# Patient Record
Sex: Male | Born: 1935 | Race: White | Hispanic: No | Marital: Married | Smoking: Former smoker
Health system: Southern US, Community
[De-identification: ages and names within clinical notes are randomized; demographics above are authoritative.]

## PROBLEM LIST (undated history)

## (undated) DIAGNOSIS — E079 Disorder of thyroid, unspecified: Secondary | ICD-10-CM

## (undated) DIAGNOSIS — E538 Deficiency of other specified B group vitamins: Secondary | ICD-10-CM

## (undated) DIAGNOSIS — R269 Unspecified abnormalities of gait and mobility: Secondary | ICD-10-CM

## (undated) DIAGNOSIS — G20 Parkinson's disease: Secondary | ICD-10-CM

## (undated) DIAGNOSIS — M546 Pain in thoracic spine: Secondary | ICD-10-CM

## (undated) DIAGNOSIS — M810 Age-related osteoporosis without current pathological fracture: Secondary | ICD-10-CM

## (undated) DIAGNOSIS — M25519 Pain in unspecified shoulder: Secondary | ICD-10-CM

## (undated) DIAGNOSIS — K5792 Diverticulitis of intestine, part unspecified, without perforation or abscess without bleeding: Secondary | ICD-10-CM

## (undated) DIAGNOSIS — M25561 Pain in right knee: Secondary | ICD-10-CM

## (undated) DIAGNOSIS — E785 Hyperlipidemia, unspecified: Secondary | ICD-10-CM

## (undated) DIAGNOSIS — F32A Depression, unspecified: Secondary | ICD-10-CM

## (undated) DIAGNOSIS — M542 Cervicalgia: Secondary | ICD-10-CM

## (undated) DIAGNOSIS — G2581 Restless legs syndrome: Secondary | ICD-10-CM

## (undated) DIAGNOSIS — G20C Parkinsonism, unspecified: Secondary | ICD-10-CM

## (undated) DIAGNOSIS — M1990 Unspecified osteoarthritis, unspecified site: Secondary | ICD-10-CM

## (undated) DIAGNOSIS — F329 Major depressive disorder, single episode, unspecified: Secondary | ICD-10-CM

## (undated) DIAGNOSIS — E559 Vitamin D deficiency, unspecified: Secondary | ICD-10-CM

## (undated) DIAGNOSIS — R413 Other amnesia: Secondary | ICD-10-CM

## (undated) DIAGNOSIS — I452 Bifascicular block: Secondary | ICD-10-CM

## (undated) DIAGNOSIS — R296 Repeated falls: Secondary | ICD-10-CM

## (undated) DIAGNOSIS — M545 Low back pain, unspecified: Secondary | ICD-10-CM

## (undated) DIAGNOSIS — I10 Essential (primary) hypertension: Secondary | ICD-10-CM

## (undated) DIAGNOSIS — R1310 Dysphagia, unspecified: Secondary | ICD-10-CM

## (undated) DIAGNOSIS — E039 Hypothyroidism, unspecified: Secondary | ICD-10-CM

## (undated) DIAGNOSIS — E1143 Type 2 diabetes mellitus with diabetic autonomic (poly)neuropathy: Secondary | ICD-10-CM

## (undated) DIAGNOSIS — R42 Dizziness and giddiness: Secondary | ICD-10-CM

## (undated) DIAGNOSIS — H9193 Unspecified hearing loss, bilateral: Secondary | ICD-10-CM

## (undated) HISTORY — DX: Low back pain, unspecified: M54.50

## (undated) HISTORY — DX: Cervicalgia: M54.2

## (undated) HISTORY — DX: Pain in unspecified shoulder: M25.519

## (undated) HISTORY — DX: Essential (primary) hypertension: I10

## (undated) HISTORY — DX: Diverticulitis of intestine, part unspecified, without perforation or abscess without bleeding: K57.92

## (undated) HISTORY — DX: Dizziness and giddiness: R42

## (undated) HISTORY — DX: Hyperlipidemia, unspecified: E78.5

## (undated) HISTORY — DX: Unspecified osteoarthritis, unspecified site: M19.90

## (undated) HISTORY — DX: Parkinsonism, unspecified: G20.C

## (undated) HISTORY — DX: Major depressive disorder, single episode, unspecified: F32.9

## (undated) HISTORY — DX: Low back pain: M54.5

## (undated) HISTORY — DX: Hypothyroidism, unspecified: E03.9

## (undated) HISTORY — DX: Restless legs syndrome: G25.81

## (undated) HISTORY — DX: Parkinson's disease: G20

## (undated) HISTORY — DX: Unspecified abnormalities of gait and mobility: R26.9

## (undated) HISTORY — DX: Repeated falls: R29.6

## (undated) HISTORY — DX: Deficiency of other specified B group vitamins: E53.8

## (undated) HISTORY — DX: Type 2 diabetes mellitus with diabetic autonomic (poly)neuropathy: E11.43

## (undated) HISTORY — DX: Unspecified hearing loss, bilateral: H91.93

## (undated) HISTORY — DX: Dysphagia, unspecified: R13.10

## (undated) HISTORY — DX: Pain in right knee: M25.561

## (undated) HISTORY — DX: Depression, unspecified: F32.A

## (undated) HISTORY — DX: Pain in thoracic spine: M54.6

## (undated) HISTORY — DX: Other amnesia: R41.3

---

## 2004-01-10 ENCOUNTER — Ambulatory Visit: Payer: Self-pay | Admitting: Family Medicine

## 2004-09-11 ENCOUNTER — Ambulatory Visit: Payer: Self-pay | Admitting: Family Medicine

## 2004-09-25 ENCOUNTER — Ambulatory Visit: Payer: Self-pay | Admitting: Family Medicine

## 2004-11-04 ENCOUNTER — Ambulatory Visit: Payer: Self-pay | Admitting: Family Medicine

## 2004-11-25 ENCOUNTER — Ambulatory Visit: Payer: Self-pay | Admitting: Family Medicine

## 2005-03-04 ENCOUNTER — Ambulatory Visit: Payer: Self-pay | Admitting: Family Medicine

## 2005-03-11 ENCOUNTER — Ambulatory Visit: Payer: Self-pay | Admitting: Family Medicine

## 2005-06-10 ENCOUNTER — Ambulatory Visit: Payer: Self-pay | Admitting: Family Medicine

## 2005-09-30 ENCOUNTER — Ambulatory Visit: Payer: Self-pay | Admitting: Family Medicine

## 2005-11-25 ENCOUNTER — Ambulatory Visit: Payer: Self-pay | Admitting: Family Medicine

## 2005-12-17 ENCOUNTER — Ambulatory Visit: Payer: Self-pay | Admitting: Family Medicine

## 2006-12-13 ENCOUNTER — Telehealth: Payer: Self-pay | Admitting: Family Medicine

## 2006-12-16 ENCOUNTER — Ambulatory Visit: Payer: Self-pay | Admitting: Family Medicine

## 2006-12-16 DIAGNOSIS — T50995A Adverse effect of other drugs, medicaments and biological substances, initial encounter: Secondary | ICD-10-CM

## 2006-12-16 DIAGNOSIS — F528 Other sexual dysfunction not due to a substance or known physiological condition: Secondary | ICD-10-CM

## 2006-12-16 DIAGNOSIS — N390 Urinary tract infection, site not specified: Secondary | ICD-10-CM

## 2006-12-16 DIAGNOSIS — I10 Essential (primary) hypertension: Secondary | ICD-10-CM

## 2006-12-16 DIAGNOSIS — E119 Type 2 diabetes mellitus without complications: Secondary | ICD-10-CM | POA: Insufficient documentation

## 2006-12-16 DIAGNOSIS — D649 Anemia, unspecified: Secondary | ICD-10-CM

## 2006-12-16 DIAGNOSIS — M19019 Primary osteoarthritis, unspecified shoulder: Secondary | ICD-10-CM | POA: Insufficient documentation

## 2006-12-16 LAB — CONVERTED CEMR LAB
Nitrite: NEGATIVE
Specific Gravity, Urine: 1.02

## 2006-12-21 LAB — CONVERTED CEMR LAB
AST: 23 units/L (ref 0–37)
Alkaline Phosphatase: 70 units/L (ref 39–117)
Basophils Absolute: 0 10*3/uL (ref 0.0–0.1)
Basophils Relative: 0.2 % (ref 0.0–1.0)
Bilirubin, Direct: 0.2 mg/dL (ref 0.0–0.3)
CO2: 31 meq/L (ref 19–32)
Chloride: 102 meq/L (ref 96–112)
Creatinine, Ser: 0.7 mg/dL (ref 0.4–1.5)
Eosinophils Absolute: 0.2 10*3/uL (ref 0.0–0.6)
GFR calc Af Amer: 142 mL/min
HCT: 46.6 % (ref 39.0–52.0)
LDL Cholesterol: 74 mg/dL (ref 0–99)
Lymphocytes Relative: 34.5 % (ref 12.0–46.0)
MCHC: 34.9 g/dL (ref 30.0–36.0)
Monocytes Absolute: 0.9 10*3/uL — ABNORMAL HIGH (ref 0.2–0.7)
Monocytes Relative: 9.5 % (ref 3.0–11.0)
PSA: 0.4 ng/mL (ref 0.10–4.00)
Platelets: 243 10*3/uL (ref 150–400)
Potassium: 3.5 meq/L (ref 3.5–5.1)
Sodium: 142 meq/L (ref 135–145)
TSH: 1.35 microintl units/mL (ref 0.35–5.50)
Triglycerides: 157 mg/dL — ABNORMAL HIGH (ref 0–149)
VLDL: 31 mg/dL (ref 0–40)

## 2006-12-29 ENCOUNTER — Ambulatory Visit: Payer: Self-pay | Admitting: Family Medicine

## 2006-12-30 ENCOUNTER — Encounter: Payer: Self-pay | Admitting: Family Medicine

## 2007-02-22 ENCOUNTER — Telehealth: Payer: Self-pay | Admitting: Family Medicine

## 2007-02-23 ENCOUNTER — Telehealth (INDEPENDENT_AMBULATORY_CARE_PROVIDER_SITE_OTHER): Payer: Self-pay | Admitting: *Deleted

## 2007-05-18 ENCOUNTER — Ambulatory Visit: Payer: Self-pay | Admitting: Family Medicine

## 2007-05-18 DIAGNOSIS — S339XXA Sprain of unspecified parts of lumbar spine and pelvis, initial encounter: Secondary | ICD-10-CM | POA: Insufficient documentation

## 2007-05-18 DIAGNOSIS — N410 Acute prostatitis: Secondary | ICD-10-CM | POA: Insufficient documentation

## 2007-05-18 DIAGNOSIS — S335XXA Sprain of ligaments of lumbar spine, initial encounter: Secondary | ICD-10-CM

## 2007-05-18 LAB — CONVERTED CEMR LAB
Nitrite: NEGATIVE
Urobilinogen, UA: 1

## 2007-06-01 ENCOUNTER — Ambulatory Visit: Payer: Self-pay | Admitting: Family Medicine

## 2007-06-01 DIAGNOSIS — M62838 Other muscle spasm: Secondary | ICD-10-CM

## 2007-06-01 DIAGNOSIS — Z8719 Personal history of other diseases of the digestive system: Secondary | ICD-10-CM | POA: Insufficient documentation

## 2007-06-01 LAB — CONVERTED CEMR LAB
Ketones, urine, test strip: NEGATIVE
Protein, U semiquant: NEGATIVE
Urobilinogen, UA: 0.2
WBC Urine, dipstick: NEGATIVE

## 2007-12-22 ENCOUNTER — Ambulatory Visit: Payer: Self-pay | Admitting: Family Medicine

## 2007-12-22 DIAGNOSIS — E559 Vitamin D deficiency, unspecified: Secondary | ICD-10-CM | POA: Insufficient documentation

## 2008-01-04 ENCOUNTER — Ambulatory Visit: Payer: Self-pay | Admitting: Family Medicine

## 2008-01-04 LAB — CONVERTED CEMR LAB: OCCULT 2: NEGATIVE

## 2008-01-06 ENCOUNTER — Encounter: Payer: Self-pay | Admitting: Family Medicine

## 2008-01-30 ENCOUNTER — Telehealth: Payer: Self-pay | Admitting: Family Medicine

## 2008-02-27 ENCOUNTER — Ambulatory Visit: Payer: Self-pay | Admitting: Family Medicine

## 2008-02-27 LAB — CONVERTED CEMR LAB: Hgb A1c MFr Bld: 6.6 % — ABNORMAL HIGH (ref 4.6–6.0)

## 2008-03-13 ENCOUNTER — Telehealth: Payer: Self-pay | Admitting: Family Medicine

## 2008-04-24 LAB — CONVERTED CEMR LAB: Vit D, 1,25-Dihydroxy: 41 (ref 30–89)

## 2008-10-10 ENCOUNTER — Encounter: Payer: Self-pay | Admitting: Family Medicine

## 2008-12-25 ENCOUNTER — Ambulatory Visit: Payer: Self-pay | Admitting: Family Medicine

## 2008-12-25 DIAGNOSIS — N400 Enlarged prostate without lower urinary tract symptoms: Secondary | ICD-10-CM

## 2008-12-25 DIAGNOSIS — M1710 Unilateral primary osteoarthritis, unspecified knee: Secondary | ICD-10-CM

## 2008-12-25 LAB — CONVERTED CEMR LAB
Glucose, Urine, Semiquant: NEGATIVE
Ketones, urine, test strip: NEGATIVE
Nitrite: NEGATIVE
Protein, U semiquant: NEGATIVE
Urobilinogen, UA: 0.2
WBC Urine, dipstick: NEGATIVE

## 2008-12-31 LAB — CONVERTED CEMR LAB
AST: 24 units/L (ref 0–37)
Albumin: 4.6 g/dL (ref 3.5–5.2)
Alkaline Phosphatase: 61 units/L (ref 39–117)
BUN: 16 mg/dL (ref 6–23)
Calcium: 9.8 mg/dL (ref 8.4–10.5)
Creatinine, Ser: 0.9 mg/dL (ref 0.4–1.5)
HCT: 46 % (ref 39.0–52.0)
HDL: 38.4 mg/dL — ABNORMAL LOW (ref >39.00)
Hemoglobin: 16.3 g/dL (ref 13.0–17.0)
Hgb A1c MFr Bld: 6.7 % — ABNORMAL HIGH (ref 4.6–6.5)
Lymphocytes Relative: 30.7 % (ref 12.0–46.0)
MCHC: 35.3 g/dL (ref 30.0–36.0)
MCV: 96.2 fL (ref 78.0–100.0)
Monocytes Absolute: 0.6 10*3/uL (ref 0.1–1.0)
Monocytes Relative: 9 % (ref 3.0–12.0)
Neutro Abs: 3.9 10*3/uL (ref 1.4–7.7)
Neutrophils Relative %: 58.2 % (ref 43.0–77.0)
PSA: 0.75 ng/mL (ref 0.10–4.00)
Platelets: 207 10*3/uL (ref 150.0–400.0)
Potassium: 3.9 meq/L (ref 3.5–5.1)
RBC: 4.78 M/uL (ref 4.22–5.81)
Sodium: 141 meq/L (ref 135–145)
TSH: 1.26 microintl units/mL (ref 0.35–5.50)
Total CHOL/HDL Ratio: 3
Total Protein: 7.6 g/dL (ref 6.0–8.3)
Triglycerides: 148 mg/dL (ref 0.0–149.0)
VLDL: 29.6 mg/dL (ref 0.0–40.0)

## 2009-01-14 ENCOUNTER — Ambulatory Visit: Payer: Self-pay | Admitting: Family Medicine

## 2009-01-14 LAB — CONVERTED CEMR LAB
OCCULT 2: NEGATIVE
OCCULT 3: NEGATIVE

## 2009-01-30 ENCOUNTER — Encounter: Payer: Self-pay | Admitting: Family Medicine

## 2009-03-21 ENCOUNTER — Ambulatory Visit: Payer: Self-pay | Admitting: Family Medicine

## 2009-03-21 DIAGNOSIS — L259 Unspecified contact dermatitis, unspecified cause: Secondary | ICD-10-CM | POA: Insufficient documentation

## 2009-03-21 LAB — CONVERTED CEMR LAB

## 2009-03-27 ENCOUNTER — Telehealth: Payer: Self-pay | Admitting: Family Medicine

## 2009-04-11 ENCOUNTER — Ambulatory Visit: Payer: Self-pay | Admitting: Family Medicine

## 2009-04-11 DIAGNOSIS — R269 Unspecified abnormalities of gait and mobility: Secondary | ICD-10-CM

## 2009-04-11 DIAGNOSIS — R413 Other amnesia: Secondary | ICD-10-CM | POA: Insufficient documentation

## 2009-04-11 LAB — CONVERTED CEMR LAB: Hemoglobin: 18.2 g/dL

## 2009-04-14 LAB — CONVERTED CEMR LAB
Albumin: 4.1 g/dL (ref 3.5–5.2)
BUN: 20 mg/dL (ref 6–23)
CO2: 30 meq/L (ref 19–32)
Calcium: 9.8 mg/dL (ref 8.4–10.5)
Chloride: 103 meq/L (ref 96–112)
Creatinine, Ser: 0.9 mg/dL (ref 0.4–1.5)
GFR calc non Af Amer: 87.26 mL/min (ref >60)
Sodium: 140 meq/L (ref 135–145)

## 2009-04-18 ENCOUNTER — Encounter: Admission: RE | Admit: 2009-04-18 | Discharge: 2009-04-18 | Payer: Self-pay | Admitting: Family Medicine

## 2009-04-24 ENCOUNTER — Telehealth: Payer: Self-pay | Admitting: Family Medicine

## 2009-05-08 ENCOUNTER — Ambulatory Visit: Payer: Self-pay | Admitting: Family Medicine

## 2009-05-08 ENCOUNTER — Telehealth: Payer: Self-pay | Admitting: Family Medicine

## 2009-05-08 DIAGNOSIS — R351 Nocturia: Secondary | ICD-10-CM

## 2009-05-30 ENCOUNTER — Telehealth: Payer: Self-pay | Admitting: Family Medicine

## 2009-06-13 ENCOUNTER — Encounter: Payer: Self-pay | Admitting: Family Medicine

## 2009-06-25 ENCOUNTER — Encounter: Payer: Self-pay | Admitting: Family Medicine

## 2009-06-25 ENCOUNTER — Encounter: Admission: RE | Admit: 2009-06-25 | Discharge: 2009-09-05 | Payer: Self-pay | Admitting: Neurology

## 2009-06-26 ENCOUNTER — Encounter: Admission: RE | Admit: 2009-06-26 | Discharge: 2009-06-26 | Payer: Self-pay | Admitting: Neurology

## 2009-07-08 ENCOUNTER — Encounter: Payer: Self-pay | Admitting: Family Medicine

## 2009-08-01 ENCOUNTER — Encounter: Admission: RE | Admit: 2009-08-01 | Discharge: 2009-08-01 | Payer: Self-pay | Admitting: Neurology

## 2009-08-13 ENCOUNTER — Ambulatory Visit (HOSPITAL_COMMUNITY): Admission: RE | Admit: 2009-08-13 | Discharge: 2009-08-13 | Payer: Self-pay | Admitting: Neurology

## 2009-09-05 ENCOUNTER — Encounter: Payer: Self-pay | Admitting: Family Medicine

## 2010-01-20 ENCOUNTER — Ambulatory Visit: Payer: Self-pay | Admitting: Family Medicine

## 2010-01-20 LAB — CONVERTED CEMR LAB: HDL goal, serum: 40 mg/dL

## 2010-01-21 ENCOUNTER — Encounter: Payer: Self-pay | Admitting: Family Medicine

## 2010-01-21 LAB — CONVERTED CEMR LAB
ALT: 19 units/L (ref 0–53)
AST: 22 units/L (ref 0–37)
Albumin: 4.3 g/dL (ref 3.5–5.2)
Bilirubin, Direct: 0.2 mg/dL (ref 0.0–0.3)
Chloride: 103 meq/L (ref 96–112)
GFR calc non Af Amer: 116.38 mL/min (ref >60)
Glucose, Bld: 132 mg/dL — ABNORMAL HIGH (ref 70–99)
Hgb A1c MFr Bld: 6.6 % — ABNORMAL HIGH (ref 4.6–6.5)
LDL Cholesterol: 57 mg/dL (ref 0–99)
Sodium: 143 meq/L (ref 135–145)
Total CHOL/HDL Ratio: 3
Total Protein: 6.9 g/dL (ref 6.0–8.3)
VLDL: 19 mg/dL (ref 0.0–40.0)

## 2010-02-23 HISTORY — PX: CATARACT EXTRACTION: SUR2

## 2010-03-23 LAB — CONVERTED CEMR LAB
Albumin: 4.2 g/dL (ref 3.5–5.2)
Alkaline Phosphatase: 67 units/L (ref 39–117)
Basophils Absolute: 0 10*3/uL (ref 0.0–0.1)
Basophils Relative: 0 % (ref 0.0–3.0)
Bilirubin, Direct: 0.1 mg/dL (ref 0.0–0.3)
CO2: 29 meq/L (ref 19–32)
Calcium: 9.4 mg/dL (ref 8.4–10.5)
Chloride: 102 meq/L (ref 96–112)
Cholesterol: 123 mg/dL (ref 0–200)
Creatinine, Ser: 0.8 mg/dL (ref 0.4–1.5)
Eosinophils Absolute: 0.1 10*3/uL (ref 0.0–0.7)
GFR calc Af Amer: 122 mL/min
GFR calc non Af Amer: 100 mL/min
Glucose, Bld: 160 mg/dL — ABNORMAL HIGH (ref 70–99)
Glucose, Urine, Semiquant: NEGATIVE
HCT: 46.8 % (ref 39.0–52.0)
Hemoglobin: 16.3 g/dL (ref 13.0–17.0)
LDL Cholesterol: 55 mg/dL (ref 0–99)
Lymphocytes Relative: 27.5 % (ref 12.0–46.0)
MCV: 93.6 fL (ref 78.0–100.0)
Monocytes Absolute: 0.6 10*3/uL (ref 0.1–1.0)
Neutrophils Relative %: 63.2 % (ref 43.0–77.0)
Platelets: 193 10*3/uL (ref 150–400)
Protein, U semiquant: NEGATIVE
Specific Gravity, Urine: 1.02
Triglycerides: 180 mg/dL — ABNORMAL HIGH (ref 0–149)
Urobilinogen, UA: 0.2
WBC Urine, dipstick: NEGATIVE
WBC: 7.6 10*3/uL (ref 4.5–10.5)
pH: 5

## 2010-03-25 NOTE — Progress Notes (Signed)
Summary: Pt called re: lowering blood pressure med as discussed  Phone Note Call from Patient Call back at Home Phone [phone]   Caller: Patient Summary of Call: Pt just wanted to call and remind Dr. Scotty Court to lower blood pressure med, as discussed in ov on 05/08/09.    Initial call taken by: Lucy Antigua,  May 08, 2009 2:44 PM  Follow-up for Phone Call        called by dr Scotty Court.  Follow-up by: Pura Spice, RN,  May 08, 2009 4:24 PM

## 2010-03-25 NOTE — Letter (Signed)
Summary: Eye Exam/[facility] Exam/[facility]   Imported By: Maryln Gottron 07/25/2009 14:09:18  _____________________________________________________________________  External Attachment:    Type:   Image     Comment:   External Document

## 2010-03-25 NOTE — Progress Notes (Signed)
Summary: mri results   Phone Note Call from Patient   Caller: Spouse Summary of Call: wife Nicholas Barnett called and stated [place] had mri brain last week and not heard back with results. pls call  Initial call taken by: Pura Spice, RN,  April 24, 2009 2:39 PM  Follow-up for Phone Call        called pt with results

## 2010-03-25 NOTE — Assessment & Plan Note (Signed)
Summary: rash worse/dm   Vital Signs:  Patient profile:   77 year old male Weight:      208 pounds O2 Sat:      95 % Temp:     98 degrees F Pulse rate:   90 / minute BP sitting:   102 / 72  (left arm) Cuff size:   regular  Vitals Entered By: Pura Spice, RN (April 11, 2009 4:35 PM) CC: rash cin under neck top of head  no energy Is Patient Diabetic? No   History of Present Illness: This 78 year old white male was seen 125 with eczema and also nocturia and was treated for one eczema as well as give him Flomax for the nocturia. Since last visit the patient's rash improved somewhat but not completely continues under his neck and burns. Apparently the rash has not changed significantly since the last visit according to the patient. Since last visit patient has noticed he has been less energetic and has some problem at once and 3-D or just sitting. His wife has some concern about loss of memory as well as some unsteady gait diabetes under good control, CBGs 80-110 Hypertension is stable  Allergies (verified): No Known Drug Allergies  Past History:  Past Medical History: Last updated: 12/25/2008 Diabetes mellitus, type II Hyperlipidemia Hypertension Diverticulitis, hx of arthritis shoulders Arthritis right knee  Review of Systems      See HPI  The patient denies anorexia, fever, weight loss, weight gain, vision loss, decreased hearing, hoarseness, chest pain, syncope, dyspnea on exertion, peripheral edema, prolonged cough, headaches, hemoptysis, abdominal pain, melena, hematochezia, severe indigestion/heartburn, hematuria, incontinence, genital sores, muscle weakness, suspicious skin lesions, transient blindness, difficulty walking, depression, unusual weight change, abnormal bleeding, enlarged lymph nodes, angioedema, breast masses, and testicular masses.    Physical Exam  General:  Well-developed,well-nourished,in no acute distress; alert,appropriate and cooperative  throughout examination Head:  Normocephalic and atraumatic without obvious abnormalities. No apparent alopecia or balding. Eyes:  No corneal or conjunctival inflammation noted. EOMI. Perrla. Funduscopic exam benign, without hemorrhages, exudates or papilledema. Vision grossly normal. Ears:  External ear exam shows no significant lesions or deformities.  Otoscopic examination reveals clear canals, tympanic membranes are intact bilaterally without bulging, retraction, inflammation or discharge. Hearing is grossly normal bilaterally. Nose:  External nasal examination shows no deformity or inflammation. Nasal mucosa are pink and moist without lesions or exudates. Mouth:  Oral mucosa and oropharynx without lesions or exudates.  Teeth in good repair. Neck:  erythematous scaly rash on her neck Chest Wall:  No deformities, masses, tenderness or gynecomastia noted. Lungs:  Normal respiratory effort, chest expands symmetrically. Lungs are clear to auscultation, no crackles or wheezes. Heart:  Normal rate and regular rhythm. S1 and S2 normal without gallop, murmur, click, rub or other extra sounds. Msk:  No deformity or scoliosis noted of thoracic or lumbar spine.   Extremities:  No clubbing, cyanosis, edema, or deformity noted with normal full range of motion of all joints.   Neurologic:  gait appears essentially normal but some question as to his being unsteady, Romberg test negative reflexes normal and equal bilaterally Babinski sign absent Skin:  her family scale her rash on her neck Cervical Nodes:  No lymphadenopathy noted Axillary Nodes:  No palpable lymphadenopathy Psych:  Cognition and judgment appear intact. Alert and cooperative with normal attention span and concentration. No apparent delusions, illusions, hallucinations   Impression & Recommendations:  Problem # 1:  SOMNOLENCE (ICD-780.09) Assessment New  Orders: Radiology Referral (  Radiology)  Problem # 2:  MEMORY LOSS  (ICD-780.93) Assessment: New  Orders: Radiology Referral (Radiology)  Problem # 3:  GAIT IMBALANCE (ICD-781.2) Assessment: New  Orders: TLB-Renal Function Panel (80069-RENAL) Radiology Referral (Radiology)  Problem # 4:  ECZEMA (ZOX-096.9) Assessment: Unchanged  His updated medication list for this problem includes:    Prednisone 10 Mg Tabs (Prednisone) .Marland Kitchen... 1 tidpc for 3 daysthen 1 two times a day for 6 days yhen 1 qd    Hydrocortisone 2.5 % Crea (Hydrocortisone) .Marland Kitchen... Apply to rash bid  Problem # 5:  DIABETES MELLITUS, TYPE II (ICD-250.00) Assessment: Improved  His updated medication list for this problem includes:    Metformin Hcl 1000 Mg Tabs (Metformin hcl) .Marland Kitchen... 1 two times a day for diabetes    Glipizide Xl 10 Mg Tb24 (Glipizide) .Marland Kitchen... Take  2 every day    Adult Aspirin Low Strength 81 Mg Tbdp (Aspirin) ..... Once daily    Benazepril Hcl 20 Mg Tabs (Benazepril hcl) .Marland Kitchen... 1 once daily for blood pressure  Problem # 6:  HYPERTENSION (ICD-401.9) Assessment: Improved  His updated medication list for this problem includes:    Hydrochlorothiazide 50 Mg Tabs (Hydrochlorothiazide) .Marland Kitchen... 1 qam for bp and edema    Amlodipine Besylate 10 Mg Tabs (Amlodipine besylate) .Marland Kitchen... 1 once daily for blood pressure    Benazepril Hcl 20 Mg Tabs (Benazepril hcl) .Marland Kitchen... 1 once daily for blood pressure  Problem # 7:  HYPERTROPHY PROSTATE W/UR OBST & OTH LUTS (ICD-600.01) Assessment: Unchanged  Complete Medication List: 1)  Metformin Hcl 1000 Mg Tabs (Metformin hcl) .Marland Kitchen.. 1 two times a day for diabetes 2)  Glipizide Xl 10 Mg Tb24 (Glipizide) .... Take  2 every day 3)  Simvastatin 40 Mg Tabs (Simvastatin) .... Once daily  in the evening 4)  Adult Aspirin Low Strength 81 Mg Tbdp (Aspirin) .... Once daily 5)  Freestyle Test Strp (Glucose blood) .... Check once a day 6)  Freestyle Lancets Misc (Lancets) .... Test once a day 7)  Hydrochlorothiazide 50 Mg Tabs (Hydrochlorothiazide) .Marland Kitchen.. 1 qam for  bp and edema 8)  Klor-con M20 20 Meq Cr-tabs (Potassium chloride crys cr) .Marland Kitchen.. 1 by mouth once daily 9)  Flomax 0.4 Mg Caps (Tamsulosin hcl) .Marland Kitchen.. 1 each day to prevent urinary frequency and nocturia 10)  Hydroxyzine Hcl 25 Mg Tabs (Hydroxyzine hcl) .Marland Kitchen.. 1 morn, midafternoon and hs to prevent itching 11)  Amlodipine Besylate 10 Mg Tabs (Amlodipine besylate) .Marland Kitchen.. 1 once daily for blood pressure 12)  Benazepril Hcl 20 Mg Tabs (Benazepril hcl) .Marland Kitchen.. 1 once daily for blood pressure 13)  Calcium-carb 600 + D 600-125 Mg-unit Tabs (Calcium-vitamin d) 14)  Prednisone 10 Mg Tabs (Prednisone) .Marland Kitchen.. 1 tidpc for 3 daysthen 1 two times a day for 6 days yhen 1 qd 15)  Hydrocortisone 2.5 % Crea (Hydrocortisone) .... Apply to rash bid  Other Orders: Hgb (04540)  Patient Instructions: 1)  due to the new neurological changes we will plan to get a CT scan with and without contrast. 2)  Also will treat her with prednisone for the rash 3)  Will call results of labs and radiological studies were viable Prescriptions: HYDROCORTISONE 2.5 % CREA (HYDROCORTISONE) apply to rash bid  #30 gm x 1   Entered and Authorized by:   Judithann Sheen MD   Signed by:   Judithann Sheen MD on 04/11/2009   Method used:   Electronically to        [facility] [address] (retail)       [address]       [place], [place]  [address]       Ph: [phone]       Fax: [phone]   RxID:   [id number] PREDNISONE 10 MG TABS (PREDNISONE) 1 tidpc for 3 daysthen 1 two times a day for 6 days yhen 1 qd  #30 x 0   Entered and Authorized by:   Judithann Sheen MD   Signed by:   Judithann Sheen MD on 04/11/2009   Method used:   Electronically to        [facility] (retail)       [address]       [place], [place]  [address]       Ph: [phone]       Fax: [phone]   RxID:   [id number]   Laboratory Results   Blood Tests    Date/Time Recieved: April 11, 2009 5:51 PM  Date/Time Reported: April 11, 2009 5:51 PM    CBC HGB:  18.2 g/dL   (Normal Range: 27.0-35.0 in Males, 12.0-15.0 in Females) Comments: Wynona Canes, CMA  April 11, 2009 5:51 PM

## 2010-03-25 NOTE — Consult Note (Signed)
Summary: [facility]  [facility]   Imported By: Maryln Gottron 06/21/2009 15:07:38  _____________________________________________________________________  External Attachment:    Type:   Image     Comment:   External Document

## 2010-03-25 NOTE — Assessment & Plan Note (Signed)
Summary: fu on dm/njr   Vital Signs:  Patient profile:   77 year old male Weight:      209 pounds O2 Sat:      96 % Temp:     98 degrees F Pulse rate:   72 / minute BP sitting:   132 / 82  (left arm)  Vitals Entered By: Pura Spice, RN (May 08, 2009 11:00 AM) CC: ?about changing diabetic meds and c/o dizziness and nervous all time    History of Present Illness: December 77-year-old white male is in for return visit following problems of dizziness from change of position that has been chronic but no nausea. He is in to get a hemoglobin A1c to check on his diabetic control appeared the patient has been somnolent, had unsteady gait he has had no associated nausea with his dizziness. His wife and daughters were concerned about his tremor, and attention, a 20 isn't attempted due to the period When he checked his blood pressure home it is in the range of 90-95/60.  It is at this time but that he is sometimes dizzy On the last visit we did an MRI without contrast and it revealed small vessel changes but no other problems CBGs at home about 140-148 The rash he had on previous visit is gone patient complains of urinary urgency and incontinence at times as well as nocturia he had stopped the Flomax which was controlling the problem but after stopping Flomax if problems increased in severity   Allergies (verified): No Known Drug Allergies  Past History:  Past Medical History: Last updated: 12/25/2008 Diabetes mellitus, type II Hyperlipidemia Hypertension Diverticulitis, hx of arthritis shoulders Arthritis right knee  Review of Systems      See HPI General:  Complains of weakness; somnolence. Eyes:  Denies blurring, discharge, double vision, eye irritation, eye pain, halos, itching, light sensitivity, red eye, vision loss-1 eye, and vision loss-both eyes; cataract left eye. ENT:  Denies decreased hearing, difficulty swallowing, ear discharge, earache, hoarseness, nasal congestion,  nosebleeds, postnasal drainage, ringing in ears, sinus pressure, and sore throat. Resp:  Denies chest discomfort, chest pain with inspiration, cough, coughing up blood, excessive snoring, hypersomnolence, morning headaches, pleuritic, shortness of breath, sputum productive, and wheezing. GI:  Denies abdominal pain, bloody stools, change in bowel habits, constipation, dark tarry stools, diarrhea, excessive appetite, gas, hemorrhoids, indigestion, loss of appetite, nausea, vomiting, vomiting blood, and yellowish skin color. GU:  Complains of urinary frequency; urinary urgency. MS:  Complains of joint pain. Derm:  See HPI; Denies changes in color of skin, changes in nail beds, dryness, excessive perspiration, flushing, hair loss, insect bite(s), itching, lesion(s), poor wound healing, and rash. Neuro:  Complains of memory loss and poor balance.  Physical Exam  General:  appears healthy but has a flat facial expression Head:  Normocephalic and atraumatic without obvious abnormalities. No apparent alopecia or balding. Eyes:  No corneal or conjunctival inflammation noted. EOMI. Perrla. Funduscopic exam benign, without hemorrhages, exudates or papilledema. Vision grossly normal. Ears:  hearing aids bilaterally otherwise negative Nose:  External nasal examination shows no deformity or inflammation. Nasal mucosa are pink and moist without lesions or exudates. Mouth:  Oral mucosa and oropharynx without lesions or exudates.  Teeth in good repair. Neck:  No deformities, masses, or tenderness noted. Lungs:  Normal respiratory effort, chest expands symmetrically. Lungs are clear to auscultation, no crackles or wheezes. Heart:  Normal rate and regular rhythm. S1 and S2 normal without gallop, murmur, click, rub  or other extra sounds. Abdomen:  Bowel sounds positive,abdomen soft and non-tender without masses, organomegaly or hernias noted. Rectal:  No external abnormalities noted. Normal sphincter tone. No rectal  masses or tenderness. Prostate:  no nodules, no asymmetry, and 1+ enlarged.   Msk:  No deformity or scoliosis noted of thoracic or lumbar spine.   Pulses:  R and L carotid,radial,femoral,dorsalis pedis and posterior tibial pulses are full and equal bilaterally Extremities:  No clubbing, cyanosis, edema, or deformity noted with normal full range of motion of all joints.   Neurologic:  unsteady gait, intention tremor bilaterally Skin:  Intact without suspicious lesions or rashes Cervical Nodes:  No lymphadenopathy noted Axillary Nodes:  No palpable lymphadenopathy Inguinal Nodes:  No significant adenopathy Psych:  Cognition and judgment appear intact. Alert and cooperative with normal attention span and concentration. No apparent delusions, illusions, hallucinations   Impression & Recommendations:  Problem # 1:  SOMNOLENCE (ICD-780.09) Assessment Unchanged  Orders: Neurology Referral (Neuro)  Problem # 2:  MEMORY LOSS (ICD-780.93) Assessment: Unchanged  Orders: Neurology Referral (Neuro)  Problem # 3:  GAIT IMBALANCE (ICD-781.2) Assessment: Unchanged  Orders: Neurology Referral (Neuro)  Problem # 4:  HYPERTROPHY PROSTATE W/UR OBST & OTH LUTS (ICD-600.01) Assessment: Deteriorated VESIcare 5 mg q.d.  Problem # 5:  DIABETES MELLITUS, TYPE II (ICD-250.00) Assessment: Improved  His updated medication list for this problem includes:    Metformin Hcl 1000 Mg Tabs (Metformin hcl) .Marland Kitchen... 1 two times a day for diabetes    Glipizide Xl 10 Mg Tb24 (Glipizide) .Marland Kitchen... Take  2 every day    Adult Aspirin Low Strength 81 Mg Tbdp (Aspirin) ..... Once daily    Benazepril Hcl 20 Mg Tabs (Benazepril hcl) .Marland Kitchen... 1 once daily for blood pressure  Orders: Venipuncture (16109) TLB-A1C / Hgb A1C (Glycohemoglobin) (83036-A1C)  Problem # 6:  NOCTURIA (UEA-540.98) Assessment: Unchanged  Complete Medication List: 1)  Metformin Hcl 1000 Mg Tabs (Metformin hcl) .Marland Kitchen.. 1 two times a day for diabetes 2)   Glipizide Xl 10 Mg Tb24 (Glipizide) .... Take  2 every day 3)  Simvastatin 40 Mg Tabs (Simvastatin) .... Once daily  in the evening 4)  Adult Aspirin Low Strength 81 Mg Tbdp (Aspirin) .... Once daily 5)  Freestyle Test Strp (Glucose blood) .... Check once a day 6)  Freestyle Lancets Misc (Lancets) .... Test once a day 7)  Hydrochlorothiazide 50 Mg Tabs (Hydrochlorothiazide) .Marland Kitchen.. 1 qam for bp and edema 8)  Klor-con M20 20 Meq Cr-tabs (Potassium chloride crys cr) .Marland Kitchen.. 1 by mouth once daily 9)  Amlodipine Besylate 10 Mg Tabs (Amlodipine besylate) .Marland Kitchen.. 1 once daily for blood pressure 10)  Benazepril Hcl 20 Mg Tabs (Benazepril hcl) .Marland Kitchen.. 1 once daily for blood pressure 11)  Calcium-carb 600 + D 600-125 Mg-unit Tabs (Calcium-vitamin d) 12)  Prednisone 10 Mg Tabs (Prednisone) .Marland Kitchen.. 1 tidpc for 3 daysthen 1 two times a day for 6 days yhen 1 qd 13)  Hydrocortisone 2.5 % Crea (Hydrocortisone) .... Apply to rash bid 14)  Vesicare 5 Mg Tabs (Solifenacin succinate) .... One q.d. for urinary urgency  Other Orders: UA Dipstick w/Micro (automated) (81001)  Patient Instructions: 1)  tO REFER TO nEUROLOGIST , will call 2)  Take Flomax 1 each day 3)  vesicare 5mg  1 each day to help to prevent urinary urgent 4)  continue other medications 5)  decrease amlodipine to 5 mg q. day

## 2010-03-25 NOTE — Progress Notes (Signed)
Summary: Vesicare  rx   Phone Note Call from Patient   Caller: Patient Call For: Nicholas Sheen MD Summary of Call: Pt would like [facility] called to [place] [place], [place]) [phone] Initial call taken by: Lynann Beaver CMA,  May 30, 2009 1:59 PM  Follow-up for Phone Call        ok called  Follow-up by: Pura Spice, RN,  May 30, 2009 2:06 PM    Prescriptions: VESICARE 5 MG TABS (SOLIFENACIN SUCCINATE) one q.d. for urinary urgency  #30 x 00   Entered by:   Pura Spice, RN   Authorized by:   Nicholas Sheen MD   Signed by:   Pura Spice, RN on 05/30/2009   Method used:   Electronically to        [facility].* (retail)       [address]       [place], [place]  [address]       Ph: [phone]       Fax: [phone]   RxID:   [id number]

## 2010-03-25 NOTE — Letter (Signed)
Summary: [facility]B12 Injection  [facility]   Imported By: Maryln Gottron 10/22/2009 09:55:31  _____________________________________________________________________  External Attachment:    Type:   Image     Comment:   External Document

## 2010-03-25 NOTE — Assessment & Plan Note (Signed)
Summary: rash/dm   Vital Signs:  Patient profile:   77 year old male Weight:      209 pounds O2 Sat:      96 % Temp:     98 degrees F oral Resp:     16 per minute BP sitting:   132 / 80  Vitals Entered By: Lynann Beaver CMA (March 21, 2009 11:59 AM) CC: rash Is Patient Diabetic? Yes   History of Present Illness: this 77 year old white male hypertensive patient complains of a pruritic rash that has been present over the past 3-4 weeks and in fact had been present since the weather turned cold. No history of allergies Ration and erythematous and scaly aggravated by hot showers. CBG in running 90-120 Blood pressure stable Continues to have some arthritic pains but is active complaint of urinary frequency but also nocturia 3-5 times per night, VESIcare did not have any Y.  Allergies: No Known Drug Allergies  Past History:  Past Medical History: Last updated: 12/25/2008 Diabetes mellitus, type II Hyperlipidemia Hypertension Diverticulitis, hx of arthritis shoulders Arthritis right knee  Risk Factors: Smoking Status: quit (06/01/2007)  Review of Systems  The patient denies anorexia, fever, weight loss, weight gain, vision loss, decreased hearing, hoarseness, chest pain, syncope, dyspnea on exertion, peripheral edema, prolonged cough, headaches, hemoptysis, abdominal pain, melena, hematochezia, severe indigestion/heartburn, hematuria, incontinence, genital sores, muscle weakness, suspicious skin lesions, transient blindness, difficulty walking, depression, unusual weight change, abnormal bleeding, enlarged lymph nodes, angioedema, breast masses, and testicular masses.    Physical Exam  General:  Well-developed,well-nourished,in no acute distress; alert,appropriate and cooperative throughout examination Lungs:  Normal respiratory effort, chest expands symmetrically. Lungs are clear to auscultation, no crackles or wheezes. Heart:  Normal rate and regular rhythm. S1 and S2  normal without gallop, murmur, click, rub or other extra sounds. Abdomen:  Bowel sounds positive,abdomen soft and non-tender without masses, organomegaly or hernias noted. Rectal:  No external abnormalities noted. Normal sphincter tone. No rectal masses or tenderness. Prostate:  no nodules, no asymmetry, and 1+ enlarged.   Msk:  No deformity or scoliosis noted of thoracic or lumbar spine.   Pulses:  R and L carotid,radial,femoral,dorsalis pedis and posterior tibial pulses are full and equal bilaterally Extremities:  No clubbing, cyanosis, edema, or deformity noted with normal full range of motion of all joints.   Skin:  erythematous scaly rash compatible with when her eczema, his a generalized rash, non-vesicular   Complete Medication List: 1)  Lotrel 10-20 Mg Caps (Amlodipine besy-benazepril hcl) .... Once daily for blood pressure 2)  Metformin Hcl 1000 Mg Tabs (Metformin hcl) .Marland Kitchen.. 1 two times a day for diabetes 3)  Glipizide Xl 10 Mg Tb24 (Glipizide) .... Take  2 every day 4)  Simvastatin 40 Mg Tabs (Simvastatin) .... Once daily  in the evening 5)  Adult Aspirin Low Strength 81 Mg Tbdp (Aspirin) .... Once daily 6)  Freestyle Test Strp (Glucose blood) .... Check once a day 7)  Freestyle Lancets Misc (Lancets) .... Test once a day 8)  Hydrochlorothiazide 50 Mg Tabs (Hydrochlorothiazide) .Marland Kitchen.. 1 qam for bp and edema 9)  Klor-con M20 20 Meq Cr-tabs (Potassium chloride crys cr) .Marland Kitchen.. 1 by mouth once daily 10)  Flomax 0.4 Mg Caps (Tamsulosin hcl) .Marland Kitchen.. 1 each day to prevent urinary frequency and nocturia 11)  Hydroxyzine Hcl 25 Mg Tabs (Hydroxyzine hcl) .Marland Kitchen.. 1 morn, midafternoon and hs to prevent itching  Other Orders: UA Dipstick w/Micro (automated) (81001) Depo- Medrol 40mg  (J1030) Depo- Medrol  80mg  (J1040) Prescription Created Electronically [id number])  Patient Instructions: 1)  continue with her rashes when her eczema and she was found to the Depo-Medrol as well as the hydroxyzine and get  Sarna lotion and apply to relieve the itching also. Discontinue any hot baths 2)  I have prescribed Flomax to help with the nocturia 3)  urinalysis was negative for infection Prescriptions: HYDROXYZINE HCL 25 MG TABS (HYDROXYZINE HCL) 1 MORN, MIDAFTERNOON AND HS TO PREVENT ITCHING  #90 x 6   Entered and Authorized by:   Judithann Sheen MD   Signed by:   Judithann Sheen MD on 03/21/2009   Method used:   Electronically to        [facility] (retail)       [address]       [place], [place]  [address]       Ph: [phone]       Fax: [phone]   RxID:   [id number] FLOMAX 0.4 MG CAPS (TAMSULOSIN HCL) 1 EACH DAY TO PREVENT URINARY FREQUENCY AND NOCTURIA  #30 x 11   Entered and Authorized by:   Judithann Sheen MD   Signed by:   Judithann Sheen MD on 03/21/2009   Method used:   Electronically to        [facility].* (retail)       [address]       [place], [place]  [address]       Ph: [phone]       Fax: [phone]   RxID:   [id number]   Laboratory Results   Urine Tests    Routine Urinalysis   Color: yellow Appearance: Clear Glucose: negative   (Normal Range: Negative) Bilirubin: 1+   (Normal Range: Negative) Ketone: trace (5)   (Normal Range: Negative) Spec. Gravity: 1.025   (Normal Range: 1.003-1.035) Blood: 1+   (Normal Range: Negative) pH: 5.0   (Normal Range: 5.0-8.0) Protein: trace   (Normal Range: Negative) Urobilinogen: 0.2   (Normal Range: 0-1) Nitrite: negative   (Normal Range: Negative) Leukocyte Esterace: negative   (Normal Range: Negative)    Comments: Rita Ohara  March 21, 2009 1:17 PM       Medication Administration  Injection # 1:    Medication: Depo- Medrol 80mg     Diagnosis: ECZEMA (ICD-692.9)    Route: IM    Site: RUOQ gluteus    Exp Date: 12/2011    Lot #: [id number]    Mfr: Pharmacia    Patient tolerated injection without  complications    Given by: Judithann Sheen MD (March 22, 2009 5:13 PM)  Injection # 2:    Medication: Depo- Medrol 40mg     Diagnosis: ECZEMA (ICD-692.9)    Route: IM    Site: RUOQ gluteus    Exp Date: 732202    Lot #: [id number]    Mfr: Pharmacia    Patient tolerated injection without complications    Given by: Judithann Sheen MD (March 22, 2009 5:15 PM)  Orders Added: 1)  UA Dipstick w/Micro (automated) [81001] 2)  Depo- Medrol 40mg  [J1030] 3)  Depo- Medrol 80mg  [J1040] 4)  Prescription Created Electronically [G8553] 5)  Est. Patient Level IV [62831]

## 2010-03-25 NOTE — Assessment & Plan Note (Signed)
Summary: MEDICATION REVIEW (NOT A CPX) OK PER SUANDREA // RS   Vital Signs:  Patient profile:   77 year old male Weight:      205 pounds Temp:     97.9 degrees F oral BP sitting:   130 / 78  (left arm) Cuff size:   regular  Vitals Entered By: Sid Falcon LPN (January 20, 2010 8:36 AM)  History of Present Illness: Patient seen for multiple medical problem follow up. He has history of hypertension, type 2 diabetes, hyperlipidemia, and history of BPH.  Still has some occasional nocturia. No significant obstructive symptoms. Needs refills of tamsulosin. Has taken VESIcare in the past for urine frequency and that seemed to worsen his problem.  No major obstructive symptoms.  Hyperlipidemia. Takes simvastatin 40 mg as well as amlodipine but no myalgias. Has been on this regimen for several years. Compliant with therapy.  Type 2 diabetes. Blood sugars fasting usually 70 to 90s. Regular eye exams. No hypoglycemia.  Hypertension well controlled. Meds reviewed and compliant with all.  Diabetes Management History:      He has not been enrolled in the "Diabetic Education Program".  He states understanding of dietary principles and is following his diet appropriately.  No sensory loss is reported.  Self foot exams are being performed.  He is checking home blood sugars.  He says that he is not exercising regularly.        Hypoglycemic symptoms are not occurring.  No hyperglycemic symptoms are reported.        There are no symptoms to suggest diabetic complications.    Hypertension History:      He denies headache, chest pain, palpitations, dyspnea with exertion, orthopnea, PND, peripheral edema, visual symptoms, neurologic problems, syncope, and side effects from treatment.        Positive major cardiovascular risk factors include male age 64 years old or older, diabetes, hyperlipidemia, and hypertension.  Negative major cardiovascular risk factors include non-tobacco-user status.        Further  assessment for target organ damage reveals no history of ASHD, stroke/TIA, or peripheral vascular disease.    Lipid Management History:      Positive NCEP/ATP III risk factors include male age 63 years old or older, diabetes, HDL cholesterol less than 40, and hypertension.  Negative NCEP/ATP III risk factors include non-tobacco-user status, no ASHD (atherosclerotic heart disease), no prior stroke/TIA, no peripheral vascular disease, and no history of aortic aneurysm.      Preventive Screening-Counseling & Management  Caffeine-Diet-Exercise     Does Patient Exercise: no  Allergies (verified): No Known Drug Allergies  Past History:  Past Medical History: Last updated: 12/25/2008 Diabetes mellitus, type II Hyperlipidemia Hypertension Diverticulitis, hx of arthritis shoulders Arthritis right knee  Risk Factors: Exercise: no (01/20/2010)  Risk Factors: Smoking Status: quit (06/01/2007)  Social History: Does Patient Exercise:  no  Review of Systems      See HPI  Physical Exam  General:  Well-developed,well-nourished,in no acute distress; alert,appropriate and cooperative throughout examination Ears:  External ear exam shows no significant lesions or deformities.  Otoscopic examination reveals clear canals, tympanic membranes are intact bilaterally without bulging, retraction, inflammation or discharge. Hearing is grossly normal bilaterally. Mouth:  Oral mucosa and oropharynx without lesions or exudates.  Teeth in good repair. Neck:  No deformities, masses, or tenderness noted. Lungs:  Normal respiratory effort, chest expands symmetrically. Lungs are clear to auscultation, no crackles or wheezes. Heart:  normal rate and regular rhythm.  Extremities:  trace edema both legs.  Diabetes Management Exam:    Foot Exam (with socks and/or shoes not present):       Sensory-Pinprick/Light touch:          Left medial foot (L-4): diminished          Left dorsal foot (L-5):  diminished          Left lateral foot (S-1): diminished          Right medial foot (L-4): diminished          Right dorsal foot (L-5): diminished          Right lateral foot (S-1): diminished       Sensory-Monofilament:          Left foot: diminished          Right foot: diminished       Inspection:          Left foot: normal          Right foot: normal       Nails:          Left foot: normal          Right foot: normal    Eye Exam:       Eye Exam done elsewhere          Date: 03/27/2009          Results: normal          Done by: dr Hazle Quant   Impression & Recommendations:  Problem # 1:  DIABETES MELLITUS, TYPE II (ICD-250.00)  His updated medication list for this problem includes:    Metformin Hcl 1000 Mg Tabs (Metformin hcl) .Marland Kitchen... 1 two times a day for diabetes    Glipizide Xl 10 Mg Tb24 (Glipizide) .Marland Kitchen... Take  2 every day    Adult Aspirin Low Strength 81 Mg Tbdp (Aspirin) ..... Once daily    Benazepril Hcl 20 Mg Tabs (Benazepril hcl) .Marland Kitchen... 1 once daily for blood pressure  Orders: Venipuncture (40347) Specimen Handling (42595) TLB-A1C / Hgb A1C (Glycohemoglobin) (83036-A1C)  Problem # 2:  HYPERTENSION (ICD-401.9)  His updated medication list for this problem includes:    Hydrochlorothiazide 50 Mg Tabs (Hydrochlorothiazide) .Marland Kitchen... 1 qam for bp and edema    Amlodipine Besylate 10 Mg Tabs (Amlodipine besylate) .Marland Kitchen... 1 once daily for blood pressure    Benazepril Hcl 20 Mg Tabs (Benazepril hcl) .Marland Kitchen... 1 once daily for blood pressure  Orders: Venipuncture (63875) Specimen Handling (64332) TLB-BMP (Basic Metabolic Panel-BMET) (80048-METABOL)  Problem # 3:  HYPERLIPIDEMIA (ICD-272.4)  His updated medication list for this problem includes:    Simvastatin 40 Mg Tabs (Simvastatin) ..... Once daily  in the evening  Orders: Venipuncture (95188) Specimen Handling (41660) TLB-Lipid Panel (80061-LIPID) TLB-Hepatic/Liver Function Pnl (80076-HEPATIC)  Problem # 4:  HYPERTROPHY  PROSTATE W/UR OBST & OTH LUTS (ICD-600.01)  Complete Medication List: 1)  Metformin Hcl 1000 Mg Tabs (Metformin hcl) .Marland Kitchen.. 1 two times a day for diabetes 2)  Glipizide Xl 10 Mg Tb24 (Glipizide) .... Take  2 every day 3)  Simvastatin 40 Mg Tabs (Simvastatin) .... Once daily  in the evening 4)  Adult Aspirin Low Strength 81 Mg Tbdp (Aspirin) .... Once daily 5)  Freestyle Test Strp (Glucose blood) .... Check once a day 6)  Freestyle Lancets Misc (Lancets) .... Test once a day 7)  Hydrochlorothiazide 50 Mg Tabs (Hydrochlorothiazide) .Marland Kitchen.. 1 qam for bp and edema 8)  Klor-con M20 20 Meq Cr-tabs (  Potassium chloride crys cr) .Marland Kitchen.. 1 by mouth once daily 9)  Amlodipine Besylate 10 Mg Tabs (Amlodipine besylate) .Marland Kitchen.. 1 once daily for blood pressure 10)  Benazepril Hcl 20 Mg Tabs (Benazepril hcl) .Marland Kitchen.. 1 once daily for blood pressure 11)  Calcium-carb 600 + D 600-125 Mg-unit Tabs (Calcium-vitamin d) 12)  Hydrocortisone 2.5 % Crea (Hydrocortisone) .... Apply to rash two times a day as needed 13)  Tamsulosin Hcl 0.4 Mg Caps (Tamsulosin hcl) .... One tab at bedtime 14)  Cyanocobalamin 1000 Mcg/ml Soln (Cyanocobalamin) .... Once a month injection 1 ml.  Diabetes Management Assessment/Plan:      The following lipid goals have been established for the patient: Total cholesterol goal of 200; LDL cholesterol goal of 100; HDL cholesterol goal of 40; Triglyceride goal of 150.    Hypertension Assessment/Plan:      The patient's hypertensive risk group is category C: Target organ damage and/or diabetes.  His calculated 10 year risk of coronary heart disease is 22 %.  Today's blood pressure is 130/78.    Lipid Assessment/Plan:      Based on NCEP/ATP III, the patient's risk factor category is "history of diabetes".  The patient's lipid goals are as follows: Total cholesterol goal is 200; LDL cholesterol goal is 100; HDL cholesterol goal is 40; Triglyceride goal is 150.    Patient Instructions: 1)  Please schedule a  follow-up appointment in 6 months .  2)  Check your blood sugars regularly. If your readings are usually above:  or below 70 you should contact our office.  3)  It is important that your diabetic A1c level is checked every 3 months.  4)  See your eye doctor yearly to check for diabetic eye damage. 5)  Check your feet each night  for sore areas, calluses or signs of infection.  Prescriptions: TAMSULOSIN HCL 0.4 MG CAPS (TAMSULOSIN HCL) one tab at bedtime  #30 x 5   Entered and Authorized by:   Evelena Peat MD   Signed by:   Evelena Peat MD on 01/20/2010   Method used:   Electronically to        [facility].* (retail)       [address]       [place], [place]  [address]       Ph: [phone]       Fax: [phone]   RxID:   [id number]    Orders Added: 1)  Est. Patient Level IV [01779] 2)  Venipuncture [39030] 3)  Specimen Handling [99000] 4)  TLB-Lipid Panel [80061-LIPID] 5)  TLB-Hepatic/Liver Function Pnl [80076-HEPATIC] 6)  TLB-BMP (Basic Metabolic Panel-BMET) [80048-METABOL] 7)  TLB-A1C / Hgb A1C (Glycohemoglobin) [83036-A1C]

## 2010-03-25 NOTE — Letter (Signed)
Summary: [facility]  [facility]   Imported By: Maryln Gottron 09/16/2009 11:21:57  _____________________________________________________________________  External Attachment:    Type:   Image     Comment:   External Document

## 2010-03-25 NOTE — Progress Notes (Signed)
Summary: med subs to reduce costs  Phone Note Call from Patient Call back at Home Phone [phone]   Summary of Call: Have talked to pharmacist  & he can give the two meds suggested by insurance to reduce costs.  Need Rxs for amlodipine besy & benazepril HCT instead of the amlod/benazp 10-20 mg caps one daily.  Needs 90 day supply and refills for both to [facility], [phone]. Initial call taken by: Rudy Jew, RN,  March 27, 2009 2:19 PM  Follow-up for Phone Call        to change rx as requested

## 2010-03-27 NOTE — Consult Note (Signed)
Summary: [facility]  [facility]   Imported By: Lennie Odor 02/11/2010 11:46:25  _____________________________________________________________________  External Attachment:    Type:   Image     Comment:   External Document  Appended Document: [facility] reviewed appears vitamin B12 S. health and

## 2010-05-21 ENCOUNTER — Encounter: Payer: Self-pay | Admitting: Family Medicine

## 2010-06-10 ENCOUNTER — Encounter: Payer: Self-pay | Admitting: Family Medicine

## 2010-06-10 ENCOUNTER — Ambulatory Visit (INDEPENDENT_AMBULATORY_CARE_PROVIDER_SITE_OTHER): Payer: PRIVATE HEALTH INSURANCE | Admitting: Family Medicine

## 2010-06-10 DIAGNOSIS — D649 Anemia, unspecified: Secondary | ICD-10-CM

## 2010-06-10 DIAGNOSIS — M7550 Bursitis of unspecified shoulder: Secondary | ICD-10-CM

## 2010-06-10 DIAGNOSIS — M719 Bursopathy, unspecified: Secondary | ICD-10-CM

## 2010-06-10 DIAGNOSIS — E559 Vitamin D deficiency, unspecified: Secondary | ICD-10-CM

## 2010-06-10 DIAGNOSIS — I10 Essential (primary) hypertension: Secondary | ICD-10-CM

## 2010-06-10 DIAGNOSIS — G589 Mononeuropathy, unspecified: Secondary | ICD-10-CM

## 2010-06-10 DIAGNOSIS — N138 Other obstructive and reflux uropathy: Secondary | ICD-10-CM

## 2010-06-10 DIAGNOSIS — E119 Type 2 diabetes mellitus without complications: Secondary | ICD-10-CM

## 2010-06-10 DIAGNOSIS — N401 Enlarged prostate with lower urinary tract symptoms: Secondary | ICD-10-CM

## 2010-06-10 DIAGNOSIS — G20 Parkinson's disease: Secondary | ICD-10-CM

## 2010-06-10 DIAGNOSIS — N139 Obstructive and reflux uropathy, unspecified: Secondary | ICD-10-CM

## 2010-06-10 DIAGNOSIS — E039 Hypothyroidism, unspecified: Secondary | ICD-10-CM

## 2010-06-10 DIAGNOSIS — E785 Hyperlipidemia, unspecified: Secondary | ICD-10-CM

## 2010-06-10 DIAGNOSIS — R351 Nocturia: Secondary | ICD-10-CM

## 2010-06-10 DIAGNOSIS — G629 Polyneuropathy, unspecified: Secondary | ICD-10-CM

## 2010-06-10 LAB — CBC WITH DIFFERENTIAL/PLATELET
Basophils Absolute: 0 10*3/uL (ref 0.0–0.1)
HCT: 48.3 % (ref 39.0–52.0)
Hemoglobin: 16.6 g/dL (ref 13.0–17.0)
Lymphs Abs: 2.2 10*3/uL (ref 0.7–4.0)
MCHC: 34.4 g/dL (ref 30.0–36.0)
MCV: 95.3 fl (ref 78.0–100.0)
Monocytes Absolute: 0.7 10*3/uL (ref 0.1–1.0)
Monocytes Relative: 7.4 % (ref 3.0–12.0)
Neutro Abs: 5.9 10*3/uL (ref 1.4–7.7)
RDW: 13.7 % (ref 11.5–14.6)

## 2010-06-10 LAB — BASIC METABOLIC PANEL
BUN: 16 mg/dL (ref 6–23)
CO2: 31 mEq/L (ref 19–32)
Chloride: 101 mEq/L (ref 96–112)
Creatinine, Ser: 0.8 mg/dL (ref 0.4–1.5)
GFR: 94.2 mL/min (ref >60.00)
Glucose, Bld: 106 mg/dL — ABNORMAL HIGH (ref 70–99)

## 2010-06-10 LAB — TSH: TSH: 1.19 u[IU]/mL (ref 0.35–5.50)

## 2010-06-10 LAB — LIPID PANEL
Cholesterol: 127 mg/dL (ref 0–200)
HDL: 35 mg/dL — ABNORMAL LOW (ref >39.00)
Total CHOL/HDL Ratio: 4
Triglycerides: 197 mg/dL — ABNORMAL HIGH (ref 0.0–149.0)

## 2010-06-10 LAB — HEPATIC FUNCTION PANEL
Albumin: 3.9 g/dL (ref 3.5–5.2)
Total Protein: 6.8 g/dL (ref 6.0–8.3)

## 2010-06-10 LAB — POCT URINALYSIS DIPSTICK
Glucose, UA: NEGATIVE
Spec Grav, UA: 1.02
Urobilinogen, UA: 0.2
pH, UA: 5.5

## 2010-06-10 MED ORDER — METHYLPREDNISOLONE ACETATE 80 MG/ML IJ SUSP
120.0000 mg | Freq: Once | INTRAMUSCULAR | Status: AC
  Administered 2010-06-10: 120 mg via INTRAMUSCULAR

## 2010-06-11 ENCOUNTER — Telehealth: Payer: Self-pay | Admitting: Family Medicine

## 2010-06-11 LAB — VITAMIN D 25 HYDROXY (VIT D DEFICIENCY, FRACTURES): Vit D, 25-Hydroxy: 51 ng/mL (ref 30–89)

## 2010-06-11 NOTE — Telephone Encounter (Signed)
Saw Dr Scotty Court on yesterday and his rxs were to be sent to pharmacy. Please send them to [facility] in [place]----ph---[phone].

## 2010-06-12 MED ORDER — BENAZEPRIL HCL 20 MG PO TABS: 20.0000 mg | ORAL_TABLET | Freq: Every day | ORAL | Status: DC

## 2010-06-12 MED ORDER — SIMVASTATIN 40 MG PO TABS: 40.0000 mg | ORAL_TABLET | Freq: Every day | ORAL | Status: DC

## 2010-06-12 MED ORDER — HYDROCHLOROTHIAZIDE 50 MG PO TABS: 50.0000 mg | ORAL_TABLET | Freq: Every day | ORAL | Status: DC

## 2010-06-12 MED ORDER — AMLODIPINE BESYLATE 10 MG PO TABS: 10.0000 mg | ORAL_TABLET | Freq: Every day | ORAL | Status: DC

## 2010-06-12 MED ORDER — PREDNISONE 10 MG PO TABS: ORAL_TABLET | ORAL | Status: DC

## 2010-06-12 MED ORDER — TAMSULOSIN HCL 0.4 MG PO CAPS: 0.4000 mg | ORAL_CAPSULE | Freq: Every day | ORAL | Status: DC

## 2010-06-12 MED ORDER — DUTASTERIDE 0.5 MG PO CAPS: 0.5000 mg | ORAL_CAPSULE | Freq: Every day | ORAL | Status: AC

## 2010-06-12 MED ORDER — METFORMIN HCL 1000 MG PO TABS: 1000.0000 mg | ORAL_TABLET | Freq: Two times a day (BID) | ORAL | Status: DC

## 2010-06-12 MED ORDER — POTASSIUM CHLORIDE CRYS ER 20 MEQ PO TBCR: 20.0000 meq | EXTENDED_RELEASE_TABLET | Freq: Every day | ORAL | Status: DC

## 2010-06-12 MED ORDER — GLIPIZIDE ER 10 MG PO TB24: 10.0000 mg | ORAL_TABLET | Freq: Every day | ORAL | Status: DC

## 2010-06-13 ENCOUNTER — Encounter: Payer: Self-pay | Admitting: Family Medicine

## 2010-06-13 NOTE — Patient Instructions (Signed)
In Gen. A thirty-eight over one oh except for your major complaint severe continue seeing Dr. Sandria Manly  regarding parkinsonism We'll refill your medications We'll call regarding lab studies For the urinary symptoms as well as the enlarged prostate we will start Avodart and also continue Flomax Continue same treatment for diabetes which is metformin EKG revealed no acute problem

## 2010-06-13 NOTE — Progress Notes (Signed)
  Subjective:    Patient ID: Nicholas Barnett, male    DOB: Sep 27, 1931, 77 y.o.   MRN: [id number] This 77 year old white married male with parkinsonism and diabetes another problems he didn't did discuss his multiple medical problems as well as his current complaints. No increased urinary frequency and nocturia 6-7 times per night he is on flomax now. His other main complaint is that of pain in his right shoulder his head bursitis and problems of both shoulders for years but more acutely in the right shoulder lately. He also has some pain in the cervical spine. He has had some problems swallowing and has had a swallowing test done at [place] along to teach him what type of foods to eat . Felbamate and that he does have urinary incontinence at times. CBGs were in the range of 80-120 As the problem was insomnia and would like something to help him sleep after we help resolve the nocturia Hypertension control 132/80 today usually 120 or less HPI    Review of Systems see history of present illness     Objective:   Physical Exam vital signs are normal. The patient is a well-developed well-nourished white male who has appearance of a person with parkinsonism slight tremor of his head HEENT otherwise are normal including carotid pulses are good no bruits as well as thyroid nonpalpable  Lungs clear to palpation percussion and auscultation no rales were heardHeart no evidence of cardiomegaly heart sounds are good without murmur peripheral pulsations are good and equal bilaterally left cardiogram no acute problem Abdomen the liver spleen and kidneys nonpalpable no masses felt no tenderness bowel sounds normal Genitalia normal Rectal examination reveals prostate to be enlarged x2 no nodules no tenderness Extremity examination negative except examination of the right shoulder entire shoulder tender over the joint no point tenderness pain on movement same findings with the left shoulder but not as  severe Neurological unsteady gait age cane did pan facial expression 1+ tremor both hands bilaterallyPatient is alert cooperative Skin examination negative         Assessment & Plan:  Diabetes mellitus2 100 and controlled no change Hypertension well controlled no change Benign prostatic purchase a has increased in size x2 to start Avodart and continue Flomax Deltoid bursitis and arthritis both shoulders he treated with prednisone Dysphagia, change diet Insomnia treated with Restoril 15-30 mg h.s.

## 2010-06-16 ENCOUNTER — Telehealth: Payer: Self-pay

## 2010-06-16 NOTE — Telephone Encounter (Signed)
Message copied by Earle Gell on Mon Jun 16, 2010 11:40 AM ------      Message from: Harvie Heck.      Created: Thu Jun 12, 2010  6:19 PM       All lab studies are good, cholesterol 127 very good, tsh good, psa normal, cbc normal,      It d very good

## 2010-06-16 NOTE — Telephone Encounter (Signed)
Pt is aware of lab results but would like to know if b12 was taken at that time

## 2010-06-19 NOTE — Telephone Encounter (Signed)
Done

## 2010-06-23 ENCOUNTER — Other Ambulatory Visit (INDEPENDENT_AMBULATORY_CARE_PROVIDER_SITE_OTHER): Payer: PRIVATE HEALTH INSURANCE | Admitting: Family Medicine

## 2010-06-23 DIAGNOSIS — K921 Melena: Secondary | ICD-10-CM

## 2010-06-23 DIAGNOSIS — IMO0001 Reserved for inherently not codable concepts without codable children: Secondary | ICD-10-CM

## 2010-06-23 LAB — HEMOCCULT GUIAC POC 1CARD (OFFICE)
Card #2 Fecal Occult Blod, POC: NEGATIVE
Fecal Occult Blood, POC: NEGATIVE

## 2010-06-26 NOTE — Telephone Encounter (Signed)
b12 was taken at that time but lab results have not come back at this time.  Pt will be notified when lab results are back

## 2010-06-27 NOTE — Progress Notes (Signed)
Quick Note:  Called and pt is aware of lab results ______ 

## 2010-06-27 NOTE — Progress Notes (Signed)
Quick Note:  Called and pt is aware of lab results ______

## 2010-07-25 ENCOUNTER — Other Ambulatory Visit: Payer: Self-pay | Admitting: Neurology

## 2010-07-25 DIAGNOSIS — R269 Unspecified abnormalities of gait and mobility: Secondary | ICD-10-CM

## 2010-07-31 ENCOUNTER — Ambulatory Visit
Admission: RE | Admit: 2010-07-31 | Discharge: 2010-07-31 | Disposition: A | Discharge status: Home / Self Care | Payer: PRIVATE HEALTH INSURANCE | Source: Ambulatory Visit | Attending: Neurology | Admitting: Neurology

## 2010-07-31 DIAGNOSIS — R269 Unspecified abnormalities of gait and mobility: Secondary | ICD-10-CM

## 2010-10-15 ENCOUNTER — Ambulatory Visit (INDEPENDENT_AMBULATORY_CARE_PROVIDER_SITE_OTHER): Payer: PRIVATE HEALTH INSURANCE | Admitting: Family Medicine

## 2010-10-15 ENCOUNTER — Encounter: Payer: Self-pay | Admitting: Family Medicine

## 2010-10-15 VITALS — BP 118/68 | HR 81 | Temp 97.7°F | Wt 203.0 lb

## 2010-10-15 DIAGNOSIS — N400 Enlarged prostate without lower urinary tract symptoms: Secondary | ICD-10-CM

## 2010-10-15 DIAGNOSIS — G20 Parkinson's disease: Secondary | ICD-10-CM

## 2010-10-15 DIAGNOSIS — E119 Type 2 diabetes mellitus without complications: Secondary | ICD-10-CM

## 2010-10-15 DIAGNOSIS — M67919 Unspecified disorder of synovium and tendon, unspecified shoulder: Secondary | ICD-10-CM

## 2010-10-15 DIAGNOSIS — M7552 Bursitis of left shoulder: Secondary | ICD-10-CM

## 2010-10-15 DIAGNOSIS — F329 Major depressive disorder, single episode, unspecified: Secondary | ICD-10-CM

## 2010-10-15 DIAGNOSIS — M19019 Primary osteoarthritis, unspecified shoulder: Secondary | ICD-10-CM

## 2010-10-15 DIAGNOSIS — E538 Deficiency of other specified B group vitamins: Secondary | ICD-10-CM

## 2010-10-15 DIAGNOSIS — M7551 Bursitis of right shoulder: Secondary | ICD-10-CM

## 2010-10-15 NOTE — Patient Instructions (Addendum)
Bilateral bursitis shoulders plus arthritis., depomedrol injections both shouders Peripheral edema as well  to take hydrochlorothiazide 50 mg one in the morning and one after lunch  after the swelling has gone down return to  1 and then take 2 a day when needed For neuropathy as well as depression start the new medicines Cymbalta 30 mg one each day for 7 days then take 60 mg each day at least one week before you run out call me and I will get more samples To check HgbA1c and Vit B12., will call results Intermezzo 1.75 dissolve under tongue as you go to bed Continue regular medications

## 2010-10-15 NOTE — Progress Notes (Signed)
  Subjective:    Patient ID: Nicholas Barnett, male    DOB: 03/22/1931, 77 y.o.   MRN: [id number] This 77 year old white married male with parkinsonism hypertension diabetes memory loss and chronic history of painful  shoulders with limited movement and pain on movement has past history of arthritis and bursitis both shoulders x-ray shows bone on bone .patient is somewhat depressed which is wife who is with him agrees his visit today is for the painful shoulders such prednisone by mouth did not help he would like injections if to get a hemoglobin A1c and B12 levels today HPI    Review of Systems see history present no     Objective:   Physical Exam the patient is a well-built well-nourished white male who appears depressed but in no distress Vital signs Normal Exal mination of the right shoulder reveals tenderness over the acromial clavicular joint as well as limited movement posteriorly as well as elevation or hyperextension there physical findings on the left shoulder but not as severe Lungs clear to palpation percussion and auscultation Heart no evidence cardiomegaly heart sounds are good regular rhythm no murmurs Neurological unsteady gait intention tremor  Extremities 2+ pretibial edema right leg and left leg         Assessment & Plan:  Acromioclavicular bursitis both shoulders severe arthritis both shoulders injected both a.c. bursa with Depo-Medrol 120 mg , lidocaine 1.5 cc 1% Peripheral edema hydrochlorothiazide 50 mg twice a day been decreased to 1 when improved Depression and pain to start Cymbalta 30 mg for one week then 60 mg each day Parkinsonism at this time patient is on no treatment could not tolerate initial treatment Hypertension continue same medication Diabetes under good control continue same medications check hemoglobin A1c today,

## 2010-10-17 ENCOUNTER — Encounter: Payer: Self-pay | Admitting: Family Medicine

## 2010-12-24 ENCOUNTER — Other Ambulatory Visit: Payer: Self-pay | Admitting: Family Medicine

## 2010-12-24 NOTE — Telephone Encounter (Signed)
Pt need more samples of cymbalta 60mg 

## 2010-12-25 ENCOUNTER — Telehealth: Payer: Self-pay | Admitting: Family Medicine

## 2010-12-25 NOTE — Telephone Encounter (Signed)
Pt is running out of samples for Cymbalta 60 mg. Pls call pt when samples are ready for pick up.

## 2010-12-25 NOTE — Telephone Encounter (Signed)
Called pt to make aware about samples.  No answering machine available. Will attempt to call pt at a later time.

## 2010-12-26 NOTE — Telephone Encounter (Signed)
Done

## 2010-12-26 NOTE — Telephone Encounter (Signed)
Spoke with pt's wife and she is aware that effective 12/25/10 that Dr. Scotty Court has retired and pt will have to re-establish with another physician.  Ok per Dr. Clent Ridges to give enough samples until pt can re-establish.

## 2011-03-31 ENCOUNTER — Encounter: Payer: Self-pay | Admitting: Internal Medicine

## 2011-03-31 ENCOUNTER — Ambulatory Visit (INDEPENDENT_AMBULATORY_CARE_PROVIDER_SITE_OTHER): Payer: Medicare Other | Admitting: Internal Medicine

## 2011-03-31 DIAGNOSIS — E785 Hyperlipidemia, unspecified: Secondary | ICD-10-CM

## 2011-03-31 DIAGNOSIS — I10 Essential (primary) hypertension: Secondary | ICD-10-CM

## 2011-03-31 DIAGNOSIS — R269 Unspecified abnormalities of gait and mobility: Secondary | ICD-10-CM

## 2011-03-31 DIAGNOSIS — E119 Type 2 diabetes mellitus without complications: Secondary | ICD-10-CM

## 2011-03-31 MED ORDER — BENAZEPRIL HCL 20 MG PO TABS: ORAL_TABLET | ORAL | Status: DC

## 2011-03-31 NOTE — Progress Notes (Signed)
Subjective:    Patient ID: Nicholas Barnett, male    DOB: May 30, 1931, 78 y.o.   MRN: [id number]  HPI  78 year old patient who is seen today to establish- Nicholas Barnett his long-time physician has retired. Medical problems include type 2 diabetes. Medicines were reviewed and apparently he is taking Glucotrol 20 mg daily. He is also on metformin therapy. He is also followed by Nicholas Barnett due to diabetic peripheral neuropathy and a parkinsonian syndrome. He has treated hypertension which includes hydrochlorothiazide 50 mg daily he is also on potassium supplementation. He has treated dyslipidemia. He remains quite sedentary and usually sleeps until noon. He has a history of BPH and is on Avodart and Flomax. He complains of some urinary incontinence.    Review of Systems  Constitutional: Negative for fever, chills, activity change, appetite change and fatigue.  HENT: Negative for hearing loss, ear pain, congestion, rhinorrhea, sneezing, mouth sores, trouble swallowing, neck pain, neck stiffness, dental problem, voice change, sinus pressure and tinnitus.   Eyes: Negative for photophobia, pain, redness and visual disturbance.  Respiratory: Negative for apnea, cough, choking, chest tightness, shortness of breath and wheezing.   Cardiovascular: Negative for chest pain, palpitations and leg swelling.  Gastrointestinal: Negative for nausea, vomiting, abdominal pain, diarrhea, constipation, blood in stool, abdominal distention, anal bleeding and rectal pain.  Genitourinary: Positive for difficulty urinating. Negative for dysuria, urgency, frequency, hematuria, flank pain, decreased urine volume, discharge, penile swelling, scrotal swelling, genital sores and testicular pain.  Musculoskeletal: Positive for gait problem. Negative for myalgias, back pain, joint swelling and arthralgias.  Skin: Negative for color change, rash and wound.  Neurological: Positive for weakness. Negative for dizziness, tremors,  seizures, syncope, facial asymmetry, speech difficulty, light-headedness, numbness and headaches.  Hematological: Negative for adenopathy. Does not bruise/bleed easily.  Psychiatric/Behavioral: Negative for suicidal ideas, hallucinations, behavioral problems, confusion, sleep disturbance, self-injury, dysphoric mood, decreased concentration and agitation. The patient is not nervous/anxious.        Objective:   Physical Exam  Constitutional: He appears well-developed and well-nourished.       Blood pressure 90/60 sitting  HENT:  Head: Normocephalic and atraumatic.  Right Ear: External ear normal.  Left Ear: External ear normal.  Nose: Nose normal.  Mouth/Throat: Oropharynx is clear and moist.  Eyes: Conjunctivae and EOM are normal. Pupils are equal, round, and reactive to light. No scleral icterus.  Neck: Normal range of motion. Neck supple. No JVD present. No thyromegaly present.  Cardiovascular: Regular rhythm, normal heart sounds and intact distal pulses.  Exam reveals no gallop and no friction rub.   No murmur heard. Pulmonary/Chest: Effort normal and breath sounds normal. He exhibits no tenderness.  Abdominal: Soft. Bowel sounds are normal. He exhibits no distension and no mass. There is no tenderness.  Genitourinary: Prostate normal and penis normal.  Musculoskeletal: Normal range of motion. He exhibits no edema and no tenderness.  Lymphadenopathy:    He has no cervical adenopathy.  Neurological: He is alert. He has normal reflexes. No cranial nerve deficit. Coordination normal.       Walks  slowly with a cane  Skin: Skin is warm and dry. No rash noted.  Psychiatric: He has a normal mood and affect. His behavior is normal.          Assessment & Plan:   Hypertension. Presently blood pressure is low and patient is at risk for orthostatic symptoms. We'll discontinue hydrochlorothiazide along with potassium supplementation Diabetes mellitus. Will decrease Glucotrol to 10 mg  daily which was the prescribed dose. We'll continue metformin and check a hemoglobin A1c Parkinsonian syndrome. Followup neurology Dyslipidemia we'll continue simvastatin 40 mg daily Urinary incontinence. Helpful to improve off diuretic therapy

## 2011-03-31 NOTE — Patient Instructions (Signed)
Discontinue hydrochlorothiazide Discontinue potassium chloride  Decrease Glucotrol to 10 mg once daily    It is important that you exercise regularly, at least 20 minutes 3 to 4 times per week.  If you develop chest pain or shortness of breath seek  medical attention.   Please check your hemoglobin A1c every 3 months

## 2011-04-01 ENCOUNTER — Telehealth: Payer: Self-pay

## 2011-04-01 NOTE — Telephone Encounter (Signed)
Pt's wife called back and wanted to know about reducing medications to 10 mg.

## 2011-04-02 NOTE — Telephone Encounter (Signed)
Pt's wife states she has clarified medication dosing by reading the AVS.

## 2011-05-25 ENCOUNTER — Other Ambulatory Visit: Payer: Self-pay | Admitting: Family Medicine

## 2011-06-29 ENCOUNTER — Ambulatory Visit: Payer: Medicare Other | Admitting: Internal Medicine

## 2011-06-30 ENCOUNTER — Encounter: Payer: Self-pay | Admitting: Internal Medicine

## 2011-06-30 ENCOUNTER — Ambulatory Visit (INDEPENDENT_AMBULATORY_CARE_PROVIDER_SITE_OTHER): Payer: Medicare Other | Admitting: Internal Medicine

## 2011-06-30 VITALS — BP 106/70 | Temp 97.8°F | Wt 209.0 lb

## 2011-06-30 DIAGNOSIS — D649 Anemia, unspecified: Secondary | ICD-10-CM

## 2011-06-30 DIAGNOSIS — I10 Essential (primary) hypertension: Secondary | ICD-10-CM

## 2011-06-30 DIAGNOSIS — E785 Hyperlipidemia, unspecified: Secondary | ICD-10-CM

## 2011-06-30 DIAGNOSIS — E119 Type 2 diabetes mellitus without complications: Secondary | ICD-10-CM

## 2011-06-30 DIAGNOSIS — M1710 Unilateral primary osteoarthritis, unspecified knee: Secondary | ICD-10-CM

## 2011-06-30 DIAGNOSIS — E039 Hypothyroidism, unspecified: Secondary | ICD-10-CM

## 2011-06-30 LAB — COMPREHENSIVE METABOLIC PANEL
ALT: 20 U/L (ref 0–53)
AST: 21 U/L (ref 0–37)
Creatinine, Ser: 0.8 mg/dL (ref 0.4–1.5)
GFR: 100.84 mL/min (ref >60.00)
Total Bilirubin: 0.7 mg/dL (ref 0.3–1.2)

## 2011-06-30 LAB — CBC WITH DIFFERENTIAL/PLATELET
Basophils Absolute: 0 10*3/uL (ref 0.0–0.1)
Basophils Relative: 0.5 % (ref 0.0–3.0)
HCT: 46 % (ref 39.0–52.0)
Hemoglobin: 15.1 g/dL (ref 13.0–17.0)
Lymphs Abs: 2.1 10*3/uL (ref 0.7–4.0)
MCHC: 32.8 g/dL (ref 30.0–36.0)
Monocytes Relative: 8 % (ref 3.0–12.0)
Neutro Abs: 4.6 10*3/uL (ref 1.4–7.7)
RDW: 13.7 % (ref 11.5–14.6)

## 2011-06-30 LAB — LIPID PANEL
HDL: 45.9 mg/dL (ref >39.00)
LDL Cholesterol: 50 mg/dL (ref 0–99)
Total CHOL/HDL Ratio: 3
Triglycerides: 116 mg/dL (ref 0.0–149.0)

## 2011-06-30 MED ORDER — METHYLPREDNISOLONE ACETATE 40 MG/ML IJ SUSP
40.0000 mg | Freq: Once | INTRAMUSCULAR | Status: AC
  Administered 2011-06-30: 40 mg via INTRAMUSCULAR

## 2011-06-30 NOTE — Progress Notes (Signed)
  Subjective:    Patient ID: Nicholas Barnett, male    DOB: 1931/05/28, 78 y.o.   MRN: [id number]  HPI  78 year old patient who is seen today for his quarterly followup. He has history of type 2 diabetes. He is followed by neurology due to Parkinson's disease and mild memory loss. He has dyslipidemia hypertension and osteoarthritis. Complaints today include urinary frequency and occasional incontinence he remains on simvastatin for dyslipidemia which he tolerates well. His diabetes remains under excellent control and hemoglobin A1c's have been 6.3 6.4 for the past year. Denies any cardiopulmonary complaints. He has had some mild insomnia which has been treated with melatonin with some success. He is requesting a cortisone shot today for some arthritic complaints. He states that this gives him some prolonged benefit and he has had no injections and over one year    Review of Systems  Constitutional: Negative for fever, chills, appetite change and fatigue.  HENT: Negative for hearing loss, ear pain, congestion, sore throat, trouble swallowing, neck stiffness, dental problem, voice change and tinnitus.   Eyes: Negative for pain, discharge and visual disturbance.  Respiratory: Negative for cough, chest tightness, wheezing and stridor.   Cardiovascular: Negative for chest pain, palpitations and leg swelling.  Gastrointestinal: Negative for nausea, vomiting, abdominal pain, diarrhea, constipation, blood in stool and abdominal distention.  Genitourinary: Negative for urgency, hematuria, flank pain, discharge, difficulty urinating and genital sores.  Musculoskeletal: Positive for arthralgias and gait problem. Negative for myalgias, back pain and joint swelling.  Skin: Negative for rash.  Neurological: Negative for dizziness, syncope, speech difficulty, weakness, numbness and headaches.  Hematological: Negative for adenopathy. Does not bruise/bleed easily.  Psychiatric/Behavioral: Negative for behavioral  problems and dysphoric mood. The patient is not nervous/anxious.        Objective:   Physical Exam  Constitutional: He is oriented to person, place, and time. He appears well-developed.  HENT:  Head: Normocephalic.  Right Ear: External ear normal.  Left Ear: External ear normal.  Eyes: Conjunctivae and EOM are normal.  Neck: Normal range of motion.  Cardiovascular: Normal rate and normal heart sounds.   Pulmonary/Chest: Breath sounds normal.  Abdominal: Bowel sounds are normal.  Musculoskeletal: Normal range of motion. He exhibits no edema and no tenderness.  Neurological: He is alert and oriented to person, place, and time.       Parkinsonian features with a slow deliberate gait with decreased arm swing  Psychiatric: He has a normal mood and affect. His behavior is normal.          Assessment & Plan:   Diabetes mellitus type 2. We'll check a hemoglobin A1c Dyslipidemia. We'll check a lipid profile  hypothyroidism. We'll check a TSH  hypertension well controlled  Recheck 3 months

## 2011-06-30 NOTE — Patient Instructions (Signed)
Limit your sodium (Salt) intake   Please check your hemoglobin A1c every 3 months  Return in 3 months for follow-up  Take a calcium supplement, plus [phone] units of vitamin D

## 2011-07-11 ENCOUNTER — Other Ambulatory Visit: Payer: Self-pay | Admitting: Family Medicine

## 2011-09-07 ENCOUNTER — Ambulatory Visit (INDEPENDENT_AMBULATORY_CARE_PROVIDER_SITE_OTHER): Payer: Medicare Other | Admitting: Internal Medicine

## 2011-09-07 ENCOUNTER — Encounter: Payer: Self-pay | Admitting: Internal Medicine

## 2011-09-07 VITALS — BP 100/70 | Wt 204.0 lb

## 2011-09-07 DIAGNOSIS — R319 Hematuria, unspecified: Secondary | ICD-10-CM

## 2011-09-07 DIAGNOSIS — N390 Urinary tract infection, site not specified: Secondary | ICD-10-CM

## 2011-09-07 DIAGNOSIS — R269 Unspecified abnormalities of gait and mobility: Secondary | ICD-10-CM

## 2011-09-07 DIAGNOSIS — I10 Essential (primary) hypertension: Secondary | ICD-10-CM

## 2011-09-07 LAB — POCT URINALYSIS DIPSTICK
Spec Grav, UA: 1.025
Urobilinogen, UA: 2

## 2011-09-07 MED ORDER — CIPROFLOXACIN HCL 500 MG PO TABS: 500.0000 mg | ORAL_TABLET | Freq: Two times a day (BID) | ORAL | Status: AC

## 2011-09-07 NOTE — Progress Notes (Signed)
  Subjective:    Patient ID: Nicholas Barnett, male    DOB: May 07, 1931, 78 y.o.   MRN: [id number]  HPI   78 year old patient who presents with a three-day history of hematuria dysuria urgency and frequency. He does have a prior history of UTIs he has a history of BPH. No fever or chills but he generally feels unwell.  Urinalysis was reviewed that revealed hematuria and pyuria.  History of hypertension and type 2 diabetes which has been stable. He has a history of parkinsonism with unsteady gait.   Review of Systems  Constitutional: Positive for appetite change and fatigue. Negative for fever and chills.  HENT: Negative for hearing loss, ear pain, congestion, sore throat, trouble swallowing, neck stiffness, dental problem, voice change and tinnitus.   Eyes: Negative for pain, discharge and visual disturbance.  Respiratory: Negative for cough, chest tightness, wheezing and stridor.   Cardiovascular: Negative for chest pain, palpitations and leg swelling.  Gastrointestinal: Negative for nausea, vomiting, abdominal pain, diarrhea, constipation, blood in stool and abdominal distention.  Genitourinary: Positive for dysuria, urgency, frequency, hematuria and difficulty urinating. Negative for flank pain, discharge and genital sores.  Musculoskeletal: Negative for myalgias, back pain, joint swelling, arthralgias and gait problem.  Skin: Negative for rash.  Neurological: Negative for dizziness, syncope, speech difficulty, weakness, numbness and headaches.  Hematological: Negative for adenopathy. Does not bruise/bleed easily.  Psychiatric/Behavioral: Negative for behavioral problems and dysphoric mood. The patient is not nervous/anxious.        Objective:   Physical Exam  Constitutional: He is oriented to person, place, and time. He appears well-developed and well-nourished.  HENT:  Head: Normocephalic.  Right Ear: External ear normal.  Left Ear: External ear normal.  Eyes: Conjunctivae and EOM  are normal.       Anicteric  Neck: Normal range of motion.  Cardiovascular: Normal rate and normal heart sounds.   Pulmonary/Chest: Breath sounds normal.  Abdominal: Bowel sounds are normal.  Musculoskeletal: Normal range of motion. He exhibits no edema and no tenderness.  Neurological: He is alert and oriented to person, place, and time.       Bradykinesia with slow slightly unsteady gait  Psychiatric: He has a normal mood and affect. His behavior is normal.          Assessment & Plan:   Acute UTI. Will treat with Cipro for 7 days. He'll call if there is not propped clinical improvement Hypertension stable diabetes mellitus stable Parkinsonian syndrome

## 2011-09-07 NOTE — Patient Instructions (Signed)
Drink as much fluid as you  can tolerate over the next few days  Take your antibiotic as prescribed until ALL of it is gone, but stop if you develop a rash, swelling, or any side effects of the medication.  Contact our office as soon as possible if  there are side effects of the medication.  Call or return to clinic prn if these symptoms worsen or fail to improve as anticipated.   

## 2011-09-30 ENCOUNTER — Ambulatory Visit: Payer: Medicare Other | Admitting: Internal Medicine

## 2011-12-08 ENCOUNTER — Ambulatory Visit (INDEPENDENT_AMBULATORY_CARE_PROVIDER_SITE_OTHER): Payer: Medicare Other | Admitting: Internal Medicine

## 2011-12-08 ENCOUNTER — Encounter: Payer: Self-pay | Admitting: Internal Medicine

## 2011-12-08 VITALS — BP 120/80 | Temp 97.9°F | Wt 199.0 lb

## 2011-12-08 DIAGNOSIS — Z23 Encounter for immunization: Secondary | ICD-10-CM

## 2011-12-08 DIAGNOSIS — E039 Hypothyroidism, unspecified: Secondary | ICD-10-CM

## 2011-12-08 DIAGNOSIS — E119 Type 2 diabetes mellitus without complications: Secondary | ICD-10-CM

## 2011-12-08 DIAGNOSIS — E538 Deficiency of other specified B group vitamins: Secondary | ICD-10-CM

## 2011-12-08 DIAGNOSIS — R269 Unspecified abnormalities of gait and mobility: Secondary | ICD-10-CM

## 2011-12-08 DIAGNOSIS — I10 Essential (primary) hypertension: Secondary | ICD-10-CM

## 2011-12-08 MED ORDER — BENAZEPRIL HCL 10 MG PO TABS: 10.0000 mg | ORAL_TABLET | Freq: Every day | ORAL | Status: DC

## 2011-12-08 MED ORDER — AMLODIPINE BESYLATE 5 MG PO TABS: 5.0000 mg | ORAL_TABLET | Freq: Every day | ORAL | Status: DC

## 2011-12-08 NOTE — Progress Notes (Signed)
Subjective:    Patient ID: Nicholas Barnett, male    DOB: 1931-08-29, 78 y.o.   MRN: [id number]  HPI  78 year old patient who is seen today for followup. He has type 2 diabetes controlled on oral agents. This has been quite stable. He is followed by neurology for Parkinson's disease and this has been stable. History of hypertension dyslipidemia. He has arthritis mainly in involving the shoulders been fairly generalized. No new concerns or complaints. He remains on simvastatin which he continues to tolerate well. He has had some slight worsening peripheral edema. Blood pressure regimen includes amlodipine 10 mg daily.  BP Readings from Last 3 Encounters:  12/08/11 120/80  09/07/11 100/70  06/30/11 106/70     Past Medical History  Diagnosis Date  . Diabetes mellitus   . Hyperlipidemia   . Hypertension   . Arthritis   . Diverticulitis   . Parkinsonism   . Parkinsonism   . Memory loss   . Depression   . Gait disturbance     History   Social History  . Marital Status: Married    Spouse Name: N/A    Number of Children: N/A  . Years of Education: N/A   Occupational History  . Not on file.   Social History Main Topics  . Smoking status: Former Games developer  . Smokeless tobacco: Never Used   Comment: quit 30 years ago   . Alcohol Use: No  . Drug Use: No  . Sexually Active: No   Other Topics Concern  . Not on file   Social History Narrative  . No narrative on file    No past surgical history on file.  Family History  Problem Relation Age of Onset  . Heart disease Mother   . Diabetes Mother   . Cancer Father   . Diabetes Sister   . Heart disease Brother     Allergies  Allergen Reactions  . Caffeine     Current Outpatient Prescriptions on File Prior to Visit  Medication Sig Dispense Refill  . aspirin 81 MG tablet Take 81 mg by mouth daily.        . Calcium Carbonate-Vitamin D (CALCIUM-CARB 600 + D) 600-125 MG-UNIT TABS Take by mouth daily.        . cyanocobalamin  (,VITAMIN B-12,) 1000 MCG/ML injection Inject 1,000 mcg into the muscle every 30 (thirty) days.        Marland Kitchen glipiZIDE (GLUCOTROL XL) 10 MG 24 hr tablet TAKE ONE TABLET BY MOUTH EVERY DAY  90 tablet  3  . glucose blood test strip 1 each by Other route as needed. Use as instructed       . Lancets (FREESTYLE) lancets 1 each by Other route as needed. Use as instructed       . metFORMIN (GLUCOPHAGE) 1000 MG tablet TAKE ONE TABLET BY MOUTH TWICE DAILY WITH FOOD  180 tablet  3  . simvastatin (ZOCOR) 40 MG tablet TAKE ONE TABLET BY MOUTH AT BEDTIME  90 tablet  3  . Tamsulosin HCl (FLOMAX) 0.4 MG CAPS TAKE ONE CAPSULE BY MOUTH AT BEDTIME  90 capsule  3    BP 120/80  Temp 97.9 F (36.6 C) (Oral)  Wt 199 lb (90.266 kg)    Review of Systems  Constitutional: Negative for fever, chills, appetite change and fatigue.  HENT: Negative for hearing loss, ear pain, congestion, sore throat, trouble swallowing, neck stiffness, dental problem, voice change and tinnitus.   Eyes: Negative for pain, discharge and  visual disturbance.  Respiratory: Negative for cough, chest tightness, wheezing and stridor.   Cardiovascular: Positive for leg swelling. Negative for chest pain and palpitations.  Gastrointestinal: Negative for nausea, vomiting, abdominal pain, diarrhea, constipation, blood in stool and abdominal distention.  Genitourinary: Positive for urgency and frequency. Negative for hematuria, flank pain, discharge, difficulty urinating and genital sores.  Musculoskeletal: Positive for arthralgias and gait problem. Negative for myalgias, back pain and joint swelling.  Skin: Negative for rash.  Neurological: Negative for dizziness, syncope, speech difficulty, weakness, numbness and headaches.  Hematological: Negative for adenopathy. Does not bruise/bleed easily.  Psychiatric/Behavioral: Negative for behavioral problems and dysphoric mood. The patient is not nervous/anxious.        Objective:   Physical Exam    Constitutional: He is oriented to person, place, and time. He appears well-developed and well-nourished. He appears distressed.  HENT:  Head: Normocephalic.  Right Ear: External ear normal.  Left Ear: External ear normal.  Eyes: Conjunctivae normal and EOM are normal.  Neck: Normal range of motion.  Cardiovascular: Normal rate and normal heart sounds.   Pulmonary/Chest: Effort normal and breath sounds normal.  Abdominal: Bowel sounds are normal.  Musculoskeletal: Normal range of motion. He exhibits no edema and no tenderness.  Neurological: He is alert and oriented to person, place, and time.       Bradykinesia  Psychiatric: He has a normal mood and affect. His behavior is normal.          Assessment & Plan:   Hypertension well controlled. We'll decrease amlodipine to 5 mg daily due 2 peripheral edema Diabetes mellitus type 2. Will check a hemoglobin A1c History vitamin B12 deficiency. Patient self injects the monthly Osteoarthritis Dyslipidemia  Will check a hemoglobin A1c Low-salt diet recommended He does complain of urinary frequency.  Will  moderate his fluid intake

## 2011-12-08 NOTE — Patient Instructions (Signed)
Limit your sodium (Salt) intake   Please check your hemoglobin A1c every 3 months  Return in 3 months for follow-up   

## 2012-02-18 ENCOUNTER — Emergency Department (HOSPITAL_COMMUNITY): Payer: Medicare Other

## 2012-02-18 ENCOUNTER — Emergency Department (HOSPITAL_COMMUNITY)
Admission: EM | Admit: 2012-02-18 | Discharge: 2012-02-19 | Disposition: A | Discharge status: Home / Self Care | Payer: Medicare Other | Attending: Emergency Medicine | Admitting: Emergency Medicine

## 2012-02-18 ENCOUNTER — Encounter (HOSPITAL_COMMUNITY): Payer: Self-pay | Admitting: *Deleted

## 2012-02-18 DIAGNOSIS — Z87891 Personal history of nicotine dependence: Secondary | ICD-10-CM | POA: Insufficient documentation

## 2012-02-18 DIAGNOSIS — F329 Major depressive disorder, single episode, unspecified: Secondary | ICD-10-CM | POA: Insufficient documentation

## 2012-02-18 DIAGNOSIS — N12 Tubulo-interstitial nephritis, not specified as acute or chronic: Secondary | ICD-10-CM

## 2012-02-18 DIAGNOSIS — G20 Parkinson's disease: Secondary | ICD-10-CM | POA: Insufficient documentation

## 2012-02-18 DIAGNOSIS — I10 Essential (primary) hypertension: Secondary | ICD-10-CM | POA: Insufficient documentation

## 2012-02-18 DIAGNOSIS — Z8739 Personal history of other diseases of the musculoskeletal system and connective tissue: Secondary | ICD-10-CM | POA: Insufficient documentation

## 2012-02-18 DIAGNOSIS — Z7982 Long term (current) use of aspirin: Secondary | ICD-10-CM | POA: Insufficient documentation

## 2012-02-18 DIAGNOSIS — E785 Hyperlipidemia, unspecified: Secondary | ICD-10-CM | POA: Insufficient documentation

## 2012-02-18 DIAGNOSIS — F3289 Other specified depressive episodes: Secondary | ICD-10-CM | POA: Insufficient documentation

## 2012-02-18 DIAGNOSIS — E119 Type 2 diabetes mellitus without complications: Secondary | ICD-10-CM | POA: Insufficient documentation

## 2012-02-18 DIAGNOSIS — N390 Urinary tract infection, site not specified: Secondary | ICD-10-CM

## 2012-02-18 DIAGNOSIS — Z79899 Other long term (current) drug therapy: Secondary | ICD-10-CM | POA: Insufficient documentation

## 2012-02-18 DIAGNOSIS — Z8719 Personal history of other diseases of the digestive system: Secondary | ICD-10-CM | POA: Insufficient documentation

## 2012-02-18 DIAGNOSIS — M129 Arthropathy, unspecified: Secondary | ICD-10-CM | POA: Insufficient documentation

## 2012-02-18 DIAGNOSIS — Z7901 Long term (current) use of anticoagulants: Secondary | ICD-10-CM | POA: Insufficient documentation

## 2012-02-18 DIAGNOSIS — R102 Pelvic and perineal pain: Secondary | ICD-10-CM

## 2012-02-18 DIAGNOSIS — Z8669 Personal history of other diseases of the nervous system and sense organs: Secondary | ICD-10-CM | POA: Insufficient documentation

## 2012-02-18 DIAGNOSIS — R109 Unspecified abdominal pain: Secondary | ICD-10-CM | POA: Insufficient documentation

## 2012-02-18 DIAGNOSIS — R1024 Suprapubic pain: Secondary | ICD-10-CM

## 2012-02-18 DIAGNOSIS — R110 Nausea: Secondary | ICD-10-CM | POA: Insufficient documentation

## 2012-02-18 DIAGNOSIS — G20A1 Parkinson's disease without dyskinesia, without mention of fluctuations: Secondary | ICD-10-CM | POA: Insufficient documentation

## 2012-02-18 LAB — CBC WITH DIFFERENTIAL/PLATELET
Eosinophils Absolute: 0 10*3/uL (ref 0.0–0.7)
Eosinophils Relative: 0 % (ref 0–5)
HCT: 48.1 % (ref 39.0–52.0)
Hemoglobin: 17.1 g/dL — ABNORMAL HIGH (ref 13.0–17.0)
Lymphocytes Relative: 6 % — ABNORMAL LOW (ref 12–46)
Lymphs Abs: 0.8 10*3/uL (ref 0.7–4.0)
MCH: 31.9 pg (ref 26.0–34.0)
MCV: 89.7 fL (ref 78.0–100.0)
Monocytes Absolute: 0.5 10*3/uL (ref 0.1–1.0)
Monocytes Relative: 3 % (ref 3–12)
Platelets: 256 10*3/uL (ref 150–400)
RBC: 5.36 MIL/uL (ref 4.22–5.81)

## 2012-02-18 LAB — COMPREHENSIVE METABOLIC PANEL
BUN: 18 mg/dL (ref 6–23)
CO2: 23 mEq/L (ref 19–32)
Calcium: 10.5 mg/dL (ref 8.4–10.5)
GFR calc Af Amer: >90 mL/min (ref >90)
GFR calc non Af Amer: 81 mL/min — ABNORMAL LOW (ref >90)
Glucose, Bld: 203 mg/dL — ABNORMAL HIGH (ref 70–99)
Total Protein: 7.9 g/dL (ref 6.0–8.3)

## 2012-02-18 LAB — URINE MICROSCOPIC-ADD ON

## 2012-02-18 LAB — URINALYSIS, ROUTINE W REFLEX MICROSCOPIC
Bilirubin Urine: NEGATIVE
Ketones, ur: 40 mg/dL — AB
Nitrite: NEGATIVE
Specific Gravity, Urine: 1.018 (ref 1.005–1.030)
Urobilinogen, UA: 0.2 mg/dL (ref 0.0–1.0)
pH: 5.5 (ref 5.0–8.0)

## 2012-02-18 MED ORDER — SODIUM CHLORIDE 0.9 % IV BOLUS (SEPSIS)
700.0000 mL | Freq: Once | INTRAVENOUS | Status: AC
  Administered 2012-02-18: 700 mL via INTRAVENOUS

## 2012-02-18 MED ORDER — MORPHINE SULFATE 4 MG/ML IJ SOLN
4.0000 mg | Freq: Once | INTRAMUSCULAR | Status: AC
  Administered 2012-02-18: 4 mg via INTRAVENOUS
  Filled 2012-02-18: qty 1

## 2012-02-18 MED ORDER — ONDANSETRON HCL 4 MG/2ML IJ SOLN
4.0000 mg | Freq: Once | INTRAMUSCULAR | Status: AC
  Administered 2012-02-18: 4 mg via INTRAVENOUS
  Filled 2012-02-18: qty 2

## 2012-02-18 MED ORDER — SODIUM CHLORIDE 0.9 % IV SOLN
INTRAVENOUS | Status: DC
  Administered 2012-02-18: via INTRAVENOUS

## 2012-02-18 NOTE — ED Notes (Signed)
Pt had approximately 80mL in bladder upon Bladder Scan.

## 2012-02-18 NOTE — ED Notes (Signed)
Pt c/o abd pain starting at noon; bowel movement x 3 today; nausea; shortness of breath at times; no urinary symptoms; no vomiting

## 2012-02-19 MED ORDER — ONDANSETRON HCL 4 MG PO TABS: 4.0000 mg | ORAL_TABLET | Freq: Four times a day (QID) | ORAL | Status: DC

## 2012-02-19 MED ORDER — IOHEXOL 300 MG/ML  SOLN
100.0000 mL | Freq: Once | INTRAMUSCULAR | Status: AC | PRN
  Administered 2012-02-19: 100 mL via INTRAVENOUS

## 2012-02-19 MED ORDER — HYDROCODONE-ACETAMINOPHEN 5-325 MG PO TABS: ORAL_TABLET | ORAL | Status: DC

## 2012-02-19 MED ORDER — CEPHALEXIN 500 MG PO CAPS: 500.0000 mg | ORAL_CAPSULE | Freq: Three times a day (TID) | ORAL | Status: DC | PRN

## 2012-02-19 MED ORDER — DEXTROSE 5 % IV SOLN
1.0000 g | Freq: Once | INTRAVENOUS | Status: AC
  Administered 2012-02-19: 1 g via INTRAVENOUS
  Filled 2012-02-19: qty 10

## 2012-02-19 NOTE — ED Notes (Signed)
Dr Daleen Bo prescription for Keflex 500 mg  TID for 7 days dispense 21 instead of one written by Dr Lynelle Doctor.

## 2012-02-19 NOTE — ED Provider Notes (Signed)
History     CSN: [id number]  Arrival date & time 02/18/12  2155   First MD Initiated Contact with Patient 02/18/12 2303      No chief complaint on file.   (Consider location/radiation/quality/duration/timing/severity/associated sxs/prior treatment) HPI Patient reports about one to 2 PM this afternoon he started having a stomach ache and indicates his pain is located in the suprapubic area. He states his weight has frequency and urinates every 30 minutes however he's not going as often today. He denies dysuria or urgency. He has had nausea without vomiting or diarrhea. He states he did have 3 normal bowel movements today which is unusual. He states he also has lost appetite and hasn't eaten since breakfast today. He states nothing he does makes the pain feel worse, nothing he does makes the pain feel better. He states he's never had this discomfort before. He has had no operations of his abdomen in the past.   PCP Dr Amador Cunas  Past Medical History  Diagnosis Date  . Diabetes mellitus   . Hyperlipidemia   . Hypertension   . Arthritis   . Diverticulitis   . Parkinsonism   . Parkinsonism   . Memory loss   . Depression   . Gait disturbance     History reviewed. No pertinent past surgical history.  Family History  Problem Relation Age of Onset  . Heart disease Mother   . Diabetes Mother   . Cancer Father   . Diabetes Sister   . Heart disease Brother     History  Substance Use Topics  . Smoking status: Former Smoker 40 years ago  . Smokeless tobacco: Never Used     Comment: quit 30 years ago   . Alcohol Use: No   Lives at home Lives with spouse   Review of Systems  All other systems reviewed and are negative.    Allergies  Caffeine  Home Medications   Current Outpatient Rx  Name  Route  Sig  Dispense  Refill  . AMLODIPINE BESYLATE 10 MG PO TABS   Oral   Take 10 mg by mouth every morning.         . ASPIRIN 81 MG PO TABS   Oral   Take 81 mg by  mouth every morning.          Marland Kitchen BENAZEPRIL HCL 20 MG PO TABS   Oral   Take 20 mg by mouth every morning.         Marland Kitchen CALCIUM CARBONATE-VITAMIN D 600-125 MG-UNIT PO TABS   Oral   Take 2 tablets by mouth every morning.          Marland Kitchen CYANOCOBALAMIN 1000 MCG/ML IJ SOLN   Intramuscular   Inject 1,000 mcg into the muscle every 30 (thirty) days.          Marland Kitchen GABAPENTIN 100 MG PO CAPS   Oral   Take 200 mg by mouth every evening.         Marland Kitchen GLIPIZIDE ER 10 MG PO TB24   Oral   Take 10 mg by mouth every morning.         Marland Kitchen MELATONIN 10 MG PO CAPS   Oral   Take 1 capsule by mouth at bedtime.         Marland Kitchen METFORMIN HCL 1000 MG PO TABS   Oral   Take 1,000 mg by mouth 2 (two) times daily with a meal.         . SIMVASTATIN  40 MG PO TABS   Oral   Take 40 mg by mouth at bedtime.         . TAMSULOSIN HCL 0.4 MG PO CAPS   Oral   Take 0.4 mg by mouth at bedtime.           BP 147/87  Pulse 86  Temp 98.1 F (36.7 C) (Oral)  Resp 20  Ht 6\' 3"  (1.905 m)  Wt 195 lb (88.451 kg)  BMI 24.37 kg/m2  SpO2 93%  Vital signs normal    Physical Exam  Nursing note and vitals reviewed. Constitutional: He is oriented to person, place, and time. He appears well-developed and well-nourished.  Non-toxic appearance. He does not appear ill. No distress.  HENT:  Head: Normocephalic and atraumatic.  Right Ear: External ear normal.  Left Ear: External ear normal.  Nose: Nose normal. No mucosal edema or rhinorrhea.  Mouth/Throat: Mucous membranes are normal. No dental abscesses or uvula swelling.       Dry tongue  Eyes: Conjunctivae normal and EOM are normal. Pupils are equal, round, and reactive to light.  Neck: Normal range of motion and full passive range of motion without pain. Neck supple.  Cardiovascular: Normal rate, regular rhythm and normal heart sounds.  Exam reveals no gallop and no friction rub.   No murmur heard. Pulmonary/Chest: Effort normal and breath sounds normal. No  respiratory distress. He has no wheezes. He has no rhonchi. He has no rales. He exhibits no tenderness and no crepitus.  Abdominal: Soft. Normal appearance. He exhibits no distension. There is no tenderness. There is no rebound and no guarding.       Hypoactive bowel sounds Tender in the suprapubic area without guarding or rebound  Musculoskeletal: Normal range of motion. He exhibits no edema and no tenderness.       Moves all extremities well.   Neurological: He is alert and oriented to person, place, and time. He has normal strength. No cranial nerve deficit.  Skin: Skin is warm, dry and intact. No rash noted. No erythema. There is pallor.  Psychiatric: His speech is normal and behavior is normal. His mood appears not anxious.       Flat affect    ED Course  Procedures (including critical care time)   Medications  morphine 4 MG/ML injection 4 mg (4 mg Intravenous Given 02/18/12 2327)  ondansetron (ZOFRAN) injection 4 mg (4 mg Intravenous Given 02/18/12 2327)  sodium chloride 0.9 % bolus 700 mL (0 mL Intravenous Stopped 02/19/12 0204)  iohexol (OMNIPAQUE) 300 MG/ML solution 100 mL (100 mL Intravenous Contrast Given 02/19/12 0158)  cefTRIAXone (ROCEPHIN) 1 g in dextrose 5 % 50 mL IVPB (1 g Intravenous New Bag/Given 02/19/12 0318)   03:00 Recheck after CT resulted, pt states his pain is gone. Feels like he can go home and take oral meds.   Results for orders placed during the hospital encounter of 02/18/12  CBC WITH DIFFERENTIAL      Component Value Range   WBC 14.7 (*) 4.0 - 10.5 K/uL   RBC 5.36  4.22 - 5.81 MIL/uL   Hemoglobin 17.1 (*) 13.0 - 17.0 g/dL   HCT 96.0  45.4 - 09.8 %   MCV 89.7  78.0 - 100.0 fL   MCH 31.9  26.0 - 34.0 pg   MCHC 35.6  30.0 - 36.0 g/dL   RDW 11.9  14.7 - 82.9 %   Platelets 256  150 - 400 K/uL   Neutrophils Relative  91 (*) 43 - 77 %   Neutro Abs 13.3 (*) 1.7 - 7.7 K/uL   Lymphocytes Relative 6 (*) 12 - 46 %   Lymphs Abs 0.8  0.7 - 4.0 K/uL    Monocytes Relative 3  3 - 12 %   Monocytes Absolute 0.5  0.1 - 1.0 K/uL   Eosinophils Relative 0  0 - 5 %   Eosinophils Absolute 0.0  0.0 - 0.7 K/uL   Basophils Relative 0  0 - 1 %   Basophils Absolute 0.0  0.0 - 0.1 K/uL  COMPREHENSIVE METABOLIC PANEL      Component Value Range   Sodium 137  135 - 145 mEq/L   Potassium 4.1  3.5 - 5.1 mEq/L   Chloride 98  96 - 112 mEq/L   CO2 23  19 - 32 mEq/L   Glucose, Bld 203 (*) 70 - 99 mg/dL   BUN 18  6 - 23 mg/dL   Creatinine, Ser 1.61  0.50 - 1.35 mg/dL   Calcium 09.6  8.4 - 04.5 mg/dL   Total Protein 7.9  6.0 - 8.3 g/dL   Albumin 4.3  3.5 - 5.2 g/dL   AST 24  0 - 37 U/L   ALT 16  0 - 53 U/L   Alkaline Phosphatase 88  39 - 117 U/L   Total Bilirubin 0.7  0.3 - 1.2 mg/dL   GFR calc non Af Amer 81 (*) >90 mL/min   GFR calc Af Amer >90  >90 mL/min  URINALYSIS, ROUTINE W REFLEX MICROSCOPIC      Component Value Range   Color, Urine YELLOW  YELLOW   APPearance CLEAR  CLEAR   Specific Gravity, Urine 1.018  1.005 - 1.030   pH 5.5  5.0 - 8.0   Glucose, UA 100 (*) NEGATIVE mg/dL   Hgb urine dipstick LARGE (*) NEGATIVE   Bilirubin Urine NEGATIVE  NEGATIVE   Ketones, ur 40 (*) NEGATIVE mg/dL   Protein, ur NEGATIVE  NEGATIVE mg/dL   Urobilinogen, UA 0.2  0.0 - 1.0 mg/dL   Nitrite NEGATIVE  NEGATIVE   Leukocytes, UA SMALL (*) NEGATIVE  LIPASE, BLOOD      Component Value Range   Lipase 24  11 - 59 U/L  URINE MICROSCOPIC-ADD ON      Component Value Range   Squamous Epithelial / LPF RARE  RARE   WBC, UA 3-6  <3 WBC/hpf   RBC / HPF 7-10  <3 RBC/hpf   Bacteria, UA RARE  RARE   Urine-Other MUCOUS PRESENT     Laboratory interpretation all normal except except leukocytosis, possible UTI    Ct Abdomen Pelvis W Contrast  02/19/2012  *RADIOLOGY REPORT*  Clinical Data: Left-sided abdominal pain.  Leukocytosis.  Nausea.  CT ABDOMEN AND PELVIS WITH CONTRAST  Technique:  Multidetector CT imaging of the abdomen and pelvis was performed following the  standard protocol during bolus administration of intravenous contrast.  Contrast: OMNIPAQUE IOHEXOL 300 MG/ML  SOLN  Comparison: Lumbar spine MRI performed 07/31/2010  Findings: Minimal right basilar atelectasis is noted.  Diffuse coronary artery calcifications are seen.  The liver and spleen are unremarkable in appearance.  Scattered small stones are seen layering dependently within the gallbladder; the gallbladder is otherwise unremarkable in appearance.  The pancreas and adrenal glands are unremarkable.  There is a left-sided perinephric stranding and fluid, with fluid tracking about Gerota's fascia on the left side, and mild prominence of the left ureter, with periureteral stranding.  This raises concern for mild left-sided pyelonephritis and ureteritis, since no distal obstructing stone is seen, though left renal enhancement remains grossly normal.  Mild nonspecific right-sided perinephric stranding is seen.  There is no evidence of significant hydronephrosis.  No renal stones are identified.  No free fluid is identified.  The small bowel is unremarkable in appearance.  The stomach is within normal limits.  No acute vascular abnormalities are seen.  Mild scattered calcification is noted along the abdominal aorta and its branches, including at the origins of both renal arteries.  The appendix is normal in caliber, without evidence for appendicitis.  Scattered diverticulosis is noted along the proximal sigmoid colon.  The colon is otherwise unremarkable in appearance.  The bladder is mildly distended and grossly unremarkable in appearance.  The prostate remains normal in size.  No inguinal lymphadenopathy is seen.  No acute osseous abnormalities are identified.  There is mild vacuum phenomenon along the lower thoracic spine, and mild endplate irregularity at L2-L3.  There is mild grade 1 anterolisthesis of L4 on L5, reflecting facet disease.  IMPRESSION:  1.  Left-sided perinephric stranding and fluid, with  fluid tracking about Gerota's fascia, and mild prominence of the left ureter, with periureteral stranding.  This raises concern for mild left-sided pyelonephritis and ureteritis, since no distal obstructing stone is seen.  However, left renal enhancement remains grossly normal. Would correlate for signs of pyelonephritis. 2.  Diffuse coronary artery calcifications seen. 3.  Mild scattered calcification along the abdominal aorta and its branches, including at the origins of both renal arteries. 4.  Scattered diverticulosis along the proximal sigmoid colon. 5.  Cholelithiasis noted; gallbladder otherwise unremarkable in appearance.   Original Report Authenticated By: Tonia Ghent, M.D.      1. Suprapubic abdominal pain   2. UTI (lower urinary tract infection)   3. Pyelonephritis    New Prescriptions   CEPHALEXIN (KEFLEX) 500 MG CAPSULE    Take 1 capsule (500 mg total) by mouth 3 (three) times daily as needed (nausea or vomiting).   HYDROCODONE-ACETAMINOPHEN (NORCO/VICODIN) 5-325 MG PER TABLET    Take 1 or 2 po Q 6hrs for pain   ONDANSETRON (ZOFRAN) 4 MG TABLET    Take 1 tablet (4 mg total) by mouth every 6 (six) hours.    Plan discharge  Devoria Albe, MD, FACEP    MDM          Ward Givens, MD 02/19/12 [phone]

## 2012-02-21 LAB — URINE CULTURE

## 2012-02-25 ENCOUNTER — Encounter: Payer: Self-pay | Admitting: Internal Medicine

## 2012-02-25 ENCOUNTER — Ambulatory Visit (INDEPENDENT_AMBULATORY_CARE_PROVIDER_SITE_OTHER): Payer: Medicare Other | Admitting: Internal Medicine

## 2012-02-25 VITALS — BP 150/80 | HR 93 | Temp 97.6°F | Resp 20 | Wt 194.0 lb

## 2012-02-25 DIAGNOSIS — I10 Essential (primary) hypertension: Secondary | ICD-10-CM

## 2012-02-25 DIAGNOSIS — E119 Type 2 diabetes mellitus without complications: Secondary | ICD-10-CM

## 2012-02-25 DIAGNOSIS — E785 Hyperlipidemia, unspecified: Secondary | ICD-10-CM

## 2012-02-25 DIAGNOSIS — N390 Urinary tract infection, site not specified: Secondary | ICD-10-CM

## 2012-02-25 NOTE — Patient Instructions (Signed)
Take your antibiotic as prescribed until ALL of it is gone, but stop if you develop a rash, swelling, or any side effects of the medication.  Contact our office as soon as possible if  there are side effects of the medication.   Please check your hemoglobin A1c every 3 months  Limit your sodium (Salt) intake

## 2012-02-25 NOTE — Progress Notes (Signed)
Subjective:    Patient ID: Nicholas Barnett, male    DOB: 1931/08/04, 79 y.o.   MRN: [id number]  HPI  79 year old patient who is seen today following an ER visit approximately one week ago. He was treated for an acute UTI. A urine culture revealed Escherichia coli which was pansensitive. His lower bowel pain has resolved and basically he is back to baseline. No fever. He has type 2 diabetes controlled on oral agents. No recent hemoglobin A 1C. He has hypertension which has been well-controlled His only complaint is urinary incontinence. He does use an adult diaper. He does have a history of BPH and has been on Flomax  Past Medical History  Diagnosis Date  . Diabetes mellitus   . Hyperlipidemia   . Hypertension   . Arthritis   . Diverticulitis   . Parkinsonism   . Parkinsonism   . Memory loss   . Depression   . Gait disturbance     History   Social History  . Marital Status: Married    Spouse Name: N/A    Number of Children: N/A  . Years of Education: N/A   Occupational History  . Not on file.   Social History Main Topics  . Smoking status: Former Games developer  . Smokeless tobacco: Never Used     Comment: quit 30 years ago   . Alcohol Use: No  . Drug Use: No  . Sexually Active: No   Other Topics Concern  . Not on file   Social History Narrative  . No narrative on file    No past surgical history on file.  Family History  Problem Relation Age of Onset  . Heart disease Mother   . Diabetes Mother   . Cancer Father   . Diabetes Sister   . Heart disease Brother     Allergies  Allergen Reactions  . Caffeine     Current Outpatient Prescriptions on File Prior to Visit  Medication Sig Dispense Refill  . amLODipine (NORVASC) 10 MG tablet Take 10 mg by mouth every morning.      Marland Kitchen aspirin 81 MG tablet Take 81 mg by mouth every morning.       . benazepril (LOTENSIN) 20 MG tablet Take 20 mg by mouth every morning.      . Calcium Carbonate-Vitamin D (CALCIUM-CARB 600 +  D) 600-125 MG-UNIT TABS Take 2 tablets by mouth every morning.       . cephALEXin (KEFLEX) 500 MG capsule Take 1 capsule (500 mg total) by mouth 3 (three) times daily as needed (nausea or vomiting).  8 capsule  0  . cyanocobalamin (,VITAMIN B-12,) 1000 MCG/ML injection Inject 1,000 mcg into the muscle every 30 (thirty) days.       Marland Kitchen gabapentin (NEURONTIN) 100 MG capsule Take 200 mg by mouth every evening.      Marland Kitchen glipiZIDE (GLUCOTROL XL) 10 MG 24 hr tablet Take 10 mg by mouth every morning.      Marland Kitchen HYDROcodone-acetaminophen (NORCO/VICODIN) 5-325 MG per tablet Take 1 or 2 po Q 6hrs for pain  16 tablet  0  . metFORMIN (GLUCOPHAGE) 1000 MG tablet Take 1,000 mg by mouth 2 (two) times daily with a meal.      . simvastatin (ZOCOR) 40 MG tablet Take 40 mg by mouth at bedtime.      . Tamsulosin HCl (FLOMAX) 0.4 MG CAPS Take 0.4 mg by mouth at bedtime.      . Melatonin 10 MG CAPS Take  1 capsule by mouth at bedtime.      . ondansetron (ZOFRAN) 4 MG tablet Take 1 tablet (4 mg total) by mouth every 6 (six) hours.  12 tablet  0    BP 150/80  Pulse 93  Temp 97.6 F (36.4 C) (Oral)  Resp 20  Wt 194 lb (87.998 kg)  SpO2 96%       Review of Systems  Constitutional: Negative for fever, chills, appetite change and fatigue.  HENT: Negative for hearing loss, ear pain, congestion, sore throat, trouble swallowing, neck stiffness, dental problem, voice change and tinnitus.   Eyes: Negative for pain, discharge and visual disturbance.  Respiratory: Negative for cough, chest tightness, wheezing and stridor.   Cardiovascular: Negative for chest pain, palpitations and leg swelling.  Gastrointestinal: Negative for nausea, vomiting, abdominal pain, diarrhea, constipation, blood in stool and abdominal distention.  Genitourinary: Positive for urgency, frequency and difficulty urinating. Negative for hematuria, flank pain, discharge and genital sores.  Musculoskeletal: Negative for myalgias, back pain, joint swelling,  arthralgias and gait problem.  Skin: Negative for rash.  Neurological: Negative for dizziness, syncope, speech difficulty, weakness, numbness and headaches.  Hematological: Negative for adenopathy. Does not bruise/bleed easily.  Psychiatric/Behavioral: Negative for behavioral problems and dysphoric mood. The patient is not nervous/anxious.        Objective:   Physical Exam  Constitutional: He is oriented to person, place, and time. He appears well-developed.  HENT:  Head: Normocephalic.  Right Ear: External ear normal.  Left Ear: External ear normal.  Eyes: Conjunctivae normal and EOM are normal.  Neck: Normal range of motion.  Cardiovascular: Normal rate and normal heart sounds.   Pulmonary/Chest: Breath sounds normal.  Abdominal: Bowel sounds are normal.  Musculoskeletal: Normal range of motion. He exhibits no edema and no tenderness.  Neurological: He is alert and oriented to person, place, and time.  Psychiatric: He has a normal mood and affect. His behavior is normal.          Assessment & Plan:   Status post UTI. Will complete cephalexin Hypertension stable Type 2 diabetes. We'll check a hemoglobin A1c Parkinson's disease followed in neurology in the spring  Recheck here 3 months

## 2012-03-03 ENCOUNTER — Telehealth: Payer: Self-pay | Admitting: Internal Medicine

## 2012-03-03 NOTE — Telephone Encounter (Signed)
Pt would like to switch from Dr Amador Cunas to Dr Selena Batten for availability reasons only.  Do you mind Dr Kirtland Bouchard?  Will you accept this pt Dr Selena Batten?

## 2012-03-03 NOTE — Telephone Encounter (Signed)
Dr. Kirtland Bouchard is usually reasonably available for his patients. Don't know that I will be any more available - particularly as my practice builds.

## 2012-03-08 ENCOUNTER — Telehealth: Payer: Self-pay | Admitting: Internal Medicine

## 2012-03-08 NOTE — Telephone Encounter (Signed)
Pt would like CPX in the am. All the CPX appts are in the afternoon around mid Jan. Pls advise. Thank you

## 2012-03-08 NOTE — Telephone Encounter (Signed)
Pt wants to keep appt in March.

## 2012-03-08 NOTE — Telephone Encounter (Signed)
Okay to combine two  15 minutes slots

## 2012-03-09 ENCOUNTER — Ambulatory Visit: Payer: Medicare Other | Admitting: Internal Medicine

## 2012-04-08 ENCOUNTER — Encounter: Payer: Medicare Other | Admitting: Internal Medicine

## 2012-05-13 ENCOUNTER — Encounter: Payer: Self-pay | Admitting: Internal Medicine

## 2012-05-13 ENCOUNTER — Ambulatory Visit (INDEPENDENT_AMBULATORY_CARE_PROVIDER_SITE_OTHER): Payer: Medicare Other | Admitting: Internal Medicine

## 2012-05-13 VITALS — BP 140/90 | HR 88 | Temp 97.7°F | Resp 20 | Ht 70.0 in | Wt 191.0 lb

## 2012-05-13 DIAGNOSIS — E039 Hypothyroidism, unspecified: Secondary | ICD-10-CM

## 2012-05-13 DIAGNOSIS — Z0000 Encounter for general adult medical examination without abnormal findings: Secondary | ICD-10-CM

## 2012-05-13 DIAGNOSIS — M67919 Unspecified disorder of synovium and tendon, unspecified shoulder: Secondary | ICD-10-CM

## 2012-05-13 DIAGNOSIS — R269 Unspecified abnormalities of gait and mobility: Secondary | ICD-10-CM

## 2012-05-13 DIAGNOSIS — I10 Essential (primary) hypertension: Secondary | ICD-10-CM

## 2012-05-13 DIAGNOSIS — E119 Type 2 diabetes mellitus without complications: Secondary | ICD-10-CM

## 2012-05-13 DIAGNOSIS — E538 Deficiency of other specified B group vitamins: Secondary | ICD-10-CM

## 2012-05-13 DIAGNOSIS — E785 Hyperlipidemia, unspecified: Secondary | ICD-10-CM

## 2012-05-13 DIAGNOSIS — M7551 Bursitis of right shoulder: Secondary | ICD-10-CM

## 2012-05-13 LAB — HEMOGLOBIN A1C: Hgb A1c MFr Bld: 6.1 % (ref 4.6–6.5)

## 2012-05-13 LAB — CBC WITH DIFFERENTIAL/PLATELET
Basophils Absolute: 0 10*3/uL (ref 0.0–0.1)
Eosinophils Absolute: 0.1 10*3/uL (ref 0.0–0.7)
Lymphocytes Relative: 24.9 % (ref 12.0–46.0)
MCHC: 34.3 g/dL (ref 30.0–36.0)
Neutro Abs: 6.8 10*3/uL (ref 1.4–7.7)
Neutrophils Relative %: 67 % (ref 43.0–77.0)
Platelets: 239 10*3/uL (ref 150.0–400.0)
RDW: 13.4 % (ref 11.5–14.6)

## 2012-05-13 LAB — COMPREHENSIVE METABOLIC PANEL
ALT: 19 U/L (ref 0–53)
AST: 20 U/L (ref 0–37)
Albumin: 4.2 g/dL (ref 3.5–5.2)
CO2: 30 mEq/L (ref 19–32)
Calcium: 10 mg/dL (ref 8.4–10.5)
Chloride: 102 mEq/L (ref 96–112)
Potassium: 4 mEq/L (ref 3.5–5.1)
Total Protein: 7.1 g/dL (ref 6.0–8.3)

## 2012-05-13 LAB — LIPID PANEL: Total CHOL/HDL Ratio: 4

## 2012-05-13 LAB — MICROALBUMIN / CREATININE URINE RATIO
Creatinine,U: 418.6 mg/dL
Microalb Creat Ratio: 19 mg/g (ref 0.0–30.0)
Microalb, Ur: 79.4 mg/dL — ABNORMAL HIGH (ref 0.0–1.9)

## 2012-05-13 MED ORDER — SIMVASTATIN 40 MG PO TABS: 40.0000 mg | ORAL_TABLET | Freq: Every day | ORAL | Status: DC

## 2012-05-13 MED ORDER — GLIPIZIDE ER 5 MG PO TB24: 5.0000 mg | ORAL_TABLET | Freq: Every day | ORAL | Status: DC

## 2012-05-13 MED ORDER — GLIPIZIDE ER 10 MG PO TB24: 10.0000 mg | ORAL_TABLET | Freq: Every morning | ORAL | Status: DC

## 2012-05-13 MED ORDER — TAMSULOSIN HCL 0.4 MG PO CAPS: 0.4000 mg | ORAL_CAPSULE | Freq: Every day | ORAL | Status: DC

## 2012-05-13 NOTE — Patient Instructions (Addendum)
Decrease glipizide to 5 mg daily   Please check your hemoglobin A1c every 3 months  Limit your sodium (Salt) intake

## 2012-05-13 NOTE — Progress Notes (Signed)
Subjective:    Patient ID: Nicholas Barnett, male    DOB: 1931-05-22, 79 y.o.   MRN: [id number]  HPI    Review of Systems     Objective:   Physical Exam        Assessment & Plan:   Subjective:    Patient ID: Nicholas Barnett, male    DOB: 08-31-1931, 79 y.o.   MRN: [id number]  HPI  79 year-old patient who is seen today for a preventive health examination. Dr. Scotty Court,  his long-time physician has retired. Medical problems include type 2 diabetes. Medicines were reviewed and apparently he is taking Glucotrol 10 mg daily. He is also on metformin therapy. He is also followed by Dr. love due to diabetic peripheral neuropathy and a parkinsonian syndrome. He has treated hypertension which includes hydrochlorothiazide 50 mg daily he is also on potassium supplementation. He has treated dyslipidemia. He remains quite sedentary and usually sleeps until noon. He has a history of BPH and is on Avodart and Flomax. He complains of some urinary incontinence.  Past Medical History  Diagnosis Date  . Diabetes mellitus   . Hyperlipidemia   . Hypertension   . Arthritis   . Diverticulitis   . Parkinsonism   . Parkinsonism   . Memory loss   . Depression   . Gait disturbance     History   Social History  . Marital Status: Married    Spouse Name: N/A    Number of Children: N/A  . Years of Education: N/A   Occupational History  . Not on file.   Social History Main Topics  . Smoking status: Former Games developer  . Smokeless tobacco: Never Used     Comment: quit 30 years ago   . Alcohol Use: No  . Drug Use: No  . Sexually Active: No   Other Topics Concern  . Not on file   Social History Narrative  . No narrative on file    History reviewed. No pertinent past surgical history.  Family History  Problem Relation Age of Onset  . Heart disease Mother   . Diabetes Mother   . Cancer Father   . Diabetes Sister   . Heart disease Brother     Allergies  Allergen Reactions  .  Caffeine     Current Outpatient Prescriptions on File Prior to Visit  Medication Sig Dispense Refill  . amLODipine (NORVASC) 10 MG tablet Take 10 mg by mouth every morning.      Marland Kitchen aspirin 81 MG tablet Take 81 mg by mouth every morning.       . benazepril (LOTENSIN) 20 MG tablet Take 20 mg by mouth every morning.      . Calcium Carbonate-Vitamin D (CALCIUM-CARB 600 + D) 600-125 MG-UNIT TABS Take 2 tablets by mouth every morning.       . cephALEXin (KEFLEX) 500 MG capsule Take 1 capsule (500 mg total) by mouth 3 (three) times daily as needed (nausea or vomiting).  8 capsule  0  . cyanocobalamin (,VITAMIN B-12,) 1000 MCG/ML injection Inject 1,000 mcg into the muscle every 30 (thirty) days.       Marland Kitchen gabapentin (NEURONTIN) 100 MG capsule Take 200 mg by mouth every evening.      Marland Kitchen HYDROcodone-acetaminophen (NORCO/VICODIN) 5-325 MG per tablet Take 1 or 2 po Q 6hrs for pain  16 tablet  0  . Melatonin 10 MG CAPS Take 1 capsule by mouth at bedtime.      . metFORMIN (  GLUCOPHAGE) 1000 MG tablet Take 1,000 mg by mouth 2 (two) times daily with a meal.      . ondansetron (ZOFRAN) 4 MG tablet Take 1 tablet (4 mg total) by mouth every 6 (six) hours.  12 tablet  0   No current facility-administered medications on file prior to visit.    BP 140/90  Pulse 88  Temp(Src) 97.7 F (36.5 C) (Oral)  Resp 20  Ht 5\' 10"  (1.778 m)  Wt 191 lb (86.637 kg)  BMI 27.41 kg/m2  SpO2 93%   1. Risk factors, based on past  M,S,F history-cardiovascular risk factors include diabetes hypertension and dyslipidemia.  2.  Physical activities: Remains quite sedentary do to parkinsonism and gait instability.  3.  Depression/mood: No history of major depression  4.  Hearing: Uses hearing aids bilaterally  5.  ADL's: Requires assistance in all aspects of daily living  6.  Fall risk: Moderate high do to unstable gait and parkinsonism. Walks with a cane  7.  Home safety: No problems identified  8.  Height weight, and  visual acuity; height and weight stable no change in visual acuity  9.  Counseling: Heart healthy diet encouraged  10. Lab orders based on risk factors: Laboratory profile including lipid profile will be reviewed  11. Referral : Followup neurology  12. Care plan: Heart healthy diet more regular exercise encouraged  13. Cognitive assessment: Alert and appropriate with slight psychomotor retardation     Review of Systems  Constitutional: Negative for fever, chills, activity change, appetite change and fatigue.  HENT: Negative for hearing loss, ear pain, congestion, rhinorrhea, sneezing, mouth sores, trouble swallowing, neck pain, neck stiffness, dental problem, voice change, sinus pressure and tinnitus.   Eyes: Negative for photophobia, pain, redness and visual disturbance.  Respiratory: Negative for apnea, cough, choking, chest tightness, shortness of breath and wheezing.   Cardiovascular: Negative for chest pain, palpitations and leg swelling.  Gastrointestinal: Negative for nausea, vomiting, abdominal pain, diarrhea, constipation, blood in stool, abdominal distention, anal bleeding and rectal pain.  Genitourinary: Positive for difficulty urinating. Negative for dysuria, urgency, frequency, hematuria, flank pain, decreased urine volume, discharge, penile swelling, scrotal swelling, genital sores and testicular pain.  Musculoskeletal: Positive for gait problem. Negative for myalgias, back pain, joint swelling and arthralgias.  Skin: Negative for color change, rash and wound.  Neurological: Positive for weakness. Negative for dizziness, tremors, seizures, syncope, facial asymmetry, speech difficulty, light-headedness, numbness and headaches.  Hematological: Negative for adenopathy. Does not bruise/bleed easily.  Psychiatric/Behavioral: Negative for suicidal ideas, hallucinations, behavioral problems, confusion, sleep disturbance, self-injury, dysphoric mood, decreased concentration and  agitation. The patient is not nervous/anxious.        Objective:   Physical Exam  Constitutional: He appears well-developed and well-nourished.       Blood pressure 90/60 sitting  HENT:  Head: Normocephalic and atraumatic.  Right Ear: External ear normal.  Left Ear: External ear normal.  Nose: Nose normal.  Mouth/Throat: Oropharynx is clear and moist.  Eyes: Conjunctivae and EOM are normal. Pupils are equal, round, and reactive to light. No scleral icterus.  Neck: Normal range of motion. Neck supple. No JVD present. No thyromegaly present.  Cardiovascular: Regular rhythm, normal heart sounds and intact distal pulses.  Exam reveals no gallop and no friction rub.   Dorsalis pedis pulses are trace ; posterior tibial pulses are full No murmur heard. Pulmonary/Chest: Effort normal and breath sounds normal. He exhibits no tenderness.  Abdominal: Soft. Bowel sounds are normal. He  exhibits no distension and no mass. There is no tenderness.  Genitourinary: Prostate normal and penis normal.  stool hematest negative prostate minimally enlarged  Musculoskeletal: Normal range of motion. He exhibits no edema and no tenderness.  Lymphadenopathy:    He has no cervical adenopathy.  Neurological: He is alert. He has normal reflexes. No cranial nerve deficit. Coordination normal.       Walks  slowly with a cane . Vibratory sensation slightly diminished on the right Skin: Skin is warm and dry. No rash noted.  Psychiatric: He has a normal mood and affect. His behavior is normal.          Assessment & Plan:   Hypertension. Presently blood pressure is well-controlled. We'll continue present regimen Diabetes mellitus. Will decrease Glucotrol to 5 mg daily. We'll continue metformin and check a hemoglobin A1c Parkinsonian syndrome. Followup neurology Dyslipidemia we'll continue simvastatin 40 mg daily  Bilateral shoulder pain. The patient has benefited from cortisone injections in the past and he is  requesting another injection. Patient had tenderness bilaterally over the subacromial bursa.  Procedure note. After alcohol prep, an injection of 2 cc of 1% lidocaine and 1 cc of 40 mg of Depo-Medrol was injected bilaterally over the subacromial bursa area.

## 2012-06-21 ENCOUNTER — Other Ambulatory Visit: Payer: Self-pay | Admitting: Internal Medicine

## 2012-07-22 ENCOUNTER — Telehealth: Payer: Self-pay | Admitting: Internal Medicine

## 2012-07-22 NOTE — Telephone Encounter (Signed)
ok 

## 2012-07-22 NOTE — Telephone Encounter (Signed)
Pt would like to switch to Dr Tawanna Cooler from Dr Amador Cunas.  Is that OK with you Dr Amador Cunas? Is that OK w/ you Dr Tawanna Cooler?

## 2012-07-25 NOTE — Telephone Encounter (Signed)
no

## 2012-08-10 ENCOUNTER — Encounter: Payer: Self-pay | Admitting: Neurology

## 2012-08-13 ENCOUNTER — Other Ambulatory Visit (HOSPITAL_COMMUNITY): Payer: Self-pay | Admitting: Neurology

## 2012-08-14 NOTE — Telephone Encounter (Signed)
Former Love patient assigned to Dr Athar.  

## 2012-08-15 ENCOUNTER — Ambulatory Visit: Payer: Medicare Other | Admitting: Internal Medicine

## 2012-09-27 ENCOUNTER — Telehealth: Payer: Self-pay | Admitting: Neurology

## 2012-09-27 NOTE — Telephone Encounter (Signed)
Transfer of Care Dr. Love needs to be reassigned per Dr. Athar °

## 2012-09-28 NOTE — Telephone Encounter (Signed)
Assigned to Dr. Penumalli. 

## 2012-10-06 ENCOUNTER — Encounter: Payer: Self-pay | Admitting: Neurology

## 2012-10-19 ENCOUNTER — Encounter: Payer: Self-pay | Admitting: Internal Medicine

## 2012-10-19 ENCOUNTER — Ambulatory Visit (INDEPENDENT_AMBULATORY_CARE_PROVIDER_SITE_OTHER): Payer: BC Managed Care – PPO | Admitting: Internal Medicine

## 2012-10-19 VITALS — BP 130/80 | HR 83 | Temp 97.4°F | Resp 18 | Ht 72.0 in | Wt 185.0 lb

## 2012-10-19 DIAGNOSIS — E1149 Type 2 diabetes mellitus with other diabetic neurological complication: Secondary | ICD-10-CM

## 2012-10-19 DIAGNOSIS — G909 Disorder of the autonomic nervous system, unspecified: Secondary | ICD-10-CM

## 2012-10-19 DIAGNOSIS — M25569 Pain in unspecified knee: Secondary | ICD-10-CM

## 2012-10-19 DIAGNOSIS — E538 Deficiency of other specified B group vitamins: Secondary | ICD-10-CM

## 2012-10-19 DIAGNOSIS — M545 Low back pain, unspecified: Secondary | ICD-10-CM

## 2012-10-19 DIAGNOSIS — G232 Striatonigral degeneration: Secondary | ICD-10-CM

## 2012-10-19 DIAGNOSIS — E785 Hyperlipidemia, unspecified: Secondary | ICD-10-CM

## 2012-10-19 DIAGNOSIS — Z9181 History of falling: Secondary | ICD-10-CM

## 2012-10-19 DIAGNOSIS — M25561 Pain in right knee: Secondary | ICD-10-CM

## 2012-10-19 DIAGNOSIS — R1310 Dysphagia, unspecified: Secondary | ICD-10-CM

## 2012-10-19 DIAGNOSIS — R42 Dizziness and giddiness: Secondary | ICD-10-CM

## 2012-10-19 DIAGNOSIS — M25519 Pain in unspecified shoulder: Secondary | ICD-10-CM

## 2012-10-19 DIAGNOSIS — R296 Repeated falls: Secondary | ICD-10-CM

## 2012-10-19 DIAGNOSIS — G20C Parkinsonism, unspecified: Secondary | ICD-10-CM

## 2012-10-19 DIAGNOSIS — G238 Other specified degenerative diseases of basal ganglia: Secondary | ICD-10-CM

## 2012-10-19 DIAGNOSIS — M546 Pain in thoracic spine: Secondary | ICD-10-CM

## 2012-10-19 DIAGNOSIS — E1143 Type 2 diabetes mellitus with diabetic autonomic (poly)neuropathy: Secondary | ICD-10-CM

## 2012-10-19 DIAGNOSIS — M542 Cervicalgia: Secondary | ICD-10-CM

## 2012-10-19 NOTE — Progress Notes (Signed)
Subjective:    Patient ID: Nicholas Barnett, male    DOB: [birth year], 79 y.o.   MRN: [id number]  Chief Complaint  Patient presents with  . Establish Care    HPI Type 2 diabetes mellitus with autonomic neuropathy: Diabetes in good control. He has gastroparesis. Some nausea is associated with this.  HYPERLIPIDEMIA: Controlled  Dysphagia, idiopathic: Mild  Recurrent falls: Parkinson plus syndrome and unstable gait previous situation where he is at risk for further falls.  Dizzy: Dizzy with postural changes.  Neck pain: Chronic issue of arthritis in the back  Right knee pain: Arthritis in knees  Thoracic back pain: Chronic secondary arthritis  Lumbar back pain: Chronic secondary to arthritis  Shoulder pain, unspecified laterality: Chronic secondary to arthritis  Cyanocobalamin deficiency: Receives monthly B12 injections  Parkinson's plus syndrome: Not currently medicated  Diabetic polyneuropathy: Using gabapentin.    Current Outpatient Prescriptions on File Prior to Visit  Medication Sig Dispense Refill  . aspirin 81 MG tablet Take 81 mg by mouth every morning.       . Calcium Carbonate-Vitamin D (CALCIUM-CARB 600 + D) 600-125 MG-UNIT TABS Take 2 tablets by mouth every morning.       . cyanocobalamin (,VITAMIN B-12,) 1000 MCG/ML injection Inject 1,000 mcg into the muscle every 30 (thirty) days.       Marland Kitchen gabapentin (NEURONTIN) 100 MG capsule TAKE ONE TO THREE CAPSULES BY MOUTH EVERY DAY AT NIGHT  90 capsule  5  . glipiZIDE (GLUCOTROL XL) 5 MG 24 hr tablet Take 1 tablet (5 mg total) by mouth daily.  90 tablet  4  . metFORMIN (GLUCOPHAGE) 1000 MG tablet TAKE ONE TABLET BY MOUTH TWICE DAILY WITH FOOD  180 tablet  1  . simvastatin (ZOCOR) 40 MG tablet Take 1 tablet (40 mg total) by mouth at bedtime.  90 tablet  3  . tamsulosin (FLOMAX) 0.4 MG CAPS Take 1 capsule (0.4 mg total) by mouth at bedtime.  90 capsule  3   No current facility-administered medications on file prior to  visit.    Review of Systems  Constitutional: Positive for appetite change (Loss of appetite) and fatigue.       Losing weight  HENT: Positive for hearing loss (Wearing hearing aids). Negative for ear pain.        Edentulous  Eyes: Negative.   Respiratory: Positive for cough and shortness of breath.   Cardiovascular: Negative for chest pain, palpitations and leg swelling.  Gastrointestinal: Positive for constipation.       History of diverticulitis. Mild dysphagia.  Endocrine:       Diabetic.  Genitourinary:       Weak stream. Occasional urinary incontinence.  Musculoskeletal: Positive for arthralgias, back pain, gait problem, myalgias, neck pain and neck stiffness.  Skin: Negative for pallor, rash and wound.       Itching  Allergic/Immunologic: Negative.   Neurological: Positive for dizziness, tremors and weakness (Generalized).       Memory loss. Confusion.  Hematological:       Petechia  Psychiatric/Behavioral: Positive for sleep disturbance, dysphoric mood and agitation. The patient is nervous/anxious.        Objective:   Physical Exam  Constitutional: He is oriented to person, place, and time.  Thin. Fragile. Elderly.  HENT:  Head: Normocephalic and atraumatic.  Right Ear: External ear normal.  Left Ear: External ear normal.  Nose: Nose normal.  Mouth/Throat: Oropharynx is clear and moist.  Loss of hearing. Hearing aids. Edentulous.  Eyes: Conjunctivae and EOM are normal. Pupils are equal, round, and reactive to light.  Neck: No JVD present. No tracheal deviation present. No thyromegaly present.  Cardiovascular: Normal rate, regular rhythm, normal heart sounds and intact distal pulses.  Exam reveals no gallop and no friction rub.   No murmur heard. Pulmonary/Chest: Breath sounds normal. No respiratory distress. He has no wheezes. He has no rales.  Abdominal: He exhibits no distension and no mass. There is no tenderness.  Musculoskeletal: Normal range of motion. He  exhibits no edema and no tenderness.  Lymphadenopathy:    He has no cervical adenopathy.  Neurological: He is alert and oriented to person, place, and time. He displays abnormal reflex. No cranial nerve deficit. He exhibits normal muscle tone. Coordination normal.  Diminished DTR bilaterally and patella..  Skin: No rash noted. No erythema. No pallor.  Psychiatric: He has a normal mood and affect. His behavior is normal. Judgment and thought content normal.      No visits with results within 3 Month(s) from this visit. Latest known visit with results is:  Office Visit on 05/13/2012  Component Date Value Range Status  . TSH 05/13/2012 0.98  0.35 - 5.50 uIU/mL Final  . Cholesterol 05/13/2012 130  0 - 200 mg/dL Final   ATP III Classification       Desirable:  < 200 mg/dL               Borderline High:  200 - 239 mg/dL          High:  > = 161 mg/dL  . Triglycerides 05/13/2012 217.0* 0.0 - 149.0 mg/dL Final   Normal:  <096 mg/dLBorderline High:  150 - 199 mg/dL  . HDL 05/13/2012 36.90* >39.00 mg/dL Final  . VLDL 04/54/0981 43.4* 0.0 - 40.0 mg/dL Final  . Total CHOL/HDL Ratio 05/13/2012 4   Final                  Men          Women1/2 Average Risk     3.4          3.3Average Risk          5.0          4.42X Average Risk          9.6          7.13X Average Risk          15.0          11.0                      . Hemoglobin A1C 05/13/2012 6.1  4.6 - 6.5 % Final   Glycemic Control Guidelines for People with Diabetes:Non Diabetic:  <6%Goal of Therapy: <7%Additional Action Suggested:  >8%   . Sodium 05/13/2012 140  135 - 145 mEq/L Final  . Potassium 05/13/2012 4.0  3.5 - 5.1 mEq/L Final  . Chloride 05/13/2012 102  96 - 112 mEq/L Final  . CO2 05/13/2012 30  19 - 32 mEq/L Final  . Glucose, Bld 05/13/2012 149* 70 - 99 mg/dL Final  . BUN 19/14/7829 14  6 - 23 mg/dL Final  . Creatinine, Ser 05/13/2012 1.1  0.4 - 1.5 mg/dL Final  . Total Bilirubin 05/13/2012 1.2  0.3 - 1.2 mg/dL Final  . Alkaline  Phosphatase 05/13/2012 64  39 - 117 U/L Final  . AST 05/13/2012 20  0 - 37 U/L Final  . ALT  05/13/2012 19  0 - 53 U/L Final  . Total Protein 05/13/2012 7.1  6.0 - 8.3 g/dL Final  . Albumin 16/11/9602 4.2  3.5 - 5.2 g/dL Final  . Calcium 54/10/8117 10.0  8.4 - 10.5 mg/dL Final  . GFR 14/78/2956 67.95  >60.00 mL/min Final  . WBC 05/13/2012 10.2  4.5 - 10.5 K/uL Final  . RBC 05/13/2012 5.12  4.22 - 5.81 Mil/uL Final  . Hemoglobin 05/13/2012 16.4  13.0 - 17.0 g/dL Final  . HCT 21/30/8657 47.7  39.0 - 52.0 % Final  . MCV 05/13/2012 93.2  78.0 - 100.0 fl Final  . MCHC 05/13/2012 34.3  30.0 - 36.0 g/dL Final  . RDW 84/69/6295 13.4  11.5 - 14.6 % Final  . Platelets 05/13/2012 239.0  150.0 - 400.0 K/uL Final  . Neutrophils Relative % 05/13/2012 67.0  43.0 - 77.0 % Final  . Lymphocytes Relative 05/13/2012 24.9  12.0 - 46.0 % Final  . Monocytes Relative 05/13/2012 7.1  3.0 - 12.0 % Final  . Eosinophils Relative 05/13/2012 0.8  0.0 - 5.0 % Final  . Basophils Relative 05/13/2012 0.2  0.0 - 3.0 % Final  . Neutro Abs 05/13/2012 6.8  1.4 - 7.7 K/uL Final  . Lymphs Abs 05/13/2012 2.5  0.7 - 4.0 K/uL Final  . Monocytes Absolute 05/13/2012 0.7  0.1 - 1.0 K/uL Final  . Eosinophils Absolute 05/13/2012 0.1  0.0 - 0.7 K/uL Final  . Basophils Absolute 05/13/2012 0.0  0.0 - 0.1 K/uL Final  . Microalb, Ur 05/13/2012 79.4* 0.0 - 1.9 mg/dL Final  . Creatinine,U 28/41/3244 418.6   Final  . Microalb Creat Ratio 05/13/2012 19.0  0.0 - 30.0 mg/g Final  . Direct LDL 05/13/2012 68.6   Final   Optimal:  <100 mg/dLNear or Above Optimal:  100-129 mg/dLBorderline High:  130-159 mg/dLHigh:  160-189 mg/dLVery High:  >190 mg/dL       Assessment & Plan:   Type 2 diabetes mellitus with autonomic : continue current medication  HYPERLIPIDEMIA: controlled  Dysphagia, idiopathic: obsereve  Recurrent falls: as a result of Parkinsonism  Dizzy: observe  Neck pain:   Right knee pain: Tylenol  prn  Thoracic back pain:  Tylenol  prn  Lumbar back pain: Tylenol  prn  Shoulder pain, unspecified laterality: Tylenol  prn  Cyanocobalamin deficiency: continue monthly injection  Parkinson's plus syndrome: observe. Seeing Dr. Marjory Lies.

## 2012-10-19 NOTE — Patient Instructions (Signed)
Continue medications as listed 

## 2012-11-04 ENCOUNTER — Ambulatory Visit (INDEPENDENT_AMBULATORY_CARE_PROVIDER_SITE_OTHER): Payer: BC Managed Care – PPO | Admitting: Emergency Medicine

## 2012-11-04 ENCOUNTER — Emergency Department (HOSPITAL_COMMUNITY)
Admission: EM | Admit: 2012-11-04 | Discharge: 2012-11-04 | Disposition: A | Discharge status: Home / Self Care | Payer: Medicare Other | Attending: Emergency Medicine | Admitting: Emergency Medicine

## 2012-11-04 ENCOUNTER — Encounter (HOSPITAL_COMMUNITY): Payer: Self-pay | Admitting: *Deleted

## 2012-11-04 ENCOUNTER — Encounter: Payer: Self-pay | Admitting: Emergency Medicine

## 2012-11-04 ENCOUNTER — Telehealth: Payer: Self-pay | Admitting: *Deleted

## 2012-11-04 ENCOUNTER — Emergency Department (HOSPITAL_COMMUNITY): Payer: Medicare Other

## 2012-11-04 VITALS — BP 90/60 | HR 77 | Temp 98.6°F

## 2012-11-04 DIAGNOSIS — I10 Essential (primary) hypertension: Secondary | ICD-10-CM | POA: Insufficient documentation

## 2012-11-04 DIAGNOSIS — Z79899 Other long term (current) drug therapy: Secondary | ICD-10-CM | POA: Insufficient documentation

## 2012-11-04 DIAGNOSIS — Z8659 Personal history of other mental and behavioral disorders: Secondary | ICD-10-CM | POA: Insufficient documentation

## 2012-11-04 DIAGNOSIS — R509 Fever, unspecified: Secondary | ICD-10-CM

## 2012-11-04 DIAGNOSIS — R209 Unspecified disturbances of skin sensation: Secondary | ICD-10-CM | POA: Insufficient documentation

## 2012-11-04 DIAGNOSIS — Z7982 Long term (current) use of aspirin: Secondary | ICD-10-CM | POA: Insufficient documentation

## 2012-11-04 DIAGNOSIS — M1990 Unspecified osteoarthritis, unspecified site: Secondary | ICD-10-CM | POA: Insufficient documentation

## 2012-11-04 DIAGNOSIS — Z8669 Personal history of other diseases of the nervous system and sense organs: Secondary | ICD-10-CM | POA: Insufficient documentation

## 2012-11-04 DIAGNOSIS — R5383 Other fatigue: Secondary | ICD-10-CM

## 2012-11-04 DIAGNOSIS — R630 Anorexia: Secondary | ICD-10-CM | POA: Insufficient documentation

## 2012-11-04 DIAGNOSIS — R5381 Other malaise: Secondary | ICD-10-CM

## 2012-11-04 DIAGNOSIS — E119 Type 2 diabetes mellitus without complications: Secondary | ICD-10-CM | POA: Insufficient documentation

## 2012-11-04 DIAGNOSIS — Z87891 Personal history of nicotine dependence: Secondary | ICD-10-CM | POA: Insufficient documentation

## 2012-11-04 DIAGNOSIS — IMO0001 Reserved for inherently not codable concepts without codable children: Secondary | ICD-10-CM | POA: Insufficient documentation

## 2012-11-04 DIAGNOSIS — G2581 Restless legs syndrome: Secondary | ICD-10-CM | POA: Insufficient documentation

## 2012-11-04 DIAGNOSIS — E785 Hyperlipidemia, unspecified: Secondary | ICD-10-CM | POA: Insufficient documentation

## 2012-11-04 DIAGNOSIS — M810 Age-related osteoporosis without current pathological fracture: Secondary | ICD-10-CM | POA: Insufficient documentation

## 2012-11-04 DIAGNOSIS — N390 Urinary tract infection, site not specified: Secondary | ICD-10-CM | POA: Insufficient documentation

## 2012-11-04 HISTORY — DX: Disorder of thyroid, unspecified: E07.9

## 2012-11-04 HISTORY — DX: Age-related osteoporosis without current pathological fracture: M81.0

## 2012-11-04 HISTORY — DX: Unspecified osteoarthritis, unspecified site: M19.90

## 2012-11-04 HISTORY — DX: Vitamin D deficiency, unspecified: E55.9

## 2012-11-04 LAB — URINALYSIS W MICROSCOPIC + REFLEX CULTURE
Hgb urine dipstick: NEGATIVE
Protein, ur: 30 mg/dL — AB
Urobilinogen, UA: 1 mg/dL (ref 0.0–1.0)

## 2012-11-04 LAB — COMPREHENSIVE METABOLIC PANEL
ALT: 8 U/L (ref 0–53)
AST: 11 U/L (ref 0–37)
CO2: 28 mEq/L (ref 19–32)
Chloride: 98 mEq/L (ref 96–112)
GFR calc non Af Amer: 77 mL/min — ABNORMAL LOW (ref >90)
Potassium: 3.9 mEq/L (ref 3.5–5.1)
Sodium: 134 mEq/L — ABNORMAL LOW (ref 135–145)
Total Bilirubin: 0.5 mg/dL (ref 0.3–1.2)

## 2012-11-04 LAB — CBC WITH DIFFERENTIAL/PLATELET
Basophils Absolute: 0 10*3/uL (ref 0.0–0.1)
HCT: 42.4 % (ref 39.0–52.0)
Lymphocytes Relative: 17 % (ref 12–46)
Neutro Abs: 7.8 10*3/uL — ABNORMAL HIGH (ref 1.7–7.7)
Platelets: 204 10*3/uL (ref 150–400)
RDW: 12.9 % (ref 11.5–15.5)
WBC: 10.6 10*3/uL — ABNORMAL HIGH (ref 4.0–10.5)

## 2012-11-04 LAB — POCT CBC
Hemoglobin: 15.3 g/dL (ref 14.1–18.1)
Lymph, poc: 1.4 (ref 0.6–3.4)
MCHC: 32.1 g/dL (ref 31.8–35.4)
MPV: 9.3 fL (ref 0–99.8)
POC Granulocyte: 9.1 — AB (ref 2–6.9)
POC MID %: 5.2 %M (ref 0–12)
RDW, POC: 13.6 %

## 2012-11-04 MED ORDER — SODIUM CHLORIDE 0.9 % IV BOLUS (SEPSIS)
500.0000 mL | Freq: Once | INTRAVENOUS | Status: AC
  Administered 2012-11-04: 500 mL via INTRAVENOUS

## 2012-11-04 MED ORDER — DEXTROSE 5 % IV SOLN
1.0000 g | Freq: Once | INTRAVENOUS | Status: AC
  Administered 2012-11-04: 1 g via INTRAVENOUS
  Filled 2012-11-04: qty 10

## 2012-11-04 MED ORDER — CEPHALEXIN 500 MG PO CAPS: 500.0000 mg | ORAL_CAPSULE | Freq: Four times a day (QID) | ORAL | Status: DC

## 2012-11-04 NOTE — ED Notes (Signed)
Bed: WA07 Expected date:  Expected time:  Means of arrival:  Comments: ems hypotension

## 2012-11-04 NOTE — Telephone Encounter (Signed)
Noted thanks °

## 2012-11-04 NOTE — ED Provider Notes (Signed)
CSN: [id number]     Arrival date & time 11/04/12  1307 History   First MD Initiated Contact with Patient 11/04/12 1319     Chief Complaint  Patient presents with  . Weakness   (Consider location/radiation/quality/duration/timing/severity/associated sxs/prior Treatment) HPI Nicholas Barnett is a 79 y.o. male who presents to emergency department with complaint of weakness and fevers at home. Patient and his family states that symptoms began last night. Patient has had mild URI symptoms and cough which resolved about a week ago. He has been doing well for a week. Since yesterday he felt feverish and has had increased weakness and decreased appetite. He lives with patient to an urgent care today where he was seen and sent over here for further evaluation. Patient was found to have a low blood pressure of 90/60 and lower oxygen saturation 94% on room air. Patient denies any acute complaints. States that he does have pain in his neck in his extremities which is chronic. Patient denies any chest pain, shortness of breath, abdominal pain, nausea, vomiting. Past Medical History  Diagnosis Date  . Diabetes mellitus   . Hyperlipidemia   . Hypertension   . Arthritis   . Diverticulitis   . Parkinsonism   . Parkinsonism   . Memory loss   . Depression   . Gait disturbance   . Restless leg   . Thyroid disease   . Vitamin D deficiency   . Osteoarthritis   . Osteoporosis    History reviewed. No pertinent past surgical history. Family History  Problem Relation Age of Onset  . Heart disease Mother   . Diabetes Mother   . Stroke Mother   . Cancer Father     prostate  . Diabetes Sister   . Heart disease Brother    History  Substance Use Topics  . Smoking status: Former Games developer  . Smokeless tobacco: Never Used     Comment: quit 30 years ago   . Alcohol Use: No    Review of Systems  Constitutional: Positive for fever, chills, activity change, appetite change and fatigue.  HENT: Negative for  neck pain and neck stiffness.   Respiratory: Negative.  Negative for cough, chest tightness and shortness of breath.   Cardiovascular: Negative.  Negative for chest pain, palpitations and leg swelling.  Gastrointestinal: Negative for nausea, vomiting, abdominal pain, diarrhea and abdominal distention.  Genitourinary: Negative for dysuria, urgency, frequency and hematuria.  Musculoskeletal: Positive for myalgias and arthralgias.  Skin: Negative for rash.  Allergic/Immunologic: Negative for immunocompromised state.  Neurological: Positive for weakness and numbness. Negative for dizziness, light-headedness and headaches.    Allergies  Caffeine  Home Medications   Current Outpatient Rx  Name  Route  Sig  Dispense  Refill  . amLODipine (NORVASC) 5 MG tablet   Oral   Take 5 mg by mouth daily.          Marland Kitchen aspirin 81 MG tablet   Oral   Take 81 mg by mouth every morning.          . benazepril (LOTENSIN) 5 MG tablet   Oral   Take 5 mg by mouth daily.         . Calcium Carbonate-Vitamin D (CALCIUM-CARB 600 + D) 600-125 MG-UNIT TABS   Oral   Take 2 tablets by mouth every morning.          . cyanocobalamin (,VITAMIN B-12,) 1000 MCG/ML injection   Intramuscular   Inject 1,000 mcg into the muscle  every 30 (thirty) days.          Marland Kitchen gabapentin (NEURONTIN) 100 MG capsule   Oral   Take 200 mg by mouth daily with supper.         Marland Kitchen glipiZIDE (GLUCOTROL XL) 5 MG 24 hr tablet   Oral   Take 1 tablet (5 mg total) by mouth daily.   90 tablet   4   . metFORMIN (GLUCOPHAGE) 1000 MG tablet   Oral   Take 1,000 mg by mouth 2 (two) times daily with a meal.         . simvastatin (ZOCOR) 40 MG tablet   Oral   Take 1 tablet (40 mg total) by mouth at bedtime.   90 tablet   3    BP 112/70  Pulse 79  Temp(Src) 98.4 F (36.9 C) (Oral)  Resp 20  Ht 6' (1.829 m)  Wt 183 lb (83.008 kg)  BMI 24.81 kg/m2  SpO2 96% Physical Exam  Nursing note and vitals reviewed. Constitutional:  He is oriented to person, place, and time. He appears well-developed and well-nourished. No distress.  HENT:  Head: Normocephalic and atraumatic.  Eyes: Conjunctivae are normal.  Neck: Neck supple.  Cardiovascular: Normal rate, regular rhythm and normal heart sounds.   Pulmonary/Chest: Effort normal. No respiratory distress. He has no wheezes. He has no rales.  Abdominal: Soft. Bowel sounds are normal. He exhibits no distension. There is no tenderness. There is no rebound.  Musculoskeletal: He exhibits no edema.  Neurological: He is alert and oriented to person, place, and time. No cranial nerve deficit. Coordination normal.  Skin: Skin is warm and dry.    ED Course  Procedures (including critical care time)  Results for orders placed during the hospital encounter of 11/04/12  CBC WITH DIFFERENTIAL      Result Value Range   WBC 10.6 (*) 4.0 - 10.5 K/uL   RBC 4.56  4.22 - 5.81 MIL/uL   Hemoglobin 14.6  13.0 - 17.0 g/dL   HCT 98.1  19.1 - 47.8 %   MCV 93.0  78.0 - 100.0 fL   MCH 32.0  26.0 - 34.0 pg   MCHC 34.4  30.0 - 36.0 g/dL   RDW 29.5  62.1 - 30.8 %   Platelets 204  150 - 400 K/uL   Neutrophils Relative % 73  43 - 77 %   Neutro Abs 7.8 (*) 1.7 - 7.7 K/uL   Lymphocytes Relative 17  12 - 46 %   Lymphs Abs 1.8  0.7 - 4.0 K/uL   Monocytes Relative 9  3 - 12 %   Monocytes Absolute 1.0  0.1 - 1.0 K/uL   Eosinophils Relative 0  0 - 5 %   Eosinophils Absolute 0.0  0.0 - 0.7 K/uL   Basophils Relative 0  0 - 1 %   Basophils Absolute 0.0  0.0 - 0.1 K/uL  COMPREHENSIVE METABOLIC PANEL      Result Value Range   Sodium 134 (*) 135 - 145 mEq/L   Potassium 3.9  3.5 - 5.1 mEq/L   Chloride 98  96 - 112 mEq/L   CO2 28  19 - 32 mEq/L   Glucose, Bld 99  70 - 99 mg/dL   BUN 16  6 - 23 mg/dL   Creatinine, Ser 6.57  0.50 - 1.35 mg/dL   Calcium 84.6  8.4 - 96.2 mg/dL   Total Protein 7.1  6.0 - 8.3 g/dL   Albumin 3.5  3.5 - 5.2 g/dL   AST 11  0 - 37 U/L   ALT 8  0 - 53 U/L   Alkaline  Phosphatase 73  39 - 117 U/L   Total Bilirubin 0.5  0.3 - 1.2 mg/dL   GFR calc non Af Amer 77 (*) >90 mL/min   GFR calc Af Amer 89 (*) >90 mL/min  URINALYSIS W MICROSCOPIC + REFLEX CULTURE      Result Value Range   Color, Urine ORANGE (*) YELLOW   APPearance CLOUDY (*) CLEAR   Specific Gravity, Urine 1.041 (*) 1.005 - 1.030   pH 5.0  5.0 - 8.0   Glucose, UA NEGATIVE  NEGATIVE mg/dL   Hgb urine dipstick NEGATIVE  NEGATIVE   Bilirubin Urine SMALL (*) NEGATIVE   Ketones, ur 15 (*) NEGATIVE mg/dL   Protein, ur 30 (*) NEGATIVE mg/dL   Urobilinogen, UA 1.0  0.0 - 1.0 mg/dL   Nitrite NEGATIVE  NEGATIVE   Leukocytes, UA MODERATE (*) NEGATIVE   WBC, UA 21-50  <3 WBC/hpf   Bacteria, UA FEW (*) RARE   Squamous Epithelial / LPF RARE  RARE   Casts HYALINE CASTS (*) NEGATIVE  CG4 I-STAT (LACTIC ACID)      Result Value Range   Lactic Acid, Venous 1.65  0.5 - 2.2 mmol/L  POCT I-STAT TROPONIN I      Result Value Range   Troponin i, poc 0.01  0.00 - 0.08 ng/mL   Comment 3            Dg Chest 2 View  11/04/2012   *RADIOLOGY REPORT*  Clinical Data: Weakness.  Low grade fever.  History of aspiration.  CHEST - 2 VIEW  Comparison: 08/01/2009.  Findings: No segmental consolidation.  Central pulmonary vascular prominence without pulmonary edema.  No pneumothorax.  Calcified tortuous aorta.  Heart size within normal limits.  Prominent bilateral shoulder joint degenerative changes.  IMPRESSION: No segmental consolidation.  Central pulmonary vascular prominence without pulmonary edema.  Calcified tortuous aorta.   Original Report Authenticated By: Lacy Duverney, M.D.      MDM  No diagnosis found.  Patient with fever at home, chills, weakness, blood pressure of 90/60 at his doctor's office. Blood work and urinalysis ordered.  The results of all the tests are above.  4:03 PM I discussed results with pt and his family. Whole time in ED BP normal. He is afebrile. He is in no distress. Lactic acid 1.65,  WBC slightly up at 10.6. The patient and his family admission to the hospital for IV antibiotics and labs. The patient and his family declined if possible. Family wants to take patient home. I have given him 1 g of Rocephin IV in emergency department. He will be discharged home with Keflex. Strict precautions given to return if unable to get fever down, nausea, vomiting, abdominal pain, back pain, confusion or altered mental status. Family voiced understanding.  Filed Vitals:   11/04/12 1321 11/04/12 1338 11/04/12 1340  BP: 105/73 123/69 112/70  Pulse: 67 65 79  Temp: 98.4 F (36.9 C)    TempSrc: Oral    Resp: 20    Height: 6' (1.829 m)    Weight: 183 lb (83.008 kg)    SpO2: 96%         Kelsie Zaborowski A Pranish Akhavan, PA-C 11/04/12 1610

## 2012-11-04 NOTE — Progress Notes (Deleted)
  Subjective:    Patient ID: Nicholas Barnett, male    DOB: September 19, 1931, 79 y.o.   MRN: [id number]  HPI 79 year old male patient presents with fever, hypotension, fatigue and cough. Patient states that he has not slept well over the last 28 hours. Patients wife states that he had a study done at [facility] in 2012 that says he aspirates when he eats. History of UTI over the last year.  Family Doctor: Dr. Frederik Pear   Review of Systems     Objective:   Physical Exam        Assessment & Plan:

## 2012-11-04 NOTE — Progress Notes (Signed)
  Subjective:    Patient ID: Nicholas Barnett, male    DOB: [birth year], 79 y.o.   MRN: [id number]  HPI patient had onset of symptoms about 48 hours ago but mainly worse yesterday. He began feeling extremely weak fatigued. He has had an intermittent fever through the day yesterday. According to the wife yesterday he felt extremely hot took 2 Tylenol and about 2 hours after that had a temperature of 100. Patient needs maximal assistance when walking. He feels like he will collapse if he tries to walk 2. He is brought in to the office emergently in a wheelchair.    Review of Systems     Objective:   Physical Exam patient appears ill but not toxic. He is not in any acute distress here. There is evidence of a small blister to the entrance of the right ear canal. Throat is normal. Chest exam reveals rows in the right lower lobe. Cardiac exam reveals slight irregularity but no murmurs the abdomen is soft nontender. Extremities are without edema and there is no evidence of cellulitis. It took maximum assistance of 2 providers to get the patient from the wheelchair onto the exam table.        Assessment & Plan:  Patient presents with fever extreme fatigue myalgias. He does have a history of aspiration and does have rales in his right lower lobe so would worry about a possible aspiration pneumonia. These will be started he will be transported to the hospital for further evaluation of his hypertension and history of fever and fatigue his blood pressures low systolic of 90. He is on Norvasc and Lotensin for blood pressure

## 2012-11-04 NOTE — ED Notes (Addendum)
Pt comes in from urgent care via EMS; was hypotensive with bp 90/60. Pt has been feeling weak; pt had elevated WBC and has also been having fevers.

## 2012-11-04 NOTE — ED Provider Notes (Signed)
Patient signed out to me by Kirichenko, PA-C.  Plan is to discharge following rocephin.  Patient and his family would like to go home following abx.  The option was given to the patient to be admitted, but this was declined.  The patient has been seen by Dr. Fredderick Phenix, who agrees with the plan.  However, if the patient becomes febrile or hypotensive again after abx, admission to the hospital will be encouraged.  Filed Vitals:   11/04/12 1648  BP: 126/75  Pulse: 67  Temp: 98.5 F (36.9 C)  Resp: 16    Vitals are stable.  Patient still would like to go home.  Feel that this is appropriate.  Discharge to home per the plan.  Lemont Fillers has prepared the paperwork and discharge abx.   Roxy Horseman, PA-C 11/04/12 1650

## 2012-11-04 NOTE — ED Notes (Signed)
Per family, pt has not been getting around good for the past few days and just has not felt well.

## 2012-11-05 ENCOUNTER — Other Ambulatory Visit: Payer: Self-pay | Admitting: *Deleted

## 2012-11-05 MED ORDER — "SYRINGE 25G X 1"" 3 ML MISC": Status: DC

## 2012-11-05 NOTE — ED Provider Notes (Signed)
Medical screening examination/treatment/procedure(s) were conducted as a shared visit with non-physician practitioner(s) and myself.  I personally evaluated the patient during the encounter Pt with generalized weakness, fevers, mildly hypotensive at Urgent Care.  No suggestion of sepsis here.    Rolan Bucco, MD 11/05/12 1400

## 2012-11-06 LAB — URINE CULTURE

## 2012-11-07 ENCOUNTER — Ambulatory Visit (INDEPENDENT_AMBULATORY_CARE_PROVIDER_SITE_OTHER): Payer: BC Managed Care – PPO | Admitting: Nurse Practitioner

## 2012-11-07 ENCOUNTER — Encounter: Payer: Self-pay | Admitting: Nurse Practitioner

## 2012-11-07 VITALS — BP 120/76 | HR 72 | Temp 98.3°F | Wt 185.0 lb

## 2012-11-07 DIAGNOSIS — N390 Urinary tract infection, site not specified: Secondary | ICD-10-CM

## 2012-11-07 DIAGNOSIS — Z23 Encounter for immunization: Secondary | ICD-10-CM

## 2012-11-07 MED ORDER — "SYRINGE 25G X 1"" 3 ML MISC": Status: DC

## 2012-11-07 NOTE — Progress Notes (Signed)
Patient ID: VALGENE DELOATCH, male   DOB: [birth year], 79 y.o.   MRN: [id number]   Allergies  Allergen Reactions  . Caffeine     Chief Complaint  Patient presents with  . Hospitalization Follow-up    ER follow-up: seen for UTI   . Immunizations    discuss if ok to get flu vaccine today     HPI: Patient is a 79 y.o.  Male seen in the office today for follow up after visit to the ER due to fever and weakness. Was diagnosed with UTI and was placed on keflex 500mg  4 times a day. Has been taking this without any problems.  Wife reports he has been without fever and the weakness has improved from acute episode however he has generalized weakness at baseline. Was having confusion prior to ED but this has improved Wife reports his appetite has been stable.  No fevers, chills, no chest pain, no shortness of breath, no abdominal pain, no N/V/D Review of Systems:  Review of Systems  Constitutional: Negative for fever and chills.  Respiratory: Negative for cough and shortness of breath.   Cardiovascular: Negative for chest pain, palpitations and leg swelling.  Gastrointestinal: Negative for abdominal pain, diarrhea and constipation.  Genitourinary: Negative for dysuria, urgency and frequency.  Neurological: Positive for weakness (weakness at baseline).     Past Medical History  Diagnosis Date  . Diabetes mellitus   . Hyperlipidemia   . Hypertension   . Arthritis   . Diverticulitis   . Parkinsonism   . Parkinsonism   . Memory loss   . Depression   . Gait disturbance   . Restless leg   . Thyroid disease   . Vitamin D deficiency   . Osteoarthritis   . Osteoporosis    History reviewed. No pertinent past surgical history. Social History:   reports that he has quit smoking. He has never used smokeless tobacco. He reports that he does not drink alcohol or use illicit drugs.  Family History  Problem Relation Age of Onset  . Heart disease Mother   . Diabetes Mother   . Stroke Mother    . Cancer Father     prostate  . Diabetes Sister   . Heart disease Brother     Medications: Patient's Medications  New Prescriptions   No medications on file  Previous Medications   AMLODIPINE (NORVASC) 5 MG TABLET    Take 5 mg by mouth daily.    ASPIRIN 81 MG TABLET    Take 81 mg by mouth every morning.    BENAZEPRIL (LOTENSIN) 5 MG TABLET    Take 5 mg by mouth daily.   CALCIUM CARBONATE-VITAMIN D (CALCIUM-CARB 600 + D) 600-125 MG-UNIT TABS    Take 2 tablets by mouth every morning.    CEPHALEXIN (KEFLEX) 500 MG CAPSULE    Take 1 capsule (500 mg total) by mouth 4 (four) times daily.   CYANOCOBALAMIN (,VITAMIN B-12,) 1000 MCG/ML INJECTION    Inject 1,000 mcg into the muscle every 30 (thirty) days.    GABAPENTIN (NEURONTIN) 100 MG CAPSULE    Take 200 mg by mouth daily with supper.   GLIPIZIDE (GLUCOTROL XL) 5 MG 24 HR TABLET    Take 1 tablet (5 mg total) by mouth daily.   METFORMIN (GLUCOPHAGE) 1000 MG TABLET    Take 1,000 mg by mouth 2 (two) times daily with a meal.   SIMVASTATIN (ZOCOR) 40 MG TABLET    Take 1 tablet (40 mg  total) by mouth at bedtime.   SYRINGE/NEEDLE, DISP, (SYRINGE 3CC/25GX1") 25G X 1" 3 ML MISC    Use as directed with Cyanocobalmin  Modified Medications   No medications on file  Discontinued Medications   No medications on file     Physical Exam:  Filed Vitals:   11/07/12 1312  BP: 120/76  Pulse: 72  Temp: 98.3 F (36.8 C)  TempSrc: Oral  Weight: 185 lb (83.915 kg)    Physical Exam  Constitutional: He is well-developed, well-nourished, and in no distress. No distress.  Cardiovascular: Normal rate, regular rhythm and normal heart sounds.   Pulmonary/Chest: Effort normal and breath sounds normal.  Abdominal: Soft. Bowel sounds are normal. He exhibits no distension. There is no tenderness.  Neurological: He is alert.  Skin: Skin is warm and dry. He is not diaphoretic.  Psychiatric: Affect normal.     Labs reviewed: Basic Metabolic Panel:  Recent  Labs  02/18/12 2245 05/13/12 1051 11/04/12 1425  NA 137 140 134*  K 4.1 4.0 3.9  CL 98 102 98  CO2 23 30 28   GLUCOSE 203* 149* 99  BUN 18 14 16   CREATININE 0.85 1.1 0.95  CALCIUM 10.5 10.0 10.2  TSH  --  0.98  --    Liver Function Tests:  Recent Labs  02/18/12 2245 05/13/12 1051 11/04/12 1425  AST 24 20 11   ALT 16 19 8   ALKPHOS 88 64 73  BILITOT 0.7 1.2 0.5  PROT 7.9 7.1 7.1  ALBUMIN 4.3 4.2 3.5    Recent Labs  02/18/12 2245  LIPASE 24   No results found for this basename: AMMONIA,  in the last 8760 hours CBC:  Recent Labs  02/18/12 2245 05/13/12 1051 11/04/12 1241 11/04/12 1425  WBC 14.7* 10.2 11.1* 10.6*  NEUTROABS 13.3* 6.8  --  7.8*  HGB 17.1* 16.4 15.3 14.6  HCT 48.1 47.7 47.7 42.4  MCV 89.7 93.2 99.2* 93.0  PLT 256 239.0  --  204   Lipid Panel:  Recent Labs  05/13/12 1051  CHOL 130  HDL 36.90*  TRIG 217.0*  CHOLHDL 4  LDLDIRECT 68.6    Assessment/Plan 1. UTI (urinary tract infection) Improved; to cont antibiotic; call if confusion or weakness gets worse or if pt has fevers or chills; to take probiotic to prevent diarrhea while on antibiotic   2. Need for prophylactic vaccination and inoculation against influenza Flu vaccine given

## 2012-11-07 NOTE — Patient Instructions (Addendum)
May use a probiotic daily to prevent diarrhea while on antibiotic Follow up as needed if symptoms return    Urinary Tract Infection Urinary tract infections (UTIs) can develop anywhere along your urinary tract. Your urinary tract is your body's drainage system for removing wastes and extra water. Your urinary tract includes two kidneys, two ureters, a bladder, and a urethra. Your kidneys are a pair of bean-shaped organs. Each kidney is about the size of your fist. They are located below your ribs, one on each side of your spine. CAUSES Infections are caused by microbes, which are microscopic organisms, including fungi, viruses, and bacteria. These organisms are so small that they can only be seen through a microscope. Bacteria are the microbes that most commonly cause UTIs. SYMPTOMS  Symptoms of UTIs may vary by age and gender of the patient and by the location of the infection. Symptoms in young women typically include a frequent and intense urge to urinate and a painful, burning feeling in the bladder or urethra during urination. Older women and men are more likely to be tired, shaky, and weak and have muscle aches and abdominal pain. A fever may mean the infection is in your kidneys. Other symptoms of a kidney infection include pain in your back or sides below the ribs, nausea, and vomiting. DIAGNOSIS To diagnose a UTI, your caregiver will ask you about your symptoms. Your caregiver also will ask to provide a urine sample. The urine sample will be tested for bacteria and white blood cells. White blood cells are made by your body to help fight infection. TREATMENT  Typically, UTIs can be treated with medication. Because most UTIs are caused by a bacterial infection, they usually can be treated with the use of antibiotics. The choice of antibiotic and length of treatment depend on your symptoms and the type of bacteria causing your infection. HOME CARE INSTRUCTIONS  If you were prescribed  antibiotics, take them exactly as your caregiver instructs you. Finish the medication even if you feel better after you have only taken some of the medication.  Drink enough water and fluids to keep your urine clear or pale yellow.  Avoid caffeine, tea, and carbonated beverages. They tend to irritate your bladder.  Empty your bladder often. Avoid holding urine for long periods of time.  Empty your bladder before and after sexual intercourse.  After a bowel movement, women should cleanse from front to back. Use each tissue only once. SEEK MEDICAL CARE IF:   You have back pain.  You develop a fever.  Your symptoms do not begin to resolve within 3 days. SEEK IMMEDIATE MEDICAL CARE IF:   You have severe back pain or lower abdominal pain.  You develop chills.  You have nausea or vomiting.  You have continued burning or discomfort with urination. MAKE SURE YOU:   Understand these instructions.  Will watch your condition.  Will get help right away if you are not doing well or get worse. Document Released: 11/19/2004 Document Revised: 08/11/2011 Document Reviewed: 03/20/2011 [facility] Patient Information 2014 [place], [place].

## 2012-11-08 ENCOUNTER — Encounter: Payer: Self-pay | Admitting: Diagnostic Neuroimaging

## 2012-11-09 NOTE — ED Provider Notes (Signed)
Medical screening examination/treatment/procedure(s) were performed by non-physician practitioner and as supervising physician I was immediately available for consultation/collaboration.   Claudean Kinds, MD 11/09/12 [phone]

## 2012-11-24 ENCOUNTER — Encounter: Payer: Self-pay | Admitting: Diagnostic Neuroimaging

## 2012-11-24 ENCOUNTER — Ambulatory Visit (INDEPENDENT_AMBULATORY_CARE_PROVIDER_SITE_OTHER): Payer: BC Managed Care – PPO | Admitting: Diagnostic Neuroimaging

## 2012-11-24 VITALS — BP 140/76 | HR 83 | Temp 97.6°F | Ht 70.0 in | Wt 183.0 lb

## 2012-11-24 DIAGNOSIS — G232 Striatonigral degeneration: Secondary | ICD-10-CM

## 2012-11-24 DIAGNOSIS — G238 Other specified degenerative diseases of basal ganglia: Secondary | ICD-10-CM

## 2012-11-24 DIAGNOSIS — R269 Unspecified abnormalities of gait and mobility: Secondary | ICD-10-CM

## 2012-11-24 DIAGNOSIS — R1319 Other dysphagia: Secondary | ICD-10-CM

## 2012-11-24 DIAGNOSIS — G20C Parkinsonism, unspecified: Secondary | ICD-10-CM | POA: Insufficient documentation

## 2012-11-24 NOTE — Progress Notes (Signed)
[facility]  PATIENT: Nicholas Barnett DOB: 1931/08/14  REFERRING CLINICIAN:  HISTORY FROM: patient and wife REASON FOR VISIT: follow up   HISTORICAL  CHIEF COMPLAINT:  Chief Complaint  Patient presents with  . Establish Care    Transfer of care from Dr Sandria Manly    HISTORY OF PRESENT ILLNESS:   UPDATE 11/24/12: Since last visit, symptoms have progressed. Walking and leg function much worse. Larey Seat in August 2014, but did not need hospitalization. C/o bursitis pain in both shoulders. Uses a single point cane. Is losing weight. Diff with swallowing getting worse.  PRIOR HPI (02/12/12, Dr. Sandria Manly): 79 year old right-handed white married male seen 06/13/2009 and 08/01/2009 with symptoms beginning 01/2009 of dizziness, weakness, difficulty getting up, falling, and dragging his legs. This began at a time he developed a rash over his hips, shoulders, neck, and the top of his head. Lyme titer 08/02/2009 was negative. He has dizziness on standing. He has hoarseness. He has  a chronic cough. He states his walking is the same. He uses a cane. He stumbles and  drags his feet. He has no falls. He has rare bowel or bladder incontinence. He occasionally has nausea. He has bladder frequency. He has pain in both shoulders, right knee, and the right foot is swollen. Physical therapy was discontinued because he has pain with exercise. He is independent in his activities of daily living. MRI of the brain 03-20-11shows pronounced general atrophy,chronic small vessel changes, and calcification or iron deposits in the basal ganglia, including the red nuclei, cerebral peduncles, and dentate nuclei. Workup with cervical MRI 5/4/2011showed multilevel biforaminal stenosis and no central spinal stenosis at any visualized level from the skull base down to T3-4.Anti-gad-65, RPR, CPK and B12 revealed a low B12 of 181, methylmalonic acid of 494. He is on B12 shots.He tapered off of carbidopa/levodopa because of lack of  benefit .There is a positive family history of dementia in a brother who died at 70. He had a swallowing study 08/13/2009 showing moderate dysphagia with intermittent aspiration of thin liquids. This showed progressive swallowing dysfunction. It was recommended that he consume several small meals during the day. He has not had any recurrent swallowing difficulties, but has coughing episodes.   Patient has a history of lower back pain beginning at age 18 when a cow struck him against a tree. He has annually had lower back pain that does not radiate into his legs. He awakens every morning with back pain which lasts about 10 min.His wife thinks that he is walking better and has more energy since the B12 shots. He mows his grass,,but has difficulty dressing himself and is using depends. He can bathe himself, take care of his toilet,needs, and  feed himself He has difficulty with buttons. He complains of feeling lightheaded without spinning or nausea and vomiting. It occurs in all positions 24 hours per day, 7 days per week.There is nothing which makes it worse or better. He uses a single-point cane to walk. His falls assessment tool score is 12. He is independent in activities of daily living but does not drive a car,shop, cook, clean,or handle his financial affairs. He gives himself his medication. He complains of difficulty sleeping at night. He has numbness in his feet and hands.He denies foot pain.Marland KitchenHe does not exercise. He snores at night He has symptoms of restless leg and his wife describes periodic leg movements.  He has right foot swelling. He has bursitis in his right and left shoulder.He has  right knee pain.he denies memory loss, hallucinations, or delirium.He has bilateral hearing aids.It takes him 2 hours to go to sleep. He discontinued ropinirole as a trial for restless leg syndrome and took 0.5 milligrams once at night.  REVIEW OF SYSTEMS: Full 14 system review of systems performed and notable only for  wight loss, hearing loss, cough, wheezing, joint pain, incontinence.  ALLERGIES: Allergies  Allergen Reactions  . Caffeine     HOME MEDICATIONS: Outpatient Prescriptions Prior to Visit  Medication Sig Dispense Refill  . amLODipine (NORVASC) 5 MG tablet Take 5 mg by mouth daily.       Marland Kitchen aspirin 81 MG tablet Take 81 mg by mouth every morning.       . benazepril (LOTENSIN) 5 MG tablet Take 5 mg by mouth daily.      . Calcium Carbonate-Vitamin D (CALCIUM-CARB 600 + D) 600-125 MG-UNIT TABS Take 2 tablets by mouth every morning.       . cephALEXin (KEFLEX) 500 MG capsule Take 1 capsule (500 mg total) by mouth 4 (four) times daily.  28 capsule  0  . cyanocobalamin (,VITAMIN B-12,) 1000 MCG/ML injection Inject 1,000 mcg into the muscle every 30 (thirty) days.       Marland Kitchen gabapentin (NEURONTIN) 100 MG capsule Take 200 mg by mouth daily with supper.      Marland Kitchen glipiZIDE (GLUCOTROL XL) 5 MG 24 hr tablet Take 1 tablet (5 mg total) by mouth daily.  90 tablet  4  . metFORMIN (GLUCOPHAGE) 1000 MG tablet Take 1,000 mg by mouth 2 (two) times daily with a meal.      . simvastatin (ZOCOR) 40 MG tablet Take 1 tablet (40 mg total) by mouth at bedtime.  90 tablet  3  . Syringe/Needle, Disp, (SYRINGE 3CC/25GX1") 25G X 1" 3 ML MISC Use as directed with Cyanocobalmin  50 each  1   No facility-administered medications prior to visit.    PAST MEDICAL HISTORY: Past Medical History  Diagnosis Date  . Diabetes mellitus   . Hyperlipidemia   . Hypertension   . Arthritis   . Diverticulitis   . Parkinsonism   . Parkinsonism   . Memory loss   . Depression   . Gait disturbance   . Restless leg   . Thyroid disease   . Vitamin D deficiency   . Osteoarthritis   . Osteoporosis     PAST SURGICAL HISTORY: Past Surgical History  Procedure Laterality Date  . Cataract extraction  2012    FAMILY HISTORY: Family History  Problem Relation Age of Onset  . Heart disease Mother   . Diabetes Mother   . Stroke Mother     . Cancer Father     prostate  . Diabetes Sister   . Heart disease Brother     SOCIAL HISTORY:  History   Social History  . Marital Status: Married    Spouse Name: Kathie Rhodes    Number of Children: 3  . Years of Education: N/A   Occupational History  . Retired     Engineer, [employer]   Social History Main Topics  . Smoking status: Former Games developer  . Smokeless tobacco: Never Used     Comment: quit 30 years ago   . Alcohol Use: No  . Drug Use: No  . Sexual Activity: No   Other Topics Concern  . Not on file   Social History Narrative   Patient lives at home with his family.   Caffeine Use: none  PHYSICAL EXAM  Filed Vitals:   11/24/12 1252  BP: 140/76  Pulse: 83  Temp: 97.6 F (36.4 C)  TempSrc: Oral  Height: 5\' 10"  (1.778 m)  Weight: 183 lb (83.008 kg)    Not recorded    Body mass index is 26.26 kg/(m^2).  GENERAL EXAM: Patient is in no distress; MASKED FACIES. POSITIVE SNOUT. NEG MYERSONS.  CARDIOVASCULAR: Regular rate and rhythm, no murmurs, no carotid bruits  NEUROLOGIC: MENTAL STATUS: awake, alert, language fluent, comprehension intact, naming intact CRANIAL NERVE: pupils equal and reactive to light, visual fields full to confrontation, extraocular muscles intact, SACCADIC BREAKDOWN OF SMOOTH PURSUIT. no nystagmus, facial sensation and strength symmetric, uvula midline, shoulder shrug symmetric, tongue midline. MOD DYSARTHRIA, HOARSE, QUIET VOICE. MOTOR: RARE REST TREMOR IN RIGHT THUMB. INCR TONE IN BUE AND BLE. BUE BRADYKINETIC (PROX 3 LIMITED BY PAIN. DISTAL 5). RLE 2-3 WITH SIGNIFICANT BRADYKINESIA. LLE 4 WITH MILD BRADY. SENSORY: normal and symmetric to light touch COORDINATION: finger-nose-finger, fine finger movements SLOW GAIT/STATION: BARELY ABLE TO STAND. LEANS TO THE LEFT AND BACKWARDS. SIGNIFICANT GAIT APRAXIA AND FREEZING.   DIAGNOSTIC DATA (LABS, IMAGING, TESTING) - I reviewed patient records, labs, notes, testing and imaging myself  where available.  Lab Results  Component Value Date   WBC 10.6* 11/04/2012   HGB 14.6 11/04/2012   HCT 42.4 11/04/2012   MCV 93.0 11/04/2012   PLT 204 11/04/2012      Component Value Date/Time   NA 134* 11/04/2012 1425   K 3.9 11/04/2012 1425   CL 98 11/04/2012 1425   CO2 28 11/04/2012 1425   GLUCOSE 99 11/04/2012 1425   BUN 16 11/04/2012 1425   CREATININE 0.95 11/04/2012 1425   CALCIUM 10.2 11/04/2012 1425   PROT 7.1 11/04/2012 1425   ALBUMIN 3.5 11/04/2012 1425   AST 11 11/04/2012 1425   ALT 8 11/04/2012 1425   ALKPHOS 73 11/04/2012 1425   BILITOT 0.5 11/04/2012 1425   GFRNONAA 77* 11/04/2012 1425   GFRAA 89* 11/04/2012 1425   Lab Results  Component Value Date   CHOL 130 05/13/2012   HDL 36.90* 05/13/2012   LDLCALC 50 06/30/2011   LDLDIRECT 68.6 05/13/2012   TRIG 217.0* 05/13/2012   CHOLHDL 4 05/13/2012   Lab Results  Component Value Date   HGBA1C 6.1 05/13/2012   Lab Results  Component Value Date   VITAMINB12 1142* 10/15/2010   Lab Results  Component Value Date   TSH 0.98 05/13/2012    04/18/09 MRI BRAIN - moderate atrophy; moderate chronic small vessel ischemic disease.  06/26/09 MRI CERVICAL SPINE - Multilevel biforaminal stenosis ranging from mild to severe, due to uncovertebral joint hypertrophy. No spinal stenosis at any visualize level from skull base down to T3-4.  07/31/10 MRI THORACIC SPINE - Degenerative changes at T8-9 and T9-10. No spinal stenosis or foraminal narrowing.  07/31/10 MRI LUMBAR SPINE - mild spondylitic and facet degenerative changes with mild canal and biforaminal stenosis at L3-4 and L4-5   ASSESSMENT AND PLAN  79 y.o. year old male here with advanced parkinson's plus syndrome. No benefit with carb/levo or ropinirole.  PLAN: - home health eval (PT, OT, ST) - use walker; high fall risk -incr gabapentin to 300mg  qhs for RLS   Orders Placed This Encounter  Procedures  . Ambulatory referral to Home Health   Return in about 1 year (around 11/24/2013) for  with Heide Guile or Freeland Pracht.    Suanne Marker, MD 11/24/2012, 2:03 PM Certified in Neurology, Neurophysiology and  Neuroimaging  [facility] [address] [place], [place] [address] [phone]

## 2012-11-24 NOTE — Patient Instructions (Signed)
Use walker.  Increase gabapentin to 300mg  at bedtime for restless legs.

## 2012-12-02 ENCOUNTER — Telehealth: Payer: Self-pay | Admitting: Diagnostic Neuroimaging

## 2012-12-19 ENCOUNTER — Other Ambulatory Visit: Payer: Self-pay | Admitting: *Deleted

## 2012-12-19 MED ORDER — METFORMIN HCL 1000 MG PO TABS: 1000.0000 mg | ORAL_TABLET | Freq: Two times a day (BID) | ORAL | Status: DC

## 2012-12-26 ENCOUNTER — Encounter: Payer: Self-pay | Admitting: Internal Medicine

## 2012-12-26 DIAGNOSIS — M546 Pain in thoracic spine: Secondary | ICD-10-CM | POA: Insufficient documentation

## 2012-12-26 DIAGNOSIS — E538 Deficiency of other specified B group vitamins: Secondary | ICD-10-CM | POA: Insufficient documentation

## 2012-12-26 DIAGNOSIS — H9193 Unspecified hearing loss, bilateral: Secondary | ICD-10-CM | POA: Insufficient documentation

## 2012-12-26 DIAGNOSIS — M545 Low back pain: Secondary | ICD-10-CM | POA: Insufficient documentation

## 2012-12-26 DIAGNOSIS — R296 Repeated falls: Secondary | ICD-10-CM | POA: Insufficient documentation

## 2012-12-26 DIAGNOSIS — M25519 Pain in unspecified shoulder: Secondary | ICD-10-CM | POA: Insufficient documentation

## 2012-12-26 DIAGNOSIS — R1310 Dysphagia, unspecified: Secondary | ICD-10-CM | POA: Insufficient documentation

## 2012-12-26 DIAGNOSIS — M25561 Pain in right knee: Secondary | ICD-10-CM | POA: Insufficient documentation

## 2012-12-26 DIAGNOSIS — R42 Dizziness and giddiness: Secondary | ICD-10-CM | POA: Insufficient documentation

## 2012-12-26 DIAGNOSIS — M542 Cervicalgia: Secondary | ICD-10-CM | POA: Insufficient documentation

## 2012-12-26 DIAGNOSIS — E1143 Type 2 diabetes mellitus with diabetic autonomic (poly)neuropathy: Secondary | ICD-10-CM | POA: Insufficient documentation

## 2012-12-26 DIAGNOSIS — G2581 Restless legs syndrome: Secondary | ICD-10-CM | POA: Insufficient documentation

## 2013-01-25 ENCOUNTER — Ambulatory Visit (INDEPENDENT_AMBULATORY_CARE_PROVIDER_SITE_OTHER): Payer: BC Managed Care – PPO | Admitting: Internal Medicine

## 2013-01-25 ENCOUNTER — Encounter: Payer: Self-pay | Admitting: Internal Medicine

## 2013-01-25 VITALS — BP 150/84 | HR 80 | Temp 97.4°F | Resp 20 | Ht 70.0 in | Wt 183.2 lb

## 2013-01-25 DIAGNOSIS — L0390 Cellulitis, unspecified: Secondary | ICD-10-CM | POA: Insufficient documentation

## 2013-01-25 DIAGNOSIS — E1143 Type 2 diabetes mellitus with diabetic autonomic (poly)neuropathy: Secondary | ICD-10-CM

## 2013-01-25 DIAGNOSIS — E785 Hyperlipidemia, unspecified: Secondary | ICD-10-CM

## 2013-01-25 DIAGNOSIS — M25519 Pain in unspecified shoulder: Secondary | ICD-10-CM | POA: Insufficient documentation

## 2013-01-25 DIAGNOSIS — R269 Unspecified abnormalities of gait and mobility: Secondary | ICD-10-CM

## 2013-01-25 DIAGNOSIS — E1149 Type 2 diabetes mellitus with other diabetic neurological complication: Secondary | ICD-10-CM

## 2013-01-25 DIAGNOSIS — M25512 Pain in left shoulder: Secondary | ICD-10-CM

## 2013-01-25 DIAGNOSIS — G909 Disorder of the autonomic nervous system, unspecified: Secondary | ICD-10-CM

## 2013-01-25 DIAGNOSIS — I10 Essential (primary) hypertension: Secondary | ICD-10-CM

## 2013-01-25 DIAGNOSIS — L0291 Cutaneous abscess, unspecified: Secondary | ICD-10-CM

## 2013-01-25 MED ORDER — BENAZEPRIL HCL 5 MG PO TABS: ORAL_TABLET | ORAL | Status: DC

## 2013-01-25 NOTE — Patient Instructions (Signed)
Call if you want the right shoulder injected.

## 2013-01-26 ENCOUNTER — Other Ambulatory Visit: Payer: Self-pay | Admitting: *Deleted

## 2013-01-26 ENCOUNTER — Telehealth: Payer: Self-pay | Admitting: *Deleted

## 2013-01-26 DIAGNOSIS — R269 Unspecified abnormalities of gait and mobility: Secondary | ICD-10-CM

## 2013-01-26 MED ORDER — GABAPENTIN 100 MG PO CAPS: ORAL_CAPSULE | ORAL | Status: DC

## 2013-01-26 MED ORDER — BENAZEPRIL HCL 10 MG PO TABS: ORAL_TABLET | ORAL | Status: DC

## 2013-01-26 NOTE — Telephone Encounter (Signed)
Placed order in Referral Workqueue

## 2013-01-26 NOTE — Telephone Encounter (Signed)
Refer for bathing assistance. Needs home PT to train in appropriate use of walker and in strengthening exercises.

## 2013-01-26 NOTE — Telephone Encounter (Signed)
Patient wife called and stated they would like a referral for home health to come to home. She states she needs some help in caring for him. Please Advise and what would you like for them to do.

## 2013-02-04 DIAGNOSIS — R269 Unspecified abnormalities of gait and mobility: Secondary | ICD-10-CM

## 2013-02-04 DIAGNOSIS — E119 Type 2 diabetes mellitus without complications: Secondary | ICD-10-CM

## 2013-02-04 DIAGNOSIS — I10 Essential (primary) hypertension: Secondary | ICD-10-CM

## 2013-02-04 DIAGNOSIS — G20 Parkinson's disease: Secondary | ICD-10-CM

## 2013-02-26 NOTE — Progress Notes (Signed)
Patient ID: Nicholas Barnett, male   DOB: 15-Jan-1932, 80 y.o.   MRN: [id number]    Location:    [facility]  Place of Service:  OFFICE    Allergies  Allergen Reactions  . Caffeine     Chief Complaint  Patient presents with  . Follow-up    bursitis in L/R shoulder is having problems(? shots). feet swelling occassionally    HPI:  Pain in joint, shoulder region, left: History of injections in the shoulder by Dr. Joni Fears worked in the past. He does have other injections by Dr. Army Melia in the shoulder that did not work. Although both shoulders are painful, the left is the worst.  Gait difficulty:feeble and unsteady. Chronic knee pains. Parkinson plus syndrome with unstable gait. Dizzy with postural changes.  HYPERTENSION: Mild systolic blood pressure elevation  Cellulitis: Left elbow injury has some inflammation associated with an ulnar bursitis.  Type 2 diabetes mellitus with autonomic neuropathy: Stable at this time  Hyperlipidemia: Controlled    Medications: Patient's Medications  New Prescriptions   BENAZEPRIL (LOTENSIN) 10 MG TABLET    Take one tablet by mouth once daily for blood pressure  Previous Medications   AMLODIPINE (NORVASC) 5 MG TABLET    Take 5 mg by mouth daily.    ASPIRIN 81 MG TABLET    Take 81 mg by mouth every morning.    CALCIUM CARBONATE-VITAMIN D (CALCIUM-CARB 600 + D) 600-125 MG-UNIT TABS    Take 2 tablets by mouth every morning.    CYANOCOBALAMIN (,VITAMIN B-12,) 1000 MCG/ML INJECTION    Inject 1,000 mcg into the muscle every 30 (thirty) days.    GLIPIZIDE (GLUCOTROL XL) 5 MG 24 HR TABLET    Take 1 tablet (5 mg total) by mouth daily.   METFORMIN (GLUCOPHAGE) 1000 MG TABLET    Take 1 tablet (1,000 mg total) by mouth 2 (two) times daily with a meal.   SIMVASTATIN (ZOCOR) 40 MG TABLET    Take 1 tablet (40 mg total) by mouth at bedtime.   SYRINGE/NEEDLE, DISP, (SYRINGE 3CC/25GX1") 25G X 1" 3 ML MISC    Use as directed with Cyanocobalmin   TAMSULOSIN (FLOMAX)  0.4 MG CAPS CAPSULE    Take 1 capsule by mouth at bedtime.  Modified Medications   Modified Medication Previous Medication   GABAPENTIN (NEURONTIN) 100 MG CAPSULE gabapentin (NEURONTIN) 100 MG capsule      Take three tablet once daily with supper for restless leg    Take 200 mg by mouth daily with supper.  Discontinued Medications   BENAZEPRIL (LOTENSIN) 5 MG TABLET    Take 5 mg by mouth daily.   BENAZEPRIL (LOTENSIN) 5 MG TABLET    One daily to control BP   CEPHALEXIN (KEFLEX) 500 MG CAPSULE    Take 1 capsule (500 mg total) by mouth 4 (four) times daily.     Review of Systems  Constitutional: Positive for appetite change (Loss of appetite) and fatigue.       Losing weight  HENT: Positive for hearing loss (Wearing hearing aids) and voice change (Voice is weaker.). Negative for ear pain.        Edentulous  Eyes: Negative.   Respiratory: Positive for cough (Related to recurrent aspiration) and shortness of breath.   Cardiovascular: Negative for chest pain, palpitations and leg swelling.  Gastrointestinal: Positive for constipation.       History of diverticulitis. Mild dysphagia.  Endocrine:       Diabetic.  Genitourinary:  Weak stream. Occasional urinary incontinence.  Musculoskeletal: Positive for arthralgias, back pain, gait problem, myalgias, neck pain and neck stiffness.  Skin: Negative for pallor, rash and wound.       Itching  Allergic/Immunologic: Negative.   Neurological: Positive for dizziness, tremors and weakness (Generalized).       Memory loss. Confusion.  Hematological:       Petechia  Psychiatric/Behavioral: Positive for sleep disturbance, dysphoric mood and agitation. The patient is nervous/anxious.     Filed Vitals:   01/25/13 1320  BP: 150/84  Pulse: 80  Temp: 97.4 F (36.3 C)  TempSrc: Oral  Resp: 20  Height: '5\' 10"'  (1.778 m)  Weight: 183 lb 3.2 oz (83.099 kg)  SpO2: 99%   Physical Exam  Constitutional: He is oriented to person, place, and  time.  Thin. Fragile. Elderly.  HENT:  Head: Normocephalic and atraumatic.  Right Ear: External ear normal.  Left Ear: External ear normal.  Nose: Nose normal.  Mouth/Throat: Oropharynx is clear and moist.  Loss of hearing. Hearing aids. Edentulous.  Eyes: Conjunctivae and EOM are normal. Pupils are equal, round, and reactive to light.  Neck: No JVD present. No tracheal deviation present. No thyromegaly present.  Cardiovascular: Normal rate, regular rhythm, normal heart sounds and intact distal pulses.  Exam reveals no gallop and no friction rub.   No murmur heard. Pulmonary/Chest: Breath sounds normal. No respiratory distress. He has no wheezes. He has no rales.  Abdominal: He exhibits no distension and no mass. There is no tenderness.  Musculoskeletal: He exhibits no edema and no tenderness.  Tender in both shoulders. Mild restriction of movement of the shoulders.  Lymphadenopathy:    He has no cervical adenopathy.  Neurological: He is alert and oriented to person, place, and time. He displays abnormal reflex. No cranial nerve deficit. He exhibits normal muscle tone. Coordination normal.  Diminished DTR bilaterally and patella..  Skin: No rash noted. No erythema. No pallor.  Psychiatric: He has a normal mood and affect. His behavior is normal. Judgment and thought content normal.     Labs reviewed: Admission on 11/04/2012, Discharged on 11/04/2012  Component Date Value Range Status  . WBC 11/04/2012 10.6* 4.0 - 10.5 K/uL Final  . RBC 11/04/2012 4.56  4.22 - 5.81 MIL/uL Final  . Hemoglobin 11/04/2012 14.6  13.0 - 17.0 g/dL Final  . HCT 11/04/2012 42.4  39.0 - 52.0 % Final  . MCV 11/04/2012 93.0  78.0 - 100.0 fL Final  . MCH 11/04/2012 32.0  26.0 - 34.0 pg Final  . MCHC 11/04/2012 34.4  30.0 - 36.0 g/dL Final  . RDW 11/04/2012 12.9  11.5 - 15.5 % Final  . Platelets 11/04/2012 204  150 - 400 K/uL Final  . Neutrophils Relative % 11/04/2012 73  43 - 77 % Final  . Neutro Abs  11/04/2012 7.8* 1.7 - 7.7 K/uL Final  . Lymphocytes Relative 11/04/2012 17  12 - 46 % Final  . Lymphs Abs 11/04/2012 1.8  0.7 - 4.0 K/uL Final  . Monocytes Relative 11/04/2012 9  3 - 12 % Final  . Monocytes Absolute 11/04/2012 1.0  0.1 - 1.0 K/uL Final  . Eosinophils Relative 11/04/2012 0  0 - 5 % Final  . Eosinophils Absolute 11/04/2012 0.0  0.0 - 0.7 K/uL Final  . Basophils Relative 11/04/2012 0  0 - 1 % Final  . Basophils Absolute 11/04/2012 0.0  0.0 - 0.1 K/uL Final  . Sodium 11/04/2012 134* 135 - 145 mEq/L  Final  . Potassium 11/04/2012 3.9  3.5 - 5.1 mEq/L Final  . Chloride 11/04/2012 98  96 - 112 mEq/L Final  . CO2 11/04/2012 28  19 - 32 mEq/L Final  . Glucose, Bld 11/04/2012 99  70 - 99 mg/dL Final  . BUN 11/04/2012 16  6 - 23 mg/dL Final  . Creatinine, Ser 11/04/2012 0.95  0.50 - 1.35 mg/dL Final  . Calcium 11/04/2012 10.2  8.4 - 10.5 mg/dL Final  . Total Protein 11/04/2012 7.1  6.0 - 8.3 g/dL Final  . Albumin 11/04/2012 3.5  3.5 - 5.2 g/dL Final  . AST 11/04/2012 11  0 - 37 U/L Final  . ALT 11/04/2012 8  0 - 53 U/L Final  . Alkaline Phosphatase 11/04/2012 73  39 - 117 U/L Final  . Total Bilirubin 11/04/2012 0.5  0.3 - 1.2 mg/dL Final  . GFR calc non Af Amer 11/04/2012 77* >90 mL/min Final  . GFR calc Af Amer 11/04/2012 89* >90 mL/min Final   Comment: (NOTE)                          The eGFR has been calculated using the CKD EPI equation.                          This calculation has not been validated in all clinical situations.                          eGFR's persistently <90 mL/min signify possible Chronic Kidney                          Disease.  . Color, Urine 11/04/2012 ORANGE* YELLOW Final   BIOCHEMICALS MAY BE AFFECTED BY COLOR  . APPearance 11/04/2012 CLOUDY* CLEAR Final  . Specific Gravity, Urine 11/04/2012 1.041* 1.005 - 1.030 Final  . pH 11/04/2012 5.0  5.0 - 8.0 Final  . Glucose, UA 11/04/2012 NEGATIVE  NEGATIVE mg/dL Final  . Hgb urine dipstick 11/04/2012  NEGATIVE  NEGATIVE Final  . Bilirubin Urine 11/04/2012 SMALL* NEGATIVE Final  . Ketones, ur 11/04/2012 15* NEGATIVE mg/dL Final  . Protein, ur 11/04/2012 30* NEGATIVE mg/dL Final  . Urobilinogen, UA 11/04/2012 1.0  0.0 - 1.0 mg/dL Final  . Nitrite 11/04/2012 NEGATIVE  NEGATIVE Final  . Leukocytes, UA 11/04/2012 MODERATE* NEGATIVE Final  . WBC, UA 11/04/2012 21-50  <3 WBC/hpf Final  . Bacteria, UA 11/04/2012 FEW* RARE Final  . Squamous Epithelial / LPF 11/04/2012 RARE  RARE Final  . Casts 11/04/2012 HYALINE CASTS* NEGATIVE Final  . Lactic Acid, Venous 11/04/2012 1.65  0.5 - 2.2 mmol/L Final  . Troponin i, poc 11/04/2012 0.01  0.00 - 0.08 ng/mL Final  . Comment 3 11/04/2012          Final   Comment: Due to the release kinetics of cTnI,                          a negative result within the first hours                          of the onset of symptoms does not rule out  myocardial infarction with certainty.                          If myocardial infarction is still suspected,                          repeat the test at appropriate intervals.  Marland Kitchen Specimen Description 11/04/2012 URINE, CLEAN CATCH   Final  . Special Requests 11/04/2012 NONE   Final  . Culture  Setup Time 11/04/2012    Final                   Value:11/05/2012 02:38                         Performed at [facility]  . Colony Count 11/04/2012    Final                   Value:>=100,000 COLONIES/ML                         Performed at [facility]  . Culture 11/04/2012    Final                   Value:Multiple bacterial morphotypes present, none predominant. Suggest appropriate recollection if clinically indicated.                         Performed at [facility]  . Report Status 11/04/2012 11/06/2012 FINAL   Final  Office Visit on 11/04/2012  Component Date Value Range Status  . POC Glucose 11/04/2012 120* 70 - 99 mg/dl Final  . WBC 11/04/2012 11.1* 4.6 - 10.2 K/uL Final  .  Lymph, poc 11/04/2012 1.4  0.6 - 3.4 Final  . POC LYMPH PERCENT 11/04/2012 12.6  10 - 50 %L Final  . MID (cbc) 11/04/2012 0.6  0 - 0.9 Final  . POC MID % 11/04/2012 5.2  0 - 12 %M Final  . POC Granulocyte 11/04/2012 9.1* 2 - 6.9 Final  . Granulocyte percent 11/04/2012 82.2* 37 - 80 %G Final  . RBC 11/04/2012 4.81  4.69 - 6.13 M/uL Final  . Hemoglobin 11/04/2012 15.3  14.1 - 18.1 g/dL Final  . HCT, POC 11/04/2012 47.7  43.5 - 53.7 % Final  . MCV 11/04/2012 99.2* 80 - 97 fL Final  . MCH, POC 11/04/2012 31.8* 27 - 31.2 pg Final  . MCHC 11/04/2012 32.1  31.8 - 35.4 g/dL Final  . RDW, POC 11/04/2012 13.6   Final  . Platelet Count, POC 11/04/2012 217  142 - 424 K/uL Final  . MPV 11/04/2012 9.3  0 - 99.8 fL Final      Assessment/Plan  Pain in joint, shoulder region, left: Injected the left shoulder and 1.5 cc lidocaine and 75 cc Kenalog. Patient hasn't experienced relief following this procedure.  Gait difficulty: Chronic and at high risk for falls. This problem is complicated. The major issue is a Parkinson plus syndrome, but he also has chronic knee pains and back pains he contributes his gait difficulty.  HYPERTENSION: Continue current medication.  Cellulitis: Resolving  Type 2 diabetes mellitus with autonomic neuropathy - Plan: Hemoglobin A1c, Comprehensive metabolic panel  Hyperlipidemia - Plan: Lipid panel

## 2013-03-14 ENCOUNTER — Other Ambulatory Visit: Payer: Self-pay | Admitting: *Deleted

## 2013-03-14 MED ORDER — GABAPENTIN 100 MG PO CAPS: ORAL_CAPSULE | ORAL | Status: DC

## 2013-03-14 MED ORDER — GLIPIZIDE ER 5 MG PO TB24: ORAL_TABLET | ORAL | Status: DC

## 2013-03-14 MED ORDER — AMLODIPINE BESYLATE 5 MG PO TABS
ORAL_TABLET | ORAL | Status: DC
Start: 2013-03-14 — End: 2013-03-14

## 2013-03-14 MED ORDER — TAMSULOSIN HCL 0.4 MG PO CAPS: ORAL_CAPSULE | ORAL | Status: DC

## 2013-03-14 MED ORDER — BENAZEPRIL HCL 5 MG PO TABS: ORAL_TABLET | ORAL | Status: DC

## 2013-03-14 MED ORDER — SIMVASTATIN 40 MG PO TABS: ORAL_TABLET | ORAL | Status: DC

## 2013-03-14 MED ORDER — "SYRINGE 25G X 1"" 3 ML MISC": Status: DC

## 2013-03-14 MED ORDER — TAMSULOSIN HCL 0.4 MG PO CAPS: 0.4000 mg | ORAL_CAPSULE | Freq: Every day | ORAL | Status: DC

## 2013-03-14 MED ORDER — METFORMIN HCL 1000 MG PO TABS: ORAL_TABLET | ORAL | Status: DC

## 2013-03-14 MED ORDER — SIMVASTATIN 40 MG PO TABS: 40.0000 mg | ORAL_TABLET | Freq: Every day | ORAL | Status: DC

## 2013-03-14 MED ORDER — AMLODIPINE BESYLATE 5 MG PO TABS: ORAL_TABLET | ORAL | Status: DC

## 2013-03-14 MED ORDER — AMLODIPINE BESYLATE 5 MG PO TABS
5.0000 mg | ORAL_TABLET | Freq: Every day | ORAL | Status: DC
Start: 2013-03-14 — End: 2013-03-14

## 2013-03-14 MED ORDER — CYANOCOBALAMIN 1000 MCG/ML IJ SOLN: 1000.0000 ug | INTRAMUSCULAR | Status: DC

## 2013-04-05 ENCOUNTER — Other Ambulatory Visit: Payer: Self-pay | Admitting: *Deleted

## 2013-04-05 MED ORDER — AMLODIPINE BESYLATE 5 MG PO TABS: ORAL_TABLET | ORAL | Status: DC

## 2013-04-05 MED ORDER — TAMSULOSIN HCL 0.4 MG PO CAPS: ORAL_CAPSULE | ORAL | Status: DC

## 2013-04-10 ENCOUNTER — Other Ambulatory Visit: Payer: Self-pay | Admitting: *Deleted

## 2013-04-10 ENCOUNTER — Other Ambulatory Visit: Payer: Medicare Other

## 2013-04-10 DIAGNOSIS — E079 Disorder of thyroid, unspecified: Secondary | ICD-10-CM

## 2013-04-10 DIAGNOSIS — E1149 Type 2 diabetes mellitus with other diabetic neurological complication: Secondary | ICD-10-CM

## 2013-04-10 DIAGNOSIS — I10 Essential (primary) hypertension: Secondary | ICD-10-CM

## 2013-04-10 DIAGNOSIS — E785 Hyperlipidemia, unspecified: Secondary | ICD-10-CM

## 2013-04-11 LAB — COMPREHENSIVE METABOLIC PANEL
A/G RATIO: 2 (ref 1.1–2.5)
ALT: 9 IU/L (ref 0–44)
AST: 16 IU/L (ref 0–40)
Albumin: 4.4 g/dL (ref 3.5–4.8)
Alkaline Phosphatase: 75 IU/L (ref 39–117)
BILIRUBIN TOTAL: 0.7 mg/dL (ref 0.0–1.2)
BUN/Creatinine Ratio: 22 (ref 10–22)
BUN: 18 mg/dL (ref 8–27)
CALCIUM: 9.9 mg/dL (ref 8.6–10.2)
CO2: 26 mmol/L (ref 18–29)
CREATININE: 0.83 mg/dL (ref 0.76–1.27)
Chloride: 104 mmol/L (ref 97–108)
GFR, EST AFRICAN AMERICAN: 97 mL/min/{1.73_m2} (ref >59)
GFR, EST NON AFRICAN AMERICAN: 84 mL/min/{1.73_m2} (ref >59)
GLOBULIN, TOTAL: 2.2 g/dL (ref 1.5–4.5)
GLUCOSE: 114 mg/dL — AB (ref 65–99)
POTASSIUM: 4.1 mmol/L (ref 3.5–5.2)
Sodium: 144 mmol/L (ref 134–144)
TOTAL PROTEIN: 6.6 g/dL (ref 6.0–8.5)

## 2013-04-11 LAB — LIPID PANEL
CHOL/HDL RATIO: 2.8 ratio (ref 0.0–5.0)
Cholesterol, Total: 139 mg/dL (ref 100–199)
HDL: 50 mg/dL (ref >39)
LDL CALC: 63 mg/dL (ref 0–99)
Triglycerides: 131 mg/dL (ref 0–149)
VLDL CHOLESTEROL CAL: 26 mg/dL (ref 5–40)

## 2013-04-11 LAB — HEMOGLOBIN A1C
ESTIMATED AVERAGE GLUCOSE: 120 mg/dL
HEMOGLOBIN A1C: 5.8 % — AB (ref 4.8–5.6)

## 2013-04-17 ENCOUNTER — Other Ambulatory Visit: Payer: Self-pay | Admitting: Internal Medicine

## 2013-04-26 ENCOUNTER — Encounter: Payer: Self-pay | Admitting: Internal Medicine

## 2013-04-26 ENCOUNTER — Ambulatory Visit (INDEPENDENT_AMBULATORY_CARE_PROVIDER_SITE_OTHER): Payer: BC Managed Care – PPO | Admitting: Internal Medicine

## 2013-04-26 VITALS — BP 144/70 | HR 82 | Temp 97.9°F | Resp 16 | Wt 183.2 lb

## 2013-04-26 DIAGNOSIS — L0291 Cutaneous abscess, unspecified: Secondary | ICD-10-CM

## 2013-04-26 DIAGNOSIS — M25569 Pain in unspecified knee: Secondary | ICD-10-CM

## 2013-04-26 DIAGNOSIS — M25519 Pain in unspecified shoulder: Secondary | ICD-10-CM

## 2013-04-26 DIAGNOSIS — L0390 Cellulitis, unspecified: Secondary | ICD-10-CM

## 2013-04-26 DIAGNOSIS — E1143 Type 2 diabetes mellitus with diabetic autonomic (poly)neuropathy: Secondary | ICD-10-CM

## 2013-04-26 DIAGNOSIS — G238 Other specified degenerative diseases of basal ganglia: Secondary | ICD-10-CM

## 2013-04-26 DIAGNOSIS — I10 Essential (primary) hypertension: Secondary | ICD-10-CM

## 2013-04-26 DIAGNOSIS — E1149 Type 2 diabetes mellitus with other diabetic neurological complication: Secondary | ICD-10-CM

## 2013-04-26 DIAGNOSIS — G232 Striatonigral degeneration: Secondary | ICD-10-CM

## 2013-04-26 DIAGNOSIS — Z9181 History of falling: Secondary | ICD-10-CM

## 2013-04-26 DIAGNOSIS — G909 Disorder of the autonomic nervous system, unspecified: Secondary | ICD-10-CM

## 2013-04-26 DIAGNOSIS — M25561 Pain in right knee: Secondary | ICD-10-CM

## 2013-04-26 DIAGNOSIS — Z23 Encounter for immunization: Secondary | ICD-10-CM

## 2013-04-26 DIAGNOSIS — E039 Hypothyroidism, unspecified: Secondary | ICD-10-CM

## 2013-04-26 DIAGNOSIS — E785 Hyperlipidemia, unspecified: Secondary | ICD-10-CM

## 2013-04-26 DIAGNOSIS — R296 Repeated falls: Secondary | ICD-10-CM

## 2013-04-26 DIAGNOSIS — R413 Other amnesia: Secondary | ICD-10-CM

## 2013-04-26 DIAGNOSIS — R1310 Dysphagia, unspecified: Secondary | ICD-10-CM

## 2013-04-26 MED ORDER — TETANUS-DIPHTH-ACELL PERTUSSIS 5-2.5-18.5 LF-MCG/0.5 IM SUSP: 0.5000 mL | Freq: Once | INTRAMUSCULAR | Status: DC

## 2013-04-26 NOTE — Progress Notes (Signed)
unable to due to Parkinsons Disease

## 2013-04-26 NOTE — Progress Notes (Signed)
Scored a 21 out 28, due to Parkinson Disease

## 2013-04-26 NOTE — Progress Notes (Signed)
Patient ID: Nicholas Barnett, male   DOB: Aug 15, 1931, 80 y.o.   MRN: [id number]    Location:    [facility]  Place of Service:  OFFICE  Extended Emergency Contact Information Primary Emergency Contact: Stony, Stegmann Address: [address], [place] [address] [place] Home Phone: [phone] Mobile Phone: [phone] Relation: Spouse Secondary Emergency Contact: Shore,Dori Address: [address] [place] [address] [place] Home Phone: [phone] Mobile Phone: [phone] Relation: Daughter    Allergies  Allergen Reactions  . Caffeine     Chief Complaint  Patient presents with  . Medical Managment of Chronic Issues    3 month f/u & discuss labs. (printed), MMSE  . other    ingestion alot, appeitite (snack food only), and sitting around alot due to RT knee pain  . Immunizations    Tdap to be printed.    HPI:   Type 2 diabetes mellitus with autonomic neuropathy: wwell controlled  HYPOTHYROIDISM: compensated  Cellulitis: resolved at left elbow  Dysphagia, idiopathic: chokes at many meals. At risk for aspiration.  HYPERLIPIDEMIA: controlled  HYPERTENSION: controlled  Memory loss: unchanged  Pain in joint, shoulder region: unchanged. Steroid injection last visit only lasted a  Few hours.  Parkinson's plus syndrome: unchanged  Right knee pain: contributes to fall risk  Recurrent falls: at risk for more falls  Need for prophylactic vaccination with combined diphtheria-tetanus-pertussis (DTP) vaccine - Plan: Tdap (BOOSTRIX) 5-2.5-18.5 LF-MCG/0.5 injection    Medications: Patient's Medications  New Prescriptions   No medications on file  Previous Medications   AMLODIPINE (NORVASC) 5 MG TABLET    Take one tablet by mouth once daily to control blood pressure   ASPIRIN 81 MG TABLET    Take 81 mg by mouth every morning.    BENAZEPRIL (LOTENSIN) 5 MG TABLET    Take one tablet by mouth once daily for blood  pressure   CALCIUM CARBONATE-VITAMIN D (CALCIUM-CARB 600 + D) 600-125 MG-UNIT TABS    Take 2 tablets by mouth every morning.    CYANOCOBALAMIN (,VITAMIN B-12,) 1000 MCG/ML INJECTION    Inject 1 mL (1,000 mcg total) into the muscle every 30 (thirty) days. For vitamin B12   GABAPENTIN (NEURONTIN) 100 MG CAPSULE    Take three tablet once daily with supper for restless leg   GLIPIZIDE (GLUCOTROL XL) 5 MG 24 HR TABLET    Take one tablet by mouth once daily to control blood sugar   METFORMIN (GLUCOPHAGE) 1000 MG TABLET    Take one tablet by mouth twice daily to control blood sugar   SIMVASTATIN (ZOCOR) 40 MG TABLET    Take one tablet by mouth once daily for cholesterol   SYRINGE/NEEDLE, DISP, (SYRINGE 3CC/25GX1") 25G X 1" 3 ML MISC    Use as directed with Cyanocobalmin   TAMSULOSIN (FLOMAX) 0.4 MG CAPS CAPSULE    Take one tablet by mouth once daily at bedtime to prevent urinary frequency   TDAP (BOOSTRIX) 5-2.5-18.5 LF-MCG/0.5 INJECTION    Inject 0.5 mLs into the muscle once.  Modified Medications   No medications on file  Discontinued Medications   AMLODIPINE (NORVASC) 5 MG TABLET    TAKE ONE TABLET BY MOUTH ONCE DAILY   TAMSULOSIN (FLOMAX) 0.4 MG CAPS CAPSULE    TAKE ONE CAPSULE BY MOUTH AT BEDTIME     Review of Systems  Constitutional: Positive  for appetite change (Loss of appetite) and fatigue.       Losing weight  HENT: Positive for hearing loss (Wearing hearing aids) and voice change (Voice is weaker.). Negative for ear pain.        Edentulous  Eyes: Negative.   Respiratory: Positive for cough (Related to recurrent aspiration) and shortness of breath.   Cardiovascular: Negative for chest pain, palpitations and leg swelling.  Gastrointestinal: Positive for constipation.       History of diverticulitis. Mild dysphagia.  Endocrine:       Diabetic.  Genitourinary:       Weak stream. Occasional urinary incontinence.  Musculoskeletal: Positive for arthralgias, back pain, gait problem,  myalgias, neck pain and neck stiffness.  Skin: Negative for pallor, rash and wound.       Itching  Allergic/Immunologic: Negative.   Neurological: Positive for dizziness, tremors and weakness (Generalized).       Memory loss. Confusion.  Hematological:       Petechia  Psychiatric/Behavioral: Positive for sleep disturbance, dysphoric mood and agitation. The patient is nervous/anxious.     Filed Vitals:   04/26/13 1242  BP: 144/70  Pulse: 82  Temp: 97.9 F (36.6 C)  TempSrc: Oral  Resp: 16  Weight: 183 lb 3.2 oz (83.099 kg)  SpO2: 97%   Physical Exam  Constitutional: He is oriented to person, place, and time.  Thin. Fragile. Elderly.  HENT:  Head: Normocephalic and atraumatic.  Right Ear: External ear normal.  Left Ear: External ear normal.  Nose: Nose normal.  Mouth/Throat: Oropharynx is clear and moist.  Loss of hearing. Hearing aids. Edentulous.  Eyes: Conjunctivae and EOM are normal. Pupils are equal, round, and reactive to light.  Neck: No JVD present. No tracheal deviation present. No thyromegaly present.  Cardiovascular: Normal rate, regular rhythm, normal heart sounds and intact distal pulses.  Exam reveals no gallop and no friction rub.   No murmur heard. Pulmonary/Chest: Breath sounds normal. No respiratory distress. He has no wheezes. He has no rales.  Abdominal: He exhibits no distension and no mass. There is no tenderness.  Musculoskeletal: He exhibits no edema and no tenderness.  Tender in both shoulders. Mild restriction of movement of the shoulders.  Lymphadenopathy:    He has no cervical adenopathy.  Neurological: He is alert and oriented to person, place, and time. He displays abnormal reflex. No cranial nerve deficit. He exhibits normal muscle tone. Coordination normal.  Diminished DTR bilaterally and patella..  Skin: No rash noted. No erythema. No pallor.  Psychiatric: He has a normal mood and affect. His behavior is normal. Judgment and thought content  normal.     Labs reviewed: Appointment on 04/10/2013  Component Date Value Ref Range Status  . Glucose 04/10/2013 114* 65 - 99 mg/dL Final  . BUN 69/62/9528 18  8 - 27 mg/dL Final  . Creatinine, Ser 04/10/2013 0.83  0.76 - 1.27 mg/dL Final  . GFR calc non Af Amer 04/10/2013 84  >59 mL/min/1.73 Final  . GFR calc Af Amer 04/10/2013 97  >59 mL/min/1.73 Final  . BUN/Creatinine Ratio 04/10/2013 22  10 - 22 Final  . Sodium 04/10/2013 144  134 - 144 mmol/L Final  . Potassium 04/10/2013 4.1  3.5 - 5.2 mmol/L Final  . Chloride 04/10/2013 104  97 - 108 mmol/L Final  . CO2 04/10/2013 26  18 - 29 mmol/L Final  . Calcium 04/10/2013 9.9  8.6 - 10.2 mg/dL Final  . Total Protein 04/10/2013 6.6  6.0 - 8.5 g/dL Final  . Albumin 16/11/9602 4.4  3.5 - 4.8 g/dL Final  . Globulin, Total 04/10/2013 2.2  1.5 - 4.5 g/dL Final  . Albumin/Globulin Ratio 04/10/2013 2.0  1.1 - 2.5 Final  . Total Bilirubin 04/10/2013 0.7  0.0 - 1.2 mg/dL Final  . Alkaline Phosphatase 04/10/2013 75  39 - 117 IU/L Final  . AST 04/10/2013 16  0 - 40 IU/L Final  . ALT 04/10/2013 9  0 - 44 IU/L Final  . Cholesterol, Total 04/10/2013 139  100 - 199 mg/dL Final  . Triglycerides 04/10/2013 131  0 - 149 mg/dL Final  . HDL 54/10/8117 50  >39 mg/dL Final   Comment: According to ATP-III Guidelines, HDL-C >59 mg/dL is considered a                          negative risk factor for CHD.  Marland Kitchen VLDL Cholesterol Cal 04/10/2013 26  5 - 40 mg/dL Final  . LDL Calculated 04/10/2013 63  0 - 99 mg/dL Final  . Chol/HDL Ratio 04/10/2013 2.8  0.0 - 5.0 ratio units Final   Comment:                                   T. Chol/HDL Ratio                                                                      Men  Women                                                        1/2 Avg.Risk  3.4    3.3                                                            Avg.Risk  5.0    4.4                                                         2X Avg.Risk  9.6    7.1                                                          3X Avg.Risk 23.4   11.0  . Hemoglobin A1C 04/10/2013 5.8* 4.8 - 5.6 % Final   Comment:          Increased risk for diabetes: 5.7 - 6.4  Diabetes: >6.4                                   Glycemic control for adults with diabetes: <7.0  . Estimated average glucose 04/10/2013 120   Final      Assessment/Plan  1. Type 2 diabetes mellitus with autonomic neuropathy - Hemoglobin A1c; Future - Basic metabolic panel; Future - Microalbumin / creatinine urine ratio; Future  2. HYPOTHYROIDISM compensated  3. Cellulitis resolved  4. Dysphagia, idiopathic At risk for aspiration  5. HYPERLIPIDEMIA controlled - Lipid panel; Future  6. HYPERTENSION controlled  7. Memory loss unchanged  8. Pain in joint, shoulder region unchanged  9. Parkinson's plus syndrome unchanged  10. Right knee pain unchanged  11. Recurrent falls At risk for more  12. Need for prophylactic vaccination with combined diphtheria-tetanus-pertussis (DTP) vaccine - Tdap (BOOSTRIX) 5-2.5-18.5 LF-MCG/0.5 injection; Inject 0.5 mLs into the muscle once.  Dispense: 0.5 mL; Refill: 0

## 2013-04-26 NOTE — Patient Instructions (Signed)
Continue medications as listed 

## 2013-05-30 ENCOUNTER — Encounter: Payer: Self-pay | Admitting: Internal Medicine

## 2013-08-21 ENCOUNTER — Other Ambulatory Visit: Payer: Self-pay | Admitting: *Deleted

## 2013-08-21 ENCOUNTER — Other Ambulatory Visit: Payer: Medicare Other

## 2013-08-21 DIAGNOSIS — R5381 Other malaise: Secondary | ICD-10-CM

## 2013-08-21 DIAGNOSIS — R3989 Other symptoms and signs involving the genitourinary system: Secondary | ICD-10-CM

## 2013-08-21 DIAGNOSIS — R5383 Other fatigue: Principal | ICD-10-CM

## 2013-08-22 LAB — URINALYSIS
Bilirubin, UA: NEGATIVE
Glucose, UA: NEGATIVE
Ketones, UA: NEGATIVE
Nitrite, UA: NEGATIVE
PROTEIN UA: NEGATIVE
RBC, UA: NEGATIVE
Specific Gravity, UA: 1.023 (ref 1.005–1.030)
UUROB: 1 mg/dL (ref 0.0–1.9)
pH, UA: 6.5 (ref 5.0–7.5)

## 2013-08-24 ENCOUNTER — Telehealth: Payer: Self-pay | Admitting: *Deleted

## 2013-08-24 ENCOUNTER — Other Ambulatory Visit: Payer: Self-pay | Admitting: *Deleted

## 2013-08-24 MED ORDER — CIPROFLOXACIN HCL 250 MG PO TABS: 250.0000 mg | ORAL_TABLET | Freq: Two times a day (BID) | ORAL | Status: DC

## 2013-08-24 NOTE — Telephone Encounter (Signed)
Message copied by Lamont SnowballICE, SHARON L on Thu Aug 24, 2013  5:54 PM ------      Message from: Kimber RelicGREEN, ARTHUR G      Created: Thu Aug 24, 2013 12:54 PM       Call patient. Urine infection is present. Prescribe ciprofloxacin 250 mg. Dispense 14 tablets. Sig: One twice daily to treat infection. ------

## 2013-08-24 NOTE — Telephone Encounter (Signed)
Called left message on phone regarding antibiotics sent to pharmacy.

## 2013-08-25 LAB — URINE CULTURE

## 2013-09-22 ENCOUNTER — Telehealth: Payer: Self-pay | Admitting: *Deleted

## 2013-09-22 NOTE — Telephone Encounter (Signed)
Received Incontinence Forms from [facility]. Called patient and they did request the supplies. Filled out forms and left for Dr. Chilton SiGreen to sign off.  To fax for back to Fax # [phone]

## 2013-10-13 ENCOUNTER — Other Ambulatory Visit (INDEPENDENT_AMBULATORY_CARE_PROVIDER_SITE_OTHER): Payer: BC Managed Care – PPO

## 2013-10-13 DIAGNOSIS — R350 Frequency of micturition: Secondary | ICD-10-CM

## 2013-10-13 DIAGNOSIS — E785 Hyperlipidemia, unspecified: Secondary | ICD-10-CM

## 2013-10-13 DIAGNOSIS — E1143 Type 2 diabetes mellitus with diabetic autonomic (poly)neuropathy: Secondary | ICD-10-CM

## 2013-10-13 DIAGNOSIS — R319 Hematuria, unspecified: Secondary | ICD-10-CM

## 2013-10-13 LAB — POCT URINALYSIS DIPSTICK
GLUCOSE UA: NEGATIVE
Nitrite, UA: NEGATIVE
SPEC GRAV UA: 1.015
Urobilinogen, UA: 0.2
pH, UA: 5

## 2013-10-14 LAB — BASIC METABOLIC PANEL
BUN/Creatinine Ratio: 18 (ref 10–22)
BUN: 19 mg/dL (ref 8–27)
CALCIUM: 10.1 mg/dL (ref 8.6–10.2)
CO2: 24 mmol/L (ref 18–29)
Chloride: 100 mmol/L (ref 97–108)
Creatinine, Ser: 1.05 mg/dL (ref 0.76–1.27)
GFR calc Af Amer: 77 mL/min/{1.73_m2} (ref >59)
GFR calc non Af Amer: 67 mL/min/{1.73_m2} (ref >59)
Glucose: 124 mg/dL — ABNORMAL HIGH (ref 65–99)
POTASSIUM: 4 mmol/L (ref 3.5–5.2)
Sodium: 143 mmol/L (ref 134–144)

## 2013-10-14 LAB — HEMOGLOBIN A1C
ESTIMATED AVERAGE GLUCOSE: 123 mg/dL
HEMOGLOBIN A1C: 5.9 % — AB (ref 4.8–5.6)

## 2013-10-14 LAB — LIPID PANEL
Chol/HDL Ratio: 2.7 ratio units (ref 0.0–5.0)
Cholesterol, Total: 132 mg/dL (ref 100–199)
HDL: 49 mg/dL (ref >39)
LDL Calculated: 56 mg/dL (ref 0–99)
Triglycerides: 136 mg/dL (ref 0–149)
VLDL Cholesterol Cal: 27 mg/dL (ref 5–40)

## 2013-10-14 LAB — URINE CULTURE

## 2013-10-17 ENCOUNTER — Ambulatory Visit (INDEPENDENT_AMBULATORY_CARE_PROVIDER_SITE_OTHER): Payer: Medicare Other | Admitting: Internal Medicine

## 2013-10-17 ENCOUNTER — Encounter: Payer: Self-pay | Admitting: Internal Medicine

## 2013-10-17 VITALS — BP 130/78 | HR 66 | Temp 97.9°F | Resp 10 | Ht 70.0 in | Wt 182.0 lb

## 2013-10-17 DIAGNOSIS — N498 Inflammatory disorders of other specified male genital organs: Secondary | ICD-10-CM

## 2013-10-17 DIAGNOSIS — R42 Dizziness and giddiness: Secondary | ICD-10-CM

## 2013-10-17 DIAGNOSIS — E1149 Type 2 diabetes mellitus with other diabetic neurological complication: Secondary | ICD-10-CM

## 2013-10-17 DIAGNOSIS — E538 Deficiency of other specified B group vitamins: Secondary | ICD-10-CM

## 2013-10-17 DIAGNOSIS — E785 Hyperlipidemia, unspecified: Secondary | ICD-10-CM

## 2013-10-17 DIAGNOSIS — I10 Essential (primary) hypertension: Secondary | ICD-10-CM

## 2013-10-17 DIAGNOSIS — G909 Disorder of the autonomic nervous system, unspecified: Secondary | ICD-10-CM

## 2013-10-17 DIAGNOSIS — G238 Other specified degenerative diseases of basal ganglia: Secondary | ICD-10-CM

## 2013-10-17 DIAGNOSIS — G232 Striatonigral degeneration: Secondary | ICD-10-CM

## 2013-10-17 DIAGNOSIS — G4700 Insomnia, unspecified: Secondary | ICD-10-CM

## 2013-10-17 DIAGNOSIS — E039 Hypothyroidism, unspecified: Secondary | ICD-10-CM

## 2013-10-17 DIAGNOSIS — G2581 Restless legs syndrome: Secondary | ICD-10-CM

## 2013-10-17 DIAGNOSIS — N492 Inflammatory disorders of scrotum: Secondary | ICD-10-CM

## 2013-10-17 DIAGNOSIS — M542 Cervicalgia: Secondary | ICD-10-CM

## 2013-10-17 DIAGNOSIS — E1143 Type 2 diabetes mellitus with diabetic autonomic (poly)neuropathy: Secondary | ICD-10-CM

## 2013-10-17 MED ORDER — CYANOCOBALAMIN 1000 MCG/ML IJ SOLN: INTRAMUSCULAR | Status: DC

## 2013-10-17 MED ORDER — MUPIROCIN 2 % EX OINT: TOPICAL_OINTMENT | CUTANEOUS | Status: DC

## 2013-10-17 NOTE — Progress Notes (Signed)
Patient ID: Nicholas Barnett, male   DOB: 01-Nov-1931, 80 y.o.   MRN: [id number]    Location:    [facility]  Place of Service:  OFFICE  Extended Emergency Contact Information Primary Emergency Contact: Sinclair, Alligood Address: [address], [place] [address] [place] Home Phone: [phone] Mobile Phone: [phone] Relation: Spouse Secondary Emergency Contact: Shore,Dori Address: [address], [place] [address] [place] Home Phone: [phone] Mobile Phone: [phone] Relation: Daughter   Chief Complaint  Patient presents with  . Medical Management of Chronic Issues    6 month follow-up and discuss labs (copy printed)   . Sleeping Problem    Trouble falling asleep   . Gastrophageal Reflux    More bothersome x couple months   . Dizziness    Ongoing concern   . Arthritis    Arthritis in neck   . Groin Swelling    Irritation in scrotum- onging concern     HPI:  HYPERTENSION: Controlled  HYPERLIPIDEMIA: Controlled  HYPOTHYROIDISM: Last checked in 2014. TSH 0.98 on 05/13/12  Parkinson's plus syndrome: Followed by Dr. Marjory Lies. Stable.  Restless legs syndrome (RLS): Improved  Type 2 diabetes mellitus with autonomic neuropathy: Controlled  Neck pain: Chronic condition which does not seem to interfere with daily activities  Insomnia: Stable  Dizzy: Intermittent and may be associated with position changes.  Cyanocobalamin deficiency: Prior history of B12 deficiency. Is getting monthly injections of vitamin B12 intramuscular  Cellulitis, scrotum: Resolved   Past Medical History  Diagnosis Date  . Type 2 diabetes mellitus with autonomic neuropathy   . Hyperlipidemia   . Hypertension   . Arthritis   . Diverticulitis   . Parkinsonism   . Right knee pain   . Memory loss   . Depression   . Gait disturbance   . Restless leg   . Thyroid disease   . Vitamin D deficiency   . Osteoarthritis   . Osteoporosis     . Dysphagia, idiopathic     History of aspiration  . Recurrent falls     Using a cane and walker  . Dizzy   . Hearing loss of both ears   . Neck pain   . Thoracic back pain   . Lumbar back pain   . Shoulder pain   . Cyanocobalamin deficiency   . Other and unspecified hyperlipidemia   . Unspecified essential hypertension   . Abnormality of gait   . Unspecified hypothyroidism   . Restless legs syndrome (RLS)     Past Surgical History  Procedure Laterality Date  . Cataract extraction Bilateral 2012    Dr. Hazle Quant    History   Social History  . Marital Status: Married    Spouse Name: Kathie Rhodes    Number of Children: 3  . Years of Education: N/A   Occupational History  . Retired     Engineer, [employer]   Social History Main Topics  . Smoking status: Former Games developer  . Smokeless tobacco: Never Used     Comment: quit 30 years ago   . Alcohol Use: No  . Drug Use: No  . Sexual Activity: No   Other Topics Concern  . Not on file   Social History Narrative   Married 1956. Returned from a Production designer, theatre/television/film for [employer].   Patient lives at home with his family.  Caffeine Use: none   Patient has a living will and healthcare power of attorney.     reports that he has quit smoking. He has never used smokeless tobacco. He reports that he does not drink alcohol or use illicit drugs.  Immunization History  Administered Date(s) Administered  . Influenza Split 12/08/2011  . Influenza Whole 11/25/2005, 11/24/2006  . Influenza,inj,Quad PF,36+ Mos 11/07/2012  . Pneumococcal Polysaccharide-23 12/22/2007  . Td 11/22/2003    Allergies  Allergen Reactions  . Caffeine     Medications: Patient's Medications  New Prescriptions   No medications on file  Previous Medications   AMLODIPINE (NORVASC) 5 MG TABLET    Take one tablet by mouth once daily to control blood pressure   ASPIRIN 81 MG TABLET    Take 81 mg by mouth every morning.    BENAZEPRIL (LOTENSIN) 5 MG TABLET    Take one tablet by  mouth once daily for blood pressure   CALCIUM CARBONATE-VITAMIN D (CALCIUM-CARB 600 + D) 600-125 MG-UNIT TABS    Take 2 tablets by mouth every morning.    GABAPENTIN (NEURONTIN) 100 MG CAPSULE    Take three tablet once daily with supper for restless leg   GLIPIZIDE (GLUCOTROL XL) 5 MG 24 HR TABLET    Take one tablet by mouth once daily to control blood sugar   METFORMIN (GLUCOPHAGE) 1000 MG TABLET    Take one tablet by mouth twice daily to control blood sugar   SIMVASTATIN (ZOCOR) 40 MG TABLET    Take one tablet by mouth once daily for cholesterol   SYRINGE/NEEDLE, DISP, (SYRINGE 3CC/25GX1") 25G X 1" 3 ML MISC    Use as directed with Cyanocobalmin   TAMSULOSIN (FLOMAX) 0.4 MG CAPS CAPSULE    Take one tablet by mouth once daily at bedtime to prevent urinary frequency   TDAP (BOOSTRIX) 5-2.5-18.5 LF-MCG/0.5 INJECTION    Inject 0.5 mLs into the muscle once.  Modified Medications   Modified Medication Previous Medication   CYANOCOBALAMIN (,VITAMIN B-12,) 1000 MCG/ML INJECTION cyanocobalamin (,VITAMIN B-12,) 1000 MCG/ML injection      1 mL into muscle every 30 days. DX: b12 deficiency    Inject 1 mL (1,000 mcg total) into the muscle every 30 (thirty) days. For vitamin B12  Discontinued Medications   CIPROFLOXACIN (CIPRO) 250 MG TABLET    Take 1 tablet (250 mg total) by mouth 2 (two) times daily.     Review of Systems  Constitutional: Positive for appetite change (Loss of appetite) and fatigue.       Losing weight  HENT: Positive for hearing loss (Wearing hearing aids) and voice change (Voice is weaker.). Negative for ear pain.        Edentulous  Eyes: Negative.   Respiratory: Positive for cough (Related to recurrent aspiration) and shortness of breath.   Cardiovascular: Negative for chest pain, palpitations and leg swelling.  Gastrointestinal: Positive for constipation.       History of diverticulitis. Mild dysphagia.  Endocrine:       Diabetic.  Genitourinary:       Weak stream.  Occasional urinary incontinence.  Musculoskeletal: Positive for arthralgias, back pain, gait problem, myalgias, neck pain and neck stiffness.  Skin: Negative for pallor, rash and wound.       Area on scrotum that is weeping and raw.  Allergic/Immunologic: Negative.   Neurological: Positive for dizziness, tremors and weakness (Generalized).       Memory loss. Confusion.  Hematological:  Petechia  Psychiatric/Behavioral: Positive for sleep disturbance, dysphoric mood and agitation. The patient is nervous/anxious.     Filed Vitals:   10/17/13 1356  BP: 130/78  Pulse: 66  Temp: 97.9 F (36.6 C)  TempSrc: Oral  Resp: 10  Height: 5\' 10"  (1.778 m)  Weight: 182 lb (82.555 kg)  SpO2: 98%   Body mass index is 26.11 kg/(m^2).  Physical Exam  Constitutional: He is oriented to person, place, and time.  Thin. Fragile. Elderly.  HENT:  Head: Normocephalic and atraumatic.  Right Ear: External ear normal.  Left Ear: External ear normal.  Nose: Nose normal.  Mouth/Throat: Oropharynx is clear and moist.  Loss of hearing. Hearing aids. Edentulous.  Eyes: Conjunctivae and EOM are normal. Pupils are equal, round, and reactive to light.  Neck: No JVD present. No tracheal deviation present. No thyromegaly present.  Cardiovascular: Normal rate, regular rhythm, normal heart sounds and intact distal pulses.  Exam reveals no gallop and no friction rub.   No murmur heard. Pulmonary/Chest: Breath sounds normal. No respiratory distress. He has no wheezes. He has no rales.  Abdominal: He exhibits no distension and no mass. There is no tenderness.  Musculoskeletal: He exhibits no edema and no tenderness.  Tender in both shoulders. Mild restriction of movement of the shoulders.  Lymphadenopathy:    He has no cervical adenopathy.  Neurological: He is alert and oriented to person, place, and time. He displays abnormal reflex. No cranial nerve deficit. He exhibits normal muscle tone. Coordination  normal.  Diminished DTR bilaterally and patella..  Skin: No rash noted. No erythema. No pallor.  Area on upper right scrotum that the skin is rw and weeping.  Psychiatric: He has a normal mood and affect. His behavior is normal. Judgment and thought content normal.     Labs reviewed: Lab on 10/13/2013  Component Date Value Ref Range Status  . Hemoglobin A1C 10/13/2013 5.9* 4.8 - 5.6 % Final   Comment:          Increased risk for diabetes: 5.7 - 6.4                                   Diabetes: >6.4                                   Glycemic control for adults with diabetes: <7.0  . Estimated average glucose 10/13/2013 123   Final  . Glucose 10/13/2013 124* 65 - 99 mg/dL Final  . BUN 16/11/9602 19  8 - 27 mg/dL Final  . Creatinine, Ser 10/13/2013 1.05  0.76 - 1.27 mg/dL Final  . GFR calc non Af Amer 10/13/2013 67  >59 mL/min/1.73 Final  . GFR calc Af Amer 10/13/2013 77  >59 mL/min/1.73 Final  . BUN/Creatinine Ratio 10/13/2013 18  10 - 22 Final  . Sodium 10/13/2013 143  134 - 144 mmol/L Final  . Potassium 10/13/2013 4.0  3.5 - 5.2 mmol/L Final  . Chloride 10/13/2013 100  97 - 108 mmol/L Final  . CO2 10/13/2013 24  18 - 29 mmol/L Final  . Calcium 10/13/2013 10.1  8.6 - 10.2 mg/dL Final  . Cholesterol, Total 10/13/2013 132  100 - 199 mg/dL Final  . Triglycerides 10/13/2013 136  0 - 149 mg/dL Final  . HDL 54/10/8117 49  >39 mg/dL Final   Comment: According  to ATP-III Guidelines, HDL-C >59 mg/dL is considered a                          negative risk factor for CHD.  Marland Kitchen VLDL Cholesterol Cal 10/13/2013 27  5 - 40 mg/dL Final  . LDL Calculated 10/13/2013 56  0 - 99 mg/dL Final  . Chol/HDL Ratio 10/13/2013 2.7  0.0 - 5.0 ratio units Final   Comment:                                   T. Chol/HDL Ratio                                                                      Men  Women                                                        1/2 Avg.Risk  3.4    3.3                                                             Avg.Risk  5.0    4.4                                                         2X Avg.Risk  9.6    7.1                                                         3X Avg.Risk 23.4   11.0  . Color, UA 10/13/2013 orange   Final  . Clarity, UA 10/13/2013 cloudy   Final  . Glucose, UA 10/13/2013 neg   Final  . Bilirubin, UA 10/13/2013 trace   Final  . Ketones, UA 10/13/2013 small   Final  . Spec Grav, UA 10/13/2013 1.015   Final  . Blood, UA 10/13/2013 trace   Final  . pH, UA 10/13/2013 5.0   Final  . Protein, UA 10/13/2013 trace   Final  . Urobilinogen, UA 10/13/2013 0.2   Final  . Nitrite, UA 10/13/2013 neg   Final  . Leukocytes, UA 10/13/2013 Trace   Final  . Urine Culture, Routine 10/13/2013 Final report   Final  . Result 1 10/13/2013 Comment   Final   Comment: Mixed urogenital flora  10,000-25,000 colony forming units per mL  Appointment on 08/21/2013  Component Date Value Ref Range Status  . Urine Culture, Routine 08/21/2013 Final report*  Final  . Result 1 08/21/2013 Comment*  Final   Comment: Escherichia coli, identified by an automated biochemical system.                          Greater than 100,000 colony forming units per mL  . ANTIMICROBIAL SUSCEPTIBILITY 08/21/2013 Comment   Final   Comment:       ** S = Susceptible; I = Intermediate; R = Resistant **                                             P = Positive; N = Negative                                      MICS are expressed in micrograms per mL                             Antibiotic                 RSLT#1    RSLT#2    RSLT#3    RSLT#4                          Amoxicillin/Clavulanic Acid    S                          Ampicillin                     S                          Cefepime                       S                          Ceftriaxone                    S                          Cefuroxime                     S                          Cephalothin                    S                           Ciprofloxacin                  S                          Ertapenem  S                          Gentamicin                     S                          Imipenem                       S                          Levofloxacin                   S                          Nitrofurantoin                 S                          Piperacillin                   S                          Tetracycline                   S                          Tobramycin                     S                          Trimethoprim/Sulfa             S  . Specific Gravity, UA 08/21/2013 1.023  1.005 - 1.030 Final  . pH, UA 08/21/2013 6.5  5.0 - 7.5 Final  . Color, UA 08/21/2013 Yellow  Yellow Final  . Appearance Ur 08/21/2013 Cloudy* Clear Final  . Leukocytes, UA 08/21/2013 1+* Negative Final  . Protein, UA 08/21/2013 Negative  Negative/Trace Final  . Glucose, UA 08/21/2013 Negative  Negative Final  . Ketones, UA 08/21/2013 Negative  Negative Final  . RBC, UA 08/21/2013 Negative  Negative Final  . Bilirubin, UA 08/21/2013 Negative  Negative Final  . Urobilinogen, Ur 08/21/2013 1.0  0.0 - 1.9 mg/dL Final  . Nitrite, UA 09/81/1914 Negative  Negative Final     Assessment/Plan 1. HYPERTENSION Controlled - Comprehensive metabolic panel; Future  2. HYPERLIPIDEMIA Controlled - Lipid panel; Future  3. HYPOTHYROIDISM Normal TSH March 2015  4. Parkinson's plus syndrome Followed by Dr. Marjory Lies. Stable.   5. Restless legs syndrome (RLS) Improved  6. Type 2 diabetes mellitus with autonomic neuropathy Stable  7. Neck pain Chronic and intermittent  8. Insomnia Stable  9. Dizzy Unchanged  10. Cyanocobalamin deficiency - cyanocobalamin (,VITAMIN B-12,) 1000 MCG/ML injection; 1 mL into muscle every 30 days. DX: b12 deficiency  Dispense: 10 mL; Refill: 3 - CBC With differential/Platelet; Future  11. Cellulitis, scrotum Improved - mupirocin ointment  (BACTROBAN) 2 %; Apply nightly to lesion on scrotum  Dispense: 22 g;  Refill: 4

## 2013-10-18 LAB — MICROALBUMIN / CREATININE URINE RATIO: CREATININE UR: 69.5 mg/dL (ref 22.0–328.0)

## 2013-11-02 ENCOUNTER — Other Ambulatory Visit: Payer: Self-pay | Admitting: *Deleted

## 2013-11-02 DIAGNOSIS — E538 Deficiency of other specified B group vitamins: Secondary | ICD-10-CM

## 2013-11-02 MED ORDER — CYANOCOBALAMIN 1000 MCG/ML IJ SOLN: INTRAMUSCULAR | Status: DC

## 2013-11-02 NOTE — Telephone Encounter (Signed)
Patient wife Requested 

## 2013-11-06 ENCOUNTER — Telehealth: Payer: Self-pay | Admitting: Diagnostic Neuroimaging

## 2013-11-06 NOTE — Telephone Encounter (Signed)
Patient's wife calling to state that when patient got up this morning he fell and he later said it was due to his left leg feeling numb. Patient's wife is unsure of what to do, please call and advise.

## 2013-11-06 NOTE — Telephone Encounter (Signed)
I called and spoke to wife.  Pt has had increasing worsening sx of numbness, (more so this 1200 when got up).  Not acute, but progressive.  Wife did not think stroke, she was familiar with sx (due to father).  Felt like needed to be seen earlier then October.  Made appt for this 11-08-13 at 1100, (be here 1045).

## 2013-11-08 ENCOUNTER — Ambulatory Visit (INDEPENDENT_AMBULATORY_CARE_PROVIDER_SITE_OTHER): Payer: Medicare Other | Admitting: Diagnostic Neuroimaging

## 2013-11-08 ENCOUNTER — Encounter: Payer: Self-pay | Admitting: Diagnostic Neuroimaging

## 2013-11-08 VITALS — BP 124/71 | HR 79 | Temp 96.8°F | Ht 70.0 in | Wt 180.8 lb

## 2013-11-08 DIAGNOSIS — R269 Unspecified abnormalities of gait and mobility: Secondary | ICD-10-CM

## 2013-11-08 DIAGNOSIS — G238 Other specified degenerative diseases of basal ganglia: Secondary | ICD-10-CM

## 2013-11-08 DIAGNOSIS — G232 Striatonigral degeneration: Secondary | ICD-10-CM

## 2013-11-08 NOTE — Progress Notes (Signed)
[facility]  PATIENT: Nicholas Barnett DOB: Aug 13, 1931  REFERRING CLINICIAN:  HISTORY FROM: patient and wife and grandson REASON FOR VISIT: follow up   HISTORICAL  CHIEF COMPLAINT:  Chief Complaint  Patient presents with  . Follow-up    PD    HISTORY OF PRESENT ILLNESS:   UPDATE 11/08/13: Since last visit, had improved slightly, but then had sudden change this past Sunday night. Monday AM had more diff with left leg motor function. No numbness or pain.   UPDATE 11/24/12: Since last visit, symptoms have progressed. Walking and leg function much worse. Larey Seat in August 2014, but did not need hospitalization. C/o bursitis pain in both shoulders. Uses a single point cane. Is losing weight. Diff with swallowing getting worse.  PRIOR HPI (02/12/12, Dr. Sandria Manly): 80 year old right-handed white married male seen 06/13/2009 and 08/01/2009 with symptoms beginning 01/2009 of dizziness, weakness, difficulty getting up, falling, and dragging his legs. This began at a time he developed a rash over his hips, shoulders, neck, and the top of his head. Lyme titer 08/02/2009 was negative. He has dizziness on standing. He has hoarseness. He has  a chronic cough. He states his walking is the same. He uses a cane. He stumbles and  drags his feet. He has no falls. He has rare bowel or bladder incontinence. He occasionally has nausea. He has bladder frequency. He has pain in both shoulders, right knee, and the right foot is swollen. Physical therapy was discontinued because he has pain with exercise. He is independent in his activities of daily living. MRI of the brain 03/19/11shows pronounced general atrophy,chronic small vessel changes, and calcification or iron deposits in the basal ganglia, including the red nuclei, cerebral peduncles, and dentate nuclei. Workup with cervical MRI 5/4/2011showed multilevel biforaminal stenosis and no central spinal stenosis at any visualized level from the skull base  down to T3-4.Anti-gad-65, RPR, CPK and B12 revealed a low B12 of 181, methylmalonic acid of 494. He is on B12 shots.He tapered off of carbidopa/levodopa because of lack of benefit .There is a positive family history of dementia in a brother who died at 60. He had a swallowing study 08/13/2009 showing moderate dysphagia with intermittent aspiration of thin liquids. This showed progressive swallowing dysfunction. It was recommended that he consume several small meals during the day. He has not had any recurrent swallowing difficulties, but has coughing episodes. Patient has a history of lower back pain beginning at age 3 when a cow struck him against a tree. He has annually had lower back pain that does not radiate into his legs. He awakens every morning with back pain which lasts about 10 min.His wife thinks that he is walking better and has more energy since the B12 shots. He mows his grass,,but has difficulty dressing himself and is using depends. He can bathe himself, take care of his toilet,needs, and  feed himself He has difficulty with buttons. He complains of feeling lightheaded without spinning or nausea and vomiting. It occurs in all positions 24 hours per day, 7 days per week.There is nothing which makes it worse or better. He uses a single-point cane to walk. His falls assessment tool score is 12. He is independent in activities of daily living but does not drive a car,shop, cook, clean,or handle his financial affairs. He gives himself his medication. He complains of difficulty sleeping at night. He has numbness in his feet and hands.He denies foot pain. He does not exercise. He snores at  night He has symptoms of restless leg and his wife describes periodic leg movements.  He has right foot swelling. He has bursitis in his right and left shoulder. He has right knee pain.he denies memory loss, hallucinations, or delirium.He has bilateral hearing aids.It takes him 2 hours to go to sleep. He discontinued  ropinirole as a trial for restless leg syndrome and took 0.5 milligrams once at night.  REVIEW OF SYSTEMS: Full 14 system review of systems performed and notable only for decr activity weight fatigue constipation leg swelling easy bruising joint pain back pain aching muscles walkign diff.   ALLERGIES: Allergies  Allergen Reactions  . Caffeine     HOME MEDICATIONS: Outpatient Prescriptions Prior to Visit  Medication Sig Dispense Refill  . amLODipine (NORVASC) 5 MG tablet Take one tablet by mouth once daily to control blood pressure  90 tablet  3  . aspirin 81 MG tablet Take 81 mg by mouth every morning.       . benazepril (LOTENSIN) 5 MG tablet Take one tablet by mouth once daily for blood pressure  90 tablet  3  . Calcium Carbonate-Vitamin D (CALCIUM-CARB 600 + D) 600-125 MG-UNIT TABS Take 2 tablets by mouth every morning.       . cyanocobalamin (,VITAMIN B-12,) 1000 MCG/ML injection 1 mL into muscle every 30 days. DX: b12 deficiency  10 mL  3  . gabapentin (NEURONTIN) 100 MG capsule Take three tablet once daily with supper for restless leg  270 capsule  3  . glipiZIDE (GLUCOTROL XL) 5 MG 24 hr tablet Take one tablet by mouth once daily to control blood sugar  90 tablet  3  . metFORMIN (GLUCOPHAGE) 1000 MG tablet Take one tablet by mouth twice daily to control blood sugar  180 tablet  3  . mupirocin ointment (BACTROBAN) 2 % Apply nightly to lesion on scrotum  22 g  4  . simvastatin (ZOCOR) 40 MG tablet Take one tablet by mouth once daily for cholesterol  90 tablet  3  . Syringe/Needle, Disp, (SYRINGE 3CC/25GX1") 25G X 1" 3 ML MISC Use as directed with Cyanocobalmin  50 each  3  . tamsulosin (FLOMAX) 0.4 MG CAPS capsule Take one tablet by mouth once daily at bedtime to prevent urinary frequency  90 capsule  3  . Tdap (BOOSTRIX) 5-2.5-18.5 LF-MCG/0.5 injection Inject 0.5 mLs into the muscle once.  0.5 mL  0   No facility-administered medications prior to visit.    PAST MEDICAL  HISTORY: Past Medical History  Diagnosis Date  . Type 2 diabetes mellitus with autonomic neuropathy   . Hyperlipidemia   . Hypertension   . Arthritis   . Diverticulitis   . Parkinsonism   . Right knee pain   . Memory loss   . Depression   . Gait disturbance   . Restless leg   . Thyroid disease   . Vitamin D deficiency   . Osteoarthritis   . Osteoporosis   . Dysphagia, idiopathic     History of aspiration  . Recurrent falls     Using a cane and walker  . Dizzy   . Hearing loss of both ears   . Neck pain   . Thoracic back pain   . Lumbar back pain   . Shoulder pain   . Cyanocobalamin deficiency   . Other and unspecified hyperlipidemia   . Unspecified essential hypertension   . Abnormality of gait   . Unspecified hypothyroidism   . Restless  legs syndrome (RLS)     PAST SURGICAL HISTORY: Past Surgical History  Procedure Laterality Date  . Cataract extraction Bilateral 2012    Dr. Hazle Quant    FAMILY HISTORY: Family History  Problem Relation Age of Onset  . Heart disease Mother   . Diabetes Mother   . Stroke Mother   . Cancer Father     prostate  . Diabetes Sister   . Heart disease Brother     SOCIAL HISTORY:  History   Social History  . Marital Status: Married    Spouse Name: Kathie Rhodes    Number of Children: 3  . Years of Education: College   Occupational History  . Retired     Engineer, petroleum [employer]   Social History Main Topics  . Smoking status: Former Games developer  . Smokeless tobacco: Never Used     Comment: quit 30 years ago   . Alcohol Use: No  . Drug Use: No  . Sexual Activity: No   Other Topics Concern  . Not on file   Social History Narrative   Married 1956. Returned from a Production designer, theatre/television/film for [employer].   Patient lives at home with his family.   Caffeine Use: none   Patient has a living will and healthcare Barnett of attorney.     PHYSICAL EXAM  Filed Vitals:   11/08/13 1106  BP: 124/71  Pulse: 79  Temp: 96.8 F (36 C)  TempSrc: Oral  Height:   (1.778 m)  Weight: 180 lb 12.8 oz (82.01 kg)    Not recorded    Body mass index is 25.94 kg/(m^2).  GENERAL EXAM: Patient is in no distress; MASKED FACIES. POSITIVE SNOUT. NEG MYERSONS.  CARDIOVASCULAR: Regular rate and rhythm, no murmurs, no carotid bruits  NEUROLOGIC: MENTAL STATUS: awake, alert, language fluent, comprehension intact, naming intact CRANIAL NERVE: pupils equal and reactive to light, visual fields full to confrontation, extraocular muscles intact, SACCADIC BREAKDOWN OF SMOOTH PURSUIT. no nystagmus, facial sensation and strength symmetric, uvula midline, shoulder shrug symmetric, tongue midline. MOD DYSARTHRIA, HOARSE, QUIET VOICE. MOTOR: RARE REST TREMOR IN RIGHT THUMB. INCR TONE IN BUE AND BLE. BUE BRADYKINETIC (PROX 3 LIMITED BY PAIN. DISTAL 5). RLE 2-3 WITH SIGNIFICANT BRADYKINESIA. LLE 4 WITH MILD BRADY. SENSORY: normal and symmetric to light touch COORDINATION: finger-nose-finger, fine finger movements SLOW GAIT/STATION: STANDS WITH 1 PERSON ASSIST; USES WALKING, WITH SIGNIFICANT GAIT APRAXIA AND FREEZING ON TURNS.  DIAGNOSTIC DATA (LABS, IMAGING, TESTING) - I reviewed patient records, labs, notes, testing and imaging myself where available.  Lab Results  Component Value Date   WBC 10.6* 11/04/2012   HGB 14.6 11/04/2012   HCT 42.4 11/04/2012   MCV 93.0 11/04/2012   PLT 204 11/04/2012      Component Value Date/Time   NA 143 10/13/2013 0928   NA 134* 11/04/2012 1425   K 4.0 10/13/2013 0928   CL 100 10/13/2013 0928   CO2 24 10/13/2013 0928   GLUCOSE 124* 10/13/2013 0928   GLUCOSE 99 11/04/2012 1425   BUN 19 10/13/2013 0928   BUN 16 11/04/2012 1425   CREATININE 1.05 10/13/2013 0928   CALCIUM 10.1 10/13/2013 0928   PROT 6.6 04/10/2013 0945   PROT 7.1 11/04/2012 1425   ALBUMIN 3.5 11/04/2012 1425   AST 16 04/10/2013 0945   ALT 9 04/10/2013 0945   ALKPHOS 75 04/10/2013 0945   BILITOT 0.7 04/10/2013 0945   GFRNONAA 67 10/13/2013 0928   GFRAA 77 10/13/2013 0928    Lab Results  Component Value Date   CHOL 130 05/13/2012   HDL 49 10/13/2013   LDLCALC 56 10/13/2013   LDLDIRECT 68.6 05/13/2012   TRIG 136 10/13/2013   CHOLHDL 2.7 10/13/2013   Lab Results  Component Value Date   HGBA1C 5.9* 10/13/2013   Lab Results  Component Value Date   VITAMINB12 1142* 10/15/2010   Lab Results  Component Value Date   TSH 0.98 05/13/2012    04/18/09 MRI BRAIN - moderate atrophy; moderate chronic small vessel ischemic disease.  06/26/09 MRI CERVICAL SPINE - Multilevel biforaminal stenosis ranging from mild to severe, due to uncovertebral joint hypertrophy. No spinal stenosis at any visualize level from skull base down to T3-4.  07/31/10 MRI THORACIC SPINE - Degenerative changes at T8-9 and T9-10. No spinal stenosis or foraminal narrowing.  07/31/10 MRI LUMBAR SPINE - mild spondylitic and facet degenerative changes with mild canal and biforaminal stenosis at L3-4 and L4-5   ASSESSMENT AND PLAN  80 y.o. year old male here with advanced parkinson's plus syndrome, diabetic neuropathy and lumbar radiculopathy. No benefit with carb/levo or ropinirole. Sudden worsening of gait and left leg function on 11/05/13; will check MRI to rule out stroke. Will check labs as well. Otherwise, this may represent disease progression.  PLAN:  Orders Placed This Encounter  Procedures  . MR Brain Wo Contrast  . CBC With differential/Platelet  . Comprehensive metabolic panel  . UA/M w/rflx Culture, Routine   Return Oct 2015.    Suanne Marker, MD 11/08/2013, 12:20 PM Certified in Neurology, Neurophysiology and Neuroimaging  [facility] [address], [address] [place], [place] [address] [phone]

## 2013-11-21 ENCOUNTER — Ambulatory Visit
Admission: RE | Admit: 2013-11-21 | Discharge: 2013-11-21 | Disposition: A | Discharge status: Home / Self Care | Payer: Medicare Other | Source: Ambulatory Visit | Attending: Diagnostic Neuroimaging | Admitting: Diagnostic Neuroimaging

## 2013-11-21 DIAGNOSIS — G238 Other specified degenerative diseases of basal ganglia: Secondary | ICD-10-CM

## 2013-11-21 DIAGNOSIS — R269 Unspecified abnormalities of gait and mobility: Secondary | ICD-10-CM

## 2013-11-21 DIAGNOSIS — G232 Striatonigral degeneration: Secondary | ICD-10-CM

## 2013-11-22 ENCOUNTER — Telehealth: Payer: Self-pay | Admitting: Diagnostic Neuroimaging

## 2013-11-22 NOTE — Telephone Encounter (Signed)
Patient's wife calling to speak with Nicholas Barnett, please return call and advise.

## 2013-11-22 NOTE — Telephone Encounter (Signed)
Spoke to spouse. Gave results. She requested I cancel upcoming appt. She said she would call us when she needed to, since there is no change in the MRI, she believes patient is just experiencing progression of disease.

## 2013-11-22 NOTE — Telephone Encounter (Signed)
Spoke to patient. Gave results. Advised to have spouse to call back when she returns from outside. Patient agreed.

## 2013-11-22 NOTE — Telephone Encounter (Signed)
Patient's spouse [name], requesting MRI results.  Please call anytime and may leave detailed message on voicemail.

## 2013-11-22 NOTE — Telephone Encounter (Signed)
No significant change from MRI on 04/18/09. No new stroke. Continue current plan. -VRP

## 2013-11-29 ENCOUNTER — Ambulatory Visit: Payer: BC Managed Care – PPO | Admitting: Diagnostic Neuroimaging

## 2013-12-19 ENCOUNTER — Other Ambulatory Visit: Payer: Self-pay | Admitting: *Deleted

## 2013-12-19 MED ORDER — SIMVASTATIN 40 MG PO TABS: ORAL_TABLET | ORAL | Status: DC

## 2013-12-19 MED ORDER — BENAZEPRIL HCL 5 MG PO TABS: ORAL_TABLET | ORAL | Status: DC

## 2013-12-19 MED ORDER — GLIPIZIDE ER 5 MG PO TB24: ORAL_TABLET | ORAL | Status: DC

## 2013-12-19 MED ORDER — METFORMIN HCL 1000 MG PO TABS: ORAL_TABLET | ORAL | Status: DC

## 2013-12-19 NOTE — Telephone Encounter (Signed)
[facility] 

## 2014-01-23 ENCOUNTER — Other Ambulatory Visit: Payer: Self-pay | Admitting: Internal Medicine

## 2014-01-24 ENCOUNTER — Other Ambulatory Visit: Payer: Self-pay | Admitting: *Deleted

## 2014-01-24 MED ORDER — TAMSULOSIN HCL 0.4 MG PO CAPS: ORAL_CAPSULE | ORAL | Status: DC

## 2014-01-24 MED ORDER — AMLODIPINE BESYLATE 5 MG PO TABS: ORAL_TABLET | ORAL | Status: DC

## 2014-01-24 NOTE — Telephone Encounter (Signed)
[facility] 

## 2014-01-31 ENCOUNTER — Encounter: Payer: Self-pay | Admitting: Neurology

## 2014-01-31 ENCOUNTER — Other Ambulatory Visit (HOSPITAL_COMMUNITY): Payer: Self-pay | Admitting: Neurology

## 2014-01-31 ENCOUNTER — Ambulatory Visit (INDEPENDENT_AMBULATORY_CARE_PROVIDER_SITE_OTHER): Payer: Medicare Other | Admitting: Neurology

## 2014-01-31 VITALS — BP 110/60 | HR 80 | Ht 71.0 in | Wt 178.0 lb

## 2014-01-31 DIAGNOSIS — R1310 Dysphagia, unspecified: Secondary | ICD-10-CM

## 2014-01-31 DIAGNOSIS — R1319 Other dysphagia: Secondary | ICD-10-CM

## 2014-01-31 DIAGNOSIS — G903 Multi-system degeneration of the autonomic nervous system: Secondary | ICD-10-CM

## 2014-01-31 DIAGNOSIS — G239 Degenerative disease of basal ganglia, unspecified: Secondary | ICD-10-CM

## 2014-01-31 DIAGNOSIS — K117 Disturbances of salivary secretion: Secondary | ICD-10-CM

## 2014-01-31 DIAGNOSIS — F458 Other somatoform disorders: Secondary | ICD-10-CM

## 2014-01-31 NOTE — Progress Notes (Signed)
Nicholas Barnett was seen today in the movement disorders clinic for neurologic consultation at the request of GREEN, Lenon Curt, MD.  The consultation is for the evaluation of Parkinsonism.  The patient has previously seen Dr. Sandria Manly and Dr. Marjory Lies.  Records were reviewed from both of those physicians.  The patient's symptoms started in 2010.  First symptoms consisted of dizziness and falls.  He began to see Dr. Sandria Manly around the time that symptoms began.  He first had an MRI of the brain in February 2011 that demonstrated rather significant atrophy.  B12 at that time was found to be low at 181.  He had a swallow study in June, 2011 that demonstrated moderate dysphagia with intermittent aspiration of thin liquids.  He was tried on levodopa (I do not know what dose and they don't even remember the medication or how long he was on it) and that did not seem to help.  He was later tried on ropinirole which also did not help.  He was tried on, and is still on, gabapentin for restless leg syndrome.  His most recent MRI of the brain was on 11/21/2013 and I reviewed this as well as his previous MRI of the brain.  There remains very significant atrophy, although there is not significant basal ganglia disease.  There is mild to moderate small vessel disease.   Specific Symptoms:  Tremor: Yes.   (pt states that it is when he is using the hands to eat or when he puts pressure on the hands) Voice: hyophonic (has never done LSVT loud) Sleep: trouble getting to sleep  Vivid Dreams:  No.  Acting out dreams:  No. Wet Pillows: No. Postural symptoms:  Yes.    Falls?  Yes.   (fell twice this week - one time getting out of chair and fell on knee; one time putting on pants and leg got caught; usually falls one time a week; no hx of fx) Bradykinesia symptoms: difficulty with initiating movement, slow movements, difficulty getting out of a chair and difficulty regaining balance (last PT at home one year ago) Loss of smell:   No. Loss of taste:  No. Urinary Incontinence:  Yes.   (lost control 2 years ago; wears ago) Difficulty Swallowing:  Yes.   (last MBE was 2011) Handwriting, micrographia: Yes.   Trouble with ADL's:  Yes.    Trouble buttoning clothing: Yes.   Depression:  Yes.   (more frustrated over the dx) Memory changes:  Yes.   (memory "fair" per family; quit driving 5 years ago; wife prepares pill box and pt takes them on own) Hallucinations:  No.  visual distortions: No. N/V:  No. Lightheaded:  Yes.   but not a big deal  Syncope: No. Diplopia:  No. Dyskinesia:  No.  Neuroimaging has previously been performed.  It is available for my review today.  I reviewed pts MRI brain from 11/21/13.  There was no evidence of BG disease but there was very significant atrophy.    PREVIOUS MEDICATIONS: Sinemet and Requip  ALLERGIES:   Allergies  Allergen Reactions  . Caffeine     CURRENT MEDICATIONS:  Outpatient Encounter Prescriptions as of 01/31/2014  Medication Sig  . amLODipine (NORVASC) 5 MG tablet Take one tablet by mouth once daily to control blood pressure (Patient taking differently: 10 mg. Take one tablet by mouth once daily to control blood pressure)  . aspirin 81 MG tablet Take 81 mg by mouth every morning.   Marland Kitchen  benazepril (LOTENSIN) 5 MG tablet Take one tablet by mouth once daily for blood pressure (Patient taking differently: 20 mg. Take one tablet by mouth once daily for blood pressure)  . Calcium Carbonate-Vitamin D (CALCIUM-CARB 600 + D) 600-125 MG-UNIT TABS Take 2 tablets by mouth every morning.   . cyanocobalamin (,VITAMIN B-12,) 1000 MCG/ML injection 1 mL into muscle every 30 days. DX: b12 deficiency  . gabapentin (NEURONTIN) 100 MG capsule TAKE 3 BY MOUTH ONCE DAILY WITH SUPPER FOR RESTLESS LEG  . glipiZIDE (GLUCOTROL XL) 5 MG 24 hr tablet Take one tablet by mouth once daily to control blood sugar (Patient taking differently: 10 mg. Take one tablet by mouth once daily to control blood sugar)   . metFORMIN (GLUCOPHAGE) 1000 MG tablet Take one tablet by mouth twice daily to control blood sugar  . mupirocin ointment (BACTROBAN) 2 % Apply nightly to lesion on scrotum  . simvastatin (ZOCOR) 40 MG tablet Take one tablet by mouth once daily for cholesterol  . tamsulosin (FLOMAX) 0.4 MG CAPS capsule Take one tablet by mouth once daily at bedtime to prevent urinary frequency  . Tdap (BOOSTRIX) 5-2.5-18.5 LF-MCG/0.5 injection Inject 0.5 mLs into the muscle once.  . Syringe/Needle, Disp, (SYRINGE 3CC/25GX1") 25G X 1" 3 ML MISC Use as directed with Cyanocobalmin (Patient not taking: Reported on 01/31/2014)    PAST MEDICAL HISTORY:   Past Medical History  Diagnosis Date  . Type 2 diabetes mellitus with autonomic neuropathy   . Hyperlipidemia   . Hypertension   . Arthritis   . Diverticulitis   . Parkinsonism   . Right knee pain   . Memory loss   . Depression   . Gait disturbance   . Restless leg   . Thyroid disease   . Vitamin D deficiency   . Osteoarthritis   . Osteoporosis   . Dysphagia, idiopathic     History of aspiration  . Recurrent falls     Using a cane and walker  . Dizzy   . Hearing loss of both ears   . Neck pain   . Thoracic back pain   . Lumbar back pain   . Shoulder pain   . Cyanocobalamin deficiency   . Other and unspecified hyperlipidemia   . Unspecified essential hypertension   . Abnormality of gait   . Unspecified hypothyroidism   . Restless legs syndrome (RLS)     PAST SURGICAL HISTORY:   Past Surgical History  Procedure Laterality Date  . Cataract extraction Bilateral 2012    Dr. Hazle Quantigby    SOCIAL HISTORY:   History   Social History  . Marital Status: Married    Spouse Name: [name]    Number of Children: 3  . Years of Education: College   Occupational History  . Retired     Engineer, petroleumManager/ [employer]   Social History Main Topics  . Smoking status: Former Smoker    Quit date: 01/31/1974  . Smokeless tobacco: Never Used  . Alcohol Use: No    . Drug Use: No  . Sexual Activity: No   Other Topics Concern  . Not on file   Social History Narrative   Married 1956. 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   Patient lives at home with his family.   Caffeine Use: none   Patient has a living will and healthcare power of attorney.    FAMILY HISTORY:   Family Status  Relation Status Death Age  . Mother Deceased 6374  stroke, MI  . Father Deceased 4071    prostate cancer  . Sister Deceased 3679    diabetic, cirrhosis  . Brother Deceased 10 months    crib death  . Brother Deceased 3483    MI    ROS:  A complete 10 system review of systems was obtained and was unremarkable apart from what is mentioned above.  PHYSICAL EXAMINATION:    VITALS:   Filed Vitals:   01/31/14 0918  BP: 110/60  Pulse: 80  Height: 5\' 11"  (1.803 m)  Weight: 178 lb (80.74 kg)    GEN:  The patient appears stated age and is in NAD.  Some pathologic laughter. HEENT:  Normocephalic, atraumatic.  The mucous membranes are moist. The superficial temporal arteries are without ropiness or tenderness. CV:  RRR.  Some inspirational stridor Lungs:  CTAB Neck/HEME:  There are no carotid bruits bilaterally.  There is neck flexion.    Neurological examination:  Orientation: The patient is alert and oriented x3. Fund of knowledge is appropriate.  Recent and remote memory are intact.  Attention and concentration are normal.    Able to name objects and repeat phrases. Cranial nerves: There is good facial symmetry. There is facial hypomimia.  Pupils are equal round and reactive to light bilaterally. Fundoscopic exam reveals clear margins bilaterally. Extraocular muscles are intact. There are square wave jerks.  The visual fields are full to confrontational testing. The speech is fluent and clear. Soft palate rises symmetrically and there is no tongue deviation. Hearing is intact to conversational tone. Sensation: Sensation is intact to light and pinprick throughout (facial,  trunk, extremities). Vibration is decreased distally. There is no extinction with double simultaneous stimulation. There is no sensory dermatomal level identified. Motor: Strength is 5/5 in the bilateral upper extremities and left lower extremity.  Strength is only 3/5 in the proximal RLE and is 4/5 in the distal RLE (pt states chronic as does family).   Shoulder shrug is equal and symmetric.  There is no pronator drift. Deep tendon reflexes: Deep tendon reflexes are 2/4 at the bilateral biceps, triceps, brachioradialis, patella and trace achilles. Plantar responses are downgoing bilaterally.  Movement examination: Tone: There is increased tone in the RUE.  There is normal tone in the LUE and bilateral lower extremities.   Abnormal movements: none Coordination:  There is decremation with RAM's, seen with finger taps, hand opening and closing, heel taps, and toe taps, especially on the right.   Gait and Station: The patient has  difficulty arising out of a deep-seated chair without the use of the hands and requires assistance to get out of the chair. The patient's stride length is decreased and the R foot freezes especially with turns but othertimes as well.  He is very unstable.      ASSESSMENT/PLAN:  1.  Multiple system atrophy  -Don't think that this is idiopathic PD.  However, MSA patients can respond to levodopa, sometimes requiring higher dosages.  Talked to them about doing on/off test to see if levodopa helpful.  They would like to proceed  -Spent greater than 50% of the 60 minute visit in counseling.  We discussed safety.  -We will refer for PT and ST. 2.  Dysphagia.  -He had a modified barium swallow in June, 2011 demonstrated a moderate dysphagia with aspiration of thin liquids.  He has not had that repeated.  We will go ahead and repeat that. 3.  Sialorrhea.  -Talked about the value of Myobloc.  He will let me know if and when he would like to proceed. 4.  I will follow up with him at  the time of his on/off testing.

## 2014-01-31 NOTE — Patient Instructions (Addendum)
1. ON/OFF test is scheduled for 02/13/14 at 10:15 am.  2. We have [facility] your modified barium swallow on 02/05/14 at 11:00 am. Please arrive 15 minutes prior and go to 1st floor radiology. If you need to reschedule for any reason please call [phone]. 3. You have been referred to [facility] for physical and speech therapy. They will call you directly to schedule an appointment.  Please call [phone] if you do not hear from them.

## 2014-02-05 ENCOUNTER — Ambulatory Visit (HOSPITAL_COMMUNITY)
Admission: RE | Admit: 2014-02-05 | Discharge: 2014-02-05 | Disposition: A | Discharge status: Home / Self Care | Payer: Medicare Other | Source: Ambulatory Visit | Attending: Neurology | Admitting: Neurology

## 2014-02-05 DIAGNOSIS — R1313 Dysphagia, pharyngeal phase: Secondary | ICD-10-CM | POA: Diagnosis not present

## 2014-02-05 DIAGNOSIS — R05 Cough: Secondary | ICD-10-CM | POA: Diagnosis not present

## 2014-02-05 DIAGNOSIS — I10 Essential (primary) hypertension: Secondary | ICD-10-CM | POA: Insufficient documentation

## 2014-02-05 DIAGNOSIS — R1310 Dysphagia, unspecified: Secondary | ICD-10-CM | POA: Diagnosis not present

## 2014-02-05 DIAGNOSIS — R1311 Dysphagia, oral phase: Secondary | ICD-10-CM | POA: Diagnosis not present

## 2014-02-05 DIAGNOSIS — E1143 Type 2 diabetes mellitus with diabetic autonomic (poly)neuropathy: Secondary | ICD-10-CM | POA: Insufficient documentation

## 2014-02-05 DIAGNOSIS — E785 Hyperlipidemia, unspecified: Secondary | ICD-10-CM | POA: Diagnosis not present

## 2014-02-05 DIAGNOSIS — G20 Parkinson's disease: Secondary | ICD-10-CM | POA: Insufficient documentation

## 2014-02-05 DIAGNOSIS — R1319 Other dysphagia: Secondary | ICD-10-CM

## 2014-02-05 NOTE — Procedures (Signed)
Objective Swallowing Evaluation: Modified Barium Swallowing Study  Patient Details  Name: Nicholas HalterDavid L Barnett MRN: [id number] Date of Birth: [birth year]  Today's Date: 02/05/2014 Time: [id number] SLP Time Calculation (min) (ACUTE ONLY): 39 min  Past Medical History:  Past Medical History  Diagnosis Date  . Type 2 diabetes mellitus with autonomic neuropathy   . Hyperlipidemia   . Hypertension   . Arthritis   . Diverticulitis   . Parkinsonism   . Right knee pain   . Memory loss   . Depression   . Gait disturbance   . Restless leg   . Thyroid disease   . Vitamin D deficiency   . Osteoarthritis   . Osteoporosis   . Dysphagia, idiopathic     History of aspiration  . Recurrent falls     Using a cane and walker  . Dizzy   . Hearing loss of both ears   . Neck pain   . Thoracic back pain   . Lumbar back pain   . Shoulder pain   . Cyanocobalamin deficiency   . Other and unspecified hyperlipidemia   . Unspecified essential hypertension   . Abnormality of gait   . Unspecified hypothyroidism   . Restless legs syndrome (RLS)    Past Surgical History:  Past Surgical History  Procedure Laterality Date  . Cataract extraction Bilateral 2012    Dr. Hazle Quantigby   HPI:  80 yo male with PMH significant for parkinsonism, with pt reported decline in function over the last ~6 months. Per patient, this includes decrease in vocal intensity with increase in falls and difficulty swallowing. Pt had MBS most recently in 2011 with intermittent aspiration per recent MD note. At the time pt was recommended to consume nectar-thick liquids, however wife says that he did not comply as he did not care for the thickened drinks.      Assessment / Plan / Recommendation Clinical Impression  Dysphagia Diagnosis: Mild oral phase dysphagia;Moderate pharyngeal phase dysphagia  Clinical impression: Pt has a mild oral and moderate pharyngeal dysphagia that is sensorimotor based. Oral phase is marked by lingual pumping  and slow posterior transit, with piecemeal swallows observed with larger boluses. A delayed swallow initiation, decreased hyolaryngeal movement, and generalized weakness lead to penetration/aspiration during the swallow with all liquids tested, as well as moderate residuals at the valleculae, pyriform sinuses, and posterior pharyngeal wall. SLP intervention for cued coughs was not effective at expelling aspirates/penetrates, however cues for a chin tuck and second swallow when paired with small (tsp) boluses of honey thick liquids both increased airway protection and reduced residuals. Recommend Dys 3 textures and honey thick liquids with a chin tuck and second swallow. Recommend OP SLP f/u for dysphagia treatment.    Treatment Recommendation  Defer treatment plan to SLP at (Comment) (OP SLP)    Diet Recommendation Dysphagia 3 (Mechanical Soft);Honey-thick liquid   Liquid Administration via: Spoon;Cup;No straw (spoon versus very small cup sips) Medication Administration: Whole meds with puree Supervision: Patient able to self feed;Full supervision/cueing for compensatory strategies Compensations: Slow rate;Small sips/bites;Multiple dry swallows after each bite/sip Postural Changes and/or Swallow Maneuvers: Seated upright 90 degrees;Upright 30-60 min after meal;Chin tuck    Other  Recommendations Oral Care Recommendations: Oral care BID   Follow Up Recommendations  Outpatient SLP    Frequency and Duration        Pertinent Vitals/Pain n/a    SLP Swallow Goals     General Date of Onset:  (chronic element, worsened over last  several months) HPI: 80 yo male with PMH significant for parkinsonism, with pt reported decline in function over the last ~6 months. Per patient, this includes decrease in vocal intensity with increase in falls and difficulty swallowing. Pt had MBS most recently in 2011 with intermittent aspiration per recent MD note. At the time pt was recommended to consume nectar-thick  liquids, however wife says that he did not comply as he did not care for the thickened drinks.  Type of Study: Modified Barium Swallowing Study Reason for Referral: Objectively evaluate swallowing function Previous Swallow Assessment: see HPI Diet Prior to this Study: Regular;Thin liquids Respiratory Status: Room air History of Recent Intubation: No Behavior/Cognition: Alert;Cooperative;Pleasant mood;Requires cueing Oral Cavity - Dentition: Adequate natural dentition Oral Motor / Sensory Function: Impaired motor Oral impairment: Other (Comment) (lingual weakness, decreased ROM) Self-Feeding Abilities: Able to feed self Patient Positioning: Upright in chair Baseline Vocal Quality: Wet;Low vocal intensity Volitional Cough: Weak Volitional Swallow: Able to elicit Anatomy: Within functional limits Pharyngeal Secretions: Not observed secondary MBS    Reason for Referral Objectively evaluate swallowing function   Oral Phase Oral Preparation/Oral Phase Oral Phase: Impaired Oral - Honey Oral - Honey Teaspoon: Lingual pumping;Reduced posterior propulsion;Delayed oral transit Oral - Nectar Oral - Nectar Teaspoon: Lingual pumping;Reduced posterior propulsion;Delayed oral transit Oral - Nectar Cup: Lingual pumping;Reduced posterior propulsion;Delayed oral transit;Piecemeal swallowing Oral - Thin Oral - Thin Teaspoon: Lingual pumping;Reduced posterior propulsion;Delayed oral transit Oral - Thin Cup: Lingual pumping;Reduced posterior propulsion;Delayed oral transit;Piecemeal swallowing Oral - Solids Oral - Puree: Lingual pumping;Reduced posterior propulsion;Delayed oral transit;Piecemeal swallowing Oral - Mechanical Soft: Lingual pumping;Reduced posterior propulsion;Delayed oral transit   Pharyngeal Phase Pharyngeal Phase Pharyngeal Phase: Impaired Pharyngeal - Honey Pharyngeal - Honey Teaspoon: Delayed swallow initiation;Reduced pharyngeal peristalsis;Reduced anterior laryngeal  mobility;Reduced laryngeal elevation;Reduced tongue base retraction;Penetration/Aspiration during swallow;Reduced airway/laryngeal closure;Pharyngeal residue - valleculae;Pharyngeal residue - posterior pharnyx;Compensatory strategies attempted (Comment) (chin tuck effective at increasing airway protection) Penetration/Aspiration details (honey teaspoon): Material enters airway, remains ABOVE vocal cords and not ejected out Pharyngeal - Nectar Pharyngeal - Nectar Teaspoon: Delayed swallow initiation;Reduced pharyngeal peristalsis;Reduced anterior laryngeal mobility;Reduced laryngeal elevation;Reduced tongue base retraction;Penetration/Aspiration during swallow;Reduced airway/laryngeal closure;Pharyngeal residue - valleculae;Pharyngeal residue - posterior pharnyx;Compensatory strategies attempted (Comment) Penetration/Aspiration details (nectar teaspoon): Material enters airway, remains ABOVE vocal cords and not ejected out Pharyngeal - Nectar Cup: Delayed swallow initiation;Reduced pharyngeal peristalsis;Reduced anterior laryngeal mobility;Reduced laryngeal elevation;Reduced tongue base retraction;Penetration/Aspiration during swallow;Reduced airway/laryngeal closure;Pharyngeal residue - valleculae;Pharyngeal residue - posterior pharnyx;Compensatory strategies attempted (Comment) (chin tuck not effective) Penetration/Aspiration details (nectar cup): Material enters airway, passes BELOW cords without attempt by patient to eject out (silent aspiration) Pharyngeal - Thin Pharyngeal - Thin Teaspoon: Delayed swallow initiation;Reduced pharyngeal peristalsis;Reduced anterior laryngeal mobility;Reduced laryngeal elevation;Reduced tongue base retraction;Penetration/Aspiration during swallow;Reduced airway/laryngeal closure;Pharyngeal residue - valleculae;Pharyngeal residue - posterior pharnyx;Compensatory strategies attempted (Comment) Penetration/Aspiration details (thin teaspoon): Material enters airway, passes  BELOW cords without attempt by patient to eject out (silent aspiration) Pharyngeal - Thin Cup: Delayed swallow initiation;Reduced pharyngeal peristalsis;Reduced anterior laryngeal mobility;Reduced laryngeal elevation;Reduced tongue base retraction;Penetration/Aspiration during swallow;Reduced airway/laryngeal closure;Pharyngeal residue - valleculae;Pharyngeal residue - posterior pharnyx;Compensatory strategies attempted (Comment) (chin tuck not effective) Penetration/Aspiration details (thin cup): Material enters airway, passes BELOW cords without attempt by patient to eject out (silent aspiration) Pharyngeal - Solids Pharyngeal - Puree: Delayed swallow initiation;Reduced pharyngeal peristalsis;Reduced anterior laryngeal mobility;Reduced laryngeal elevation;Reduced tongue base retraction;Reduced airway/laryngeal closure;Pharyngeal residue - valleculae;Pharyngeal residue - posterior pharnyx;Compensatory strategies attempted (Comment);Penetration/Aspiration during swallow (chin tuck effective to decrease residue) Penetration/Aspiration details (puree): Material  enters airway, remains ABOVE vocal cords then ejected out Pharyngeal - Mechanical Soft: Delayed swallow initiation;Reduced pharyngeal peristalsis;Reduced anterior laryngeal mobility;Reduced laryngeal elevation;Reduced tongue base retraction;Reduced airway/laryngeal closure;Pharyngeal residue - valleculae;Pharyngeal residue - posterior pharnyx  Cervical Esophageal Phase    GO    Cervical Esophageal Phase Cervical Esophageal Phase: [facility]    Functional Assessment Tool Used: skilled clinical judgment Functional Limitations: Swallowing Swallow Current Status ([id number]): At least 60 percent but less than 80 percent impaired, limited or restricted Swallow Goal Status [phone]): At least 60 percent but less than 80 percent impaired, limited or restricted Swallow Discharge Status [phone]): At least 60 percent but less than 80 percent impaired, limited or  restricted     Maxcine Ham, M.A. CCC-SLP [phone]  Maxcine Ham 02/05/2014, 12:11 PM

## 2014-02-13 ENCOUNTER — Ambulatory Visit (INDEPENDENT_AMBULATORY_CARE_PROVIDER_SITE_OTHER): Payer: Medicare Other | Admitting: Neurology

## 2014-02-13 ENCOUNTER — Encounter: Payer: Self-pay | Admitting: Neurology

## 2014-02-13 VITALS — BP 120/70 | HR 80 | Ht 71.0 in | Wt 179.0 lb

## 2014-02-13 DIAGNOSIS — G903 Multi-system degeneration of the autonomic nervous system: Secondary | ICD-10-CM

## 2014-02-13 DIAGNOSIS — R1314 Dysphagia, pharyngoesophageal phase: Secondary | ICD-10-CM

## 2014-02-13 DIAGNOSIS — G239 Degenerative disease of basal ganglia, unspecified: Secondary | ICD-10-CM

## 2014-02-13 DIAGNOSIS — K117 Disturbances of salivary secretion: Secondary | ICD-10-CM

## 2014-02-13 MED ORDER — CARBIDOPA-LEVODOPA 25-100 MG PO TABS
2.5000 | ORAL_TABLET | Freq: Once | ORAL | Status: AC
  Administered 2014-02-13: 2.5 via ORAL

## 2014-02-13 MED ORDER — CARBIDOPA-LEVODOPA 25-100 MG PO TABS: 1.0000 | ORAL_TABLET | Freq: Three times a day (TID) | ORAL | Status: DC

## 2014-02-13 NOTE — Progress Notes (Signed)
Nicholas Barnett was seen today in the movement disorders clinic for neurologic consultation at the request of Nicholas Barnett, Nicholas CurtARTHUR G, MD.  The consultation is for the evaluation of Parkinsonism.  The patient has previously seen Dr. Sandria Barnett and Dr. Marjory Barnett.  Records were reviewed from both of those physicians.  The patient's symptoms started in 2010.  First symptoms consisted of dizziness and falls.  He began to see Dr. Sandria Barnett around the time that symptoms began.  He first had an MRI of the brain in February 2011 that demonstrated rather significant atrophy.  B12 at that time was found to be low at 181.  He had a swallow study in June, 2011 that demonstrated moderate dysphagia with intermittent aspiration of thin liquids.  He was tried on levodopa (I do not know what dose and they don't even remember the medication or how long he was on it) and that did not seem to help.  He was later tried on ropinirole which also did not help.  He was tried on, and is still on, gabapentin for restless leg syndrome.  His most recent MRI of the brain was on 11/21/2013 and I reviewed this as well as his previous MRI of the brain.  There remains very significant atrophy, although there is not significant basal ganglia disease.  There is mild to moderate small vessel disease.  02/13/14 update:  Pt was seen today for UPDRS motor on/off testing.  He is accompanied by his family who supplements the hx.  Pt had MBE on 02/05/14 and demonstrated mild oral and mod pharyngeal phase dysphagia and mechanical soft with honey thick liquid was recommended.  He is still awaiting ST to contact him (out pt).  Neuroimaging has previously been performed.  It is available for my review today.  I reviewed pts MRI brain from 11/21/13.  There was no evidence of BG disease but there was very significant atrophy.    PREVIOUS MEDICATIONS: Sinemet and Requip  ALLERGIES:   Allergies  Allergen Reactions  . Caffeine     CURRENT MEDICATIONS:  Outpatient  Encounter Prescriptions as of 02/13/2014  Medication Sig  . amLODipine (NORVASC) 5 MG tablet Take one tablet by mouth once daily to control blood pressure (Patient taking differently: 10 mg. Take one tablet by mouth once daily to control blood pressure)  . aspirin 81 MG tablet Take 81 mg by mouth every morning.   . benazepril (LOTENSIN) 5 MG tablet Take one tablet by mouth once daily for blood pressure (Patient taking differently: 20 mg. Take one tablet by mouth once daily for blood pressure)  . Calcium Carbonate-Vitamin D (CALCIUM-CARB 600 + D) 600-125 MG-UNIT TABS Take 2 tablets by mouth every morning.   . cyanocobalamin (,VITAMIN B-12,) 1000 MCG/ML injection 1 mL into muscle every 30 days. DX: b12 deficiency  . gabapentin (NEURONTIN) 100 MG capsule TAKE 3 BY MOUTH ONCE DAILY WITH SUPPER FOR RESTLESS LEG  . glipiZIDE (GLUCOTROL XL) 5 MG 24 hr tablet Take one tablet by mouth once daily to control blood sugar (Patient taking differently: 10 mg. Take one tablet by mouth once daily to control blood sugar)  . metFORMIN (GLUCOPHAGE) 1000 MG tablet Take one tablet by mouth twice daily to control blood sugar  . mupirocin ointment (BACTROBAN) 2 % Apply nightly to lesion on scrotum  . simvastatin (ZOCOR) 40 MG tablet Take one tablet by mouth once daily for cholesterol  . Syringe/Needle, Disp, (SYRINGE 3CC/25GX1") 25G X 1" 3 ML MISC Use as  directed with Cyanocobalmin (Patient not taking: Reported on 01/31/2014)  . tamsulosin (FLOMAX) 0.4 MG CAPS capsule Take one tablet by mouth once daily at bedtime to prevent urinary frequency  . Tdap (BOOSTRIX) 5-2.5-18.5 LF-MCG/0.5 injection Inject 0.5 mLs into the muscle once.    PAST MEDICAL HISTORY:   Past Medical History  Diagnosis Date  . Type 2 diabetes mellitus with autonomic neuropathy   . Hyperlipidemia   . Hypertension   . Arthritis   . Diverticulitis   . Parkinsonism   . Right knee pain   . Memory loss   . Depression   . Gait disturbance   .  Restless leg   . Thyroid disease   . Vitamin D deficiency   . Osteoarthritis   . Osteoporosis   . Dysphagia, idiopathic     History of aspiration  . Recurrent falls     Using a cane and walker  . Dizzy   . Hearing loss of both ears   . Neck pain   . Thoracic back pain   . Lumbar back pain   . Shoulder pain   . Cyanocobalamin deficiency   . Other and unspecified hyperlipidemia   . Unspecified essential hypertension   . Abnormality of gait   . Unspecified hypothyroidism   . Restless legs syndrome (RLS)     PAST SURGICAL HISTORY:   Past Surgical History  Procedure Laterality Date  . Cataract extraction Bilateral 2012    Dr. Hazle Barnett    SOCIAL HISTORY:   History   Social History  . Marital Status: Married    Spouse Name: Nicholas Barnett    Number of Children: 3  . Years of Education: College   Occupational History  . Retired     Engineer, petroleum [employer]   Social History Main Topics  . Smoking status: Former Smoker    Quit date: 01/31/1974  . Smokeless tobacco: Never Used  . Alcohol Use: No  . Drug Use: No  . Sexual Activity: No   Other Topics Concern  . Not on file   Social History Narrative   Married 1956. Returned from a Production designer, theatre/television/film for [employer].   Patient lives at home with his family.   Caffeine Use: none   Patient has a living will and healthcare power of attorney.    FAMILY HISTORY:   Family Status  Relation Status Death Age  . Mother Deceased 82    stroke, MI  . Father Deceased 52    prostate cancer  . Sister Deceased 30    diabetic, cirrhosis  . Brother Deceased 10 months    crib death  . Brother Deceased 64    MI    ROS:  A complete 10 system review of systems was obtained and was unremarkable apart from what is mentioned above.  PHYSICAL EXAMINATION:    VITALS:   Filed Vitals:   02/13/14 1016  BP: 120/70  Pulse: 80    GEN:  The patient appears stated age and is in NAD.  Some pathologic laughter. HEENT:  Normocephalic, atraumatic.  The mucous  membranes are moist. The superficial temporal arteries are without ropiness or tenderness. CV:  RRR.  Some inspirational stridor Lungs:  CTAB Neck/HEME:  There are no carotid bruits bilaterally.  There is neck flexion.    Neurological examination:  Orientation: The patient is alert and oriented x3. Fund of knowledge is appropriate.  Recent and remote memory are intact.  Attention and concentration are normal.    Able to name  objects and repeat phrases. Cranial nerves: There is good facial symmetry. There is facial hypomimia.   There are square wave jerks.  The visual fields are full to confrontational testing. The speech is fluent and clear. Soft palate rises symmetrically and there is no tongue deviation. Hearing is intact to conversational tone. Sensation: Sensation is intact to light and pinprick throughout (facial, trunk, extremities). Vibration is decreased distally. There is no extinction with double simultaneous stimulation. There is no sensory dermatomal level identified. Motor: Strength is 5/5 in the bilateral upper extremities and left lower extremity.  Strength is only 3/5 in the proximal RLE and is 4/5 in the distal RLE (pt states chronic as does family).   Shoulder shrug is equal and symmetric.  There is no pronator drift.   Movement examination: Tone: There is increased tone in the RUE.  There is normal tone in the LUE and bilateral lower extremities.   Abnormal movements: none Coordination:  There is decremation with RAM's, seen with finger taps, hand opening and closing, heel taps, and toe taps, especially on the right.   Gait and Station: The patient has  difficulty arising out of a deep-seated chair without the use of the hands and requires assistance to get out of the chair. The patient's stride length is decreased and the R foot freezes especially with turns but othertimes as well.  He is very unstable.      A complete UPDRS motor on/off test is done and documented on another  worksheet.  UPDRS motor off score was 50.  The patient was then given 250 mg of levodopa dissolved in ginger ale and waited for another 40 minutes.  UPDRS motor on score was 40.  ASSESSMENT/PLAN:  1.  Multiple system atrophy  -Minimal response to levodopa.  Tremor responded more than balance.  Wife would like to try at home and see how he does with levodopa for few months.  Pt doesn't wake up until 11am-12noon so will have him work up with time so that he takes 2 at 11am/2 at 3 pm/1 at 7pm.  Will let me know if any SE.  Risks, benefits, side effects and alternative therapies were discussed.  The opportunity to ask questions was given and they were answered to the best of my ability.  The patient expressed understanding and willingness to follow the outlined treatment protocols.  - We discussed safety.  Actually has far more difficulty walking with walker than without it BUT is very unsteady.  Walker seems to initiate freezing.  -called neurorehab today as no one had called him re: ST/PT referral 2.  Dysphagia.  -Pt had MBE on 02/05/14 and demonstrated mild oral and mod pharyngeal phase dysphagia and mechanical soft with honey thick liquid was recommended. 3.  Sialorrhea.  -Talked about the value of Myobloc.  He will let me know if and when he would like to proceed. 4.  I will follow up with in the next few months, sooner should new neuro issues arise. 5.  Much greater than 50% of this visit was spent in counseling with the patient and the family.  Total face to face time:  60 min

## 2014-02-13 NOTE — Patient Instructions (Signed)
1. Start Carbidopa Levodopa as follows: 1 tablet at 11 am, 1 tablet at 4 pm, and 1 tablet at 7 pm for 2 weeks. If tolerated you can increase to 2 tablets at 11 am, 2 tablets at 4 pm, and 1 tablet at 7 pm.

## 2014-02-28 ENCOUNTER — Ambulatory Visit: Payer: PPO

## 2014-02-28 ENCOUNTER — Ambulatory Visit: Payer: PPO | Attending: Orthopedic Surgery

## 2014-02-28 DIAGNOSIS — R490 Dysphonia: Secondary | ICD-10-CM

## 2014-02-28 DIAGNOSIS — R1312 Dysphagia, oropharyngeal phase: Secondary | ICD-10-CM | POA: Diagnosis not present

## 2014-02-28 DIAGNOSIS — G20 Parkinson's disease: Secondary | ICD-10-CM

## 2014-02-28 DIAGNOSIS — R531 Weakness: Secondary | ICD-10-CM | POA: Diagnosis not present

## 2014-02-28 DIAGNOSIS — R269 Unspecified abnormalities of gait and mobility: Secondary | ICD-10-CM | POA: Diagnosis not present

## 2014-02-28 NOTE — Therapy (Signed)
[facility] [address] [place], [place], [address] Phone: [phone]   Fax:  [phone]  Speech Language Pathology Evaluation  Patient Details  Name: Nicholas Barnett MRN: [id number] Date of Birth: [birth year] Referring Provider:  Kimber Relic, MD  Encounter Date: 02/28/2014      End of Session - 02/28/14 1437    Visit Number 1   Number of Visits 16   Date for SLP Re-Evaluation 04/28/14   Authorization Type blue medicare   Authorization Time Period info told to LL 02-28-14   SLP Start Time 1017   SLP Stop Time  1102   SLP Time Calculation (min) 45 min   Activity Tolerance Patient tolerated treatment well      Past Medical History  Diagnosis Date  . Type 2 diabetes mellitus with autonomic neuropathy   . Hyperlipidemia   . Hypertension   . Arthritis   . Diverticulitis   . Parkinsonism   . Right knee pain   . Memory loss   . Depression   . Gait disturbance   . Restless leg   . Thyroid disease   . Vitamin D deficiency   . Osteoarthritis   . Osteoporosis   . Dysphagia, idiopathic     History of aspiration  . Recurrent falls     Using a cane and walker  . Dizzy   . Hearing loss of both ears   . Neck pain   . Thoracic back pain   . Lumbar back pain   . Shoulder pain   . Cyanocobalamin deficiency   . Other and unspecified hyperlipidemia   . Unspecified essential hypertension   . Abnormality of gait   . Unspecified hypothyroidism   . Restless legs syndrome (RLS)     Past Surgical History  Procedure Laterality Date  . Cataract extraction Bilateral 2012    Dr. Hazle Quant    There were no vitals taken for this visit.  Visit Diagnosis: Hypokinetic Parkinsonian dysphonia - Plan: SLP plan of care cert/re-cert  Dysphagia, oropharyngeal phase - Plan: SLP plan of care cert/re-cert      Subjective Assessment - 02/28/14 1432    Symptoms Pt reports the currrent modified barium swallow (MBSS) "didn't help  at all." Pt's wife states pt is not thickening liquids, nor is he taking additional swallows or using chin tuck.          SLP Evaluation [facility] - 02/28/14 1436    SLP Visit Information   Onset Date last 6 months   Medical Diagnosis Parkinsonism   Subjective   Subjective pt reports last modified was not helpful. Pt/wife report pt not following precautions given in that exam.   Cognition   Memory Comments pt noted to ask wife for details with PMH, usually   Motor Speech   Intelligibility Intelligibility reduced   Conversation 75-100% accurate   Motor Planning Witnin functional limits   Interfering Components Premorbid status     Given pt's "s" above, much of evaluation today was SLP educating pt (and wife) of modified barium swallow (MBSS) results and explaining rationale of recommendations to pt, as he was not following any of these precautions at home. See "education" for details regarding what SLP educated pt/wife about during evaluation session.  SLP shared with pt that he was going to be initiated a home exercise program for strengthening swallowing musculature next session and he agreed to complete this program in order to improve swallow strength and decr risk of  aspiration and thus further medical complications. SLP explained why SLP wanted to address swallowing initially and then address speech loudness (as loudness was pt's primary concern).  SLP also informally assessed pt's speech loudness, average 67dB with range of 62-71dB. This is outside the normal range of 70-72dB with sound level meter placed 30 cm away from pt's mouth.        SLP Education - 2014/03/16 1441    Education provided Yes   Education Details dys III diet, need for honey liquids and rationale, safe swallow techniques, results of modified barium swallow, what is happening physiologically with pt's "gurgly" voice and how to eliminate this   Person(s) Educated Patient;Spouse   Methods  Explanation;Demonstration;Verbal cues   Comprehension Verbalized understanding;Returned demonstration          SLP Short Term Goals - 03-16-14 1446    SLP SHORT TERM GOAL #1   Title pt to complete HEP with min-mod A occasionally   Time 4   Period Weeks   Status New   SLP SHORT TERM GOAL #2   Title pt will obtain appropriate assistance PRN with dysphagia HEP from wife or caregiver   Time 4   Period Weeks   Status New   SLP SHORT TERM GOAL #3   Title pt will demo appropriate swallow precautions with POs during therapy sessions with occasional min A   Time 4   Period Weeks   Status New   SLP SHORT TERM GOAL #4   Title pt will improve loudness to 70dB in 8/10 sentence responses with rare min A   Time 4   Period Weeks   Status New          SLP Long Term Goals - 16-Mar-2014 1448    SLP LONG TERM GOAL #1   Title pt will improve loudness to 70dB over 7 minutes conversation with occasional min A   Time 8   Period Weeks   Status New   SLP LONG TERM GOAL #2   Title pt will complete HEP for dysphagia with min A, occasionally   Time 8   Period Weeks   Status New   SLP LONG TERM GOAL #3   Title pt will demo swallow strategies with POs with rare min A   Time 8   Period Weeks   Status New          Plan - 2014/03/16 1443    Clinical Impression Statement pt presents with need for ST due to mild-mod dysphagia - pt not completing safe swallow techniques at home. Pt would benefit from skilled ST to address swallowing muscle weakness to minimize aspiration risk. Pt also exhibits reduced speech loudness affecting pt's ability to communicate with family and others.    Speech Therapy Frequency 2x / week   Duration --  8 weeks   Treatment/Interventions Aspiration precaution training;Pharyngeal strengthening exercises;Oral motor exercises;Diet toleration management by SLP;Trials of upgraded texture/liquids;Cueing hierarchy;Functional tasks;Patient/family education;Compensatory  strategies;SLP instruction and feedback   Potential to Achieve Goals Good   Potential Considerations Ability to learn/carryover information;Medical prognosis   SLP Home Exercise Plan SLP told pt he must complete HEP for dysphagia in order to strengthen swallowing musculature and improve swallow function.   Consulted and Agree with Plan of Care Patient;Family member/caregiver          G-Codes - 03-16-14 1451    Functional Assessment Tool Used noms   Functional Limitations Swallowing   Swallow Current Status [phone]) At least 60 percent but less  than 80 percent impaired, limited or restricted   Swallow Goal Status [phone]([id number]) At least 40 percent but less than 60 percent impaired, limited or restricted      Problem List Patient Active Problem List   Diagnosis Date Noted  . Insomnia 10/17/2013  . Cellulitis, scrotum 10/17/2013  . Pain in joint, shoulder region 01/25/2013  . Dysphagia, idiopathic   . Recurrent falls   . Dizzy   . Hearing loss of both ears   . Neck pain   . Right knee pain   . Thoracic back pain   . Lumbar back pain   . Shoulder pain   . Cyanocobalamin deficiency   . Type 2 diabetes mellitus with autonomic neuropathy   . Restless legs syndrome (RLS)   . Parkinson's plus syndrome 11/24/2012  . Gait difficulty 11/24/2012  . NOCTURIA 05/08/2009  . SOMNOLENCE 04/11/2009  . MEMORY LOSS 04/11/2009  . ECZEMA 03/21/2009  . HYPERTROPHY PROSTATE W/UR OBST & OTH LUTS 12/25/2008  . OSTEOARTHRITIS, KNEE, RIGHT 12/25/2008  . OSTEOPOROSIS 12/25/2008  . URINARY URGENCY 12/25/2008  . VITAMIN D DEFICIENCY 12/22/2007  . DIVERTICULITIS, HX OF 06/01/2007  . HYPOTHYROIDISM 12/16/2006  . HYPERLIPIDEMIA 12/16/2006  . ERECTILE DYSFUNCTION 12/16/2006  . HYPERTENSION 12/16/2006    Verdie MosherSCHINKE,Argie Applegate, SLP 02/28/2014, 3:41 PM  [facility] [address] [place], [place], [address] Phone: [phone]   Fax:  [phone]

## 2014-02-28 NOTE — Patient Instructions (Signed)
  Complete home exercises - we will start these next visit   EATING DIRECTIONS  Small bites/sips  Swallow 2-3 times each bite or sip  TUCK YOUR CHIN!  No dry or crusty things or nuts to eat Take pills with applesauce or yogurt, or pudding   Pt was provided a dysphagia III (soft diet) handout to assist with what to eat and what not to eat

## 2014-02-28 NOTE — Therapy (Signed)
[facility] [address] [place], [place], [address] Phone: [phone]   Fax:  [phone]  Physical Therapy Evaluation  Patient Details  Name: Nicholas Barnett MRN: [id number] Date of Birth: [birth year] Referring Provider:  Kimber Relic, MD  Encounter Date: 02/28/2014      PT End of Session - 02/28/14 1706    Visit Number 1   Number of Visits 17   Date for PT Re-Evaluation 04/29/14   Authorization Type G-code every 10th visit.   PT Start Time 1147   PT Stop Time 1231   PT Time Calculation (min) 44 min   Equipment Utilized During Treatment Gait belt   Activity Tolerance Patient tolerated treatment well   Behavior During Therapy Flat affect      Past Medical History  Diagnosis Date  . Type 2 diabetes mellitus with autonomic neuropathy   . Hyperlipidemia   . Hypertension   . Arthritis   . Diverticulitis   . Parkinsonism   . Right knee pain   . Memory loss   . Depression   . Gait disturbance   . Restless leg   . Thyroid disease   . Vitamin D deficiency   . Osteoarthritis   . Osteoporosis   . Dysphagia, idiopathic     History of aspiration  . Recurrent falls     Using a cane and walker  . Dizzy   . Hearing loss of both ears   . Neck pain   . Thoracic back pain   . Lumbar back pain   . Shoulder pain   . Cyanocobalamin deficiency   . Other and unspecified hyperlipidemia   . Unspecified essential hypertension   . Abnormality of gait   . Unspecified hypothyroidism   . Restless legs syndrome (RLS)     Past Surgical History  Procedure Laterality Date  . Cataract extraction Bilateral 2012    Dr. Hazle Quant    There were no vitals taken for this visit.  Visit Diagnosis:  Gait difficulty - Plan: PT plan of care cert/re-cert  Abnormality of gait - Plan: PT plan of care cert/re-cert  Weakness generalized - Plan: PT plan of care cert/re-cert      Subjective Assessment - 02/28/14 1157    Symptoms  difficulty walking, impaired balance, frequent falls   Pertinent History Parkinsonism, diabetes, neuropathy, B shoulder bursitis, memory loss   Patient Stated Goals be more independent, walk better, stop falling   Currently in Pain? Yes   Pain Score 7    Pain Location --  "in every joint"   Pain Orientation Right;Left   Pain Descriptors / Indicators Aching   Pain Type Chronic pain   Pain Onset More than a month ago   Pain Frequency Intermittent   Aggravating Factors  pt is not sure if anything makes it better or worse          [facility] PT Assessment - 02/28/14 1201    Assessment   Medical Diagnosis Multiple system atrophy   Onset Date 02/16/09   Prior Therapy HHPT 2 years ago   Precautions   Precautions Fall   Restrictions   Weight Bearing Restrictions No   Balance Screen   Has the patient fallen in the past 6 months Yes   How many times? 6   Has the patient had a decrease in activity level because of a fear of falling?  Yes   Is the patient reluctant to leave their home because of a  fear of falling?  No   Home Environment   Living Enviornment Private residence   Living Arrangements Spouse/significant other;Other (Comment)  spouse:Becky and two grandsons   Available Help at Discharge Family   Type of Home House   Home Access Stairs to enter   Entrance Stairs-Number of Steps 2   Entrance Stairs-Rails None   Home Layout Two level;Able to live on main level with bedroom/bathroom   Alternate Level Stairs-Number of Steps 12   Alternate Level Stairs-Rails Right   Home Equipment Cane - single point;Grab bars - tub/shower;Walker - 2 wheels;Walker - 4 wheels;Shower seat - built in  bed crutch (rails)   Prior Function   Level of Independence Requires assistive device for independence;Needs assistance with ADLs;Independent with gait   Vocation Retired  retired Teacher, 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   Coordination   Gross Motor Movements are Fluid and Coordinated Yes   Fine Motor Movements are Fluid and Coordinated No   Finger Nose Finger Test decreased time in B hands   Posture/Postural Control   Posture/Postural Control Postural limitations   Postural Limitations Rounded Shoulders;Forward head   AROM   Overall AROM  Deficits   Overall AROM Comments B shoulder flexion limited to approx. 80 degrees due to shoulder pain (chronic bursitis). Otherwise B LE AROM WFL.   Strength   Overall Strength Deficits   Overall Strength Comments L LE grossly 4/5-5/5. R hip flex: 3/5, R knee ext: 4/5, R knee flex: 3+/5, dorsiflexion 3+/5.   Transfers   Transfers Sit to Stand;Stand to Sit   Sit to Stand 4: Min guard;With upper extremity assist;With armrests;From chair/3-in-1   Stand to Sit 4: Min guard;With upper extremity assist;With armrests;To chair/3-in-1   Ambulation/Gait   Ambulation/Gait Yes   Ambulation/Gait Assistance 4: Min guard;4: Min assist  min A during turns   Ambulation/Gait Assistance Details Pt ambulated over even terrain, with 1 LOB noted while turning. Pt noted to experience several freezing episodes by door frames, and had difficulty sequencing with SPC and ambulated in more fluid manner without SPC. However, pt would benefit from ambulating with AD due to impaired balance.   Ambulation Distance (Feet) 100 Feet   Assistive device Straight cane;None   Gait Pattern Step-through pattern;Decreased stride length;Decreased dorsiflexion - left;Decreased dorsiflexion - right;Shuffle;Trunk flexed  occasional shuffle   Gait velocity 2.37ft/sec.  : 16.1sec. without  SPC   Balance   Balance Assessed Yes   Static Standing Balance   Static Standing - Balance Support No upper extremity supported   Static Standing - Level of Assistance 4: Min assist;Other (comment)  min guard   Static Standing - Comment/# of Minutes Pt able to stand with feet apart and min guard, but required min A to place feet together and during single leg stance   Standardized Balance Assessment   Standardized Balance Assessment Timed Up and Go Test   Timed Up and Go Test   TUG Normal TUG   Normal TUG (seconds) 22.96  without SPC  PT Education - March 23, 2014 1705    Education provided Yes   Education Details PT educated pt on the benefits of PT and the importance to reduce falls risk.   Person(s) Educated Patient;Spouse  Becky: wife   Methods Explanation   Comprehension Verbalized understanding;Need further instruction          PT Short Term Goals - 23-Mar-2014 1708    PT SHORT TERM GOAL #1   Title Pt will be independent in HEP to improve funcitonal mobility. Target date: 03/28/14.   Status New   PT SHORT TERM GOAL #2   Title Assess BERG and write STG/LTGs if appropriate. Target date: 03/28/14.   Status New   PT SHORT TERM GOAL #3   Title Pt will report no falls in the last 2 weeks to improve safety during mobility. Target date: 03/28/14.   Status New   PT SHORT TERM GOAL #4   Title Pt will perform TUG, with LRAD, in <19 second to reduce falls risk. Target date: 03/28/14.   Status New   PT SHORT TERM GOAL #5   Title Pt will ambulate 200' with LRAD over even/uneven surface with supervision to improve functional mobility. Target date: 03/28/14.   Status New           PT Long Term Goals - Mar 23, 2014 1711    PT LONG TERM GOAL #1   Title Pt will verbalize understanding and agreement of fall prevention strategies to reduce falls risk. Target date: 04/25/14.   Status New   PT LONG TERM GOAL #2   Title Pt will perform TUG, with LRAD, in <16  second to reduce falls risk. Target date: 04/25/14.   Status New   PT LONG TERM GOAL #3   Title Pt will ambulate 350' with LRAD over even/uneven surface at MOD I level to improve functional mobility. Target date: 04/25/14.   Status New   PT LONG TERM GOAL #4   Title Pt will ambulate >/=2.60ft/sec., with LRAD, to improve functional mobility. Target date: 04/25/14.   Status New               Plan - 03-23-14 1153    Clinical Impression Statement Pt is a 81 y/o male presenting to [facility] neuro with difficulty walking and impaired balance. Pt has history of multiple system atrophy, which began approx. 5 years ago. Pt reported 6+ falls in the last six months. Per MD, pt has Parkinsonism with minimal response to Levodopa. Pt reported that "backing up, standing up, turning, and getting out of bed" are difficult.  Pt is skeptical of benefits of PT, but wife is very worried about pt falling.   Pt will benefit from skilled therapeutic intervention in order to improve on the following deficits Difficulty walking;Abnormal gait;Decreased endurance;Decreased safety awareness;Decreased knowledge of use of DME;Decreased balance;Decreased strength;Decreased mobility   Rehab Potential Fair   PT Frequency 2x / week   PT Duration 8 weeks   PT Treatment/Interventions ADLs/Self Care Home Management;Gait training;Neuromuscular re-education;Stair training;Functional mobility training;Patient/family education;Biofeedback;Therapeutic activities;Therapeutic exercise;Manual techniques;Electrical Stimulation;DME Instruction;Balance training   PT Next Visit Plan Perform BERG and initiate balance/strength HEP.   Consulted and Agree with Plan of Care Patient          G-Codes - 03-23-2014 1713    Functional Assessment Tool Used TUG without AD: 22.96sec.; gait speed without AD: 2.86ft/sec.   Functional Limitation Mobility: Walking and moving around   Mobility: Walking and Moving Around Current Status [phone]) At least 40 percent  but  less than 60 percent impaired, limited or restricted   Mobility: Walking and Moving Around Goal Status [phone]([id number]) At least 1 percent but less than 20 percent impaired, limited or restricted       Problem List Patient Active Problem List   Diagnosis Date Noted  . Insomnia 10/17/2013  . Cellulitis, scrotum 10/17/2013  . Pain in joint, shoulder region 01/25/2013  . Dysphagia, idiopathic   . Recurrent falls   . Dizzy   . Hearing loss of both ears   . Neck pain   . Right knee pain   . Thoracic back pain   . Lumbar back pain   . Shoulder pain   . Cyanocobalamin deficiency   . Type 2 diabetes mellitus with autonomic neuropathy   . Restless legs syndrome (RLS)   . Parkinson's plus syndrome 11/24/2012  . Gait difficulty 11/24/2012  . NOCTURIA 05/08/2009  . SOMNOLENCE 04/11/2009  . MEMORY LOSS 04/11/2009  . ECZEMA 03/21/2009  . HYPERTROPHY PROSTATE W/UR OBST & OTH LUTS 12/25/2008  . OSTEOARTHRITIS, KNEE, RIGHT 12/25/2008  . OSTEOPOROSIS 12/25/2008  . URINARY URGENCY 12/25/2008  . VITAMIN D DEFICIENCY 12/22/2007  . DIVERTICULITIS, HX OF 06/01/2007  . HYPOTHYROIDISM 12/16/2006  . HYPERLIPIDEMIA 12/16/2006  . ERECTILE DYSFUNCTION 12/16/2006  . HYPERTENSION 12/16/2006    Cruze Zingaro L 02/28/2014, 5:17 PM  [facility] [address] [place], [place], [address] Phone: [phone]   Fax:  [phone]  Zerita BoersJennifer Lovene Maret, PT,DPT 02/28/2014 5:17 PM Phone: [phone] Fax: [phone]

## 2014-03-05 ENCOUNTER — Telehealth: Payer: Self-pay | Admitting: Neurology

## 2014-03-05 ENCOUNTER — Other Ambulatory Visit: Payer: Self-pay

## 2014-03-05 DIAGNOSIS — N492 Inflammatory disorders of scrotum: Secondary | ICD-10-CM

## 2014-03-05 MED ORDER — MUPIROCIN 2 % EX OINT
TOPICAL_OINTMENT | CUTANEOUS | Status: DC
Start: 2014-03-05 — End: 2014-05-15

## 2014-03-05 MED ORDER — PRODIGY LANCETS 21G MISC: Status: DC

## 2014-03-05 MED ORDER — GLUCOSE BLOOD VI STRP: ORAL_STRIP | Status: DC

## 2014-03-05 MED ORDER — CARBIDOPA-LEVODOPA 25-100 MG PO TABS: ORAL_TABLET | ORAL | Status: DC

## 2014-03-05 NOTE — Telephone Encounter (Signed)
Levodopa with dosage change sent to pharmacy.

## 2014-03-05 NOTE — Telephone Encounter (Signed)
Patient's wife called requesting refills for Prodigy test strips and bactroban cream to be sent to mail order. RX request for prodigy lancets to be sent to local [facility].  RX's sent

## 2014-03-05 NOTE — Telephone Encounter (Addendum)
Patient's wife called back requesting rx for lancets and test strips to be voided or cancelled because she will get them through [facility].  I called [facility] and cancelled rx. I also called [facility] and cancelled rx

## 2014-03-05 NOTE — Telephone Encounter (Signed)
Nicholas Barnett, pt's wife called requesting for Dr. Arbutus Leasat to call in carbidopa-levodopa but the increase dosage. C/b [phone]

## 2014-03-06 ENCOUNTER — Telehealth: Payer: Self-pay | Admitting: Neurology

## 2014-03-08 ENCOUNTER — Ambulatory Visit: Payer: PPO | Admitting: Physical Therapy

## 2014-03-08 DIAGNOSIS — R269 Unspecified abnormalities of gait and mobility: Secondary | ICD-10-CM

## 2014-03-08 DIAGNOSIS — R490 Dysphonia: Secondary | ICD-10-CM | POA: Diagnosis not present

## 2014-03-08 NOTE — Patient Instructions (Addendum)
Knee Extension: Sit to Stand (Eccentric)   Stand close to chair. Slowly lower self to seated position. ___ reps per set, ___ sets per day, ___ days per week. Progress to stopping midway before lowering to chair. Progress to barely touching chair.  Copyright  VHI. All rights reserved.  Also instructed in POWER exercises - functional sit to stand, forward, back and side reaching without UE movement due to C/o pain in left shoulder due to bursitis

## 2014-03-09 ENCOUNTER — Encounter: Payer: Self-pay | Admitting: Physical Therapy

## 2014-03-09 NOTE — Therapy (Signed)
[facility] [address] [place], [place], [address] Phone: [phone]   Fax:  [phone]  Physical Therapy Treatment  Patient Details  Name: Nicholas Barnett MRN: [id number] Date of Birth: 1931-05-06 Referring Provider:  Kimber Relic, MD  Encounter Date: 03/08/2014      PT End of Session - 03/09/14 1116    Visit Number 2  G2   Number of Visits 17   Date for PT Re-Evaluation 04/29/14   Authorization Type G-code every 10th visit.   PT Start Time 1535   PT Stop Time 1620   PT Time Calculation (min) 45 min   Equipment Utilized During Treatment Gait belt   Activity Tolerance Patient tolerated treatment well      Past Medical History  Diagnosis Date  . Type 2 diabetes mellitus with autonomic neuropathy   . Hyperlipidemia   . Hypertension   . Arthritis   . Diverticulitis   . Parkinsonism   . Right knee pain   . Memory loss   . Depression   . Gait disturbance   . Restless leg   . Thyroid disease   . Vitamin D deficiency   . Osteoarthritis   . Osteoporosis   . Dysphagia, idiopathic     History of aspiration  . Recurrent falls     Using a cane and walker  . Dizzy   . Hearing loss of both ears   . Neck pain   . Thoracic back pain   . Lumbar back pain   . Shoulder pain   . Cyanocobalamin deficiency   . Other and unspecified hyperlipidemia   . Unspecified essential hypertension   . Abnormality of gait   . Unspecified hypothyroidism   . Restless legs syndrome (RLS)     Past Surgical History  Procedure Laterality Date  . Cataract extraction Bilateral 2012    Dr. Hazle Quant    There were no vitals taken for this visit.  Visit Diagnosis:  Abnormality of gait      Subjective Assessment - 03/09/14 1112    Symptoms Pt. reports his right knee is really bothering him today -"don't know how much walking I can do"   Pertinent History Parkinsonism, diabetes, neuropathy, B shoulder bursitis, memory loss   Patient Stated Goals be more independent, walk better, stop falling   Currently in Pain? Yes   Pain Score 6    Pain Location Knee   Pain Orientation Right   Pain Descriptors / Indicators Aching   Pain Type Chronic pain   Pain Onset More than a month ago                    [facility] Adult PT Treatment/Exercise - 03/09/14 0001    Berg Balance Test   Sit to Stand Able to stand  independently using hands   Standing Unsupported Able to stand 2 minutes with supervision   Sitting with Back Unsupported but Feet Supported on Floor or Stool Able to sit safely and securely 2 minutes   Stand to Sit Controls descent by using hands   Transfers Able to transfer safely, definite need of hands   Standing Unsupported with Eyes Closed Able to stand 10 seconds with supervision   Standing Ubsupported with Feet Together Able to place feet together independently and stand for 1 minute with supervision   From Standing, Reach Forward with Outstretched Arm Can reach forward >12 cm safely (5")   From Standing Position, Pick up Object  from Floor Able to pick up shoe, needs supervision  8"   From Standing Position, Turn to Look Behind Over each Shoulder Turn sideways only but maintains balance   Turn 360 Degrees Needs close supervision or verbal cueing   Standing Unsupported, Alternately Place Feet on Step/Stool Able to complete >2 steps/needs minimal assist   Standing Unsupported, One Foot in Front Able to plae foot ahead of the other independently and hold 30 seconds   Standing on One Leg Able to lift leg independently and hold equal to or more than 3 seconds  4.78 secs on LLE   Total Score 37      Pt. Performed POWER exercises - functional sit to stand with UE support as needed x 10 reps from hi/lo mat table With CGA to SBA; performed POWER stepping ex. For balance including stepping forward, back and side - UE movement  initially attemtped with trunk rotation but pt. Reported too painful in L  shoulder due to bursitis so omittted UE movement And pt. Performed stepping portion only Wife present and verbalized understanding for HEP          PT Education - 03/09/14 1116    Education provided Yes   Education Details sit to stand, POWER stepping forward, back and side   Person(s) Educated Patient;Spouse   Methods Explanation;Demonstration;Handout   Comprehension Verbalized understanding;Returned demonstration          PT Short Term Goals - 02/28/14 1708    PT SHORT TERM GOAL #1   Title Pt will be independent in HEP to improve funcitonal mobility. Target date: 03/28/14.   Status New   PT SHORT TERM GOAL #2   Title Assess BERG and write STG/LTGs if appropriate. Target date: 03/28/14.   Status New   PT SHORT TERM GOAL #3   Title Pt will report no falls in the last 2 weeks to improve safety during mobility. Target date: 03/28/14.   Status New   PT SHORT TERM GOAL #4   Title Pt will perform TUG, with LRAD, in <19 second to reduce falls risk. Target date: 03/28/14.   Status New   PT SHORT TERM GOAL #5   Title Pt will ambulate 200' with LRAD over even/uneven surface with supervision to improve functional mobility. Target date: 03/28/14.   Status New           PT Long Term Goals - 02/28/14 1711    PT LONG TERM GOAL #1   Title Pt will verbalize understanding and agreement of fall prevention strategies to reduce falls risk. Target date: 04/25/14.   Status New   PT LONG TERM GOAL #2   Title Pt will perform TUG, with LRAD, in <16 second to reduce falls risk. Target date: 04/25/14.   Status New   PT LONG TERM GOAL #3   Title Pt will ambulate 350' with LRAD over even/uneven surface at MOD I level to improve functional mobility. Target date: 04/25/14.   Status New   PT LONG TERM GOAL #4   Title Pt will ambulate >/=2.404ft/sec., with LRAD, to improve functional mobility. Target date: 04/25/14.   Status New               Plan - 03/09/14 1117    Clinical Impression Statement Pt.'s  performance much improved at end of session compared to at beginning of session due to medication taking effect per wife's report; performance varies depending on medication cycle   Pt will benefit from skilled therapeutic intervention in  order to improve on the following deficits Difficulty walking;Abnormal gait;Decreased endurance;Decreased safety awareness;Decreased knowledge of use of DME;Decreased balance;Decreased strength;Decreased mobility   Rehab Potential Fair   PT Frequency 2x / week   PT Duration 8 weeks   PT Treatment/Interventions ADLs/Self Care Home Management;Gait training;Neuromuscular re-education;Stair training;Functional mobility training;Patient/family education;Biofeedback;Therapeutic activities;Therapeutic exercise;Manual techniques;Electrical Stimulation;DME Instruction;Balance training   PT Next Visit Plan check HEP; cont gait and balance   Consulted and Agree with Plan of Care Patient;Family member/caregiver        Problem List Patient Active Problem List   Diagnosis Date Noted  . Insomnia 10/17/2013  . Cellulitis, scrotum 10/17/2013  . Pain in joint, shoulder region 01/25/2013  . Dysphagia, idiopathic   . Recurrent falls   . Dizzy   . Hearing loss of both ears   . Neck pain   . Right knee pain   . Thoracic back pain   . Lumbar back pain   . Shoulder pain   . Cyanocobalamin deficiency   . Type 2 diabetes mellitus with autonomic neuropathy   . Restless legs syndrome (RLS)   . Parkinson's plus syndrome 11/24/2012  . Gait difficulty 11/24/2012  . NOCTURIA 05/08/2009  . SOMNOLENCE 04/11/2009  . MEMORY LOSS 04/11/2009  . ECZEMA 03/21/2009  . HYPERTROPHY PROSTATE W/UR OBST & OTH LUTS 12/25/2008  . OSTEOARTHRITIS, KNEE, RIGHT 12/25/2008  . OSTEOPOROSIS 12/25/2008  . URINARY URGENCY 12/25/2008  . VITAMIN D DEFICIENCY 12/22/2007  . DIVERTICULITIS, HX OF 06/01/2007  . HYPOTHYROIDISM 12/16/2006  . HYPERLIPIDEMIA 12/16/2006  . ERECTILE DYSFUNCTION 12/16/2006   . HYPERTENSION 12/16/2006    Kary Kos, PT 03/09/2014, 11:20 AM  [facility] [address] [place], [place], [address] Phone: [phone]   Fax:  [phone]

## 2014-03-12 ENCOUNTER — Ambulatory Visit: Payer: PPO | Admitting: Physical Therapy

## 2014-03-12 ENCOUNTER — Encounter: Payer: Self-pay | Admitting: Physical Therapy

## 2014-03-12 DIAGNOSIS — R490 Dysphonia: Secondary | ICD-10-CM | POA: Diagnosis not present

## 2014-03-12 DIAGNOSIS — R269 Unspecified abnormalities of gait and mobility: Secondary | ICD-10-CM

## 2014-03-12 DIAGNOSIS — R531 Weakness: Secondary | ICD-10-CM

## 2014-03-12 NOTE — Patient Instructions (Signed)
HIP: Hamstrings - Short Sitting   Rest leg on raised surface. Keep knee straight with light pressure from both hands. Lift chest and lean forward until you feel a stretch in back of leg. Hold __20-30_ seconds._2_ reps per leg, _2-3 times__ per day, __7_ days per week  Copyright  VHI. All rights reserved.  ANKLE: Dorsiflexion - Sitting   Sitting, place strap around foot. Pull foot toward body, keeping heel on floor. Keep foot straight. Hold _20-30__ seconds. __2_ reps per leg, _2-3 times__ per day, _7__ days per week  Copyright  VHI. All rights reserved.    Please combine the above 2 exercises  Together: pull your foot up then lean forward Hold 20-30 seconds 2 reps per leg. 2-3 times per day, 7 days /wk

## 2014-03-12 NOTE — Therapy (Signed)
[facility] [address] [place], [place], [address] Phone: [phone]   Fax:  [phone]  Physical Therapy Treatment  Patient Details  Name: Nicholas Barnett MRN: [id number] Date of Birth: 31-Oct-1931 Referring Provider:  Kimber Relic, MD  Encounter Date: 03/12/2014      PT End of Session - 03/12/14 1445    Visit Number 3  G2   Number of Visits 17   Date for PT Re-Evaluation 04/29/14   Authorization Type G-code every 10th visit.   PT Start Time 1445   PT Stop Time 1530   PT Time Calculation (min) 45 min   Equipment Utilized During Treatment Gait belt   Activity Tolerance Patient tolerated treatment well   Behavior During Therapy WFL for tasks assessed/performed      Past Medical History  Diagnosis Date  . Type 2 diabetes mellitus with autonomic neuropathy   . Hyperlipidemia   . Hypertension   . Arthritis   . Diverticulitis   . Parkinsonism   . Right knee pain   . Memory loss   . Depression   . Gait disturbance   . Restless leg   . Thyroid disease   . Vitamin D deficiency   . Osteoarthritis   . Osteoporosis   . Dysphagia, idiopathic     History of aspiration  . Recurrent falls     Using a cane and walker  . Dizzy   . Hearing loss of both ears   . Neck pain   . Thoracic back pain   . Lumbar back pain   . Shoulder pain   . Cyanocobalamin deficiency   . Other and unspecified hyperlipidemia   . Unspecified essential hypertension   . Abnormality of gait   . Unspecified hypothyroidism   . Restless legs syndrome (RLS)     Past Surgical History  Procedure Laterality Date  . Cataract extraction Bilateral 2012    Dr. Hazle Quant    There were no vitals taken for this visit.  Visit Diagnosis:  Abnormality of gait  Weakness generalized      Subjective Assessment - 03/12/14 1548    Symptoms Reports he only did sit to stand exercise because his knee hurt.   Currently in Pain? Yes   Pain Score  8    Pain Location Knee   Pain Orientation Right   Pain Descriptors / Indicators Aching   Pain Type Chronic pain   Pain Onset More than a month ago   Pain Frequency Intermittent   Multiple Pain Sites No                    [facility] Adult PT Treatment/Exercise - 03/12/14 1445    Transfers   Sit to Stand 5: Supervision;With upper extremity assist;With armrests;From chair/3-in-1   Sit to Stand Details (indicate cue type and reason) verbal cues on technique & wt shift   Stand to Sit 5: Supervision;With upper extremity assist;With armrests;To chair/3-in-1   Stand to Sit Details verbal cues to keep wt over feet while lowering wt initially   Knee/Hip Exercises: Aerobic   Stationary Bike NuStep Level 3 with Bilateral UEs & LEs X 8 minutes  Cued on full ROM & pace                PT Education - 03/12/14 1539    Education provided Yes   Education Details reviewed sit to stand & POWER stepping: PT switched to bilateral UE support to decrease  knee arthritis pain, PT added seated hamstring & heelcord stretch.   Person(s) Educated Patient;Spouse   Methods Explanation;Demonstration;Handout;Tactile cues   Comprehension Verbalized understanding;Returned demonstration;Need further instruction          PT Short Term Goals - 02/28/14 1708    PT SHORT TERM GOAL #1   Title Pt will be independent in HEP to improve funcitonal mobility. Target date: 03/28/14.   Status New   PT SHORT TERM GOAL #2   Title Assess BERG and write STG/LTGs if appropriate. Target date: 03/28/14.   Status New   PT SHORT TERM GOAL #3   Title Pt will report no falls in the last 2 weeks to improve safety during mobility. Target date: 03/28/14.   Status New   PT SHORT TERM GOAL #4   Title Pt will perform TUG, with LRAD, in <19 second to reduce falls risk. Target date: 03/28/14.   Status New   PT SHORT TERM GOAL #5   Title Pt will ambulate 200' with LRAD over even/uneven surface with supervision to improve functional  mobility. Target date: 03/28/14.   Status New           PT Long Term Goals - 02/28/14 1711    PT LONG TERM GOAL #1   Title Pt will verbalize understanding and agreement of fall prevention strategies to reduce falls risk. Target date: 04/25/14.   Status New   PT LONG TERM GOAL #2   Title Pt will perform TUG, with LRAD, in <16 second to reduce falls risk. Target date: 04/25/14.   Status New   PT LONG TERM GOAL #3   Title Pt will ambulate 350' with LRAD over even/uneven surface at MOD I level to improve functional mobility. Target date: 04/25/14.   Status New   PT LONG TERM GOAL #4   Title Pt will ambulate >/=2.304ft/sec., with LRAD, to improve functional mobility. Target date: 04/25/14.   Status New               Plan - 03/12/14 1445    Clinical Impression Statement Patient's right knee was painful so he limited exercises. PT explained balancing too little vs too much activity for better pain management. PT decreased standing exercise intensity by using bilateral UE support and appeared to cause less pain.   Pt will benefit from skilled therapeutic intervention in order to improve on the following deficits Difficulty walking;Abnormal gait;Decreased endurance;Decreased safety awareness;Decreased knowledge of use of DME;Decreased balance;Decreased strength;Decreased mobility   Rehab Potential Fair   PT Frequency 2x / week   PT Duration 8 weeks   PT Treatment/Interventions ADLs/Self Care Home Management;Gait training;Neuromuscular re-education;Stair training;Functional mobility training;Patient/family education;Biofeedback;Therapeutic activities;Therapeutic exercise;Manual techniques;Electrical Stimulation;DME Instruction;Balance training   PT Next Visit Plan check HEP; cont gait and balance   Consulted and Agree with Plan of Care Patient;Family member/caregiver        Problem List Patient Active Problem List   Diagnosis Date Noted  . Insomnia 10/17/2013  . Cellulitis, scrotum  10/17/2013  . Pain in joint, shoulder region 01/25/2013  . Dysphagia, idiopathic   . Recurrent falls   . Dizzy   . Hearing loss of both ears   . Neck pain   . Right knee pain   . Thoracic back pain   . Lumbar back pain   . Shoulder pain   . Cyanocobalamin deficiency   . Type 2 diabetes mellitus with autonomic neuropathy   . Restless legs syndrome (RLS)   . Parkinson's plus syndrome 11/24/2012  .  Gait difficulty 11/24/2012  . NOCTURIA 05/08/2009  . SOMNOLENCE 04/11/2009  . MEMORY LOSS 04/11/2009  . ECZEMA 03/21/2009  . HYPERTROPHY PROSTATE W/UR OBST & OTH LUTS 12/25/2008  . OSTEOARTHRITIS, KNEE, RIGHT 12/25/2008  . OSTEOPOROSIS 12/25/2008  . URINARY URGENCY 12/25/2008  . VITAMIN D DEFICIENCY 12/22/2007  . DIVERTICULITIS, HX OF 06/01/2007  . HYPOTHYROIDISM 12/16/2006  . HYPERLIPIDEMIA 12/16/2006  . ERECTILE DYSFUNCTION 12/16/2006  . HYPERTENSION 12/16/2006    Madelyn Tlatelpa PT, DPT 03/12/2014, 3:50 PM  [facility] [address] [place], [place], [address] Phone: [phone]   Fax:  [phone]

## 2014-03-14 ENCOUNTER — Ambulatory Visit: Payer: PPO | Admitting: Physical Therapy

## 2014-03-14 ENCOUNTER — Encounter: Payer: Self-pay | Admitting: Physical Therapy

## 2014-03-14 DIAGNOSIS — R531 Weakness: Secondary | ICD-10-CM

## 2014-03-14 DIAGNOSIS — R490 Dysphonia: Secondary | ICD-10-CM | POA: Diagnosis not present

## 2014-03-14 DIAGNOSIS — R269 Unspecified abnormalities of gait and mobility: Secondary | ICD-10-CM

## 2014-03-14 NOTE — Therapy (Signed)
[facility] [address] [place], [place], [address] Phone: [phone]   Fax:  [phone]  Physical Therapy Treatment  Patient Details  Name: Nicholas Barnett MRN: [id number] Date of Birth: 1931/11/20 Referring Provider:  Kimber Relic, MD  Encounter Date: 03/14/2014      PT End of Session - 03/14/14 1445    Visit Number 4  G2   Number of Visits 17   Date for PT Re-Evaluation 04/29/14   Authorization Type G-code every 10th visit.   PT Start Time 1445   PT Stop Time 1530   PT Time Calculation (min) 45 min   Equipment Utilized During Treatment Gait belt   Activity Tolerance Patient tolerated treatment well   Behavior During Therapy WFL for tasks assessed/performed      Past Medical History  Diagnosis Date  . Type 2 diabetes mellitus with autonomic neuropathy   . Hyperlipidemia   . Hypertension   . Arthritis   . Diverticulitis   . Parkinsonism   . Right knee pain   . Memory loss   . Depression   . Gait disturbance   . Restless leg   . Thyroid disease   . Vitamin D deficiency   . Osteoarthritis   . Osteoporosis   . Dysphagia, idiopathic     History of aspiration  . Recurrent falls     Using a cane and walker  . Dizzy   . Hearing loss of both ears   . Neck pain   . Thoracic back pain   . Lumbar back pain   . Shoulder pain   . Cyanocobalamin deficiency   . Other and unspecified hyperlipidemia   . Unspecified essential hypertension   . Abnormality of gait   . Unspecified hypothyroidism   . Restless legs syndrome (RLS)     Past Surgical History  Procedure Laterality Date  . Cataract extraction Bilateral 2012    Dr. Hazle Quant    There were no vitals taken for this visit.  Visit Diagnosis:  Abnormality of gait  Weakness generalized  Hypokinetic Parkinsonian dysphonia  Gait difficulty      Subjective Assessment - 03/14/14 1513    Symptoms Right shoulder pain and knee pain are constant  symptoms; wife and husband state that the ex's do help and he's moving around the home better; levadopa has helped a lot;    Currently in Pain? Yes   Pain Score 8    Pain Location Shoulder   Pain Orientation Right   Pain Descriptors / Indicators Sharp   Pain Type Chronic pain   Pain Onset More than a month ago   Pain Frequency Intermittent   Multiple Pain Sites Yes   Pain Score 6   Pain Type Chronic pain   Pain Location Knee   Pain Orientation Right   Pain Descriptors / Indicators Sharp       Therapeutic exercise   Active standing x 2    bouts; with gentle trunk rotation Tried; lat pull down and chest press with 25 pounds 2 x 10  Sci Fit ; 15 minutes Standing calf stretch on stairs ; 5 x 30 seconds                     PT Education - 03/14/14 1522    Education provided Yes   Education Details posture and HEP   Person(s) Educated Patient;Spouse   Methods Demonstration   Comprehension Verbalized understanding  PT Short Term Goals - 02/28/14 1708    PT SHORT TERM GOAL #1   Title Pt will be independent in HEP to improve funcitonal mobility. Target date: 03/28/14.   Status New   PT SHORT TERM GOAL #2   Title Assess BERG and write STG/LTGs if appropriate. Target date: 03/28/14.   Status New   PT SHORT TERM GOAL #3   Title Pt will report no falls in the last 2 weeks to improve safety during mobility. Target date: 03/28/14.   Status New   PT SHORT TERM GOAL #4   Title Pt will perform TUG, with LRAD, in <19 second to reduce falls risk. Target date: 03/28/14.   Status New   PT SHORT TERM GOAL #5   Title Pt will ambulate 200' with LRAD over even/uneven surface with supervision to improve functional mobility. Target date: 03/28/14.   Status New           PT Long Term Goals - 02/28/14 1711    PT LONG TERM GOAL #1   Title Pt will verbalize understanding and agreement of fall prevention strategies to reduce falls risk. Target date: 04/25/14.   Status  New   PT LONG TERM GOAL #2   Title Pt will perform TUG, with LRAD, in <16 second to reduce falls risk. Target date: 04/25/14.   Status New   PT LONG TERM GOAL #3   Title Pt will ambulate 350' with LRAD over even/uneven surface at MOD I level to improve functional mobility. Target date: 04/25/14.   Status New   PT LONG TERM GOAL #4   Title Pt will ambulate >/=2.744ft/sec., with LRAD, to improve functional mobility. Target date: 04/25/14.   Status New               Plan - 03/14/14 1523    Clinical Impression Statement Right knee and shoulder pain limits his activity; after sci fit he had increase in shoulder and knee pain but he tolerlated it ; he is able to make significant improvement in posture when standing with some feedback   PT Next Visit Plan assess ther ex tolerance; cont with developing HEP        Problem List Patient Active Problem List   Diagnosis Date Noted  . Insomnia 10/17/2013  . Cellulitis, scrotum 10/17/2013  . Pain in joint, shoulder region 01/25/2013  . Dysphagia, idiopathic   . Recurrent falls   . Dizzy   . Hearing loss of both ears   . Neck pain   . Right knee pain   . Thoracic back pain   . Lumbar back pain   . Shoulder pain   . Cyanocobalamin deficiency   . Type 2 diabetes mellitus with autonomic neuropathy   . Restless legs syndrome (RLS)   . Parkinson's plus syndrome 11/24/2012  . Gait difficulty 11/24/2012  . NOCTURIA 05/08/2009  . SOMNOLENCE 04/11/2009  . MEMORY LOSS 04/11/2009  . ECZEMA 03/21/2009  . HYPERTROPHY PROSTATE W/UR OBST & OTH LUTS 12/25/2008  . OSTEOARTHRITIS, KNEE, RIGHT 12/25/2008  . OSTEOPOROSIS 12/25/2008  . URINARY URGENCY 12/25/2008  . VITAMIN D DEFICIENCY 12/22/2007  . DIVERTICULITIS, HX OF 06/01/2007  . HYPOTHYROIDISM 12/16/2006  . HYPERLIPIDEMIA 12/16/2006  . ERECTILE DYSFUNCTION 12/16/2006  . HYPERTENSION 12/16/2006    Cecilie KicksShoup, Jeffrey D 03/14/2014, 3:46 PM  [facility] [address] [place], [place], [address] Phone: [phone]   Fax:  [phone]

## 2014-03-14 NOTE — Patient Instructions (Signed)
Posture re-ed; standing erect; using wall for feed back;  Recommended (with wife) that she encourage pt to get up during commercials throughout the day with arm/leg and transfer ex's

## 2014-03-19 ENCOUNTER — Other Ambulatory Visit: Payer: Self-pay | Admitting: *Deleted

## 2014-03-19 ENCOUNTER — Ambulatory Visit: Payer: PPO

## 2014-03-19 MED ORDER — GLUCOSE BLOOD VI STRP: ORAL_STRIP | Status: DC

## 2014-03-19 MED ORDER — PRODIGY LANCETS 21G MISC: Status: DC

## 2014-03-19 MED ORDER — FREESTYLE LITE DEVI: Status: DC

## 2014-03-19 MED ORDER — FREESTYLE LANCETS MISC: Status: DC

## 2014-03-19 NOTE — Telephone Encounter (Signed)
Wife called and stated that insurance will no longer cover the blood machine prescribed. I called pharmacy and they stated that he needed the Freestyle Lite. Faxed Rxs to pharmacy. Patient wife Notified.

## 2014-03-19 NOTE — Telephone Encounter (Signed)
Patient wife called and stated Nicholas Barnett does not participate with new insurance and they need a Rx faxed to pharmacy for lancets and strips. Faxed to pharmacy.

## 2014-03-21 ENCOUNTER — Ambulatory Visit: Payer: PPO

## 2014-03-21 DIAGNOSIS — R269 Unspecified abnormalities of gait and mobility: Secondary | ICD-10-CM

## 2014-03-21 DIAGNOSIS — R490 Dysphonia: Secondary | ICD-10-CM | POA: Diagnosis not present

## 2014-03-21 DIAGNOSIS — R531 Weakness: Secondary | ICD-10-CM

## 2014-03-21 NOTE — Therapy (Signed)
[facility] [address] [place], [place], [address] Phone: [phone]   Fax:  [phone]  Physical Therapy Treatment  Patient Details  Name: Nicholas HalterDavid L Clover MRN: [id number] Date of Birth: [birth year] Referring Provider:  Kimber RelicGreen, Arthur G, MD  Encounter Date: 03/21/2014      PT End of Session - 03/21/14 1508    Visit Number 5   Number of Visits 17   Date for PT Re-Evaluation 04/29/14   Authorization Type G-code every 10th visit.   PT Start Time 1401   PT Stop Time 1445   PT Time Calculation (min) 44 min   Equipment Utilized During Treatment Gait belt   Activity Tolerance Patient tolerated treatment well   Behavior During Therapy WFL for tasks assessed/performed      Past Medical History  Diagnosis Date  . Type 2 diabetes mellitus with autonomic neuropathy   . Hyperlipidemia   . Hypertension   . Arthritis   . Diverticulitis   . Parkinsonism   . Right knee pain   . Memory loss   . Depression   . Gait disturbance   . Restless leg   . Thyroid disease   . Vitamin D deficiency   . Osteoarthritis   . Osteoporosis   . Dysphagia, idiopathic     History of aspiration  . Recurrent falls     Using a cane and walker  . Dizzy   . Hearing loss of both ears   . Neck pain   . Thoracic back pain   . Lumbar back pain   . Shoulder pain   . Cyanocobalamin deficiency   . Other and unspecified hyperlipidemia   . Unspecified essential hypertension   . Abnormality of gait   . Unspecified hypothyroidism   . Restless legs syndrome (RLS)     Past Surgical History  Procedure Laterality Date  . Cataract extraction Bilateral 2012    Dr. Hazle Quantigby    There were no vitals taken for this visit.  Visit Diagnosis:  Gait difficulty  Abnormality of gait  Weakness generalized      Subjective Assessment - 03/21/14 1407    Symptoms Pt reported R shoulder and knee pain after last session.   Pertinent History Parkinsonism,  diabetes, neuropathy, B shoulder bursitis, memory loss   Patient Stated Goals be more independent, walk better, stop falling   Currently in Pain? Yes   Pain Score 5    Pain Location Knee   Pain Orientation Right   Pain Descriptors / Indicators Aching   Pain Type Chronic pain   Pain Onset More than a month ago   Pain Frequency Constant   Aggravating Factors  machines (resistance)   Pain Relieving Factors rest   Pain Score 8   Pain Type Chronic pain   Pain Location Shoulder   Pain Orientation Right   Pain Descriptors / Indicators Tender;Sharp   Pain Frequency Constant   Pain Onset Other (Comment)  pain worsened after using machine at PT session.                    [facility] Adult PT Treatment/Exercise - 03/21/14 1416    Ambulation/Gait   Ambulation/Gait Yes   Ambulation/Gait Assistance 5: Supervision;4: Min guard   Ambulation/Gait Assistance Details Pt ambulated over even terrain. Pt performed B hip marches with one hand on counter for support (4x7'), VC's for technique and upright posture. Backward steps x15/LE, with one hand on counter, VC's to improve step  length and posture. VC's for sequencing and safety with SPC, VC's for upright posture with u-step rollator. Pt reported he does not care for walkers. PT noted pt to ambulate in safer manner, with less postural sway when ambulating with u-step walker.   Ambulation Distance (Feet) --  75', 50'x2, 117'   Assistive device Straight cane;Other (Comment)  U-step walker   Gait Pattern Step-through pattern;Decreased stride length;Decreased dorsiflexion - right;Trunk flexed  occasional freezing episodes with SPC over carpet   Ambulation Surface Level;Indoor   Balance   Balance Assessed Yes   Static Standing Balance   Static Standing - Balance Support No upper extremity supported;Right upper extremity supported  1 UE support during single leg stance   Static Standing - Level of Assistance 5: Stand by assistance;Other (comment)   min guard   Static Standing - Comment/# of Minutes Pt performed balance exercises in corner with chair in front of pt, with B LEs, 1-3 sets with 10-30 second holds: feet together/apart with eyes open/closed, feet together/apart with and without head turns, modified tandem stance, single leg stance. VC's for technique and upright posture.                PT Education - 03/21/14 1507    Education provided Yes   Education Details PT educated pt on icing knee/shoulder to reduce pain (10-15 minute bouts). PT educated pt on using walker for safety vs. cane. PT educated pt on the importance of performing HEP at home, on days he does not have PT. PT did not add additonal HEP, as pt is not consistently performing current HEP.   Person(s) Educated Patient;Spouse   Methods Explanation   Comprehension Verbalized understanding          PT Short Term Goals - 03/21/14 1511    PT SHORT TERM GOAL #1   Title Pt will be independent in HEP to improve funcitonal mobility. Target date: 03/28/14.   Status On-going   PT SHORT TERM GOAL #2   Title Assess BERG and write STG/LTGs if appropriate. Target date: 03/28/14.   Status Achieved   PT SHORT TERM GOAL #3   Title Pt will report no falls in the last 2 weeks to improve safety during mobility. Target date: 03/28/14.   Status On-going   PT SHORT TERM GOAL #4   Title Pt will perform TUG, with LRAD, in <19 second to reduce falls risk. Target date: 03/28/14.   Status On-going   PT SHORT TERM GOAL #5   Title Pt will ambulate 200' with LRAD over even/uneven surface with supervision to improve functional mobility. Target date: 03/28/14.   Status On-going   Additional Short Term Goals   Additional Short Term Goals Yes   PT SHORT TERM GOAL #6   Title Pt will improve BERG balance score to >/=41/56 to decrease falls risk. Target date: 03/28/14.   Status New           PT Long Term Goals - 03/21/14 1512    PT LONG TERM GOAL #1   Title Pt will verbalize  understanding and agreement of fall prevention strategies to reduce falls risk. Target date: 04/25/14.   Status On-going   PT LONG TERM GOAL #2   Title Pt will perform TUG, with LRAD, in <16 second to reduce falls risk. Target date: 04/25/14.   Status On-going   PT LONG TERM GOAL #3   Title Pt will ambulate 350' with LRAD over even/uneven surface at MOD I level to improve functional  mobility. Target date: 04/25/14.   Status On-going   PT LONG TERM GOAL #4   Title Pt will ambulate >/=2.71ft/sec., with LRAD, to improve functional mobility. Target date: 04/25/14.   Status On-going   PT LONG TERM GOAL #5   Title Pt will improve BERG balance score to >/=44/56 to decrease falls risk. Target date: 04/25/14.   Status New               Plan - 03/21/14 1508    Clinical Impression Statement Pt limited by knee pain, as he required seated rest breaks during session due to pain. Pt demonstrated progress, as he was able to perform balance and gait activites with less assist and cues. Pt experienced increased postural sway during balance activites with eyes closed, indicating decreased vestibular function. Pt would continue to benefit from skilled PT to improve safety during functional mobility.    Pt will benefit from skilled therapeutic intervention in order to improve on the following deficits Difficulty walking;Abnormal gait;Decreased endurance;Decreased safety awareness;Decreased knowledge of use of DME;Decreased balance;Decreased strength;Decreased mobility   Rehab Potential Fair   PT Frequency 2x / week   PT Duration 8 weeks   PT Treatment/Interventions ADLs/Self Care Home Management;Gait training;Neuromuscular re-education;Stair training;Functional mobility training;Patient/family education;Biofeedback;Therapeutic activities;Therapeutic exercise;Manual techniques;Electrical Stimulation;DME Instruction;Balance training   PT Next Visit Plan Continue to progress gait and balance training, progress HEP as  tolerated. Try U-step walker again when laser is working.   Consulted and Agree with Plan of Care Patient;Family member/caregiver        Problem List Patient Active Problem List   Diagnosis Date Noted  . Insomnia 10/17/2013  . Cellulitis, scrotum 10/17/2013  . Pain in joint, shoulder region 01/25/2013  . Dysphagia, idiopathic   . Recurrent falls   . Dizzy   . Hearing loss of both ears   . Neck pain   . Right knee pain   . Thoracic back pain   . Lumbar back pain   . Shoulder pain   . Cyanocobalamin deficiency   . Type 2 diabetes mellitus with autonomic neuropathy   . Restless legs syndrome (RLS)   . Parkinson's plus syndrome 11/24/2012  . Gait difficulty 11/24/2012  . NOCTURIA 05/08/2009  . SOMNOLENCE 04/11/2009  . MEMORY LOSS 04/11/2009  . ECZEMA 03/21/2009  . HYPERTROPHY PROSTATE W/UR OBST & OTH LUTS 12/25/2008  . OSTEOARTHRITIS, KNEE, RIGHT 12/25/2008  . OSTEOPOROSIS 12/25/2008  . URINARY URGENCY 12/25/2008  . VITAMIN D DEFICIENCY 12/22/2007  . DIVERTICULITIS, HX OF 06/01/2007  . HYPOTHYROIDISM 12/16/2006  . HYPERLIPIDEMIA 12/16/2006  . ERECTILE DYSFUNCTION 12/16/2006  . HYPERTENSION 12/16/2006    Miller,Jennifer L 03/21/2014, 3:14 PM  [facility] [facility] [address] [place], [place], [address] Phone: [phone]   Fax:  [phone]     Zerita Boers, PT,DPT 03/21/2014 3:15 PM Phone: [phone] Fax: [phone]

## 2014-03-22 ENCOUNTER — Telehealth: Payer: Self-pay

## 2014-03-22 LAB — HM DIABETES EYE EXAM

## 2014-03-22 NOTE — Telephone Encounter (Signed)
PT left message for pt to reschedule 03/26/14 (2:00pm) PT appt., due to provider not here in that afternoon. PT has appointments on 2/2 or 2/4 available.

## 2014-03-26 ENCOUNTER — Ambulatory Visit: Payer: PPO

## 2014-03-28 ENCOUNTER — Ambulatory Visit: Payer: PPO | Attending: Orthopedic Surgery

## 2014-03-28 DIAGNOSIS — R1312 Dysphagia, oropharyngeal phase: Secondary | ICD-10-CM | POA: Insufficient documentation

## 2014-03-28 DIAGNOSIS — R269 Unspecified abnormalities of gait and mobility: Secondary | ICD-10-CM | POA: Diagnosis not present

## 2014-03-28 DIAGNOSIS — R531 Weakness: Secondary | ICD-10-CM | POA: Diagnosis not present

## 2014-03-28 DIAGNOSIS — R490 Dysphonia: Secondary | ICD-10-CM | POA: Insufficient documentation

## 2014-03-28 NOTE — Therapy (Signed)
[facility] [address] [place], [place], [address] Phone: [phone]   Fax:  [phone]  Physical Therapy Treatment  Patient Details  Name: Nicholas Barnett MRN: [id number] Date of Birth: 03-Dec-1931 Referring Provider:  Estill Dooms, MD  Encounter Date: 03/28/2014      PT End of Session - 03/28/14 1927    Visit Number 6   Number of Visits 17   Date for PT Re-Evaluation 04/29/14   Authorization Type G-code every 10th visit.   PT Start Time 1401   PT Stop Time 1449   PT Time Calculation (min) 48 min   Equipment Utilized During Treatment Gait belt   Activity Tolerance Patient tolerated treatment well   Behavior During Therapy WFL for tasks assessed/performed      Past Medical History  Diagnosis Date  . Type 2 diabetes mellitus with autonomic neuropathy   . Hyperlipidemia   . Hypertension   . Arthritis   . Diverticulitis   . Parkinsonism   . Right knee pain   . Memory loss   . Depression   . Gait disturbance   . Restless leg   . Thyroid disease   . Vitamin D deficiency   . Osteoarthritis   . Osteoporosis   . Dysphagia, idiopathic     History of aspiration  . Recurrent falls     Using a cane and walker  . Dizzy   . Hearing loss of both ears   . Neck pain   . Thoracic back pain   . Lumbar back pain   . Shoulder pain   . Cyanocobalamin deficiency   . Other and unspecified hyperlipidemia   . Unspecified essential hypertension   . Abnormality of gait   . Unspecified hypothyroidism   . Restless legs syndrome (RLS)     Past Surgical History  Procedure Laterality Date  . Cataract extraction Bilateral 2012    Dr. Bing Plume    There were no vitals taken for this visit.  Visit Diagnosis:  Gait difficulty  Abnormality of gait  Weakness generalized      Subjective Assessment - 03/28/14 1405    Symptoms Pt denied falls or changes since last session. "My knee has been giving me a fit." Pt's wife  reported she no longer needs to assist pt OOB or during car transfers, she believes this is due to the medication that pt is taking.   Pertinent History Parkinsonism, diabetes, neuropathy, B shoulder bursitis, memory loss   Patient Stated Goals be more independent, walk better, stop falling   Currently in Pain? No/denies   Pain Score 8    Pain Location Knee   Pain Orientation Right   Pain Descriptors / Indicators Aching   Pain Type Chronic pain   Pain Frequency Constant   Aggravating Factors  walking   Pain Relieving Factors rest                    [facility] Adult PT Treatment/Exercise - 03/28/14 1411    Ambulation/Gait   Ambulation/Gait Yes   Ambulation/Gait Assistance 5: Supervision;4: Min guard   Ambulation/Gait Assistance Details Pt ambulated over even terrain, with no LOB. Pt required min guard during last 30' (during 117' walk) due to difficulty maintaing upright posture due to fatigue. Cues to improve posture, stride length, and sequencing during ambulation with SPC.   Ambulation Distance (Feet) --  117', 75'x2   Assistive device Straight cane;None   Gait Pattern Step-through pattern;Decreased stride  length;Decreased dorsiflexion - right;Trunk flexed   Ambulation Surface Level;Indoor   Standardized Balance Assessment   Standardized Balance Assessment Berg Balance Test;Timed Up and Go Test   Berg Balance Test   Sit to Stand Able to stand without using hands and stabilize independently   Standing Unsupported Able to stand safely 2 minutes   Sitting with Back Unsupported but Feet Supported on Floor or Stool Able to sit safely and securely 2 minutes   Stand to Sit Sits safely with minimal use of hands   Transfers Able to transfer safely, definite need of hands   Standing Unsupported with Eyes Closed Able to stand 10 seconds safely   Standing Ubsupported with Feet Together Able to place feet together independently and stand for 1 minute with supervision   From Standing,  Reach Forward with Outstretched Arm Can reach forward >12 cm safely (5")  8"   From Standing Position, Pick up Object from [place] to pick up shoe safely and easily   From Standing Position, Turn to Look Behind Over each Shoulder Looks behind one side only/other side shows less weight shift   Turn 360 Degrees Able to turn 360 degrees safely but slowly   Standing Unsupported, Alternately Place Feet on Step/Stool Able to complete 4 steps without aid or supervision   Standing Unsupported, One Foot in Front Able to plae foot ahead of the other independently and hold 30 seconds   Standing on One Leg Able to lift leg independently and hold 5-10 seconds  5.5 seconds   Total Score 46   Timed Up and Go Test   TUG Normal TUG   Normal TUG (seconds) --  14.7 seconds without SPC, 14.65 seconds with [facility]                PT Education - 03/28/14 1926    Education provided Yes   Education Details PT educated pt on the importance of continuing HEP and PT, as he is demonstrating progress.   Person(s) Educated Patient;Spouse   Methods Explanation   Comprehension Verbalized understanding          PT Short Term Goals - 03/28/14 1932    PT SHORT TERM GOAL #1   Title Pt will be independent in HEP to improve funcitonal mobility. Target date: 03/28/14.   Status On-going   PT SHORT TERM GOAL #2   Title Assess BERG and write STG/LTGs if appropriate. Target date: 03/28/14.   Status Achieved   PT SHORT TERM GOAL #3   Title Pt will report no falls in the last 2 weeks to improve safety during mobility. Target date: 03/28/14.   Status Achieved   PT SHORT TERM GOAL #4   Title Pt will perform TUG, with LRAD, in <19 second to reduce falls risk. Target date: 03/28/14.   Status Achieved   PT SHORT TERM GOAL #5   Title Pt will ambulate 200' with LRAD over even/uneven surface with supervision to improve functional mobility. Target date: 03/28/14.   Baseline 117' on 03/28/14.   Status Not Met   PT SHORT TERM GOAL  #6   Title Pt will improve BERG balance score to >/=41/56 to decrease falls risk. Target date: 03/28/14.   Status Achieved           PT Long Term Goals - 03/28/14 1932    PT LONG TERM GOAL #1   Title Pt will verbalize understanding and agreement of fall prevention strategies to reduce falls risk. Target date: 04/25/14.  Status On-going   PT LONG TERM GOAL #2   Title Pt will perform TUG, with LRAD, in <16 second to reduce falls risk. Target date: 04/25/14.   Status On-going   PT LONG TERM GOAL #3   Title Pt will ambulate 350' with LRAD over even/uneven surface at MOD I level to improve functional mobility. Target date: 04/25/14.   Status On-going   PT LONG TERM GOAL #4   Title Pt will ambulate >/=2.26f/sec., with LRAD, to improve functional mobility. Target date: 04/25/14.   Status On-going   PT LONG TERM GOAL #5   Title Pt will improve BERG balance score to >/=50/56 to decrease falls risk. Target date: 04/25/14.   Baseline Revised on 03/28/14 as pt scored 46/56 on 03/28/14.   Status Revised               Plan - 03/28/14 1927    Clinical Impression Statement Pt demonstrated progress, as he met STGs 3, 4, 5, and 6. Pt did not meet ambulation STG #5. PT will assess STG 1 (HEP) next session and progress as tolerated. Pt's BERG score (46/56) still indicates pt is at significant risk of falls and would continue to benefit from skilled PT to improve safety during functional moblility.   Pt will benefit from skilled therapeutic intervention in order to improve on the following deficits Difficulty walking;Abnormal gait;Decreased endurance;Decreased safety awareness;Decreased knowledge of use of DME;Decreased balance;Decreased strength;Decreased mobility   Rehab Potential Fair   PT Frequency 2x / week   PT Duration 8 weeks   PT Treatment/Interventions ADLs/Self Care Home Management;Gait training;Neuromuscular re-education;Stair training;Functional mobility training;Patient/family  education;Biofeedback;Therapeutic activities;Therapeutic exercise;Manual techniques;Electrical Stimulation;DME Instruction;Balance training   PT Next Visit Plan Assess STG 1 (HEP), continue to progress gait training without AD.   Consulted and Agree with Plan of Care Patient;Family member/caregiver        Problem List Patient Active Problem List   Diagnosis Date Noted  . Insomnia 10/17/2013  . Cellulitis, scrotum 10/17/2013  . Pain in joint, shoulder region 01/25/2013  . Dysphagia, idiopathic   . Recurrent falls   . Dizzy   . Hearing loss of both ears   . Neck pain   . Right knee pain   . Thoracic back pain   . Lumbar back pain   . Shoulder pain   . Cyanocobalamin deficiency   . Type 2 diabetes mellitus with autonomic neuropathy   . Restless legs syndrome (RLS)   . Parkinson's plus syndrome 11/24/2012  . Gait difficulty 11/24/2012  . NOCTURIA 05/08/2009  . SOMNOLENCE 04/11/2009  . MEMORY LOSS 04/11/2009  . ECZEMA 03/21/2009  . HYPERTROPHY PROSTATE W/UR OBST & OTH LUTS 12/25/2008  . OSTEOARTHRITIS, KNEE, RIGHT 12/25/2008  . OSTEOPOROSIS 12/25/2008  . URINARY URGENCY 12/25/2008  . VITAMIN D DEFICIENCY 12/22/2007  . DIVERTICULITIS, HX OF 06/01/2007  . HYPOTHYROIDISM 12/16/2006  . HYPERLIPIDEMIA 12/16/2006  . ERECTILE DYSFUNCTION 12/16/2006  . HYPERTENSION 12/16/2006    Miller,Jennifer L 03/28/2014, 7:36 PM  [place] [place] [address]Phone: [phone]  Fax:  [phone]    JGeoffry Paradise PT,DPT 03/28/2014 7:36 PM Phone: [phone]: [phone]

## 2014-03-29 ENCOUNTER — Encounter: Payer: Self-pay | Admitting: *Deleted

## 2014-04-10 NOTE — Telephone Encounter (Signed)
error 

## 2014-04-12 NOTE — Therapy (Signed)
[facility] [address] [place], [place], [address] Phone: [phone]   Fax:  [phone]  Patient Details  Name: Nicholas Barnett MRN: [id number] Date of Birth: [birth year] Referring Provider:  No ref. provider found  Encounter Date: 04/12/2014  PHYSICAL THERAPY DISCHARGE SUMMARY  Visits from Start of Care: 6  Current functional level related to goals / functional outcomes:     PT Short Term Goals - 03/28/14 1932    PT SHORT TERM GOAL #1   Title Pt will be independent in HEP to improve funcitonal mobility. Target date: 03/28/14.   Status On-going   PT SHORT TERM GOAL #2   Title Assess BERG and write STG/LTGs if appropriate. Target date: 03/28/14.   Status Achieved   PT SHORT TERM GOAL #3   Title Pt will report no falls in the last 2 weeks to improve safety during mobility. Target date: 03/28/14.   Status Achieved   PT SHORT TERM GOAL #4   Title Pt will perform TUG, with LRAD, in <19 second to reduce falls risk. Target date: 03/28/14.   Status Achieved   PT SHORT TERM GOAL #5   Title Pt will ambulate 200' with LRAD over even/uneven surface with supervision to improve functional mobility. Target date: 03/28/14.   Baseline 117' on 03/28/14.   Status Not Met   PT SHORT TERM GOAL #6   Title Pt will improve BERG balance score to >/=41/56 to decrease falls risk. Target date: 03/28/14.   Status Achieved          PT Long Term Goals - 03/28/14 1932    PT LONG TERM GOAL #1   Title Pt will verbalize understanding and agreement of fall prevention strategies to reduce falls risk. Target date: 04/25/14.   Status On-going   PT LONG TERM GOAL #2   Title Pt will perform TUG, with LRAD, in <16 second to reduce falls risk. Target date: 04/25/14.   Status On-going   PT LONG TERM GOAL #3   Title Pt will ambulate 350' with LRAD over even/uneven surface at MOD I level to improve functional mobility. Target date: 04/25/14.   Status On-going   PT  LONG TERM GOAL #4   Title Pt will ambulate >/=2.36f/sec., with LRAD, to improve functional mobility. Target date: 04/25/14.   Status On-going   PT LONG TERM GOAL #5   Title Pt will improve BERG balance score to >/=50/56 to decrease falls risk. Target date: 04/25/14.   Baseline Revised on 03/28/14 as pt scored 46/56 on 03/28/14.   Status Revised        Remaining deficits: Unknown, as pt did not come back to PT. Pt's wife called and stated pt did not wish to schedule additional visits on 03/28/14, and that they would call back if he changed his mind. PT is now discharging pt, as they did not call back.   Education / Equipment: HEP, educated pt on different types of walkers (U-step rollator)  Plan: Patient agrees to discharge.  Patient goals were partially met. Patient is being discharged due to not returning since the last visit.  ?????      Brantley Naser L 04/12/2014, 1:30 PM  [place] [place] [address]Phone: [phone]  Fax:  [phone]   JGeoffry Paradise PT,DPT 04/12/2014 1:33 PM Phone: [phone]: [phone]

## 2014-04-16 ENCOUNTER — Other Ambulatory Visit: Payer: PPO

## 2014-04-16 DIAGNOSIS — E538 Deficiency of other specified B group vitamins: Secondary | ICD-10-CM

## 2014-04-16 DIAGNOSIS — I10 Essential (primary) hypertension: Secondary | ICD-10-CM

## 2014-04-16 DIAGNOSIS — E785 Hyperlipidemia, unspecified: Secondary | ICD-10-CM

## 2014-04-17 LAB — COMPREHENSIVE METABOLIC PANEL
ALT: 11 IU/L (ref 0–44)
AST: 13 IU/L (ref 0–40)
Albumin/Globulin Ratio: 1.8 (ref 1.1–2.5)
Albumin: 4.2 g/dL (ref 3.5–4.7)
Alkaline Phosphatase: 92 IU/L (ref 39–117)
BUN/Creatinine Ratio: 26 — ABNORMAL HIGH (ref 10–22)
BUN: 26 mg/dL (ref 8–27)
Bilirubin Total: 0.4 mg/dL (ref 0.0–1.2)
CALCIUM: 10 mg/dL (ref 8.6–10.2)
CHLORIDE: 100 mmol/L (ref 97–108)
CO2: 25 mmol/L (ref 18–29)
Creatinine, Ser: 1.01 mg/dL (ref 0.76–1.27)
GFR calc Af Amer: 81 mL/min/{1.73_m2} (ref >59)
GFR, EST NON AFRICAN AMERICAN: 70 mL/min/{1.73_m2} (ref >59)
GLUCOSE: 131 mg/dL — AB (ref 65–99)
Globulin, Total: 2.3 g/dL (ref 1.5–4.5)
POTASSIUM: 4.2 mmol/L (ref 3.5–5.2)
Sodium: 141 mmol/L (ref 134–144)
TOTAL PROTEIN: 6.5 g/dL (ref 6.0–8.5)

## 2014-04-17 LAB — CBC WITH DIFFERENTIAL
BASOS ABS: 0 10*3/uL (ref 0.0–0.2)
Basos: 0 %
EOS ABS: 0.1 10*3/uL (ref 0.0–0.4)
Eos: 1 %
HCT: 43.7 % (ref 37.5–51.0)
Hemoglobin: 14.9 g/dL (ref 12.6–17.7)
IMMATURE GRANS (ABS): 0 10*3/uL (ref 0.0–0.1)
Immature Granulocytes: 0 %
Lymphocytes Absolute: 2.1 10*3/uL (ref 0.7–3.1)
Lymphs: 22 %
MCH: 31.6 pg (ref 26.6–33.0)
MCHC: 34.1 g/dL (ref 31.5–35.7)
MCV: 93 fL (ref 79–97)
MONOCYTES: 9 %
Monocytes Absolute: 0.8 10*3/uL (ref 0.1–0.9)
NEUTROS ABS: 6.2 10*3/uL (ref 1.4–7.0)
NEUTROS PCT: 68 %
RBC: 4.71 x10E6/uL (ref 4.14–5.80)
RDW: 13.5 % (ref 12.3–15.4)
WBC: 9.3 10*3/uL (ref 3.4–10.8)

## 2014-04-17 LAB — LIPID PANEL
Chol/HDL Ratio: 2.4 ratio units (ref 0.0–5.0)
Cholesterol, Total: 129 mg/dL (ref 100–199)
HDL: 53 mg/dL (ref >39)
LDL Calculated: 61 mg/dL (ref 0–99)
Triglycerides: 73 mg/dL (ref 0–149)
VLDL Cholesterol Cal: 15 mg/dL (ref 5–40)

## 2014-04-18 ENCOUNTER — Ambulatory Visit (INDEPENDENT_AMBULATORY_CARE_PROVIDER_SITE_OTHER): Payer: PPO | Admitting: Internal Medicine

## 2014-04-18 ENCOUNTER — Encounter: Payer: Self-pay | Admitting: Internal Medicine

## 2014-04-18 VITALS — BP 116/62 | HR 87 | Temp 97.6°F | Ht 71.0 in | Wt 182.6 lb

## 2014-04-18 DIAGNOSIS — G232 Striatonigral degeneration: Secondary | ICD-10-CM

## 2014-04-18 DIAGNOSIS — E1143 Type 2 diabetes mellitus with diabetic autonomic (poly)neuropathy: Secondary | ICD-10-CM

## 2014-04-18 DIAGNOSIS — G238 Other specified degenerative diseases of basal ganglia: Secondary | ICD-10-CM

## 2014-04-18 DIAGNOSIS — E039 Hypothyroidism, unspecified: Secondary | ICD-10-CM

## 2014-04-18 DIAGNOSIS — E785 Hyperlipidemia, unspecified: Secondary | ICD-10-CM

## 2014-04-18 DIAGNOSIS — I10 Essential (primary) hypertension: Secondary | ICD-10-CM

## 2014-04-18 DIAGNOSIS — J069 Acute upper respiratory infection, unspecified: Secondary | ICD-10-CM | POA: Insufficient documentation

## 2014-04-18 MED ORDER — GLIPIZIDE ER 5 MG PO TB24: ORAL_TABLET | ORAL | Status: DC

## 2014-04-18 MED ORDER — GABAPENTIN 100 MG PO CAPS: ORAL_CAPSULE | ORAL | Status: DC

## 2014-04-18 MED ORDER — TAMSULOSIN HCL 0.4 MG PO CAPS: ORAL_CAPSULE | ORAL | Status: DC

## 2014-04-18 MED ORDER — AMLODIPINE BESYLATE 5 MG PO TABS: ORAL_TABLET | ORAL | Status: DC

## 2014-04-18 MED ORDER — BENAZEPRIL HCL 5 MG PO TABS: ORAL_TABLET | ORAL | Status: DC

## 2014-04-18 MED ORDER — METFORMIN HCL 1000 MG PO TABS: ORAL_TABLET | ORAL | Status: DC

## 2014-04-18 MED ORDER — SIMVASTATIN 40 MG PO TABS: ORAL_TABLET | ORAL | Status: DC

## 2014-04-18 MED ORDER — CARBIDOPA-LEVODOPA 25-100 MG PO TABS: ORAL_TABLET | ORAL | Status: DC

## 2014-04-18 NOTE — Progress Notes (Signed)
Patient ID: Nicholas Barnett, male   DOB: 04-Apr-1931, 81 y.o.   MRN: [id number]    Facility  [facility]    Place of Service:   OFFICE   Allergies  Allergen Reactions  . Caffeine     Chief Complaint  Patient presents with  . Medical Management of Chronic Issues    6 Month Follow up    HPI:  Recent upper respiratory tract infection of a viral nature. Started as a sore throat. Has a persistent cough. He says he caught it from his wife who in turn seems to have caught it from their grandchildren. He believes is getting over it now.  Essential hypertension: Controlled  Hypothyroidism, unspecified hypothyroidism type: Compensated  Hyperlipemia: Controlled  Parkinson's plus syndrome: Unchanged. Continues to be followed by neurologist, Dr. Arbutus Leas  Type 2 diabetes mellitus with autonomic neuropathy: Controlled    Medications: Patient's Medications  New Prescriptions   No medications on file  Previous Medications   AMLODIPINE (NORVASC) 5 MG TABLET    Take one tablet by mouth once daily to control blood pressure   ASPIRIN 81 MG TABLET    Take 81 mg by mouth every morning.    BENAZEPRIL (LOTENSIN) 5 MG TABLET    Take one tablet by mouth once daily for blood pressure   BLOOD GLUCOSE MONITORING SUPPL (FREESTYLE LITE) DEVI    Use to check blood sugar once daily Dx E11.43   CALCIUM CARBONATE-VITAMIN D (CALCIUM-CARB 600 + D) 600-125 MG-UNIT TABS    Take 2 tablets by mouth every morning.    CARBIDOPA-LEVODOPA (SINEMET IR) 25-100 MG PER TABLET    Two tablets morning, two tablets in the afternoon, one tablet at night   CYANOCOBALAMIN (,VITAMIN B-12,) 1000 MCG/ML INJECTION    1 mL into muscle every 30 days. DX: b12 deficiency   GABAPENTIN (NEURONTIN) 100 MG CAPSULE    TAKE 3 BY MOUTH ONCE DAILY WITH SUPPER FOR RESTLESS LEG   GLIPIZIDE (GLUCOTROL XL) 5 MG 24 HR TABLET    Take one tablet by mouth once daily to control blood sugar   GLUCOSE BLOOD (FREESTYLE LITE) TEST STRIP    Use to check blood sugar  once daily. Dx E11.43   LANCETS (FREESTYLE) LANCETS    Use to check blood sugar once daily Dx E11.43   METFORMIN (GLUCOPHAGE) 1000 MG TABLET    Take one tablet by mouth twice daily to control blood sugar   MUPIROCIN OINTMENT (BACTROBAN) 2 %    Apply nightly to lesion on scrotum   SIMVASTATIN (ZOCOR) 40 MG TABLET    Take one tablet by mouth once daily for cholesterol   SYRINGE/NEEDLE, DISP, (SYRINGE 3CC/25GX1") 25G X 1" 3 ML MISC    Use as directed with Cyanocobalmin   TAMSULOSIN (FLOMAX) 0.4 MG CAPS CAPSULE    Take one tablet by mouth once daily at bedtime to prevent urinary frequency  Modified Medications   No medications on file  Discontinued Medications   No medications on file     Review of Systems  Constitutional: Positive for appetite change (Loss of appetite) and fatigue.       Losing weight  HENT: Positive for hearing loss (Wearing hearing aids) and voice change (Voice is weaker.). Negative for ear pain.        Edentulous  Eyes: Negative.   Respiratory: Positive for cough (Related to recurrent aspiration) and shortness of breath.   Cardiovascular: Negative for chest pain, palpitations and leg swelling.  Gastrointestinal: Positive for constipation.  History of diverticulitis. Mild dysphagia.  Endocrine:       Diabetic.  Genitourinary:       Weak stream. Occasional urinary incontinence.  Musculoskeletal: Positive for myalgias, back pain, arthralgias, gait problem, neck pain and neck stiffness.  Skin: Negative for pallor, rash and wound.       Area on scrotum that is weeping and raw.  Allergic/Immunologic: Negative.   Neurological: Positive for dizziness, tremors and weakness (Generalized).       Memory loss. Confusion.  Hematological:       Petechia  Psychiatric/Behavioral: Positive for sleep disturbance, dysphoric mood and agitation. The patient is nervous/anxious.     Filed Vitals:   04/18/14 1131  BP: 116/62  Pulse: 87  Temp: 97.6 F (36.4 C)  TempSrc: Oral   Height:  (1.803 m)  Weight: 182 lb 9.6 oz (82.827 kg)   Body mass index is 25.48 kg/(m^2).  Physical Exam  Constitutional: He is oriented to person, place, and time.  Thin. Fragile. Elderly.  HENT:  Head: Normocephalic and atraumatic.  Right Ear: External ear normal.  Left Ear: External ear normal.  Nose: Nose normal.  Mouth/Throat: Oropharynx is clear and moist.  Loss of hearing. Hearing aids. Edentulous.  Eyes: Conjunctivae and EOM are normal. Pupils are equal, round, and reactive to light.  Neck: No JVD present. No tracheal deviation present. No thyromegaly present.  Cardiovascular: Normal rate, regular rhythm, normal heart sounds and intact distal pulses.  Exam reveals no gallop and no friction rub.   No murmur heard. Pulmonary/Chest: Breath sounds normal. No respiratory distress. He has no wheezes. He has no rales.  Abdominal: He exhibits no distension and no mass. There is no tenderness.  Musculoskeletal: He exhibits no edema or tenderness.  Tender in both shoulders. Mild restriction of movement of the shoulders. Unstable gait.  Lymphadenopathy:    He has no cervical adenopathy.  Neurological: He is alert and oriented to person, place, and time. He displays abnormal reflex. No cranial nerve deficit. He exhibits normal muscle tone. Coordination normal.  Diminished DTR bilaterally and patella..  Skin: No rash noted. No erythema. No pallor.  Area on upper right scrotum that the skin is rw and weeping.  Psychiatric: He has a normal mood and affect. His behavior is normal. Judgment and thought content normal.     Labs reviewed: Appointment on 04/16/2014  Component Date Value Ref Range Status  . WBC 04/16/2014 9.3  3.4 - 10.8 x10E3/uL Final  . RBC 04/16/2014 4.71  4.14 - 5.80 x10E6/uL Final  . Hemoglobin 04/16/2014 14.9  12.6 - 17.7 g/dL Final  . HCT 16/11/9602 43.7  37.5 - 51.0 % Final  . MCV 04/16/2014 93  79 - 97 fL Final  . MCH 04/16/2014 31.6  26.6 - 33.0 pg  Final  . MCHC 04/16/2014 34.1  31.5 - 35.7 g/dL Final  . RDW 54/10/8117 13.5  12.3 - 15.4 % Final  . Neutrophils Relative % 04/16/2014 68   Final  . Lymphs 04/16/2014 22   Final  . Monocytes 04/16/2014 9   Final  . Eos 04/16/2014 1   Final  . Basos 04/16/2014 0   Final  . Neutrophils Absolute 04/16/2014 6.2  1.4 - 7.0 x10E3/uL Final  . Lymphocytes Absolute 04/16/2014 2.1  0.7 - 3.1 x10E3/uL Final  . Monocytes Absolute 04/16/2014 0.8  0.1 - 0.9 x10E3/uL Final  . Eosinophils Absolute 04/16/2014 0.1  0.0 - 0.4 x10E3/uL Final  . Basophils Absolute 04/16/2014 0.0  0.0 - 0.2 x10E3/uL Final  . Immature Granulocytes 04/16/2014 0   Final  . Immature Grans (Abs) 04/16/2014 0.0  0.0 - 0.1 x10E3/uL Final  . Cholesterol, Total 04/16/2014 129  100 - 199 mg/dL Final  . Triglycerides 04/16/2014 73  0 - 149 mg/dL Final  . HDL 40/98/1191 53  >39 mg/dL Final   Comment: According to ATP-III Guidelines, HDL-C >59 mg/dL is considered a negative risk factor for CHD.   Marland Kitchen VLDL Cholesterol Cal 04/16/2014 15  5 - 40 mg/dL Final  . LDL Calculated 04/16/2014 61  0 - 99 mg/dL Final  . Chol/HDL Ratio 04/16/2014 2.4  0.0 - 5.0 ratio units Final   Comment:                                   T. Chol/HDL Ratio                                             Men  Women                               1/2 Avg.Risk  3.4    3.3                                   Avg.Risk  5.0    4.4                                2X Avg.Risk  9.6    7.1                                3X Avg.Risk 23.4   11.0   . Glucose 04/16/2014 131* 65 - 99 mg/dL Final  . BUN 47/82/9562 26  8 - 27 mg/dL Final  . Creatinine, Ser 04/16/2014 1.01  0.76 - 1.27 mg/dL Final  . GFR calc non Af Amer 04/16/2014 70  >59 mL/min/1.73 Final  . GFR calc Af Amer 04/16/2014 81  >59 mL/min/1.73 Final  . BUN/Creatinine Ratio 04/16/2014 26* 10 - 22 Final  . Sodium 04/16/2014 141  134 - 144 mmol/L Final  . Potassium 04/16/2014 4.2  3.5 - 5.2 mmol/L Final  . Chloride  04/16/2014 100  97 - 108 mmol/L Final  . CO2 04/16/2014 25  18 - 29 mmol/L Final  . Calcium 04/16/2014 10.0  8.6 - 10.2 mg/dL Final  . Total Protein 04/16/2014 6.5  6.0 - 8.5 g/dL Final  . Albumin 13/09/6576 4.2  3.5 - 4.7 g/dL Final  . Globulin, Total 04/16/2014 2.3  1.5 - 4.5 g/dL Final  . Albumin/Globulin Ratio 04/16/2014 1.8  1.1 - 2.5 Final  . Bilirubin Total 04/16/2014 0.4  0.0 - 1.2 mg/dL Final  . Alkaline Phosphatase 04/16/2014 92  39 - 117 IU/L Final  . AST 04/16/2014 13  0 - 40 IU/L Final  . ALT 04/16/2014 11  0 - 44 IU/L Final  Abstract on 03/29/2014  Component Date Value Ref Range Status  . HM Diabetic Eye Exam 03/22/2014 No Retinopathy  No Retinopathy Final  [facility]     Assessment/Plan  1. Essential hypertension - Basic metabolic panel; Future  2. Hypothyroidism, unspecified hypothyroidism type Continue current dose levothyroxine  3. Hyperlipemia - Lipid panel; Future  4. Parkinson's plus syndrome Unchanged  5. Type 2 diabetes mellitus with autonomic neuropathy - Basic metabolic panel; Future - Hemoglobin A1c; Future - Microalbumin, urine; Future  6. Acute upper respiratory infection Continue with over-the-counter Mucinex DM and Delsym as needed for cough.

## 2014-04-26 ENCOUNTER — Other Ambulatory Visit: Payer: Self-pay | Admitting: *Deleted

## 2014-04-26 MED ORDER — BENAZEPRIL HCL 5 MG PO TABS: ORAL_TABLET | ORAL | Status: DC

## 2014-04-26 MED ORDER — TAMSULOSIN HCL 0.4 MG PO CAPS: ORAL_CAPSULE | ORAL | Status: DC

## 2014-04-26 MED ORDER — METFORMIN HCL 1000 MG PO TABS: ORAL_TABLET | ORAL | Status: DC

## 2014-04-26 MED ORDER — GLIPIZIDE ER 5 MG PO TB24: ORAL_TABLET | ORAL | Status: DC

## 2014-04-26 MED ORDER — SIMVASTATIN 40 MG PO TABS: ORAL_TABLET | ORAL | Status: DC

## 2014-04-26 MED ORDER — GABAPENTIN 100 MG PO CAPS: ORAL_CAPSULE | ORAL | Status: DC

## 2014-04-26 MED ORDER — AMLODIPINE BESYLATE 5 MG PO TABS: ORAL_TABLET | ORAL | Status: DC

## 2014-04-26 NOTE — Telephone Encounter (Signed)
Mail Order Rx's faxed. Patient wife Requested

## 2014-05-01 ENCOUNTER — Telehealth: Payer: Self-pay | Admitting: *Deleted

## 2014-05-01 ENCOUNTER — Encounter: Payer: Self-pay | Admitting: *Deleted

## 2014-05-01 MED ORDER — PRAVASTATIN SODIUM 20 MG PO TABS: ORAL_TABLET | ORAL | Status: DC

## 2014-05-01 NOTE — Telephone Encounter (Signed)
[facility] from [facility] #[phone] stating a Drug-Drug interaction with Simvastatin-Amlodipine. Stating literature indicates taking both these types of medication at these strengths [place] [place] produce rhabdomyolysis.  Dr. Chilton SiGreen changed patients Simvastatin to Pravastatin. I changed this in the system and faxed to pharmacy and notified wife.

## 2014-05-01 NOTE — Telephone Encounter (Signed)
error 

## 2014-05-15 ENCOUNTER — Ambulatory Visit (INDEPENDENT_AMBULATORY_CARE_PROVIDER_SITE_OTHER): Payer: PPO | Admitting: Neurology

## 2014-05-15 ENCOUNTER — Encounter: Payer: Self-pay | Admitting: Neurology

## 2014-05-15 VITALS — BP 120/70 | HR 80 | Ht 71.0 in | Wt 185.0 lb

## 2014-05-15 DIAGNOSIS — G239 Degenerative disease of basal ganglia, unspecified: Secondary | ICD-10-CM

## 2014-05-15 DIAGNOSIS — G903 Multi-system degeneration of the autonomic nervous system: Secondary | ICD-10-CM

## 2014-05-15 DIAGNOSIS — R1314 Dysphagia, pharyngoesophageal phase: Secondary | ICD-10-CM

## 2014-05-15 NOTE — Progress Notes (Signed)
Nicholas Barnett was seen today in the movement disorders clinic for neurologic consultation at the request of GREEN, Lenon Curt, MD.  The consultation is for the evaluation of Parkinsonism.  The patient has previously seen Dr. Sandria Manly and Dr. Marjory Lies.  Records were reviewed from both of those physicians.  The patient's symptoms started in 2010.  First symptoms consisted of dizziness and falls.  He began to see Dr. Sandria Manly around the time that symptoms began.  He first had an MRI of the brain in February 2011 that demonstrated rather significant atrophy.  B12 at that time was found to be low at 181.  He had a swallow study in June, 2011 that demonstrated moderate dysphagia with intermittent aspiration of thin liquids.  He was tried on levodopa (I do not know what dose and they don't even remember the medication or how long he was on it) and that did not seem to help.  He was later tried on ropinirole which also did not help.  He was tried on, and is still on, gabapentin for restless leg syndrome.  His most recent MRI of the brain was on 11/21/2013 and I reviewed this as well as his previous MRI of the brain.  There remains very significant atrophy, although there is not significant basal ganglia disease.  There is mild to moderate small vessel disease.  02/13/14 update:  Pt was seen today for UPDRS motor on/off testing.  He is accompanied by his family who supplements the hx.  Pt had MBE on 02/05/14 and demonstrated mild oral and mod pharyngeal phase dysphagia and mechanical soft with honey thick liquid was recommended.  He is still awaiting ST to contact him (out pt).  05/15/14 update:  The patient is accompanied by his family supplements the history.  Patient has a history of multiple system atrophy.  He has been minimally responsive to levodopa via UPDRS on/off last visit but they wanted to retry the medication at last visit and he was told to take 2 tablets at 11 AM (wakeup time) 2 at 3 PM and 1 at 7 PM.  Pt  states that last visit he walked in here with a walker and left without it after the on/off test.  Has felt much better ever since.  He was doing well in PT but pt states that the last time he went he got put on a machine that hurt his shoulder.  His wife does state that the levodopa has helped him remarkably.  He is now able to do much of his own dressing and bathing and he was not able to do that previously.  No falls since our last visit.  Pt states that he could not deal with the thickened food and understands the risks of not following the recommended diet.  He is, however, eating better and has a much better appetite.  He has gained some weight.  He did not get speech therapy; wife states that they were to call back to set this up but didn't.    Neuroimaging has previously been performed.  It is available for my review today.  I reviewed pts MRI brain from 11/21/13.  There was no evidence of BG disease but there was very significant atrophy.    PREVIOUS MEDICATIONS: Sinemet and Requip  ALLERGIES:   Allergies  Allergen Reactions  . Caffeine     CURRENT MEDICATIONS:  Outpatient Encounter Prescriptions as of 05/15/2014  Medication Sig  . amLODipine (NORVASC) 5 MG tablet  Take one tablet by mouth once daily to control blood pressure  . aspirin 81 MG tablet Take 81 mg by mouth every morning.   . benazepril (LOTENSIN) 5 MG tablet Take one tablet by mouth once daily for blood pressure  . Calcium Carbonate-Vitamin D (CALCIUM-CARB 600 + D) 600-125 MG-UNIT TABS Take 2 tablets by mouth every morning.   . carbidopa-levodopa (SINEMET IR) 25-100 MG per tablet Two tablets morning, two tablets in the afternoon, one tablet at night  . cyanocobalamin (,VITAMIN B-12,) 1000 MCG/ML injection 1 mL into muscle every 30 days. DX: b12 deficiency  . gabapentin (NEURONTIN) 100 MG capsule Take 3 at supper to prevent restless legs  . glipiZIDE (GLUCOTROL XL) 5 MG 24 hr tablet Take one tablet by mouth once daily to  control blood sugar  . metFORMIN (GLUCOPHAGE) 1000 MG tablet Take one tablet by mouth twice daily to control blood sugar  . pravastatin (PRAVACHOL) 20 MG tablet Take one tablet by mouth once daily to control cholesterol  . tamsulosin (FLOMAX) 0.4 MG CAPS capsule Take one tablet by mouth once daily at bedtime to prevent urinary frequency  . Blood Glucose Monitoring Suppl (FREESTYLE LITE) DEVI Use to check blood sugar once daily Dx E11.43 (Patient not taking: Reported on 05/15/2014)  . glucose blood (FREESTYLE LITE) test strip Use to check blood sugar once daily. Dx E11.43 (Patient not taking: Reported on 05/15/2014)  . Lancets (FREESTYLE) lancets Use to check blood sugar once daily Dx E11.43 (Patient not taking: Reported on 05/15/2014)  . Syringe/Needle, Disp, (SYRINGE 3CC/25GX1") 25G X 1" 3 ML MISC Use as directed with Cyanocobalmin (Patient not taking: Reported on 05/15/2014)  . [DISCONTINUED] mupirocin ointment (BACTROBAN) 2 % Apply nightly to lesion on scrotum    PAST MEDICAL HISTORY:   Past Medical History  Diagnosis Date  . Type 2 diabetes mellitus with autonomic neuropathy   . Hyperlipidemia   . Hypertension   . Arthritis   . Diverticulitis   . Parkinsonism   . Right knee pain   . Memory loss   . Depression   . Gait disturbance   . Restless leg   . Thyroid disease   . Vitamin D deficiency   . Osteoarthritis   . Osteoporosis   . Dysphagia, idiopathic     History of aspiration  . Recurrent falls     Using a cane and walker  . Dizzy   . Hearing loss of both ears   . Neck pain   . Thoracic back pain   . Lumbar back pain   . Shoulder pain   . Cyanocobalamin deficiency   . Other and unspecified hyperlipidemia   . Unspecified essential hypertension   . Abnormality of gait   . Unspecified hypothyroidism   . Restless legs syndrome (RLS)     PAST SURGICAL HISTORY:   Past Surgical History  Procedure Laterality Date  . Cataract extraction Bilateral 2012    Dr. Hazle Quant     SOCIAL HISTORY:   History   Social History  . Marital Status: Married    Spouse Name: Kathie Rhodes  . Number of Children: 3  . Years of Education: College   Occupational History  . Retired     Engineer, petroleum [employer]   Social History Main Topics  . Smoking status: Former Smoker    Quit date: 01/31/1974  . Smokeless tobacco: Never Used  . Alcohol Use: No  . Drug Use: No  . Sexual Activity: No   Other Topics Concern  .  Not on file   Social History Narrative   Married 1956. 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   Patient lives at home with his family.   Caffeine Use: none   Patient has a living will and healthcare power of attorney.    FAMILY HISTORY:   Family Status  Relation Status Death Age  . Mother Deceased 7974    stroke, MI  . Father Deceased [id number]    prostate cancer  . Sister Deceased [id number]    diabetic, cirrhosis  . Brother Deceased 10 months    crib death  . Brother Deceased [id number]    MI    ROS:  A complete 10 system review of systems was obtained and was unremarkable apart from what is mentioned above.  PHYSICAL EXAMINATION:    VITALS:   Filed Vitals:   05/15/14 1315  BP: 120/70  Pulse: 80  Height: 5\' 11"  (1.803 m)  Weight: 185 lb (83.915 kg)   Wt Readings from Last 3 Encounters:  05/15/14 185 lb (83.915 kg)  04/18/14 182 lb 9.6 oz (82.827 kg)  02/13/14 179 lb (81.194 kg)     GEN:  The patient appears stated age and is in NAD.  Some pathologic laughter. HEENT:  Normocephalic, atraumatic.  The mucous membranes are moist. The superficial temporal arteries are without ropiness or tenderness. CV:  RRR.  Some inspirational stridor Lungs:  CTAB Neck/HEME:  There are no carotid bruits bilaterally.  There is neck flexion.    Neurological examination:  Orientation: The patient is alert and oriented x3. Fund of knowledge is appropriate.  Recent and remote memory are intact.  Attention and concentration are normal.    Able to name objects and repeat phrases. Cranial  nerves: There is good facial symmetry. There is facial hypomimia.   There are square wave jerks.  The visual fields are full to confrontational testing. The speech is fluent and clear. Soft palate rises symmetrically and there is no tongue deviation. Hearing is intact to conversational tone. Sensation: Sensation is intact to light touch throughout.   Movement examination: Tone: There is normal tone in the RUE.  There is normal tone in the LUE and bilateral lower extremities.   Abnormal movements: none Coordination:  There is decremation with RAM's, seen with finger taps, hand opening and closing, heel taps, and toe taps, especially on the right.   Gait and Station: The patient has  difficulty arising out of a deep-seated chair without the use of the hands and after 2 attempts is able to push out of the chair and get up.  He is very wide based.  He doesn't shuffle today and stride length is much improved.          ASSESSMENT/PLAN:  1.  Multiple system atrophy  -Minimal response to levodopa on our UPDRS motor on/off test, but patient and his wife report that he is doing markedly better on levodopa at home.  He is now able to do activities of daily living that he previously could not.  He is no longer freezing.  He is no longer having to use the walker and his walking is somewhat more stable. 2.  Dysphagia.  -Pt had MBE on 02/05/14 and demonstrated mild oral and mod pharyngeal phase dysphagia and mechanical soft with honey thick liquid was recommended.  He is not following this and understands risks of doing that. 3.  Sialorrhea.  -Talked about the value of Myobloc.  He will let me know if and when  he would like to proceed. 4.  I will follow up with in the next few months, sooner should new neuro issues arise.

## 2014-07-11 ENCOUNTER — Other Ambulatory Visit: Payer: Self-pay | Admitting: *Deleted

## 2014-07-11 ENCOUNTER — Other Ambulatory Visit: Payer: PPO

## 2014-07-11 ENCOUNTER — Telehealth (INDEPENDENT_AMBULATORY_CARE_PROVIDER_SITE_OTHER): Payer: PPO | Admitting: *Deleted

## 2014-07-11 DIAGNOSIS — R8290 Unspecified abnormal findings in urine: Secondary | ICD-10-CM

## 2014-07-11 DIAGNOSIS — R82998 Other abnormal findings in urine: Secondary | ICD-10-CM

## 2014-07-11 DIAGNOSIS — R8299 Other abnormal findings in urine: Secondary | ICD-10-CM

## 2014-07-11 LAB — POCT URINALYSIS DIPSTICK
BILIRUBIN UA: NEGATIVE
Blood, UA: NEGATIVE
Glucose, UA: NEGATIVE
Ketones, UA: NEGATIVE
LEUKOCYTES UA: NEGATIVE
NITRITE UA: NEGATIVE
PROTEIN UA: NEGATIVE
Spec Grav, UA: 1.01
Urobilinogen, UA: 0.2
pH, UA: 6.5

## 2014-07-11 NOTE — Addendum Note (Signed)
Addended by: Lisandro Meggett C on: 07/11/2014 03:19 PM   Modules accepted: Orders  

## 2014-07-11 NOTE — Addendum Note (Signed)
Addended by: Maurice SmallBEATTY, Teonna Coonan C on: 07/11/2014 03:19 PM   Modules accepted: Orders

## 2014-07-11 NOTE — Telephone Encounter (Signed)
Wife called regarding UTI on her spouse, wanting to know if she could bring in a urine sample, dark with strong smell. Stated that she would bring by the office today.

## 2014-07-11 NOTE — Telephone Encounter (Signed)
Patient walked into the office to collect urine sample as instructed by triage assistant Jasmine December(Sharon Rice/RMA). In house urine dip completed (see report under lab tab). Urine was normal. Please advise

## 2014-07-11 NOTE — Telephone Encounter (Signed)
Urine specimen quick test for nitrites is negative and there is no evidence of pyuria. Creatinine culture the specimen. I would prefer not to give antibiotics at this time.

## 2014-07-12 NOTE — Telephone Encounter (Signed)
Urine was sent for culture per Dr.Green's instructions from yesterday

## 2014-07-13 LAB — URINE CULTURE

## 2014-07-26 ENCOUNTER — Telehealth: Payer: Self-pay | Admitting: *Deleted

## 2014-07-26 NOTE — Telephone Encounter (Signed)
Patient wife called concerned after getting a letter from [employer] stating there was a bill of $232.45 and they would settle for $116.22. Patient wife is unaware of an outstanding bill. I took the information and gave it to Whartonynthia and she stated that as soon as she gets in touch with the company she will call the patient back . Wife agreed.

## 2014-07-28 ENCOUNTER — Observation Stay (HOSPITAL_COMMUNITY): Payer: PPO

## 2014-07-28 ENCOUNTER — Encounter (HOSPITAL_COMMUNITY): Payer: Self-pay | Admitting: Radiology

## 2014-07-28 ENCOUNTER — Emergency Department (HOSPITAL_COMMUNITY): Payer: PPO

## 2014-07-28 ENCOUNTER — Observation Stay (HOSPITAL_COMMUNITY)
Admission: EM | Admit: 2014-07-28 | Discharge: 2014-07-30 | Disposition: A | Discharge status: Home / Self Care | Payer: PPO | Attending: Internal Medicine | Admitting: Internal Medicine

## 2014-07-28 DIAGNOSIS — G238 Other specified degenerative diseases of basal ganglia: Secondary | ICD-10-CM

## 2014-07-28 DIAGNOSIS — M25519 Pain in unspecified shoulder: Secondary | ICD-10-CM

## 2014-07-28 DIAGNOSIS — R0789 Other chest pain: Secondary | ICD-10-CM | POA: Diagnosis not present

## 2014-07-28 DIAGNOSIS — F528 Other sexual dysfunction not due to a substance or known physiological condition: Secondary | ICD-10-CM

## 2014-07-28 DIAGNOSIS — G4700 Insomnia, unspecified: Secondary | ICD-10-CM

## 2014-07-28 DIAGNOSIS — I10 Essential (primary) hypertension: Secondary | ICD-10-CM | POA: Diagnosis not present

## 2014-07-28 DIAGNOSIS — M810 Age-related osteoporosis without current pathological fracture: Secondary | ICD-10-CM | POA: Insufficient documentation

## 2014-07-28 DIAGNOSIS — Z87891 Personal history of nicotine dependence: Secondary | ICD-10-CM | POA: Insufficient documentation

## 2014-07-28 DIAGNOSIS — R296 Repeated falls: Secondary | ICD-10-CM

## 2014-07-28 DIAGNOSIS — E559 Vitamin D deficiency, unspecified: Secondary | ICD-10-CM | POA: Diagnosis not present

## 2014-07-28 DIAGNOSIS — Z9842 Cataract extraction status, left eye: Secondary | ICD-10-CM | POA: Diagnosis not present

## 2014-07-28 DIAGNOSIS — E039 Hypothyroidism, unspecified: Secondary | ICD-10-CM | POA: Diagnosis not present

## 2014-07-28 DIAGNOSIS — Z9841 Cataract extraction status, right eye: Secondary | ICD-10-CM | POA: Diagnosis not present

## 2014-07-28 DIAGNOSIS — R52 Pain, unspecified: Secondary | ICD-10-CM

## 2014-07-28 DIAGNOSIS — R269 Unspecified abnormalities of gait and mobility: Secondary | ICD-10-CM

## 2014-07-28 DIAGNOSIS — R1310 Dysphagia, unspecified: Secondary | ICD-10-CM | POA: Diagnosis not present

## 2014-07-28 DIAGNOSIS — R9431 Abnormal electrocardiogram [ECG] [EKG]: Secondary | ICD-10-CM | POA: Diagnosis present

## 2014-07-28 DIAGNOSIS — J690 Pneumonitis due to inhalation of food and vomit: Secondary | ICD-10-CM | POA: Diagnosis not present

## 2014-07-28 DIAGNOSIS — R351 Nocturia: Secondary | ICD-10-CM

## 2014-07-28 DIAGNOSIS — M25561 Pain in right knee: Secondary | ICD-10-CM

## 2014-07-28 DIAGNOSIS — N492 Inflammatory disorders of scrotum: Secondary | ICD-10-CM

## 2014-07-28 DIAGNOSIS — G20 Parkinson's disease: Secondary | ICD-10-CM | POA: Insufficient documentation

## 2014-07-28 DIAGNOSIS — Z7982 Long term (current) use of aspirin: Secondary | ICD-10-CM | POA: Insufficient documentation

## 2014-07-28 DIAGNOSIS — G20C Parkinsonism, unspecified: Secondary | ICD-10-CM | POA: Diagnosis present

## 2014-07-28 DIAGNOSIS — F329 Major depressive disorder, single episode, unspecified: Secondary | ICD-10-CM | POA: Diagnosis not present

## 2014-07-28 DIAGNOSIS — Z794 Long term (current) use of insulin: Secondary | ICD-10-CM | POA: Insufficient documentation

## 2014-07-28 DIAGNOSIS — M1990 Unspecified osteoarthritis, unspecified site: Secondary | ICD-10-CM | POA: Diagnosis not present

## 2014-07-28 DIAGNOSIS — Z79899 Other long term (current) drug therapy: Secondary | ICD-10-CM | POA: Insufficient documentation

## 2014-07-28 DIAGNOSIS — Z885 Allergy status to narcotic agent status: Secondary | ICD-10-CM | POA: Diagnosis not present

## 2014-07-28 DIAGNOSIS — R079 Chest pain, unspecified: Secondary | ICD-10-CM | POA: Diagnosis present

## 2014-07-28 DIAGNOSIS — M19012 Primary osteoarthritis, left shoulder: Secondary | ICD-10-CM | POA: Insufficient documentation

## 2014-07-28 DIAGNOSIS — R42 Dizziness and giddiness: Secondary | ICD-10-CM

## 2014-07-28 DIAGNOSIS — M25512 Pain in left shoulder: Secondary | ICD-10-CM

## 2014-07-28 DIAGNOSIS — G2581 Restless legs syndrome: Secondary | ICD-10-CM | POA: Diagnosis not present

## 2014-07-28 DIAGNOSIS — M19011 Primary osteoarthritis, right shoulder: Secondary | ICD-10-CM | POA: Diagnosis not present

## 2014-07-28 DIAGNOSIS — M549 Dorsalgia, unspecified: Secondary | ICD-10-CM

## 2014-07-28 DIAGNOSIS — E785 Hyperlipidemia, unspecified: Secondary | ICD-10-CM | POA: Diagnosis not present

## 2014-07-28 DIAGNOSIS — I452 Bifascicular block: Secondary | ICD-10-CM | POA: Insufficient documentation

## 2014-07-28 DIAGNOSIS — E538 Deficiency of other specified B group vitamins: Secondary | ICD-10-CM | POA: Insufficient documentation

## 2014-07-28 DIAGNOSIS — M546 Pain in thoracic spine: Secondary | ICD-10-CM | POA: Diagnosis present

## 2014-07-28 DIAGNOSIS — I2510 Atherosclerotic heart disease of native coronary artery without angina pectoris: Secondary | ICD-10-CM | POA: Diagnosis not present

## 2014-07-28 DIAGNOSIS — H9193 Unspecified hearing loss, bilateral: Secondary | ICD-10-CM

## 2014-07-28 DIAGNOSIS — E1143 Type 2 diabetes mellitus with diabetic autonomic (poly)neuropathy: Secondary | ICD-10-CM | POA: Diagnosis present

## 2014-07-28 DIAGNOSIS — J069 Acute upper respiratory infection, unspecified: Secondary | ICD-10-CM

## 2014-07-28 DIAGNOSIS — G232 Striatonigral degeneration: Secondary | ICD-10-CM

## 2014-07-28 DIAGNOSIS — R413 Other amnesia: Secondary | ICD-10-CM

## 2014-07-28 DIAGNOSIS — M542 Cervicalgia: Secondary | ICD-10-CM

## 2014-07-28 DIAGNOSIS — R3915 Urgency of urination: Secondary | ICD-10-CM

## 2014-07-28 HISTORY — DX: Bifascicular block: I45.2

## 2014-07-28 LAB — BLOOD GAS, ARTERIAL
Acid-Base Excess: 0.7 mmol/L (ref 0.0–2.0)
Bicarbonate: 23.8 mEq/L (ref 20.0–24.0)
DRAWN BY: 103701
FIO2: 0.21 %
O2 SAT: 92.6 %
PO2 ART: 63.6 mmHg — AB (ref 80.0–100.0)
Patient temperature: 98.6
TCO2: 20.7 mmol/L (ref 0–100)
pCO2 arterial: 34.9 mmHg — ABNORMAL LOW (ref 35.0–45.0)
pH, Arterial: 7.449 (ref 7.350–7.450)

## 2014-07-28 LAB — TROPONIN I
Troponin I: 0.31 ng/mL — ABNORMAL HIGH (ref <0.031)
Troponin I: 0.34 ng/mL — ABNORMAL HIGH (ref <0.031)
Troponin I: 0.42 ng/mL — ABNORMAL HIGH (ref <0.031)

## 2014-07-28 LAB — CBC
HCT: 43.9 % (ref 39.0–52.0)
HEMATOCRIT: 43.6 % (ref 39.0–52.0)
Hemoglobin: 14.7 g/dL (ref 13.0–17.0)
Hemoglobin: 14.7 g/dL (ref 13.0–17.0)
MCH: 31.7 pg (ref 26.0–34.0)
MCH: 30.8 pg (ref 26.0–34.0)
MCHC: 33.7 g/dL (ref 30.0–36.0)
MCHC: 33.5 g/dL (ref 30.0–36.0)
MCV: 94 fL (ref 78.0–100.0)
MCV: 92 fL (ref 78.0–100.0)
Platelets: 211 10*3/uL (ref 150–400)
Platelets: 200 10*3/uL (ref 150–400)
RBC: 4.64 MIL/uL (ref 4.22–5.81)
RBC: 4.77 MIL/uL (ref 4.22–5.81)
RDW: 13.2 % (ref 11.5–15.5)
RDW: 13.1 % (ref 11.5–15.5)
WBC: 13.5 10*3/uL — ABNORMAL HIGH (ref 4.0–10.5)
WBC: 14.2 10*3/uL — ABNORMAL HIGH (ref 4.0–10.5)

## 2014-07-28 LAB — I-STAT CHEM 8, ED
BUN: 19 mg/dL (ref 6–20)
CALCIUM ION: 1.12 mmol/L — AB (ref 1.13–1.30)
CREATININE: 0.7 mg/dL (ref 0.61–1.24)
Chloride: 104 mmol/L (ref 101–111)
GLUCOSE: 151 mg/dL — AB (ref 65–99)
HEMATOCRIT: 47 % (ref 39.0–52.0)
Hemoglobin: 16 g/dL (ref 13.0–17.0)
POTASSIUM: 3.6 mmol/L (ref 3.5–5.1)
Sodium: 141 mmol/L (ref 135–145)
TCO2: 23 mmol/L (ref 0–100)

## 2014-07-28 LAB — COMPREHENSIVE METABOLIC PANEL
ALT: 13 U/L — AB (ref 17–63)
AST: 23 U/L (ref 15–41)
Albumin: 3.8 g/dL (ref 3.5–5.0)
Alkaline Phosphatase: 71 U/L (ref 38–126)
Anion gap: 11 (ref 5–15)
BILIRUBIN TOTAL: 0.8 mg/dL (ref 0.3–1.2)
BUN: 18 mg/dL (ref 6–20)
CO2: 24 mmol/L (ref 22–32)
CREATININE: 0.7 mg/dL (ref 0.61–1.24)
Calcium: 9 mg/dL (ref 8.9–10.3)
Chloride: 105 mmol/L (ref 101–111)
GFR calc Af Amer: >60 mL/min (ref >60)
GFR calc non Af Amer: >60 mL/min (ref >60)
Glucose, Bld: 144 mg/dL — ABNORMAL HIGH (ref 65–99)
Potassium: 3.7 mmol/L (ref 3.5–5.1)
Sodium: 140 mmol/L (ref 135–145)
TOTAL PROTEIN: 6.8 g/dL (ref 6.5–8.1)

## 2014-07-28 LAB — CREATININE, SERUM: Creatinine, Ser: 0.74 mg/dL (ref 0.61–1.24)

## 2014-07-28 LAB — GLUCOSE, CAPILLARY
GLUCOSE-CAPILLARY: 147 mg/dL — AB (ref 65–99)
Glucose-Capillary: 170 mg/dL — ABNORMAL HIGH (ref 65–99)

## 2014-07-28 LAB — PROTIME-INR
INR: 1.03 (ref 0.00–1.49)
INR: 1.07 (ref 0.00–1.49)
Prothrombin Time: 13.7 seconds (ref 11.6–15.2)
Prothrombin Time: 14.1 seconds (ref 11.6–15.2)

## 2014-07-28 LAB — I-STAT TROPONIN, ED: TROPONIN I, POC: 0.01 ng/mL (ref 0.00–0.08)

## 2014-07-28 LAB — APTT: aPTT: 27 seconds (ref 24–37)

## 2014-07-28 MED ORDER — NITROGLYCERIN 0.4 MG SL SUBL: 0.4000 mg | SUBLINGUAL_TABLET | SUBLINGUAL | Status: DC | PRN

## 2014-07-28 MED ORDER — ASPIRIN EC 81 MG PO TBEC
81.0000 mg | DELAYED_RELEASE_TABLET | Freq: Every morning | ORAL | Status: DC
Start: 2014-07-29 — End: 2014-07-30
  Administered 2014-07-29 – 2014-07-30 (×2): 81 mg via ORAL
  Filled 2014-07-28 (×2): qty 1

## 2014-07-28 MED ORDER — ACETAMINOPHEN 325 MG PO TABS: 650.0000 mg | ORAL_TABLET | ORAL | Status: DC | PRN

## 2014-07-28 MED ORDER — ONDANSETRON HCL 4 MG/2ML IJ SOLN: 4.0000 mg | Freq: Three times a day (TID) | INTRAMUSCULAR | Status: AC | PRN

## 2014-07-28 MED ORDER — BENAZEPRIL HCL 10 MG PO TABS
5.0000 mg | ORAL_TABLET | Freq: Every day | ORAL | Status: DC
  Administered 2014-07-28 – 2014-07-30 (×3): 5 mg via ORAL
  Filled 2014-07-28 (×3): qty 1

## 2014-07-28 MED ORDER — TAMSULOSIN HCL 0.4 MG PO CAPS
0.4000 mg | ORAL_CAPSULE | Freq: Every day | ORAL | Status: DC
  Administered 2014-07-28 – 2014-07-29 (×2): 0.4 mg via ORAL
  Filled 2014-07-28 (×2): qty 1

## 2014-07-28 MED ORDER — MUPIROCIN 2 % EX OINT: 1.0000 "application " | TOPICAL_OINTMENT | Freq: Every day | CUTANEOUS | Status: DC

## 2014-07-28 MED ORDER — ONDANSETRON HCL 4 MG/2ML IJ SOLN: 4.0000 mg | Freq: Four times a day (QID) | INTRAMUSCULAR | Status: DC | PRN

## 2014-07-28 MED ORDER — IOHEXOL 350 MG/ML SOLN
100.0000 mL | Freq: Once | INTRAVENOUS | Status: AC | PRN
  Administered 2014-07-28: 100 mL via INTRAVENOUS

## 2014-07-28 MED ORDER — AMLODIPINE BESYLATE 5 MG PO TABS
5.0000 mg | ORAL_TABLET | Freq: Every day | ORAL | Status: DC
  Administered 2014-07-28 – 2014-07-30 (×3): 5 mg via ORAL
  Filled 2014-07-28 (×3): qty 1

## 2014-07-28 MED ORDER — PRAVASTATIN SODIUM 20 MG PO TABS
20.0000 mg | ORAL_TABLET | Freq: Every day | ORAL | Status: DC
  Administered 2014-07-28 – 2014-07-29 (×2): 20 mg via ORAL
  Filled 2014-07-28 (×2): qty 1

## 2014-07-28 MED ORDER — PIPERACILLIN-TAZOBACTAM 3.375 G IVPB
3.3750 g | Freq: Three times a day (TID) | INTRAVENOUS | Status: DC
  Administered 2014-07-28 – 2014-07-30 (×5): 3.375 g via INTRAVENOUS
  Filled 2014-07-28 (×5): qty 50

## 2014-07-28 MED ORDER — CARBIDOPA-LEVODOPA 25-100 MG PO TABS
1.0000 | ORAL_TABLET | Freq: Every day | ORAL | Status: DC
  Administered 2014-07-28 – 2014-07-29 (×2): 1 via ORAL
  Filled 2014-07-28 (×2): qty 1

## 2014-07-28 MED ORDER — MORPHINE SULFATE 4 MG/ML IJ SOLN
4.0000 mg | Freq: Once | INTRAMUSCULAR | Status: AC
  Administered 2014-07-28: 4 mg via INTRAVENOUS
  Filled 2014-07-28: qty 1

## 2014-07-28 MED ORDER — INSULIN ASPART 100 UNIT/ML ~~LOC~~ SOLN: 0.0000 [IU] | Freq: Every day | SUBCUTANEOUS | Status: DC

## 2014-07-28 MED ORDER — CARBIDOPA-LEVODOPA 25-100 MG PO TABS
2.0000 | ORAL_TABLET | ORAL | Status: DC
  Administered 2014-07-28 – 2014-07-30 (×4): 2 via ORAL
  Filled 2014-07-28 (×5): qty 2

## 2014-07-28 MED ORDER — OXYCODONE-ACETAMINOPHEN 5-325 MG PO TABS
1.0000 | ORAL_TABLET | ORAL | Status: DC | PRN
  Administered 2014-07-28 (×2): 1 via ORAL
  Filled 2014-07-28 (×2): qty 1

## 2014-07-28 MED ORDER — ENOXAPARIN SODIUM 40 MG/0.4ML ~~LOC~~ SOLN
40.0000 mg | SUBCUTANEOUS | Status: DC
  Administered 2014-07-28 – 2014-07-29 (×2): 40 mg via SUBCUTANEOUS
  Filled 2014-07-28 (×2): qty 0.4

## 2014-07-28 MED ORDER — CALCIUM CARBONATE-VITAMIN D 500-200 MG-UNIT PO TABS
1.0000 | ORAL_TABLET | Freq: Two times a day (BID) | ORAL | Status: DC
  Administered 2014-07-28 – 2014-07-30 (×5): 1 via ORAL
  Filled 2014-07-28 (×10): qty 1

## 2014-07-28 MED ORDER — GABAPENTIN 300 MG PO CAPS
300.0000 mg | ORAL_CAPSULE | Freq: Every day | ORAL | Status: DC
  Administered 2014-07-28: 300 mg via ORAL
  Filled 2014-07-28: qty 1

## 2014-07-28 MED ORDER — MORPHINE SULFATE 2 MG/ML IJ SOLN: 2.0000 mg | INTRAMUSCULAR | Status: DC | PRN

## 2014-07-28 MED ORDER — PIPERACILLIN-TAZOBACTAM 3.375 G IVPB 30 MIN
3.3750 g | Freq: Once | INTRAVENOUS | Status: AC
  Administered 2014-07-28: 3.375 g via INTRAVENOUS
  Filled 2014-07-28: qty 50

## 2014-07-28 MED ORDER — ASPIRIN 81 MG PO CHEW: 324.0000 mg | CHEWABLE_TABLET | Freq: Once | ORAL | Status: DC

## 2014-07-28 MED ORDER — INSULIN ASPART 100 UNIT/ML ~~LOC~~ SOLN
0.0000 [IU] | Freq: Three times a day (TID) | SUBCUTANEOUS | Status: DC
  Administered 2014-07-28: 1 [IU] via SUBCUTANEOUS
  Administered 2014-07-29: 3 [IU] via SUBCUTANEOUS
  Administered 2014-07-29: 2 [IU] via SUBCUTANEOUS
  Administered 2014-07-30: 3 [IU] via SUBCUTANEOUS

## 2014-07-28 NOTE — Progress Notes (Signed)
ANTIBIOTIC CONSULT NOTE - INITIAL  Pharmacy Consult for Zosyn Indication: aspiration pneumonia  Allergies  Allergen Reactions  . Caffeine Swelling    Joint swelling     Patient Measurements: Height: 6' (182.9 cm) Weight: 183 lb (83.008 kg) IBW/kg (Calculated) : 77.6  Vital Signs: Temp: 98.4 F (36.9 C) (06/04 1341) Temp Source: Oral (06/04 1341) BP: 143/72 mmHg (06/04 1341) Pulse Rate: 79 (06/04 1341) Intake/Output from previous day:   Intake/Output from this shift:    Labs:  Recent Labs  07/28/14 1105 07/28/14 1125  WBC 13.5*  --   HGB 14.7 16.0  PLT 211  --   CREATININE 0.70 0.70   Estimated Creatinine Clearance: 79.5 mL/min (by C-G formula based on Cr of 0.7). No results for input(s): VANCOTROUGH, VANCOPEAK, VANCORANDOM, GENTTROUGH, GENTPEAK, GENTRANDOM, TOBRATROUGH, TOBRAPEAK, TOBRARND, AMIKACINPEAK, AMIKACINTROU, AMIKACIN in the last 72 hours.   Microbiology: Recent Results (from the past 720 hour(s))  Culture, Urine     Status: None   Collection Time: 07/11/14  5:24 PM  Result Value Ref Range Status   Urine Culture, Routine Final report  Final   Result 1 Comment  Final    Comment: Mixed urogenital flora 10,000-25,000 colony forming units per mL     Medical History: Past Medical History  Diagnosis Date  . Type 2 diabetes mellitus with autonomic neuropathy   . Hyperlipidemia   . Hypertension   . Arthritis   . Diverticulitis   . Parkinsonism   . Right knee pain   . Memory loss   . Depression   . Gait disturbance   . Restless leg   . Thyroid disease   . Vitamin D deficiency   . Osteoarthritis   . Osteoporosis   . Dysphagia, idiopathic     History of aspiration  . Recurrent falls     Using a cane and walker  . Dizzy   . Hearing loss of both ears   . Neck pain   . Thoracic back pain   . Lumbar back pain   . Shoulder pain   . Cyanocobalamin deficiency   . Other and unspecified hyperlipidemia   . Unspecified essential hypertension   .  Abnormality of gait   . Unspecified hypothyroidism   . Restless legs syndrome (RLS)     Medications:  Scheduled:  . amLODipine  5 mg Oral Daily  . aspirin  324 mg Oral Once  . [START ON 07/29/2014] aspirin EC  81 mg Oral q morning - 10a  . benazepril  5 mg Oral Daily  . calcium-vitamin D  1 tablet Oral BID  . carbidopa-levodopa  1 tablet Oral QHS  . carbidopa-levodopa  2 tablet Oral 2 times per day  . enoxaparin (LOVENOX) injection  40 mg Subcutaneous Q24H  . gabapentin  300 mg Oral QHS  . insulin aspart  0-5 Units Subcutaneous QHS  . insulin aspart  0-9 Units Subcutaneous TID WC  . mupirocin ointment  1 application Nasal QHS  . piperacillin-tazobactam  3.375 g Intravenous Once  . piperacillin-tazobactam (ZOSYN)  IV  3.375 g Intravenous 3 times per day  . pravastatin  20 mg Oral q1800  . tamsulosin  0.4 mg Oral QPC supper   Infusions:     Assessment: 81 y.o. male with PMH DM, HTN, parkinsonism; admitted 07/28/2014 for pleuritic chest pain; ACS being ruled out.  CT, CXR clear but does have leukocytosis, endorses fevers this AM.  MD noted difficulty with drinking; to start Zosyn to r/o aspiration  PNA.  6/4 >> Zosyn >>  Tmax: currently afebrile WBCs: moderately elevated Renal: SCr wnl; CrCl 79 ml/min  No Cx   Goal of Therapy:  Eradication of infection Appropriate antibiotic dosing for indication and renal function  Plan:  Day 1 antibiotics Zosyn 3.375 g IV given once over 30 minutes, then every 8 hrs by 4-hr infusion  Follow clinical course, renal function, culture results as available  Follow for de-escalation of antibiotics and LOT.  No recent healthcare exposures or other risk factors for multi-drug-resistant organisms (i.e. Pseudomonas) noted in chart review; recommend narrowing if remains stable > 24 hrs (Unasyn, Augmentin, or Clindamycin)   Bernadene Person, PharmD Pager: [phone] 07/28/2014, 2:42 PM

## 2014-07-28 NOTE — Progress Notes (Signed)
CRITICAL VALUE ALERT  Critical value received:  Troponin- .42   Date of notification:  07/28/14  Time of notification:  11:49 PM   Critical value read back:No.  Nurse who received alert:  Eugenio HoesL Deshanda Molitor  MD notified (1st page): Hospitalist  Time of first page:  11:49 PM

## 2014-07-28 NOTE — ED Notes (Signed)
Spoke with Diane on [address]

## 2014-07-28 NOTE — ED Notes (Signed)
Called to [address] to give report to FabensSusan, Charity fundraiserN.  Dr. Isidoro Donningai has asked to have the pt get his stat CT before going up to the unit.  Darl PikesSusan is aware.

## 2014-07-28 NOTE — Progress Notes (Signed)
Asked to review EKG by ER staff. Patient with SR with wide RBBB and LAFB (bifascicular block). There is no evidence of acute infarct, his QRS has significantly abnormal depolarization and repolarization based on his underlying conduction disease, findings are not consistent with STEMI. From notes patient with constant chest pain since 4AM (over 6 hours) and tenderness to palpation in left shoulder, not consistent with cardiac chest pain. Ischemic chest pain would not last several hours, and if did have extended episode of ischemia initial trop would be positive.  Can cycle enzymes and obtain echo overnight, make NPO. If any abnormal findings on initial workup please call us back.   Dominga FerryJ Deshay Blumenfeld MD

## 2014-07-28 NOTE — ED Provider Notes (Signed)
CSN: [id number]     Arrival date & time 07/28/14  1019 History   First MD Initiated Contact with Patient 07/28/14 1032     Chief Complaint  Patient presents with  . Fever  . Hypertension     (Consider location/radiation/quality/duration/timing/severity/associated sxs/prior Treatment) HPI Comments: The patient is an 81 year old male, he has a history of diabetes, hypertension and parkinsonian disease, history of shoulder pain on the left with severe arthritis, hyperlipidemia. He presents to the hospital with his wife complaining of pain in the left chest, left back, left shoulder and left neck which started at 4:00 in the morning. He reports having chronic pain in the left shoulder, he states that the pain today is worse than that. He is unable to give a good description of what the pain feels like, it has been persistent since 4:00, not associated with shortness of breath coughing and denies fevers chills nausea or vomiting. He has been unable to get out of bed because of the pain. He has chronic weakness in his legs and chronic swelling of the right lower extremity and peripheral neuropathy thought to be related to the parkinsonian is on according to the wife. He does not have a history of coronary disease that he knows of, review of the medical record shows no signs of stress test, heart catheterization or cardiology evaluation in the last 10 years or so.  Patient is a 81 y.o. male presenting with fever and hypertension. The history is provided by the patient, the spouse and medical records.  Fever Hypertension    Past Medical History  Diagnosis Date  . Type 2 diabetes mellitus with autonomic neuropathy   . Hyperlipidemia   . Hypertension   . Arthritis   . Diverticulitis   . Parkinsonism   . Right knee pain   . Memory loss   . Depression   . Gait disturbance   . Restless leg   . Thyroid disease   . Vitamin D deficiency   . Osteoarthritis   . Osteoporosis   . Dysphagia, idiopathic      History of aspiration  . Recurrent falls     Using a cane and walker  . Dizzy   . Hearing loss of both ears   . Neck pain   . Thoracic back pain   . Lumbar back pain   . Shoulder pain   . Cyanocobalamin deficiency   . Other and unspecified hyperlipidemia   . Unspecified essential hypertension   . Abnormality of gait   . Unspecified hypothyroidism   . Restless legs syndrome (RLS)    Past Surgical History  Procedure Laterality Date  . Cataract extraction Bilateral 2012    Dr. Hazle Quant   Family History  Problem Relation Age of Onset  . Heart disease Mother   . Diabetes Mother   . Stroke Mother   . Cancer Father     prostate  . Diabetes Sister   . Heart disease Brother    History  Substance Use Topics  . Smoking status: Former Smoker    Quit date: 01/31/1974  . Smokeless tobacco: Never Used  . Alcohol Use: No    Review of Systems  Constitutional: Positive for fever.  All other systems reviewed and are negative.     Allergies  Caffeine  Home Medications   Prior to Admission medications   Medication Sig Start Date End Date Taking? Authorizing Provider  amLODipine (NORVASC) 5 MG tablet Take one tablet by mouth once daily to  control blood pressure 04/26/14  Yes [name], MD  aspirin 81 MG tablet Take 81 mg by mouth every morning.    Yes Historical Provider, MD  benazepril (LOTENSIN) 5 MG tablet Take one tablet by mouth once daily for blood pressure 04/26/14  Yes [name], MD  Blood Glucose Monitoring Suppl (FREESTYLE LITE) DEVI Use to check blood sugar once daily Dx E11.43 03/19/14  Yes [name], MD  Calcium Carbonate-Vitamin D (CALCIUM-CARB 600 + D) 600-125 MG-UNIT TABS Take 1 tablet by mouth 2 (two) times daily.    Yes Historical Provider, MD  carbidopa-levodopa (SINEMET IR) 25-100 MG per tablet Two tablets morning, two tablets in the afternoon, one tablet at night Patient taking differently: Take 1-2 tablets by mouth 3 (three) times daily. Takes  two tablets in the morning, two tablets in the afternoon, and one tablet at night 04/18/14  Yes [name], MD  cyanocobalamin (,VITAMIN B-12,) 1000 MCG/ML injection 1 mL into muscle every 30 days. DX: b12 deficiency 11/02/13  Yes Sharon SellerJessica K Eubanks, NP  gabapentin (NEURONTIN) 100 MG capsule Take 3 at supper to prevent restless legs Patient taking differently: Take 300 mg by mouth at bedtime. To prevent restless legs 04/26/14  Yes [name], MD  glipiZIDE (GLUCOTROL XL) 5 MG 24 hr tablet Take one tablet by mouth once daily to control blood sugar 04/26/14  Yes [name], MD  glucose blood (FREESTYLE LITE) test strip Use to check blood sugar once daily. Dx E11.43 03/19/14  Yes [name], MD  Lancets (FREESTYLE) lancets Use to check blood sugar once daily Dx E11.43 03/19/14  Yes [name], MD  metFORMIN (GLUCOPHAGE) 1000 MG tablet Take one tablet by mouth twice daily to control blood sugar 04/26/14  Yes [name], MD  mupirocin ointment (BACTROBAN) 2 % Place 1 application into the nose at bedtime. Applies to lesion on scrotum   Yes Historical Provider, MD  naproxen sodium (ANAPROX) 220 MG tablet Take 440 mg by mouth every 12 (twelve) hours as needed (for pain).   Yes Historical Provider, MD  pravastatin (PRAVACHOL) 20 MG tablet Take one tablet by mouth once daily to control cholesterol 05/01/14  Yes [name], MD  Syringe/Needle, Disp, (SYRINGE 3CC/25GX1") 25G X 1" 3 ML MISC Use as directed with Cyanocobalmin 03/14/13  Yes Mahima Glade LloydPandey, MD  tamsulosin (FLOMAX) 0.4 MG CAPS capsule Take one tablet by mouth once daily at bedtime to prevent urinary frequency 04/26/14  Yes [name], MD   BP 142/76 mmHg  Pulse 84  Temp(Src) 99 F (37.2 C) (Oral)  Resp 26  Ht 6' (1.829 m)  Wt 183 lb (83.008 kg)  BMI 24.81 kg/m2  SpO2 94% Physical Exam  Constitutional: He appears well-developed and well-nourished. No distress.  HENT:  Head: Normocephalic and atraumatic.  Mouth/Throat:  Oropharynx is clear and moist. No oropharyngeal exudate.  Eyes: Conjunctivae and EOM are normal. Pupils are equal, round, and reactive to light. Right eye exhibits no discharge. Left eye exhibits no discharge. No scleral icterus.  Neck: Normal range of motion. Neck supple. No JVD present. No thyromegaly present.  Cardiovascular: Normal rate, regular rhythm, normal heart sounds and intact distal pulses.  Exam reveals no gallop and no friction rub.   No murmur heard. Pulmonary/Chest: Effort normal and breath sounds normal. No respiratory distress. He has no wheezes. He has no rales. He exhibits no tenderness.  Abdominal: Soft. Bowel sounds are normal. He exhibits  no distension and no mass. There is no tenderness.  Musculoskeletal: Normal range of motion. He exhibits edema and tenderness ( Tenderness with range of motion of the left and right shoulder, left greater than right, crepitance felt in the left shoulder, no deformity or redness, no warmth).  Right lower extremity pitting edema 1+, none on the left, able to straight leg raise  Lymphadenopathy:    He has no cervical adenopathy.  Neurological: He is alert. Coordination normal.  Parkinsonian rigidity is present  Skin: Skin is warm and dry. No rash noted. No erythema.  Psychiatric: He has a normal mood and affect. His behavior is normal.  Nursing note and vitals reviewed.   ED Course  Procedures (including critical care time) Labs Review Labs Reviewed  CBC - Abnormal; Notable for the following:    WBC 13.5 (*)    All other components within normal limits  COMPREHENSIVE METABOLIC PANEL - Abnormal; Notable for the following:    Glucose, Bld 144 (*)    ALT 13 (*)    All other components within normal limits  I-STAT CHEM 8, ED - Abnormal; Notable for the following:    Glucose, Bld 151 (*)    Calcium, Ion 1.12 (*)    All other components within normal limits  PROTIME-INR  PROTIME-INR  APTT  I-STAT TROPOININ, ED    Imaging  Review Dg Chest Portable 1 View  07/28/2014   CLINICAL DATA:  Pt woke up at 4am today with hard cough that caused chest pain. Fever and HTN also. When lying still, chest pain subsides, but has pain between shoulder blades that is persistent. Hx of parkinsons.  EXAM: PORTABLE CHEST - 1 VIEW  COMPARISON:  11/04/2012  FINDINGS: Cardiac silhouette borderline enlarged. Aorta is uncoiled. No mediastinal or hilar masses. Lung volumes are relatively low. Allowing for this, there is no evidence of pulmonary edema or lung consolidation. No pleural effusion or pneumothorax.  Bony thorax grossly intact.  IMPRESSION: No acute cardiopulmonary disease.   Electronically Signed   By: Amie Portland M.D.   On: 07/28/2014 11:35     EKG Interpretation   Date/Time:  Saturday July 28 2014 10:38:02 EDT Ventricular Rate:  82 PR Interval:  188 QRS Duration: 156 QT Interval:  412 QTC Calculation: 481 R Axis:   -80 Text Interpretation:  Sinus rhythm RBBB and LAFB Lateral ST elevation,  more than on prior Abnormal ekg Confirmed by Haydee Jabbour  MD, Latyra Jaye ([id number]) on  07/28/2014 10:50:00 AM      MDM   Final diagnoses:  Chest pain, unspecified chest pain type    The patient has an abnormal EKG, he has a right bundle branch block, this has been seen on prior EKGs however he now has some ST elevations in the lateral precordial leads which are abnormal, I do not see any reciprocal changes, I will discuss these findings with the cardiologist, in that time cardiology workup will ensue with chest x-ray and blood work, aspirin 4 baby aspirin were given prior to arrival by his wife.  dw Dr. Wyline Mood who agrees not a STEMI, Will admit for r/o  Dr. Isidoro Donning to admit.   Eber Hong, MD 07/28/14 1215

## 2014-07-28 NOTE — ED Notes (Signed)
Pt is being transported to CT on stretcher now.

## 2014-07-28 NOTE — ED Notes (Addendum)
Per ems report:  Pt woke up at 0400 today with hard cough that caused chest pain.  When lying still, chest pain subsides, but has pain between shoulder blades that is persistent.  T=101.0 on scene.  Had taken a total of 2 "full doses" of asa pta.  Was given NS via #18 RT wrist.  Has hx of aspiration d/t parkinsons.  LS clear by EMS.  152/89, 85, 28-32,  95% RA, CBG 116. EKG SR w/RBBB,  Axox3 and is baseline.  No reports of N/V/D

## 2014-07-28 NOTE — H&P (Signed)
History and Physical        Hospital Admission Note Date: 07/28/2014  Patient name: Nicholas Barnett Medical record number: [id number] Date of birth: 10-May-1931 Age: 81 y.o. Gender: male  PCP: GREEN, Lenon Curt, MD  Referring physician: Dr. Hyacinth Meeker  Chief Complaint:  Chest pain, fever  HPI: Patient is a 81 year old male, with diabetes, hypertension, parkinsonism, chronic shoulder pain, hypertension, hyperlipidemia, gait abnormality presented to ED with severe chest pain and back pain, shoulder pain and neck pain since 4 AM in the morning. History was obtained from the patient, his wife and daughter in the room. Patient appears to be somewhat lethargic however has just received morphine prior to my encounter. Patient's wife reported that he woke up at 4 AM in the morning and complained about the pain, the chest pain described as pleuritic, worse with deep breathing, 7/10 also more in the back and between the shoulder blades, sharp and constant. He denied any shortness of breath or coughing or URI symptoms. Patient's daughter however reported that he had low-grade fever. He was unable to get out of the bed due to pain. Patient also reported chronic weakness in his legs and has obtained physical therapy for that.  The patient reports no prior history of coronary artery disease. Wife also reported aspiration and dysphagia due to parkinsonism. EDP recommendations noted, troponin point of care negative, CBC showed a white count of 13.5, BMET unremarkable EKG however showed right bundle branch block, LAFB, rate 82, normal sinus rhythm, repolarization changes no prior EKG to compare with. EDP discussed with Dr. Branch/cardiology, recommended admission for observation  Review of Systems:  Constitutional: Denies fever, chills, diaphoresis, poor appetite and fatigue.  HEENT: Denies photophobia, eye  pain, redness, hearing loss, ear pain, congestion, sore throat, rhinorrhea, sneezing, mouth sores, trouble swallowing, neck pain, neck stiffness and tinnitus.   Respiratory: Denies SOB, DOE, cough,  and wheezing.   Cardiovascular: Denies palpitation, has chronic leg swelling, more on the right. + chest pain as per HPI Gastrointestinal: Denies nausea, vomiting, abdominal pain, diarrhea, constipation, blood in stool and abdominal distention.  Genitourinary: Denies dysuria, urgency, frequency, hematuria, flank pain and difficulty urinating.  Musculoskeletal: Patient has chronic back pain, shoulder pain, worse on the left, (due to bursitis) Skin: Denies pallor, rash and wound.  Neurological: Denies dizziness, seizures, syncope, weakness, light-headedness, numbness and headaches.  Hematological: Denies adenopathy. Easy bruising, personal or family bleeding history  Psychiatric/Behavioral: Denies suicidal ideation, mood changes, confusion, nervousness, sleep disturbance and agitation  Past Medical History: Past Medical History  Diagnosis Date  . Type 2 diabetes mellitus with autonomic neuropathy   . Hyperlipidemia   . Hypertension   . Arthritis   . Diverticulitis   . Parkinsonism   . Right knee pain   . Memory loss   . Depression   . Gait disturbance   . Restless leg   . Thyroid disease   . Vitamin D deficiency   . Osteoarthritis   . Osteoporosis   . Dysphagia, idiopathic     History of aspiration  . Recurrent falls     Using a cane and walker  . Dizzy   . Hearing loss of  both ears   . Neck pain   . Thoracic back pain   . Lumbar back pain   . Shoulder pain   . Cyanocobalamin deficiency   . Other and unspecified hyperlipidemia   . Unspecified essential hypertension   . Abnormality of gait   . Unspecified hypothyroidism   . Restless legs syndrome (RLS)     Past Surgical History  Procedure Laterality Date  . Cataract extraction Bilateral 2012    Dr. Hazle Quantigby     Medications: Prior to Admission medications   Medication Sig Start Date End Date Taking? Authorizing Provider  amLODipine (NORVASC) 5 MG tablet Take one tablet by mouth once daily to control blood pressure 04/26/14  Yes [name], MD  aspirin 81 MG tablet Take 81 mg by mouth every morning.    Yes Historical Provider, MD  benazepril (LOTENSIN) 5 MG tablet Take one tablet by mouth once daily for blood pressure 04/26/14  Yes [name], MD  Blood Glucose Monitoring Suppl (FREESTYLE LITE) DEVI Use to check blood sugar once daily Dx E11.43 03/19/14  Yes [name], MD  Calcium Carbonate-Vitamin D (CALCIUM-CARB 600 + D) 600-125 MG-UNIT TABS Take 1 tablet by mouth 2 (two) times daily.    Yes Historical Provider, MD  carbidopa-levodopa (SINEMET IR) 25-100 MG per tablet Two tablets morning, two tablets in the afternoon, one tablet at night Patient taking differently: Take 1-2 tablets by mouth 3 (three) times daily. Takes two tablets in the morning, two tablets in the afternoon, and one tablet at night 04/18/14  Yes [name], MD  cyanocobalamin (,VITAMIN B-12,) 1000 MCG/ML injection 1 mL into muscle every 30 days. DX: b12 deficiency 11/02/13  Yes Sharon SellerJessica K Eubanks, NP  gabapentin (NEURONTIN) 100 MG capsule Take 3 at supper to prevent restless legs Patient taking differently: Take 300 mg by mouth at bedtime. To prevent restless legs 04/26/14  Yes [name], MD  glipiZIDE (GLUCOTROL XL) 5 MG 24 hr tablet Take one tablet by mouth once daily to control blood sugar 04/26/14  Yes [name], MD  glucose blood (FREESTYLE LITE) test strip Use to check blood sugar once daily. Dx E11.43 03/19/14  Yes [name], MD  Lancets (FREESTYLE) lancets Use to check blood sugar once daily Dx E11.43 03/19/14  Yes [name], MD  metFORMIN (GLUCOPHAGE) 1000 MG tablet Take one tablet by mouth twice daily to control blood sugar 04/26/14  Yes [name], MD  mupirocin ointment (BACTROBAN) 2 % Place 1  application into the nose at bedtime. Applies to lesion on scrotum   Yes Historical Provider, MD  naproxen sodium (ANAPROX) 220 MG tablet Take 440 mg by mouth every 12 (twelve) hours as needed (for pain).   Yes Historical Provider, MD  pravastatin (PRAVACHOL) 20 MG tablet Take one tablet by mouth once daily to control cholesterol 05/01/14  Yes [name], MD  Syringe/Needle, Disp, (SYRINGE 3CC/25GX1") 25G X 1" 3 ML MISC Use as directed with Cyanocobalmin 03/14/13  Yes Mahima Glade LloydPandey, MD  tamsulosin (FLOMAX) 0.4 MG CAPS capsule Take one tablet by mouth once daily at bedtime to prevent urinary frequency 04/26/14  Yes [name], MD    Allergies:   Allergies  Allergen Reactions  . Caffeine Swelling    Joint swelling     Social History:  reports that he quit smoking about 40 years ago. He has never used smokeless tobacco. He reports that he does not drink alcohol  or use illicit drugs. currently lives at home with his wife, ambulates with a cane  Family History: Family History  Problem Relation Age of Onset  . Heart disease Mother   . Diabetes Mother   . Stroke Mother   . Cancer Father     prostate  . Diabetes Sister   . Heart disease Brother     Physical Exam: Blood pressure 142/76, pulse 84, temperature 99 F (37.2 C), temperature source Oral, resp. rate 26, height 6' (1.829 m), weight 83.008 kg (183 lb), SpO2 94 %. General: awake, oriented x3, in no acute distress, somewhat lethargic. HEENT: normocephalic, atraumatic, anicteric sclera, pink conjunctiva, pupils equal and reactive to light and accomodation, oropharynx clear Neck: supple, no masses or lymphadenopathy, no goiter, no bruits  Heart: Regular rate and rhythm, without murmurs, rubs or gallops. Lungs: Clear to auscultation bilaterally, no wheezing, rales or rhonchi. Abdomen: Soft, nontender, nondistended, positive bowel sounds, no masses. Extremities: No clubbing, cyanosis. 1+ edema on the right lower extremity Neuro:  Grossly intact, no focal neurological deficits, strength 5/5 upper and lower extremities on the left, 3-4/5 on the right lower extremity, per patient's wife at the bedside, chronic. Upper extremity on the right 5/5 Psych:  normal mood and affect Skin: no rashes or lesions, warm and dry   LABS on Admission:  Basic Metabolic Panel:  Recent Labs Lab 07/28/14 1105 07/28/14 1125  NA 140 141  K 3.7 3.6  CL 105 104  CO2 24  --   GLUCOSE 144* 151*  BUN 18 19  CREATININE 0.70 0.70  CALCIUM 9.0  --    Liver Function Tests:  Recent Labs Lab 07/28/14 1105  AST 23  ALT 13*  ALKPHOS 71  BILITOT 0.8  PROT 6.8  ALBUMIN 3.8   No results for input(s): LIPASE, AMYLASE in the last 168 hours. No results for input(s): AMMONIA in the last 168 hours. CBC:  Recent Labs Lab 07/28/14 1105 07/28/14 1125  WBC 13.5*  --   HGB 14.7 16.0  HCT 43.6 47.0  MCV 94.0  --   PLT 211  --    Cardiac Enzymes: No results for input(s): CKTOTAL, CKMB, CKMBINDEX, TROPONINI in the last 168 hours. BNP: Invalid input(s): POCBNP CBG: No results for input(s): GLUCAP in the last 168 hours.  Radiological Exams on Admission:  Dg Chest Portable 1 View  07/28/2014   CLINICAL DATA:  Pt woke up at 4am today with hard cough that caused chest pain. Fever and HTN also. When lying still, chest pain subsides, but has pain between shoulder blades that is persistent. Hx of parkinsons.  EXAM: PORTABLE CHEST - 1 VIEW  COMPARISON:  11/04/2012  FINDINGS: Cardiac silhouette borderline enlarged. Aorta is uncoiled. No mediastinal or hilar masses. Lung volumes are relatively low. Allowing for this, there is no evidence of pulmonary edema or lung consolidation. No pleural effusion or pneumothorax.  Bony thorax grossly intact.  IMPRESSION: No acute cardiopulmonary disease.   Electronically Signed   By: Amie Portland M.D.   On: 07/28/2014 11:35    *I have personally reviewed the images above*  EKG: Independently reviewed. Rate 88,  right bundle branch block, LAFB, repolarization changes, no prior EKG to compare with   Assessment/Plan Principal Problem:   Chest pain, atypical with some pleuritic features: Sudden onset, this morning, also has back pain and shoulder pain (chronic) - Admit for observation/telemetry, rule out acute ACS, obtain serial cardiac enzymes. I discussed in detail with Dr. Wyline Mood and reviewed  EKG, Cardiology do not feel that patient has acute ACS due to no reciprocal changes or ischemic changes. Cardiology recommended to rule out and to obtain 2-D echo and to call again if troponins are positive. - Due to patient's limited ambulatory status due to Parkinsonism, sudden onset of pleuritic chest pain, asymmetrical pedal edema, rule out PE, ordered stat CT angiogram chest, Doppler ultrasound of the lower extremities - Could be referred pain from his shoulders (will obtain x-ray of the right and left shoulders) - Chest x-ray showed no pneumonia. However per wife, had fever this morning, has leukocytosis. I noticed coughing during my encounter after patient had water, ordered swallow evaluation, for now placed on IV Zosyn.   Active Problems:   Essential hypertension - Continue benazepril, Norvasc    Parkinson's plus syndrome -Continue Sinemet     Dysphagia, idiopathic - Obtain swallow evaluation, place on aspiration precautions    Recurrent falls - PTOT evaluation once stable    Shoulder pain Bilateral - Follow shoulder x-ray, continue pain control    Type 2 diabetes mellitus with autonomic neuropathy - Place on sliding scale insulin, obtain hemoglobin A1c    Abnormal EKG -Repeat EKG in a.m., rule out acute ACS    DVT prophylaxis:  Lovenox   CODE STATUS:  full CODE STATUS, discussed with the patient   Family Communication: Admission, patients condition and plan of care including tests being ordered have been discussed with the patient and  Wife and daughter who indicates understanding and  agree with the plan and Code Status  Disposition plan: Further plan will depend as patient's clinical course evolves and further radiologic and laboratory data become available.   Time Spent on Admission: 60 mins   Chontel Warning M.D. [facility] 07/28/2014, 12:58 PM Pager: [phone]  If 7PM-7AM, please contact night-coverage [email or web address] Password [email or web address]

## 2014-07-28 NOTE — ED Notes (Signed)
Bed: [id number] Expected date: 07/28/14 Expected time: 10:01 AM Means of arrival: Ambulance Comments: Fever, HTN, multiple complaints

## 2014-07-29 ENCOUNTER — Encounter (HOSPITAL_COMMUNITY): Payer: Self-pay | Admitting: Internal Medicine

## 2014-07-29 ENCOUNTER — Observation Stay (HOSPITAL_COMMUNITY): Payer: PPO

## 2014-07-29 DIAGNOSIS — I10 Essential (primary) hypertension: Secondary | ICD-10-CM | POA: Diagnosis not present

## 2014-07-29 DIAGNOSIS — G238 Other specified degenerative diseases of basal ganglia: Secondary | ICD-10-CM | POA: Diagnosis not present

## 2014-07-29 DIAGNOSIS — R079 Chest pain, unspecified: Secondary | ICD-10-CM | POA: Diagnosis not present

## 2014-07-29 DIAGNOSIS — R9431 Abnormal electrocardiogram [ECG] [EKG]: Secondary | ICD-10-CM | POA: Diagnosis not present

## 2014-07-29 DIAGNOSIS — M19012 Primary osteoarthritis, left shoulder: Secondary | ICD-10-CM | POA: Diagnosis not present

## 2014-07-29 DIAGNOSIS — R1310 Dysphagia, unspecified: Secondary | ICD-10-CM | POA: Diagnosis not present

## 2014-07-29 DIAGNOSIS — M7989 Other specified soft tissue disorders: Secondary | ICD-10-CM | POA: Diagnosis not present

## 2014-07-29 DIAGNOSIS — M19011 Primary osteoarthritis, right shoulder: Secondary | ICD-10-CM | POA: Diagnosis not present

## 2014-07-29 DIAGNOSIS — R0789 Other chest pain: Secondary | ICD-10-CM | POA: Diagnosis not present

## 2014-07-29 LAB — GLUCOSE, CAPILLARY
GLUCOSE-CAPILLARY: 177 mg/dL — AB (ref 65–99)
Glucose-Capillary: 117 mg/dL — ABNORMAL HIGH (ref 65–99)
Glucose-Capillary: 216 mg/dL — ABNORMAL HIGH (ref 65–99)
Glucose-Capillary: 127 mg/dL — ABNORMAL HIGH (ref 65–99)

## 2014-07-29 LAB — TROPONIN I: TROPONIN I: 0.24 ng/mL — AB (ref <0.031)

## 2014-07-29 MED ORDER — KETOROLAC TROMETHAMINE 30 MG/ML IJ SOLN
30.0000 mg | Freq: Once | INTRAMUSCULAR | Status: AC
  Administered 2014-07-29: 30 mg via INTRAVENOUS
  Filled 2014-07-29: qty 1

## 2014-07-29 MED ORDER — KETOROLAC TROMETHAMINE 15 MG/ML IJ SOLN
15.0000 mg | Freq: Four times a day (QID) | INTRAMUSCULAR | Status: DC
  Administered 2014-07-29 – 2014-07-30 (×4): 15 mg via INTRAVENOUS
  Filled 2014-07-29 (×4): qty 1

## 2014-07-29 MED ORDER — GABAPENTIN 300 MG PO CAPS
300.0000 mg | ORAL_CAPSULE | Freq: Two times a day (BID) | ORAL | Status: DC
  Administered 2014-07-29 – 2014-07-30 (×3): 300 mg via ORAL
  Filled 2014-07-29 (×3): qty 1

## 2014-07-29 MED ORDER — PANTOPRAZOLE SODIUM 40 MG PO TBEC
40.0000 mg | DELAYED_RELEASE_TABLET | Freq: Two times a day (BID) | ORAL | Status: DC
  Administered 2014-07-29 – 2014-07-30 (×3): 40 mg via ORAL
  Filled 2014-07-29 (×3): qty 1

## 2014-07-29 MED ORDER — METHOCARBAMOL 1000 MG/10ML IJ SOLN
500.0000 mg | Freq: Four times a day (QID) | INTRAVENOUS | Status: DC
  Administered 2014-07-29 – 2014-07-30 (×4): 500 mg via INTRAVENOUS
  Filled 2014-07-29 (×7): qty 5

## 2014-07-29 MED ORDER — RESOURCE THICKENUP CLEAR PO POWD
ORAL | Status: DC | PRN
  Filled 2014-07-29: qty 125

## 2014-07-29 NOTE — Evaluation (Signed)
Physical Therapy Evaluation Patient Details Name: Nicholas Barnett MRN: [id number] DOB: 12/12/1931 Today's Date: 07/29/2014   History of Present Illness  81 yo male admitted with chest pain, weakness. Hx of Parkinson's, asp precautions, dm, htn, bil shoulder arthritis, chronic LE edema, neuropathy, osteoporosis.   Clinical Impression  On eval, pt was Min-Mod assist for mobility-able to ambulate ~75 feet with RW. Unsteady during session. Parkinson's gait pattern noted during ambulation. Pt may be able to d/c home if family feels they can manage his care. If not, then will need to consider ST rehab at [facility]     Follow Up Recommendations Home health PT;Supervision/Assistance - 24 hour vs SNF (as long as family can manage at home. If family is unable, then may need to consider SNF. )    Equipment Recommendations  None recommended by PT    Recommendations for Other Services       Precautions / Restrictions Precautions Precautions: Fall Restrictions Weight Bearing Restrictions: No      Mobility  Bed Mobility               General bed mobility comments: Assist for LEs onto bed. Increased time.   Transfers Overall transfer level: Needs assistance Equipment used: Rolling walker (2 wheeled) Transfers: Sit to/from Stand Sit to Stand: Mod assist         General transfer comment: Mod assist from low surface, Min guard from elevated bed. VCs safety, technique, hand placement.   Ambulation/Gait Ambulation/Gait assistance: Min assist Ambulation Distance (Feet): 75 Feet Assistive device: Rolling walker (2 wheeled) Gait Pattern/deviations: Trunk flexed;Festinating;Shuffle;Decreased stride length     General Gait Details: Parkinson's gait pattern evident intermittently during ambulation. VCs for posture, step length. Increased difficulty with turning.   Stairs            Wheelchair Mobility    Modified Rankin (Stroke Patients Only)       Balance Overall balance  assessment: Needs assistance         Standing balance support: Bilateral upper extremity supported;During functional activity Standing balance-Leahy Scale: Poor                               Pertinent Vitals/Pain Pain Assessment: 0-10 Pain Score: 1     Home Living Family/patient expects to be discharged to:: Private residence Living Arrangements: Spouse/significant other Available Help at Discharge: Family Type of Home: House Home Access: Stairs to enter Entrance Stairs-Rails: Right Entrance Stairs-Number of Steps: 2 Home Layout: Two level;Able to live on main level with bedroom/bathroom Home Equipment: Dan Humphreys - 2 wheels;Cane - single point;Grab bars - tub/shower      Prior Function Level of Independence: Needs assistance   Gait / Transfers Assistance Needed: uses cane inside home. golf cart outside of home  ADL's / Homemaking Assistance Needed: wife has to assist some days        Hand Dominance        Extremity/Trunk Assessment   Upper Extremity Assessment: Generalized weakness           Lower Extremity Assessment: Generalized weakness      Cervical / Trunk Assessment: Kyphotic  Communication   Communication: HOH  Cognition Arousal/Alertness: Awake/alert Behavior During Therapy: WFL for tasks assessed/performed Overall Cognitive Status: History of cognitive impairments - at baseline (slow to process)                      General Comments  Exercises        Assessment/Plan    PT Assessment Patient needs continued PT services  PT Diagnosis Difficulty walking;Abnormality of gait;Generalized weakness;Altered mental status   PT Problem List Decreased strength;Decreased activity tolerance;Decreased balance;Decreased mobility;Decreased knowledge of use of DME;Decreased cognition  PT Treatment Interventions DME instruction;Gait training;Functional mobility training;Therapeutic activities;Therapeutic exercise;Patient/family  education;Balance training   PT Goals (Current goals can be found in the Care Plan section) Acute Rehab PT Goals Patient Stated Goal: none stated PT Goal Formulation: With patient/family Time For Goal Achievement: 08/12/14 Potential to Achieve Goals: Fair    Frequency Min 3X/week   Barriers to discharge        Co-evaluation               End of Session Equipment Utilized During Treatment: Gait belt Activity Tolerance: Patient tolerated treatment well Patient left: in bed;with call bell/phone within reach;with family/visitor present           Time: 1510-1537 PT Time Calculation (min) (ACUTE ONLY): 27 min   Charges:   PT Evaluation $Initial PT Evaluation Tier I: 1 Procedure PT Treatments $Gait Training: 8-22 mins   PT G Codes:        Nicholas Barnett, MPT Pager: [phone]

## 2014-07-29 NOTE — Progress Notes (Signed)
[facility]                                                                              Patient Demographics  Nicholas Barnett, is a 81 y.o. male, DOB - 02/03/1932, ZOX:[id number]  Admit date - 07/28/2014   Admitting Physician Ripudeep Jenna Luo, MD  Outpatient Primary MD for the patient is GREEN, Lenon Curt, MD  LOS -    Chief Complaint  Patient presents with  . Fever  . Hypertension       Brief HPI    Patient is a 81 year old male, with diabetes, hypertension, parkinsonism, chronic shoulder pain, hypertension, hyperlipidemia, gait abnormality presented to ED with severe chest pain and back pain, shoulder pain and neck pain since 4 AM in the morning. History was obtained from the patient, his wife and daughter in the room. Patient appears to be somewhat lethargic however has just received morphine prior to my encounter. Patient's wife reported that he woke up at 4 AM in the morning and complained about the pain, the chest pain described as pleuritic, worse with deep breathing, 7/10 also more in the back and between the shoulder blades, sharp and constant. He denied any shortness of breath or coughing or URI symptoms. Patient's daughter however reported that he had low-grade fever. He was unable to get out of the bed due to pain. Patient also reported chronic weakness in his legs and has obtained physical therapy for that.  The patient reports no prior history of coronary artery disease. Wife also reported aspiration and dysphagia due to parkinsonism. EDP recommendations noted, troponin point of care negative, CBC showed a white count of 13.5, BMET unremarkable EKG however showed right bundle branch block, LAFB, rate 82, normal sinus rhythm, repolarization changes no prior EKG to compare with. EDP discussed with Dr. Branch/cardiology, recommended admission for observation  Assessment & Plan    Principal Problem:  Chest pain, atypical with some pleuritic features and  musculoskeletal: Sudden onset, constant, also has has back pain and shoulder pain (chronic) - Troponin slightly positive, cardiology consulted, do not feel acute ACS, follow 2-D echocardiogram, if low risk, no further cardiac workup. CT angiogram of the chest negative for acute PE, Doppler ultrasound negative for DVT - Could be referred pain from his shoulders, both shoulder x-ray shows severe degenerative osteoarthritis, thoracic spine x-ray negative for any intravertebral fracture -  will place on Toradol for 24 hours, Robaxin for pain control with PPI for musculoskeletal component of the pain  Active Problems: Aspiration pneumonitis with prior dysphagia - Per daughter, patient has been evaluated by speech therapy in the past and has aspiration, he does not follow diet recommendations, he was recommended to in the past - Place on dysphagia 3 diet with nectar thick liquids, continue IV Zosyn for now - Speech therapy for evaluation   Essential hypertension - Continue benazepril, Norvasc   Parkinson's plus syndrome -Continue Sinemet   Recurrent falls - PTOT evaluation once stable   Shoulder pain Bilateral - Both shoulders with severe degenerative osteoarthritis   Type 2 diabetes mellitus with autonomic neuropathy - Place on sliding scale insulin, obtain hemoglobin A1c  Code Status:   Family Communication: Discussed in detail with the patient, all imaging results, lab results explained to the patient , daughter and wife at the bedside   Disposition Plan: Not medically ready  Time Spent in minutes  25 minutes  Procedures  CT angiogram of the chest Doppler ultrasound of the lower extremity X-rays of the shoulders X-ray thoracic spine  Consults   Cardiology  DVT Prophylaxis  Lovenox  Medications  Scheduled Meds: . amLODipine  5 mg Oral Daily  . aspirin  324 mg Oral Once  . aspirin EC  81 mg Oral q morning - 10a  . benazepril  5 mg Oral Daily  . calcium-vitamin  D  1 tablet Oral BID  . carbidopa-levodopa  1 tablet Oral QHS  . carbidopa-levodopa  2 tablet Oral 2 times per day  . enoxaparin (LOVENOX) injection  40 mg Subcutaneous Q24H  . gabapentin  300 mg Oral BID  . insulin aspart  0-5 Units Subcutaneous QHS  . insulin aspart  0-9 Units Subcutaneous TID WC  . ketorolac  15 mg Intravenous 4 times per day  . methocarbamol (ROBAXIN)  IV  500 mg Intravenous Q6H  . mupirocin ointment  1 application Nasal QHS  . pantoprazole  40 mg Oral BID  . piperacillin-tazobactam (ZOSYN)  IV  3.375 g Intravenous 3 times per day  . pravastatin  20 mg Oral q1800  . tamsulosin  0.4 mg Oral QPC supper   Continuous Infusions:  PRN Meds:.acetaminophen, morphine injection, nitroGLYCERIN, ondansetron (ZOFRAN) IV, oxyCODONE-acetaminophen   Antibiotics   Anti-infectives    Start     Dose/Rate Route Frequency Ordered Stop   07/28/14 2100  piperacillin-tazobactam (ZOSYN) IVPB 3.375 g     3.375 g 12.5 mL/hr over 240 Minutes Intravenous 3 times per day 07/28/14 1412     07/28/14 1430  piperacillin-tazobactam (ZOSYN) IVPB 3.375 g     3.375 g 100 mL/hr over 30 Minutes Intravenous  Once 07/28/14 1412 07/28/14 1500        Subjective:   Bristol Soy was seen and examined today. Much more alert and awake today however still complaining of pleuritic chest pain and with movement, shoulder pain.  Patient denies dizziness, abdominal pain, N/V/D/C, new weakness, numbess, tingling. No acute events overnight.    Objective:   Blood pressure 140/73, pulse 66, temperature 98.1 F (36.7 C), temperature source Oral, resp. rate 22, height 6' (1.829 m), weight 83.008 kg (183 lb), SpO2 94 %.  Wt Readings from Last 3 Encounters:  07/28/14 83.008 kg (183 lb)  05/15/14 83.915 kg (185 lb)  04/18/14 82.827 kg (182 lb 9.6 oz)     Intake/Output Summary (Last 24 hours) at 07/29/14 1211 Last data filed at 07/29/14 0704  Gross per 24 hour  Intake    270 ml  Output    100 ml  Net     170 ml    Exam  General: Alert and oriented x 3, NAD, overall weak  HEENT:  PERRLA, EOMI, Anicteric Sclera, mucous membranes moist.   Neck: Supple, no JVD, no masses  CVS: S1 S2 auscultated, no rubs, murmurs or gallops. Regular rate and rhythm.  Respiratory: Clear to auscultation bilaterally, no wheezing, rales or rhonchi  Abdomen: Soft, nontender, nondistended, + bowel sounds  Ext: no cyanosis clubbing or edema  Neuro: no new deficits  Skin: No rashes  Psych: Normal affect and demeanor, alert and oriented x3    Data Review   Micro Results No results found  for this or any previous visit (from the past 240 hour(s)).  Radiology Reports Dg Thoracic Spine W/swimmers  07/28/2014   CLINICAL DATA:  Chronic bilateral shoulder pain and upper back pain for 6 weeks  EXAM: THORACIC SPINE - 2 VIEW + SWIMMERS  COMPARISON:  None.  FINDINGS: Normal alignment of the thoracic vertebral bodies. There is mild endplate spurring in the lower thoracic spine. No acute loss vertebral body height and disc height. Normal paraspinal lines. Chronic bronchitic markings in the lungs.  IMPRESSION: 1. No acute findings of the thoracic spine. 2. Chronic bronchitic markings in the lungs.   Electronically Signed   By: Genevive BiStewart  Edmunds M.D.   On: 07/28/2014 16:56   Dg Shoulder Right  07/28/2014   CLINICAL DATA:  Bilateral shoulder pain.  No injury  EXAM: RIGHT SHOULDER - 2+ VIEW  COMPARISON:  Radiograph 11/04/2012  FINDINGS: There deformity of the right humeral head with a large inferior osteophyte. There is loss of joint space the glenohumeral joint with associated sclerosis. There several calcific bodies in the shoulder bursa. No acute findings.  IMPRESSION: Severe degenerative change of the right shoulder joint.   Electronically Signed   By: Genevive BiStewart  Edmunds M.D.   On: 07/28/2014 16:51   Ct Angio Chest Pe W/cm &/or Wo Cm  07/28/2014   CLINICAL DATA:  Acute onset left-sided chest pain radiating to back and  shoulder.  EXAM: CT ANGIOGRAPHY CHEST WITH CONTRAST  TECHNIQUE: Multidetector CT imaging of the chest was performed using the standard protocol during bolus administration of intravenous contrast. Multiplanar CT image reconstructions and MIPs were obtained to evaluate the vascular anatomy.  CONTRAST:  100mL OMNIPAQUE IOHEXOL 350 MG/ML SOLN  COMPARISON:  None.  FINDINGS: Mediastinum/Lymph Nodes: Satisfactory opacification of pulmonary arteries is noted, and no pulmonary embolism identified. Some respiratory motion artifact noted. Mild cardiomegaly and coronary artery calcification noted. No evidence of thoracic aortic dissection. Mild ascending thoracic aortic aneurysm is seen measuring 4.3 cm in maximum diameter.  No masses or pathologically enlarged lymph nodes identified.  Lungs/Pleura: Mild dependent atelectasis seen bilaterally. No evidence of pulmonary consolidation or mass. No effusion present. A subpleural pulmonary nodule is seen in the lingula on image 57 which measures 5 mm.  Musculoskeletal/Soft Tissues: No suspicious bone lesions or other significant chest wall abnormality.  Upper Abdomen: Tiny calcified gallstones incidentally noted, without signs of acute cholecystitis although gallbladder is incompletely visualized on this exam.  Review of the MIP images confirms the above findings.  IMPRESSION: No evidence of acute pulmonary embolism, thoracic aortic dissection, or other acute findings.  4.3 cm ascending thoracic aortic aneurysm. Recommend annual imaging followup by CTA or MRA. This recommendation follows 2010 ACCF/AHA/AATS/ACR/ASA/SCA/SCAI/SIR/STS/SVM Guidelines for the Diagnosis and Management of Patients with Thoracic Aortic Disease. Circulation. 2010; 121: [id number].  5 mm indeterminate subpleural pulmonary nodule in the lingula. If the patient is at high risk for bronchogenic carcinoma, follow-up chest CT at 6-12 months is recommended. If the patient is at low risk for bronchogenic carcinoma,  follow-up chest CT at 12 months is recommended. This recommendation follows the consensus statement: Guidelines for Management of Small Pulmonary Nodules Detected on CT Scans: A Statement from the Fleischner Society as published in Radiology 2005;237:395-400.  Cholelithiasis. No definite radiographic evidence of cholecystitis although gallbladder is incompletely visualized on this exam. Recommend clinical correlation, and consider nuclear medicine hepatobiliary scan for further evaluation if clinically warranted.   Electronically Signed   By: Myles RosenthalJohn  Stahl M.D.   On: 07/28/2014 14:37  Dg Chest Portable 1 View  07/28/2014   CLINICAL DATA:  Pt woke up at 4am today with hard cough that caused chest pain. Fever and HTN also. When lying still, chest pain subsides, but has pain between shoulder blades that is persistent. Hx of parkinsons.  EXAM: PORTABLE CHEST - 1 VIEW  COMPARISON:  11/04/2012  FINDINGS: Cardiac silhouette borderline enlarged. Aorta is uncoiled. No mediastinal or hilar masses. Lung volumes are relatively low. Allowing for this, there is no evidence of pulmonary edema or lung consolidation. No pleural effusion or pneumothorax.  Bony thorax grossly intact.  IMPRESSION: No acute cardiopulmonary disease.   Electronically Signed   By: Amie Portland M.D.   On: 07/28/2014 11:35   Dg Shoulder Left  07/28/2014   CLINICAL DATA:  Chronic bilateral shoulder and upper back pain for 6 weeks  EXAM: LEFT SHOULDER - 2+ VIEW  COMPARISON:  Radiograph 11/04/2012  FINDINGS: No fracture dislocation of the left shoulder. There is osteophytosis of the humeral head glenohumeral joint. There is a large loose body inferior to the humeral head.  IMPRESSION: No acute findings. Moderate to severe osteoarthritis of the left shoulder. Similar findings to prior.   Electronically Signed   By: Genevive Bi M.D.   On: 07/28/2014 16:53    CBC  Recent Labs Lab 07/28/14 1105 07/28/14 1125 07/28/14 1517  WBC 13.5*  --  14.2*    HGB 14.7 16.0 14.7  HCT 43.6 47.0 43.9  PLT 211  --  200  MCV 94.0  --  92.0  MCH 31.7  --  30.8  MCHC 33.7  --  33.5  RDW 13.2  --  13.1    Chemistries   Recent Labs Lab 07/28/14 1105 07/28/14 1125 07/28/14 1517  NA 140 141  --   K 3.7 3.6  --   CL 105 104  --   CO2 24  --   --   GLUCOSE 144* 151*  --   BUN 18 19  --   CREATININE 0.70 0.70 0.74  CALCIUM 9.0  --   --   AST 23  --   --   ALT 13*  --   --   ALKPHOS 71  --   --   BILITOT 0.8  --   --    ------------------------------------------------------------------------------------------------------------------ estimated creatinine clearance is 79.5 mL/min (by C-G formula based on Cr of 0.74). ------------------------------------------------------------------------------------------------------------------ No results for input(s): HGBA1C in the last 72 hours. ------------------------------------------------------------------------------------------------------------------ No results for input(s): CHOL, HDL, LDLCALC, TRIG, CHOLHDL, LDLDIRECT in the last 72 hours. ------------------------------------------------------------------------------------------------------------------ No results for input(s): TSH, T4TOTAL, T3FREE, THYROIDAB in the last 72 hours.  Invalid input(s): FREET3 ------------------------------------------------------------------------------------------------------------------ No results for input(s): VITAMINB12, FOLATE, FERRITIN, TIBC, IRON, RETICCTPCT in the last 72 hours.  Coagulation profile  Recent Labs Lab 07/28/14 1105 07/28/14 1149  INR 1.03 1.07    No results for input(s): DDIMER in the last 72 hours.  Cardiac Enzymes  Recent Labs Lab 07/28/14 1627 07/28/14 2224 07/29/14 0859  TROPONINI 0.34* 0.42* 0.24*   ------------------------------------------------------------------------------------------------------------------ Invalid input(s): POCBNP   Recent Labs  07/28/14 1538  07/28/14 2034 07/29/14 0800 07/29/14 1124  GLUCAP 147* 170* 117* 216*     RAI,RIPUDEEP M.D. [facility] 07/29/2014, 12:11 PM  Pager: [phone]   Between 7am to 7pm - call Pager - [phone]  After 7pm go to [email or web address] - password [email or web address]  Call night coverage person covering after 7pm

## 2014-07-29 NOTE — Progress Notes (Signed)
   07/29/14 1553  PT Time Calculation  PT Start Time (ACUTE ONLY) 1510  PT Stop Time (ACUTE ONLY) 1537  PT Time Calculation (min) (ACUTE ONLY) 27 min  PT G-Codes **NOT FOR INPATIENT CLASS**  Functional Assessment Tool Used (clinical judgement)  Functional Limitation Mobility: Walking and moving around  Mobility: Walking and Moving Around Current Status (Z6109(G8978) CJ  Mobility: Walking and Moving Around Goal Status (U0454(G8979) CI  PT General Charges  $$ ACUTE PT VISIT 1 Procedure  PT Evaluation  $Initial PT Evaluation Tier I 1 Procedure  PT Treatments  $Gait Training 8-22 mins   Rebeca Alertjannie Shantale Holtmeyer, MPT [phone]

## 2014-07-29 NOTE — Evaluation (Signed)
Clinical/Bedside Swallow Evaluation Patient Details  Name: Nicholas Barnett MRN: [id number] Date of Birth: 06/29/1931  Today's Date: 07/29/2014 Time: SLP Start Time (ACUTE ONLY): 1420 SLP Stop Time (ACUTE ONLY): 1453 SLP Time Calculation (min) (ACUTE ONLY): 33 min  Past Medical History:  Past Medical History  Diagnosis Date  . Type 2 diabetes mellitus with autonomic neuropathy   . Hyperlipidemia   . Hypertension   . Arthritis   . Diverticulitis   . Parkinsonism   . Right knee pain   . Memory loss   . Depression   . Gait disturbance   . Restless leg   . Thyroid disease   . Vitamin D deficiency   . Osteoarthritis   . Osteoporosis   . Dysphagia, idiopathic     History of aspiration  . Recurrent falls     Using a cane and walker  . Dizzy   . Hearing loss of both ears   . Neck pain   . Thoracic back pain   . Lumbar back pain   . Shoulder pain   . Cyanocobalamin deficiency   . Other and unspecified hyperlipidemia   . Unspecified essential hypertension   . Abnormality of gait   . Unspecified hypothyroidism   . Restless legs syndrome (RLS)   . Bifascicular block    Past Surgical History:  Past Surgical History  Procedure Laterality Date  . Cataract extraction Bilateral 2012    Dr. Hazle Quant   HPI:  Patient is a 81 year old male, with diabetes, hypertension, parkinsonism, chronic shoulder pain, hypertension, hyperlipidemia, gait abnormality presented to ED with severe chest pain and back pain, shoulder pain and neck pain since 4 AM in the morning. History was obtained from the patient, his wife and daughter in the room.  The patient's wife reported that he woke up at 4 AM in the morning and complained about the pain, the chest pain described as pleuritic, worse with deep breathing, . He denied any shortness of breath or coughing or URI symptoms. The patient's daughter however reported that he had low-grade fever. He was unable to get out of the bed due to pain. Patient also reported  chronic weakness in his legs and has obtained physical therapy for that. The patient reports no prior history of coronary artery disease. His wife also reported aspiration and dysphagia due to parkinsonism.  Chart review revealed previous MBS 02/05/2014 which showed silent aspiration of thin liquids and silent penetration of nectar and honey liquids.  A chin tuck with a double swallow was noted to prevent the penetration.  The patient reports that he received limited outpatient ST and that he had not been thickening his liquids at home.  The risks of aspiration were discussed with the patient and he is agreeable to thickening his liquids given his current medical diagnosis.     Assessment / Plan / Recommendation Clinical Impression  Clinical swallow evaluation was completed.  Of note the patient had a previous MBS 02/05/14 with recommendation for dysphagia 3 and honey thick liquids.  He was to use a chin tuck and double swallow to prevent silent penetration.  The patient reports that he has not been compliant with the thickened liquids nor the compensatory maneuvers.  The patient presents currently with oral/pharyngeal dysphagia characterized by delayed oral transit, delayed swallow trigger and decreased hyo-laryngeal excursion.   Overt s/s of aspiration were not observed with intake.  The patient's daughter does report that he was unable to take anything in yesterday without significant  coughing.  Recommend dysphagia 2 diet with honey thick liquids using a chin tuck and double swallow pending the results of a repeat MBS.      Aspiration Risk  Moderate    Diet Recommendation Dysphagia 2 (Fine chop);Honey   Medication Administration: Crushed with puree Compensations: Multiple dry swallows after each bite/sip;Chin tuck    Other  Recommendations Oral Care Recommendations: Oral care BID Other Recommendations: Order thickener from pharmacy;Prohibited food (jello, ice cream, thin soups);Remove water  pitcher;Have oral suction available       Swallow Study Prior Functional Status   Previous MBS 02/05/2014 with some follow up ST therapy.      General Date of Onset: 07/28/14 Other Pertinent Information: Patient is a 81 year old male, with diabetes, hypertension, parkinsonism, chronic shoulder pain, hypertension, hyperlipidemia, gait abnormality presented to ED with severe chest pain and back pain, shoulder pain and neck pain since 4 AM in the morning. History was obtained from the patient, his wife and daughter in the room.  The patient's wife reported that he woke up at 4 AM in the morning and complained about the pain, the chest pain described as pleuritic, worse with deep breathing, . He denied any shortness of breath or coughing or URI symptoms. The patient's daughter however reported that he had low-grade fever. He was unable to get out of the bed due to pain. Patient also reported chronic weakness in his legs and has obtained physical therapy for that. The patient reports no prior history of coronary artery disease. His wife also reported aspiration and dysphagia due to parkinsonism.  Chart review revealed previous MBS 02/05/2014 which showed silent aspiration of thin liquids and silent penetration of nectar and honey liquids.  A chin tuck with a double swallow was noted to prevent the penetration.  The patient reports that he received limited outpatient ST and that he had not been thickening his liquids at home.   Type of Study: Bedside swallow evaluation Previous Swallow Assessment: 02/05/2014 Diet Prior to this Study: NPO Temperature Spikes Noted: Yes History of Recent Intubation: No Behavior/Cognition: Alert;Cooperative;Pleasant mood Oral Cavity - Dentition: Adequate natural dentition/normal for age Self-Feeding Abilities: Able to feed self Patient Positioning: Upright in chair/Tumbleform Baseline Vocal Quality: Low vocal intensity Volitional Cough: Weak Volitional Swallow: Able to  elicit    Oral/Motor/Sensory Function Overall Oral Motor/Sensory Function: Appears within functional limits for tasks assessed Labial ROM: Within Functional Limits Labial Symmetry: Within Functional Limits Labial Strength: Within Functional Limits Lingual ROM: Within Functional Limits (The pt unable to perform lingual elevation well.  ) Lingual Symmetry: Within Functional Limits (Fasciculations were noted with lingual movement.  ) Lingual Strength: Within Functional Limits Facial ROM: Within Functional Limits Facial Symmetry: Within Functional Limits Facial Strength: Within Functional Limits Mandible: Within Functional Limits   Ice Chips Ice chips: Not tested   Thin Liquid Thin Liquid: Not tested    Nectar Thick Nectar Thick Liquid: Not tested   Honey Thick Honey Thick Liquid: Impaired Presentation: Cup;Spoon;Self fed Oral Phase Impairments: Impaired anterior to posterior transit Oral Phase Functional Implications: Prolonged oral transit Pharyngeal Phase Impairments: Suspected delayed Swallow;Decreased hyoid-laryngeal movement   Puree Puree: Impaired Presentation: Spoon Oral Phase Impairments: Impaired anterior to posterior transit Oral Phase Functional Implications:  (delayed oral transit) Pharyngeal Phase Impairments: Suspected delayed Swallow;Decreased hyoid-laryngeal movement   Solid   GO    Solid: Impaired Presentation: Self Fed Oral Phase Impairments: Impaired anterior to posterior transit Oral Phase Functional Implications:  (delayed oral transit) Pharyngeal Phase Impairments:  Suspected delayed Swallow;Decreased hyoid-laryngeal movement       Fleet Contras 07/29/2014,3:07 PM  Dimas Aguas, MA, CCC-SLP Acute Rehab SLP [phone]

## 2014-07-29 NOTE — Consult Note (Signed)
CARDIOLOGY CONSULT NOTE     Primary Care Physician: Kimber Relic, MD Referring Physician:  Admit Date: 07/28/2014  Reason for consultation:  Nicholas Barnett is a 81 y.o. male with a h/o multiple chronic comorbidities including diabetes, hypertension, parkinsonism, chronic shoulder pain, hypertension, hyperlipidemia, and  gait abnormality who presents with pleuritic chest pain as well as back and shoulder pain. His symptoms began yesterday am at 4.  He describes the pain as worse with deep breathing, 7/10 also more in the back and between the shoulder blades.  His pain was sharp in intensity. He denied any shortness of breath but has had a nonproductive cough.  Patient's daughter however reported that he had low-grade fever.  He has a h/o aspiration per family which is attributed to parkinsonism. The patient reports no prior history of coronary artery disease.   His discomfort has improved. He has had a very mildly elevated troponin.  Cardiology is asked to provide consultation.   Past Medical History  Diagnosis Date  . Type 2 diabetes mellitus with autonomic neuropathy   . Hyperlipidemia   . Hypertension   . Arthritis   . Diverticulitis   . Parkinsonism   . Right knee pain   . Memory loss   . Depression   . Gait disturbance   . Restless leg   . Thyroid disease   . Vitamin D deficiency   . Osteoarthritis   . Osteoporosis   . Dysphagia, idiopathic     History of aspiration  . Recurrent falls     Using a cane and walker  . Dizzy   . Hearing loss of both ears   . Neck pain   . Thoracic back pain   . Lumbar back pain   . Shoulder pain   . Cyanocobalamin deficiency   . Other and unspecified hyperlipidemia   . Unspecified essential hypertension   . Abnormality of gait   . Unspecified hypothyroidism   . Restless legs syndrome (RLS)   . Bifascicular block    Past Surgical History  Procedure Laterality Date  . Cataract extraction Bilateral 2012    Dr. Hazle Quant    .  amLODipine  5 mg Oral Daily  . aspirin  324 mg Oral Once  . aspirin EC  81 mg Oral q morning - 10a  . benazepril  5 mg Oral Daily  . calcium-vitamin D  1 tablet Oral BID  . carbidopa-levodopa  1 tablet Oral QHS  . carbidopa-levodopa  2 tablet Oral 2 times per day  . enoxaparin (LOVENOX) injection  40 mg Subcutaneous Q24H  . gabapentin  300 mg Oral BID  . insulin aspart  0-5 Units Subcutaneous QHS  . insulin aspart  0-9 Units Subcutaneous TID WC  . ketorolac  30 mg Intravenous Once  . methocarbamol (ROBAXIN)  IV  500 mg Intravenous Q6H  . mupirocin ointment  1 application Nasal QHS  . piperacillin-tazobactam (ZOSYN)  IV  3.375 g Intravenous 3 times per day  . pravastatin  20 mg Oral q1800  . tamsulosin  0.4 mg Oral QPC supper      Allergies  Allergen Reactions  . Caffeine Swelling    Joint swelling     History   Social History  . Marital Status: Married    Spouse Name: Kathie Rhodes  . Number of Children: 3  . Years of Education: College   Occupational History  . Retired     Engineer, petroleum [employer]   Social History Main Topics  .  Smoking status: Former Smoker    Quit date: 01/31/1974  . Smokeless tobacco: Never Used  . Alcohol Use: No  . Drug Use: No  . Sexual Activity: No   Other Topics Concern  . Not on file   Social History Narrative   Married 1956. Returned from a Production designer, theatre/television/film for [employer].   Patient lives at home with his family.   Caffeine Use: none   Patient has a living will and healthcare power of attorney.    Family History  Problem Relation Age of Onset  . Heart disease Mother   . Diabetes Mother   . Stroke Mother   . Cancer Father     prostate  . Diabetes Sister   . Heart disease Brother     ROS- All systems are reviewed and negative except as per the HPI above  Physical Exam: Telemetry: Filed Vitals:   07/28/14 1341 07/28/14 2036 07/29/14 0230 07/29/14 0703  BP: 143/72 118/65 124/70 140/73  Pulse: 79 68 67 66  Temp: 98.4 F (36.9 C) 98.4 F (36.9  C) 98.6 F (37 C) 98.1 F (36.7 C)  TempSrc: Oral Oral Oral Oral  Resp: Height:      Weight:      SpO2: 95% 95% 92% 94%    GEN- The patient is elderly and chronically ill appearing, alert and oriented x 3 today.   Head- normocephalic, atraumatic Eyes-  Sclera clear, conjunctiva pink Ears- hearing intact Oropharynx- clear Neck- supple  Lungs- Clear to ausculation bilaterally, normal work of breathing Heart- Regular rate and rhythm  GI- soft, NT, ND, + BS Extremities- no clubbing, cyanosis, or edema MS- age appropriate atrophy Skin- no rash or lesion Psych- euthymic mood, full affect Neuro- strength and sensation are intact  EKG: sinus rhythm with bifascicular block (unchanged)  Labs:   Lab Results  Component Value Date   WBC 14.2* 07/28/2014   HGB 14.7 07/28/2014   HCT 43.9 07/28/2014   MCV 92.0 07/28/2014   PLT 200 07/28/2014    Recent Labs Lab 07/28/14 1105 07/28/14 1125 07/28/14 1517  NA 140 141  --   K 3.7 3.6  --   CL 105 104  --   CO2 24  --   --   BUN 18 19  --   CREATININE 0.70 0.70 0.74  CALCIUM 9.0  --   --   PROT 6.8  --   --   BILITOT 0.8  --   --   ALKPHOS 71  --   --   ALT 13*  --   --   AST 23  --   --   GLUCOSE 144* 151*  --    Lab Results  Component Value Date   TROPONINI 0.42* 07/28/2014    Lab Results  Component Value Date   CHOL 129 04/16/2014   CHOL 132 10/13/2013   CHOL 139 04/10/2013   Lab Results  Component Value Date   HDL 53 04/16/2014   HDL 49 10/13/2013   HDL 50 04/10/2013   Lab Results  Component Value Date   LDLCALC 61 04/16/2014   LDLCALC 56 10/13/2013   LDLCALC 63 04/10/2013   Lab Results  Component Value Date   TRIG 73 04/16/2014   TRIG 136 10/13/2013   TRIG 131 04/10/2013   Lab Results  Component Value Date   CHOLHDL 2.4 04/16/2014   CHOLHDL 2.7 10/13/2013   CHOLHDL 2.8 04/10/2013   Lab Results  Component Value  Date   LDLDIRECT 68.6 05/13/2012      Radiology:reviewed  Echo:   pending  ASSESSMENT AND PLAN:   1. Atypical chest pain/ elevated troponin Unlikely cardiac given pleuritic nature.  I am suspicious of pericardial/ pleural inflammatory process.  Troponin elevation is mild and likely due to a demand effect.  I do not feel that he has had an acute MI.   Given his advanced age, parkinsons, and other issues, I would not advise aggressive workup in this patient.  His family would favor a very conservative approach, which I agree is most appropriate. Continue ASA and statin.  Avoid beta blockers given parkinsons and bifascicular block if able. Primary team to evaluate for other causes of his discomfort. Echo is pending.  If echo is low risk, then no further CV workup is planned.  2. Bifascicular block Asymptomatic Stable when compared to prior ekgs  3. HTN Stable No change required today     Hillis RangeJames Roxas Clymer, MD 07/29/2014  8:48 AM

## 2014-07-29 NOTE — Progress Notes (Signed)
VASCULAR LAB PRELIMINARY  PRELIMINARY  PRELIMINARY  PRELIMINARY  Bilateral lower extremity venous duplex  completed.    Preliminary report:  Bilateral:  No evidence of DVT, superficial thrombosis, or Baker's Cyst.    Darrik Richman, RVT 07/29/2014, 8:52 AM

## 2014-07-30 ENCOUNTER — Observation Stay (HOSPITAL_COMMUNITY): Payer: PPO

## 2014-07-30 DIAGNOSIS — R9431 Abnormal electrocardiogram [ECG] [EKG]: Secondary | ICD-10-CM

## 2014-07-30 DIAGNOSIS — R1310 Dysphagia, unspecified: Secondary | ICD-10-CM | POA: Diagnosis not present

## 2014-07-30 DIAGNOSIS — I10 Essential (primary) hypertension: Secondary | ICD-10-CM | POA: Diagnosis not present

## 2014-07-30 DIAGNOSIS — R079 Chest pain, unspecified: Secondary | ICD-10-CM | POA: Diagnosis not present

## 2014-07-30 DIAGNOSIS — M25519 Pain in unspecified shoulder: Secondary | ICD-10-CM

## 2014-07-30 DIAGNOSIS — G238 Other specified degenerative diseases of basal ganglia: Secondary | ICD-10-CM | POA: Diagnosis not present

## 2014-07-30 LAB — BASIC METABOLIC PANEL
Anion gap: 9 (ref 5–15)
BUN: 23 mg/dL — ABNORMAL HIGH (ref 6–20)
CHLORIDE: 104 mmol/L (ref 101–111)
CO2: 26 mmol/L (ref 22–32)
Calcium: 9.1 mg/dL (ref 8.9–10.3)
Creatinine, Ser: 0.92 mg/dL (ref 0.61–1.24)
GFR calc Af Amer: >60 mL/min (ref >60)
GFR calc non Af Amer: >60 mL/min (ref >60)
Glucose, Bld: 122 mg/dL — ABNORMAL HIGH (ref 65–99)
Potassium: 3.2 mmol/L — ABNORMAL LOW (ref 3.5–5.1)
Sodium: 139 mmol/L (ref 135–145)

## 2014-07-30 LAB — CBC
HCT: 37.7 % — ABNORMAL LOW (ref 39.0–52.0)
HEMOGLOBIN: 12.6 g/dL — AB (ref 13.0–17.0)
MCH: 30.8 pg (ref 26.0–34.0)
MCHC: 33.4 g/dL (ref 30.0–36.0)
MCV: 92.2 fL (ref 78.0–100.0)
Platelets: 181 10*3/uL (ref 150–400)
RBC: 4.09 MIL/uL — ABNORMAL LOW (ref 4.22–5.81)
RDW: 13.3 % (ref 11.5–15.5)
WBC: 11.1 10*3/uL — ABNORMAL HIGH (ref 4.0–10.5)

## 2014-07-30 LAB — GLUCOSE, CAPILLARY
GLUCOSE-CAPILLARY: 103 mg/dL — AB (ref 65–99)
GLUCOSE-CAPILLARY: 220 mg/dL — AB (ref 65–99)

## 2014-07-30 LAB — HEMOGLOBIN A1C
HEMOGLOBIN A1C: 5.8 % — AB (ref 4.8–5.6)
MEAN PLASMA GLUCOSE: 120 mg/dL

## 2014-07-30 MED ORDER — POTASSIUM CHLORIDE CRYS ER 20 MEQ PO TBCR
40.0000 meq | EXTENDED_RELEASE_TABLET | Freq: Once | ORAL | Status: AC
Start: 2014-07-30 — End: 2014-07-30
  Administered 2014-07-30: 40 meq via ORAL
  Filled 2014-07-30: qty 2

## 2014-07-30 MED ORDER — NAPROXEN SODIUM 220 MG PO TABS: 440.0000 mg | ORAL_TABLET | Freq: Two times a day (BID) | ORAL | Status: DC

## 2014-07-30 MED ORDER — AMOXICILLIN-POT CLAVULANATE 875-125 MG PO TABS: 1.0000 | ORAL_TABLET | Freq: Two times a day (BID) | ORAL | Status: DC

## 2014-07-30 MED ORDER — AMOXICILLIN-POT CLAVULANATE 875-125 MG PO TABS
1.0000 | ORAL_TABLET | Freq: Two times a day (BID) | ORAL | Status: DC
  Administered 2014-07-30: 1 via ORAL
  Filled 2014-07-30: qty 1

## 2014-07-30 MED ORDER — METHOCARBAMOL 500 MG PO TABS: 500.0000 mg | ORAL_TABLET | Freq: Three times a day (TID) | ORAL | Status: DC

## 2014-07-30 MED ORDER — OXYCODONE-ACETAMINOPHEN 5-325 MG PO TABS: 1.0000 | ORAL_TABLET | ORAL | Status: DC | PRN

## 2014-07-30 MED ORDER — RESOURCE THICKENUP CLEAR PO POWD: ORAL | Status: DC

## 2014-07-30 MED ORDER — PANTOPRAZOLE SODIUM 40 MG PO TBEC: 40.0000 mg | DELAYED_RELEASE_TABLET | Freq: Two times a day (BID) | ORAL | Status: DC

## 2014-07-30 MED ORDER — UNABLE TO FIND: Status: DC

## 2014-07-30 NOTE — Progress Notes (Signed)
CHL IP CLINICAL IMPRESSIONS 07/30/2014  Therapy Diagnosis Mild oral phase dysphagia;Moderate pharyngeal phase dysphagia  Clinical Impression Mild oral and moderate pharyngeal dysphagia with sensorimotor deficits due to pt's Parkinsonism. Pt with decreased motor strength/contraction resulting in decreased coordination/delay of oral transiting, decreased tongue base retraction and laryngeal elevation. Moderate=-severe residuals noted in pharynx without pt sensation. Chin tuck and head turn did not help to decrease residuals. Following solids with liquids helpful to decrease pharyngeal residuals. Pt with NO aspiration and mild penetration of thin/nectar cleared with cued throat clearing. Wife reports more problems with solids than liquids. Xerostomia also contributing factor.    Recommend advancing diet to dys3/thin with strict precautions. Using live video and diagram, educated pt and spouse to findings/recommendations. Provided all information in writing as well, as pt reports he is scheduled to dc today. Will follow up while inhouse. Encouraged pt to strengthen cough/voice and expectoration abilities as much as able for maximal airway protection.       CHL IP TREATMENT RECOMMENDATION 07/30/2014  Treatment Recommendations Therapy as outlined in treatment plan below     CHL IP DIET RECOMMENDATION 07/30/2014  SLP Diet Recommendations Dysphagia 3 (Mech soft);Thin  Liquid Administration via (None)  Medication Administration Crushed with puree  Compensations Multiple dry swallows after each bite/sip;Follow solids with liquid;Clear throat intermittently  Postural Changes and/or Swallow Maneuvers (None)       Donavan Burnetamara Bethenny Losee, MS [facility] [phone]

## 2014-07-30 NOTE — Progress Notes (Signed)
PT NOTE  Attempted PT tx session. Pt eating lunch. Will check back as schedule permits. Thanks. Rebeca AlertJannie Salbador Fiveash, MPT [phone]

## 2014-07-30 NOTE — Discharge Summary (Signed)
Physician Discharge Summary   Patient ID: Nicholas Barnett MRN: [id number] DOB/AGE: 1931/10/11 81 y.o.  Admit date: 07/28/2014 Discharge date: 07/30/2014  Primary Care Physician:  Kimber Relic, MD  Discharge Diagnoses:    . atypical Chest pain likely referred from his shoulders    Aspiration pneumonia  . Type 2 diabetes mellitus with autonomic neuropathy . Thoracic back pain . Shoulder pain, acute on chronic  . Parkinson's plus syndrome . Essential hypertension . slightly positive troponin with abnormal EKG  Consults:  Cardiology Dr. Johney Frame   Recommendations for Outpatient Follow-up:  Patient will need physical therapy and steroid-induced injections in his shoulders due to his chronic history of degenerative osteoarthritis and bursitis  DIET:  Initially speech therapist recommended Dysphagia 2 diet with nectar thick liquids however subsequently, patient underwent MBS on 6/6 and was recommended dysphagia 3 diet, mechanical soft with thin liquids    Allergies:   Allergies  Allergen Reactions  . Caffeine Swelling    Joint swelling      Discharge Medications:   Medication List    TAKE these medications        amLODipine 5 MG tablet  Commonly known as:  NORVASC  Take one tablet by mouth once daily to control blood pressure     amoxicillin-clavulanate 875-125 MG per tablet  Commonly known as:  AUGMENTIN  Take 1 tablet by mouth 2 (two) times daily. X 10days     aspirin 81 MG tablet  Take 81 mg by mouth every morning.     benazepril 5 MG tablet  Commonly known as:  LOTENSIN  Take one tablet by mouth once daily for blood pressure     CALCIUM-CARB 600 + D 600-125 MG-UNIT Tabs  Generic drug:  Calcium Carbonate-Vitamin D  Take 1 tablet by mouth 2 (two) times daily.     carbidopa-levodopa 25-100 MG per tablet  Commonly known as:  SINEMET IR  Two tablets morning, two tablets in the afternoon, one tablet at night     cyanocobalamin 1000 MCG/ML injection  Commonly  known as:  (VITAMIN B-12)  1 mL into muscle every 30 days. DX: b12 deficiency     freestyle lancets  Use to check blood sugar once daily Dx E11.43     FREESTYLE LITE Devi  Use to check blood sugar once daily Dx E11.43     gabapentin 100 MG capsule  Commonly known as:  NEURONTIN  Take 3 at supper to prevent restless legs     glipiZIDE 5 MG 24 hr tablet  Commonly known as:  GLUCOTROL XL  Take one tablet by mouth once daily to control blood sugar     glucose blood test strip  Commonly known as:  FREESTYLE LITE  Use to check blood sugar once daily. Dx E11.43     metFORMIN 1000 MG tablet  Commonly known as:  GLUCOPHAGE  Take one tablet by mouth twice daily to control blood sugar     methocarbamol 500 MG tablet  Commonly known as:  ROBAXIN  Take 1 tablet (500 mg total) by mouth 3 (three) times daily.     mupirocin ointment 2 %  Commonly known as:  BACTROBAN  Place 1 application into the nose at bedtime. Applies to lesion on scrotum     naproxen sodium 220 MG tablet  Commonly known as:  ANAPROX  Take 2 tablets (440 mg total) by mouth 2 (two) times daily with a meal. X 5days, then as needed  oxyCODONE-acetaminophen 5-325 MG per tablet  Commonly known as:  PERCOCET/ROXICET  Take 1 tablet by mouth every 4 (four) hours as needed for severe pain.     pantoprazole 40 MG tablet  Commonly known as:  PROTONIX  Take 1 tablet (40 mg total) by mouth 2 (two) times daily.     pravastatin 20 MG tablet  Commonly known as:  PRAVACHOL  Take one tablet by mouth once daily to control cholesterol     RESOURCE THICKENUP CLEAR Powd  Take as directed, NECTAR THICK     SYRINGE 3CC/25GX1" 25G X 1" 3 ML Misc  Use as directed with Cyanocobalmin     tamsulosin 0.4 MG Caps capsule  Commonly known as:  FLOMAX  Take one tablet by mouth once daily at bedtime to prevent urinary frequency     UNABLE TO FIND  - Outpatient physical therapy   -   - Diagnosis: generalized debility, parkinsonism          Brief H and P: For complete details please refer to admission H and P, but in briefPatient is a 81 year old male, with diabetes, hypertension, parkinsonism, chronic shoulder pain, hypertension, hyperlipidemia, gait abnormality presented to ED with severe chest pain and back pain, shoulder pain and neck pain since 4 AM in the morning. History was obtained from the patient, his wife and daughter in the room. Patient appears to be somewhat lethargic however has just received morphine prior to my encounter. Patient's wife reported that he woke up at 4 AM in the morning and complained about the pain, the chest pain described as pleuritic, worse with deep breathing, 7/10 also more in the back and between the shoulder blades, sharp and constant. He denied any shortness of breath or coughing or URI symptoms. Patient's daughter however reported that he had low-grade fever. He was unable to get out of the bed due to pain. Patient also reported chronic weakness in his legs and has obtained physical therapy for that.  The patient reports no prior history of coronary artery disease. Wife also reported aspiration and dysphagia due to parkinsonism. EDP recommendations noted, troponin point of care negative, CBC showed a white count of 13.5, BMET unremarkable EKG however showed right bundle branch block, LAFB, rate 82, normal sinus rhythm, repolarization changes no prior EKG to compare with. EDP discussed with Dr. Branch/cardiology, recommended admission for observation  Hospital Course:  Chest pain, atypical with some pleuritic features and musculoskeletal: Sudden onset, constant, also has has back pain and shoulder pain (chronic).  Patient was admitted for further workup. Troponin was slightly positive and hence cardiology was consulted, patient was seen by Dr. Johney FrameAllred who did not feel patient had acute ACS. 2-D echocardiogram per cardiology is normal and cleared to be discharged home. CT angiogram of the  chest negative for acute PE, Doppler ultrasound negative for DVT Pain could be referred pain from his shoulders, both shoulder x-ray shows severe degenerative osteoarthritis, thoracic spine x-ray negative for any intravertebral fracture. Patient was started on Toradol for 24 hours with Robaxin which significantly helped his chest pain. He can continue PPI. Patient was given the prescription for naproxen, Percocet as needed (30 tablets) with Robaxin. He will need PT at home however his wife requested for outpatient physical therapy.  Aspiration pneumonitis with prior dysphagia - Per daughter, patient has been evaluated by speech therapy in the past and has aspiration, he does not follow diet recommendations, he was recommended in the past. Patient was seen by speech therapist  inpatient and recommended dysphagia 2 diet with nectar thick liquids, however he underwent MBS on 6/6 and he was recommended to upgrade his diet to dysphagia 3 with thin liquids. Patient was initially placed on Zosyn, which has been discontinued and transitioned to oral Augmentin to complete the course.    Essential hypertension - Continue benazepril, Norvasc   Parkinson's plus syndrome -Continue Sinemet   Recurrent falls - PTOT evaluation recommended home PT OT   Shoulder pain Bilateral - Both shoulders x-rays showed with severe degenerative osteoarthritis, patient had steroid-induced injection in the shoulders in the past. For now placed on pain control, recommended steroidal injection again.   Type 2 diabetes mellitus with autonomic neuropathy - Patient was placed on sliding scale insulin while inpatient.    Day of Discharge BP 125/66 mmHg  Pulse 62  Temp(Src) 99.3 F (37.4 C) (Oral)  Resp 20  Ht 6' (1.829 m)  Wt 83.008 kg (183 lb)  BMI 24.81 kg/m2  SpO2 94%  Physical Exam: General: Alert and awake oriented x3 not in any acute distress. HEENT: anicteric sclera, pupils reactive to light and  accommodation CVS: S1-S2 clear no murmur rubs or gallops Chest: clear to auscultation bilaterally, no wheezing rales or rhonchi Abdomen: soft nontender, nondistended, normal bowel sounds Extremities: no cyanosis, clubbing or edema noted bilaterally Neuro: Cranial nerves II-XII intact, no focal neurological deficits   The results of significant diagnostics from this hospitalization (including imaging, microbiology, ancillary and laboratory) are listed below for reference.    LAB RESULTS: Basic Metabolic Panel:  Recent Labs Lab 07/28/14 1105 07/28/14 1125 07/28/14 1517 07/30/14 0415  NA 140 141  --  139  K 3.7 3.6  --  3.2*  CL 105 104  --  104  CO2 24  --   --  26  GLUCOSE 144* 151*  --  122*  BUN 18 19  --  23*  CREATININE 0.70 0.70 0.74 0.92  CALCIUM 9.0  --   --  9.1   Liver Function Tests:  Recent Labs Lab 07/28/14 1105  AST 23  ALT 13*  ALKPHOS 71  BILITOT 0.8  PROT 6.8  ALBUMIN 3.8   No results for input(s): LIPASE, AMYLASE in the last 168 hours. No results for input(s): AMMONIA in the last 168 hours. CBC:  Recent Labs Lab 07/28/14 1517 07/30/14 0415  WBC 14.2* 11.1*  HGB 14.7 12.6*  HCT 43.9 37.7*  MCV 92.0 92.2  PLT 200 181   Cardiac Enzymes:  Recent Labs Lab 07/28/14 2224 07/29/14 0859  TROPONINI 0.42* 0.24*   BNP: Invalid input(s): POCBNP CBG:  Recent Labs Lab 07/30/14 0728 07/30/14 1138  GLUCAP 103* 220*    Significant Diagnostic Studies:  Dg Thoracic Spine W/swimmers  07/28/2014   CLINICAL DATA:  Chronic bilateral shoulder pain and upper back pain for 6 weeks  EXAM: THORACIC SPINE - 2 VIEW + SWIMMERS  COMPARISON:  None.  FINDINGS: Normal alignment of the thoracic vertebral bodies. There is mild endplate spurring in the lower thoracic spine. No acute loss vertebral body height and disc height. Normal paraspinal lines. Chronic bronchitic markings in the lungs.  IMPRESSION: 1. No acute findings of the thoracic spine. 2. Chronic  bronchitic markings in the lungs.   Electronically Signed   By: Genevive Bi M.D.   On: 07/28/2014 16:56   Dg Shoulder Right  07/28/2014   CLINICAL DATA:  Bilateral shoulder pain.  No injury  EXAM: RIGHT SHOULDER - 2+ VIEW  COMPARISON:  Radiograph  11/04/2012  FINDINGS: There deformity of the right humeral head with a large inferior osteophyte. There is loss of joint space the glenohumeral joint with associated sclerosis. There several calcific bodies in the shoulder bursa. No acute findings.  IMPRESSION: Severe degenerative change of the right shoulder joint.   Electronically Signed   By: Genevive Bi M.D.   On: 07/28/2014 16:51   Ct Angio Chest Pe W/cm &/or Wo Cm  07/28/2014   CLINICAL DATA:  Acute onset left-sided chest pain radiating to back and shoulder.  EXAM: CT ANGIOGRAPHY CHEST WITH CONTRAST  TECHNIQUE: Multidetector CT imaging of the chest was performed using the standard protocol during bolus administration of intravenous contrast. Multiplanar CT image reconstructions and MIPs were obtained to evaluate the vascular anatomy.  CONTRAST:  OMNIPAQUE IOHEXOL 350 MG/ML SOLN  COMPARISON:  None.  FINDINGS: Mediastinum/Lymph Nodes: Satisfactory opacification of pulmonary arteries is noted, and no pulmonary embolism identified. Some respiratory motion artifact noted. Mild cardiomegaly and coronary artery calcification noted. No evidence of thoracic aortic dissection. Mild ascending thoracic aortic aneurysm is seen measuring 4.3 cm in maximum diameter.  No masses or pathologically enlarged lymph nodes identified.  Lungs/Pleura: Mild dependent atelectasis seen bilaterally. No evidence of pulmonary consolidation or mass. No effusion present. A subpleural pulmonary nodule is seen in the lingula on image 57 which measures 5 mm.  Musculoskeletal/Soft Tissues: No suspicious bone lesions or other significant chest wall abnormality.  Upper Abdomen: Tiny calcified gallstones incidentally noted, without signs  of acute cholecystitis although gallbladder is incompletely visualized on this exam.  Review of the MIP images confirms the above findings.  IMPRESSION: No evidence of acute pulmonary embolism, thoracic aortic dissection, or other acute findings.  4.3 cm ascending thoracic aortic aneurysm. Recommend annual imaging followup by CTA or MRA. This recommendation follows 2010 ACCF/AHA/AATS/ACR/ASA/SCA/SCAI/SIR/STS/SVM Guidelines for the Diagnosis and Management of Patients with Thoracic Aortic Disease. Circulation. 2010; 121: B147-W295.  5 mm indeterminate subpleural pulmonary nodule in the lingula. If the patient is at high risk for bronchogenic carcinoma, follow-up chest CT at 6-12 months is recommended. If the patient is at low risk for bronchogenic carcinoma, follow-up chest CT at 12 months is recommended. This recommendation follows the consensus statement: Guidelines for Management of Small Pulmonary Nodules Detected on CT Scans: A Statement from the Fleischner Society as published in Radiology 2005;237:395-400.  Cholelithiasis. No definite radiographic evidence of cholecystitis although gallbladder is incompletely visualized on this exam. Recommend clinical correlation, and consider nuclear medicine hepatobiliary scan for further evaluation if clinically warranted.   Electronically Signed   By: Myles Rosenthal M.D.   On: 07/28/2014 14:37   Dg Chest Portable 1 View  07/28/2014   CLINICAL DATA:  Pt woke up at 4am today with hard cough that caused chest pain. Fever and HTN also. When lying still, chest pain subsides, but has pain between shoulder blades that is persistent. Hx of parkinsons.  EXAM: PORTABLE CHEST - 1 VIEW  COMPARISON:  11/04/2012  FINDINGS: Cardiac silhouette borderline enlarged. Aorta is uncoiled. No mediastinal or hilar masses. Lung volumes are relatively low. Allowing for this, there is no evidence of pulmonary edema or lung consolidation. No pleural effusion or pneumothorax.  Bony thorax grossly  intact.  IMPRESSION: No acute cardiopulmonary disease.   Electronically Signed   By: Amie Portland M.D.   On: 07/28/2014 11:35   Dg Shoulder Left  07/28/2014   CLINICAL DATA:  Chronic bilateral shoulder and upper back pain for 6 weeks  EXAM: LEFT  SHOULDER - 2+ VIEW  COMPARISON:  Radiograph 11/04/2012  FINDINGS: No fracture dislocation of the left shoulder. There is osteophytosis of the humeral head glenohumeral joint. There is a large loose body inferior to the humeral head.  IMPRESSION: No acute findings. Moderate to severe osteoarthritis of the left shoulder. Similar findings to prior.   Electronically Signed   By: Genevive Bi M.D.   On: 07/28/2014 16:53    2D ECHO:   Disposition and Follow-up: Discharge Instructions    Diet Carb Modified    Complete by:  As directed      Increase activity slowly    Complete by:  As directed             DISPOSITION: Home   DISCHARGE FOLLOW-UP Follow-up Information    Follow up with Kimber Relic, MD On 08/09/2014.   Specialty:  Internal Medicine   Why:  Appointment time:  Thursday, August 09, 2014 at 2:45 pm with Shanda Bumps, Nurse Practitioner   Contact information:   [address] [place] [address] [phone]        Time spent on Discharge: 35 minutes  Signed:   RAI,RIPUDEEP M.D. [facility] 07/30/2014, 3:14 PM Pager: [phone]

## 2014-07-30 NOTE — Progress Notes (Signed)
Echocardiogram 2D Echocardiogram has been performed.  Dorothey BasemanReel, Muadh Creasy M 07/30/2014, 12:33 PM

## 2014-07-30 NOTE — Progress Notes (Signed)
Physical Therapy Treatment Patient Details Name: Nicholas HalterDavid L Jumper MRN: [id number] DOB: 10-13-1931 Today's Date: 07/30/2014    History of Present Illness 81 yo male admitted with chest pain, weakness. Hx of Parkinson's, asp precautions, dm, htn, bil shoulder arthritis, chronic LE edema, neuropathy, osteoporosis.     PT Comments    Progressing with mobility. Discussed d/c plan with pt/wife-both feel comfortable [facility]   Follow Up Recommendations  Home health PT;Supervision/Assistance - 24 hour     Equipment Recommendations  None recommended by PT    Recommendations for Other Services       Precautions / Restrictions Precautions Precautions: Fall Restrictions Weight Bearing Restrictions: No    Mobility  Bed Mobility Overal bed mobility: Needs Assistance Bed Mobility: Supine to Sit     Supine to sit: Min assist     General bed mobility comments: assist to get to EOB. Increased time.   Transfers Overall transfer level: Needs assistance Equipment used: Rolling walker (2 wheeled) Transfers: Sit to/from Stand Sit to Stand: Min assist         General transfer comment: Assist to rise, stabilize, control descent. VCs safety, hand placement  Ambulation/Gait Ambulation/Gait assistance: Min assist Ambulation Distance (Feet): 185 Feet Assistive device: Rolling walker (2 wheeled) Gait Pattern/deviations: Decreased stride length;Step-through pattern     General Gait Details: Parkinson's gait pattern evident intermittently during ambulation. VCs for posture, step length. Increased difficulty with turning.    Stairs            Wheelchair Mobility    Modified Rankin (Stroke Patients Only)       Balance           Standing balance support: During functional activity;Bilateral upper extremity supported Standing balance-Leahy Scale: Poor                      Cognition Arousal/Alertness: Awake/alert Behavior During Therapy: WFL for  tasks assessed/performed Overall Cognitive Status: Within Functional Limits for tasks assessed                      Exercises      General Comments        Pertinent Vitals/Pain Pain Assessment: No/denies pain    Home Living                      Prior Function            PT Goals (current goals can now be found in the care plan section) Progress towards PT goals: Progressing toward goals    Frequency  Min 3X/week    PT Plan Current plan remains appropriate    Co-evaluation             End of Session Equipment Utilized During Treatment: Gait belt Activity Tolerance: Patient tolerated treatment well Patient left: in chair;with call bell/phone within reach;with family/visitor present     Time: [id number] PT Time Calculation (min) (ACUTE ONLY): 15 min  Charges:  $Gait Training: 8-22 mins                    G Codes:  Functional Limitation: Mobility: Walking and moving around Mobility: Walking and Moving Around Goal Status [phone](G8979): At least 1 percent but less than 20 percent impaired, limited or restricted Mobility: Walking and Moving Around Discharge Status [phone](G8980): At least 1 percent but less than 20 percent impaired, limited or restricted   Rebeca AlertJannie Myking Sar, MPT Pager: [phone]

## 2014-07-30 NOTE — Progress Notes (Signed)
PT profile: Nicholas Barnett is a 81 y.o. male with a h/o multiple chronic comorbidities including diabetes, hypertension, parkinsonism, chronic shoulder pain, hypertension, hyperlipidemia, and gait abnormality who presents with pleuritic chest pain as well as back and shoulder pain. His symptoms began yesterday am at 4. He describes the pain as worse with deep breathing, 7/10 also more in the back and between the shoulder blades. His pain was sharp in intensity. He denied any shortness of breath but has had a nonproductive cough. Patient's daughter however reported that he had low-grade fever. He has a h/o aspiration per family which is attributed to parkinsonism. The patient reports no prior history of coronary artery disease.  Subjective: No complaints  Objective: Vital signs in last 24 hours: Temp:  [97.7 F (36.5 C)-99.3 F (37.4 C)] 99.3 F (37.4 C) (06/06 0445) Pulse Rate:  [62-68] 62 (06/06 0445) Resp:  [20-22] 20 (06/06 0445) BP: (105-129)/(54-73) 129/56 mmHg (06/06 0445) SpO2:  [94 %-96 %] 94 % (06/06 0445) Weight change:    Intake/Output from previous day: -225 06/05 0701 - 06/06 0700 In: 675 [P.O.:360; IV Piggyback:315] Out: 900 [Urine:900] Intake/Output this shift:    PE:  General:Pleasant affect, NAD Skin:Warm and dry, brisk capillary refill HEENT:normocephalic, sclera clear, mucus membranes moist Heart:S1S2 RRR without murmur, gallup, rub or click Lungs:clear without rales, rhonchi, or wheezes ZHY:QMVHAbd:soft, non tender, + BS, do not palpate liver spleen or masses Ext:no lower ext edema, 2+ pedal pulses, 2+ radial pulses Neuro:alert and oriented X 3, MAE, follows commands, + facial symmetry Tele:  SR occ PACs    Lab Results:  Recent Labs  07/28/14 1517 07/30/14 0415  WBC 14.2* 11.1*  HGB 14.7 12.6*  HCT 43.9 37.7*  PLT 200 181   BMET  Recent Labs  07/28/14 1105 07/28/14 1125 07/28/14 1517 07/30/14 0415  NA 140 141  --  139  K 3.7 3.6   --  3.2*  CL 105 104  --  104  CO2 24  --   --  26  GLUCOSE 144* 151*  --  122*  BUN 18 19  --  23*  CREATININE 0.70 0.70 0.74 0.92  CALCIUM 9.0  --   --  9.1    Recent Labs  07/28/14 2224 07/29/14 0859  TROPONINI 0.42* 0.24*    Lab Results  Component Value Date   CHOL 129 04/16/2014   HDL 53 04/16/2014   LDLCALC 61 04/16/2014   LDLDIRECT 68.6 05/13/2012   TRIG 73 04/16/2014   CHOLHDL 2.4 04/16/2014   Lab Results  Component Value Date   HGBA1C 5.9* 10/13/2013     Lab Results  Component Value Date   TSH 0.98 05/13/2012    Hepatic Function Panel  Recent Labs  07/28/14 1105  PROT 6.8  ALBUMIN 3.8  AST 23  ALT 13*  ALKPHOS 71  BILITOT 0.8   No results for input(s): CHOL in the last 72 hours. No results for input(s): PROTIME in the last 72 hours.     Studies/Results: Dg Thoracic Spine W/swimmers  07/28/2014   CLINICAL DATA:  Chronic bilateral shoulder pain and upper back pain for 6 weeks  EXAM: THORACIC SPINE - 2 VIEW + SWIMMERS  COMPARISON:  None.  FINDINGS: Normal alignment of the thoracic vertebral bodies. There is mild endplate spurring in the lower thoracic spine. No acute loss vertebral body height and disc height. Normal paraspinal lines. Chronic bronchitic markings in the lungs.  IMPRESSION: 1. No  acute findings of the thoracic spine. 2. Chronic bronchitic markings in the lungs.   Electronically Signed   By: Genevive Bi M.D.   On: 07/28/2014 16:56   Dg Shoulder Right  07/28/2014   CLINICAL DATA:  Bilateral shoulder pain.  No injury  EXAM: RIGHT SHOULDER - 2+ VIEW  COMPARISON:  Radiograph 11/04/2012  FINDINGS: There deformity of the right humeral head with a large inferior osteophyte. There is loss of joint space the glenohumeral joint with associated sclerosis. There several calcific bodies in the shoulder bursa. No acute findings.  IMPRESSION: Severe degenerative change of the right shoulder joint.   Electronically Signed   By: Genevive Bi M.D.    On: 07/28/2014 16:51   Ct Angio Chest Pe W/cm &/or Wo Cm  07/28/2014   CLINICAL DATA:  Acute onset left-sided chest pain radiating to back and shoulder.  EXAM: CT ANGIOGRAPHY CHEST WITH CONTRAST  TECHNIQUE: Multidetector CT imaging of the chest was performed using the standard protocol during bolus administration of intravenous contrast. Multiplanar CT image reconstructions and MIPs were obtained to evaluate the vascular anatomy.  CONTRAST:  OMNIPAQUE IOHEXOL 350 MG/ML SOLN  COMPARISON:  None.  FINDINGS: Mediastinum/Lymph Nodes: Satisfactory opacification of pulmonary arteries is noted, and no pulmonary embolism identified. Some respiratory motion artifact noted. Mild cardiomegaly and coronary artery calcification noted. No evidence of thoracic aortic dissection. Mild ascending thoracic aortic aneurysm is seen measuring 4.3 cm in maximum diameter.  No masses or pathologically enlarged lymph nodes identified.  Lungs/Pleura: Mild dependent atelectasis seen bilaterally. No evidence of pulmonary consolidation or mass. No effusion present. A subpleural pulmonary nodule is seen in the lingula on image 57 which measures 5 mm.  Musculoskeletal/Soft Tissues: No suspicious bone lesions or other significant chest wall abnormality.  Upper Abdomen: Tiny calcified gallstones incidentally noted, without signs of acute cholecystitis although gallbladder is incompletely visualized on this exam.  Review of the MIP images confirms the above findings.  IMPRESSION: No evidence of acute pulmonary embolism, thoracic aortic dissection, or other acute findings.  4.3 cm ascending thoracic aortic aneurysm. Recommend annual imaging followup by CTA or MRA. This recommendation follows 2010 ACCF/AHA/AATS/ACR/ASA/SCA/SCAI/SIR/STS/SVM Guidelines for the Diagnosis and Management of Patients with Thoracic Aortic Disease. Circulation. 2010; 121: W098-J191.  5 mm indeterminate subpleural pulmonary nodule in the lingula. If the patient is at  high risk for bronchogenic carcinoma, follow-up chest CT at 6-12 months is recommended. If the patient is at low risk for bronchogenic carcinoma, follow-up chest CT at 12 months is recommended. This recommendation follows the consensus statement: Guidelines for Management of Small Pulmonary Nodules Detected on CT Scans: A Statement from the Fleischner Society as published in Radiology 2005;237:395-400.  Cholelithiasis. No definite radiographic evidence of cholecystitis although gallbladder is incompletely visualized on this exam. Recommend clinical correlation, and consider nuclear medicine hepatobiliary scan for further evaluation if clinically warranted.   Electronically Signed   By: Myles Rosenthal M.D.   On: 07/28/2014 14:37   Dg Chest Portable 1 View  07/28/2014   CLINICAL DATA:  Pt woke up at 4am today with hard cough that caused chest pain. Fever and HTN also. When lying still, chest pain subsides, but has pain between shoulder blades that is persistent. Hx of parkinsons.  EXAM: PORTABLE CHEST - 1 VIEW  COMPARISON:  11/04/2012  FINDINGS: Cardiac silhouette borderline enlarged. Aorta is uncoiled. No mediastinal or hilar masses. Lung volumes are relatively low. Allowing for this, there is no evidence of pulmonary edema or lung  consolidation. No pleural effusion or pneumothorax.  Bony thorax grossly intact.  IMPRESSION: No acute cardiopulmonary disease.   Electronically Signed   By: Amie Portland M.D.   On: 07/28/2014 11:35   Dg Shoulder Left  07/28/2014   CLINICAL DATA:  Chronic bilateral shoulder and upper back pain for 6 weeks  EXAM: LEFT SHOULDER - 2+ VIEW  COMPARISON:  Radiograph 11/04/2012  FINDINGS: No fracture dislocation of the left shoulder. There is osteophytosis of the humeral head glenohumeral joint. There is a large loose body inferior to the humeral head.  IMPRESSION: No acute findings. Moderate to severe osteoarthritis of the left shoulder. Similar findings to prior.   Electronically Signed   By:  Genevive Bi M.D.   On: 07/28/2014 16:53    Medications: I have reviewed the patient's current medications. Scheduled Meds: . amLODipine  5 mg Oral Daily  . amoxicillin-clavulanate  1 tablet Oral Q12H  . aspirin  324 mg Oral Once  . aspirin EC  81 mg Oral q morning - 10a  . benazepril  5 mg Oral Daily  . calcium-vitamin D  1 tablet Oral BID  . carbidopa-levodopa  1 tablet Oral QHS  . carbidopa-levodopa  2 tablet Oral 2 times per day  . enoxaparin (LOVENOX) injection  40 mg Subcutaneous Q24H  . gabapentin  300 mg Oral BID  . insulin aspart  0-5 Units Subcutaneous QHS  . insulin aspart  0-9 Units Subcutaneous TID WC  . ketorolac  15 mg Intravenous 4 times per day  . methocarbamol (ROBAXIN)  IV  500 mg Intravenous Q6H  . mupirocin ointment  1 application Nasal QHS  . pantoprazole  40 mg Oral BID  . potassium chloride  40 mEq Oral Once  . pravastatin  20 mg Oral q1800  . tamsulosin  0.4 mg Oral QPC supper   Continuous Infusions:  PRN Meds:.acetaminophen, morphine injection, nitroGLYCERIN, ondansetron (ZOFRAN) IV, oxyCODONE-acetaminophen, RESOURCE THICKENUP CLEAR  Assessment/Plan: Principal Problem:   Chest pain Active Problems:   Essential hypertension   Parkinson's plus syndrome   Dysphagia, idiopathic   Recurrent falls   Thoracic back pain   Shoulder pain   Type 2 diabetes mellitus with autonomic neuropathy   Abnormal EKG   Pain in the chest  1. Atypical chest pain/ elevated troponin Unlikely cardiac given pleuritic nature. I am suspicious of pericardial/ pleural inflammatory process. Troponin elevation is mild and likely due to a demand effect. I do not feel that he has had an acute MI. Troponin 0.31-->0.34-->0.42-->0.24 Given his advanced age, parkinsons, and other issues, Dr. Johney Frame felt we would not advise aggressive workup in this patient. His family would favor a very conservative approach, which he  Agreed was most appropriate. Continue ASA and statin.  Avoid beta blockers given parkinsons and bifascicular block if able. Primary team to evaluate for other causes of his discomfort. Echo is done but not read.. If echo is low risk, then no further CV workup is planned.  2. Bifascicular block Asymptomatic Stable when compared to prior ekgs  3. HTN Stable No change required today  plan is for discharge once PT has seen and echo resulted.     Time spent with pt. :15 minutes. [facility]  Nurse Practitioner Certified Pager [phone] or after 5pm and on weekends call [phone] 07/30/2014, 9:57 AM   I have examined the patient and reviewed assessment and plan and discussed with patient.  Agree with above as stated.  Atypical chest pain.  Resolved. Awaiting echo  result.  I suspect he will be ok for discharge.  He is anxious to go home.   Onetha Gaffey S.

## 2014-08-01 NOTE — Progress Notes (Signed)
   07/29/14 1453  SLP G-Codes **NOT FOR INPATIENT CLASS**  Functional Assessment Tool Used skilled clinical judgement via chart review  Functional Limitations Swallowing  Swallow Current Status (Z6109(G8996) CL  Swallow Goal Status (U0454(G8997) CK  Monterius Rolf MA, CCC-SLP [phone]

## 2014-08-09 ENCOUNTER — Encounter: Payer: Self-pay | Admitting: Nurse Practitioner

## 2014-08-09 ENCOUNTER — Ambulatory Visit (INDEPENDENT_AMBULATORY_CARE_PROVIDER_SITE_OTHER): Payer: PPO | Admitting: Nurse Practitioner

## 2014-08-09 VITALS — BP 132/78 | HR 69 | Temp 97.4°F | Resp 20 | Ht 72.0 in | Wt 188.8 lb

## 2014-08-09 DIAGNOSIS — M25561 Pain in right knee: Secondary | ICD-10-CM

## 2014-08-09 DIAGNOSIS — J690 Pneumonitis due to inhalation of food and vomit: Secondary | ICD-10-CM | POA: Diagnosis not present

## 2014-08-09 DIAGNOSIS — M25519 Pain in unspecified shoulder: Secondary | ICD-10-CM

## 2014-08-09 DIAGNOSIS — R079 Chest pain, unspecified: Secondary | ICD-10-CM

## 2014-08-09 DIAGNOSIS — R1310 Dysphagia, unspecified: Secondary | ICD-10-CM | POA: Diagnosis not present

## 2014-08-09 NOTE — Progress Notes (Signed)
Patient ID: Nicholas Barnett, male   DOB: 1931/06/24, 81 y.o.   MRN: [id number]    PCP: Kimber Relic, MD  Allergies  Allergen Reactions  . Caffeine Swelling    Joint swelling     Chief Complaint  Patient presents with  . Hospitalization Follow-up     HPI: Patient is a 81 y.o. male seen in the office today for hospital follow up. Pt with a pmh of diabetes, hypertension, parkinsonism, chronic shoulder pain, hypertension, hyperlipidemia, gait abnormality. Pt was admitted to the hospital due to chest pain discharged on 07/30/14. Cardiology consulted and it was not felt to be cardiac.  2-D echocardiogram per cardiology was normal, CT angiogram of the chest negative for acute PE, Doppler ultrasound negative for DVT Pain could be referred pain from his shoulders, both shoulder x-ray shows severe degenerative osteoarthritis, pt  was given the prescription for naproxen, Percocet as needed with Robaxin. Pain medication not helping. Robaxin does more harm than good per pt.  Makes him weaker.  Wife plans to make appt with ortho, wants to see Dr Darrelyn Hillock. (discussed injections in the hospital) Has not gone to therapy yet due to weakness, plans to go to outpatient therapy.   Pt also noted on have aspiration pneumonia, has completed augmentin. Patient underwent MBS on 6/6 and was recommended dysphagia 3 diet, mechanical soft with thin liquids.  No shortness of breath, no fever, no congestion.   Eating well, not drinking much, wife has encouraged  Review of Systems:  Review of Systems  Constitutional: Positive for appetite change (Loss of appetite) and fatigue. Unexpected weight change: has had weight gain.       Losing weight  HENT: Positive for hearing loss (Wearing hearing aids) and voice change (Voice is weaker.). Negative for ear pain.        Edentulous  Eyes: Negative.   Respiratory: Positive for cough (Related to recurrent aspiration). Negative for shortness of breath.   Cardiovascular:  Negative for chest pain, palpitations and leg swelling.  Gastrointestinal: Negative for diarrhea (was having diarrhea but this has resolved) and constipation.  Endocrine:       Diabetic.  Musculoskeletal: Positive for myalgias, back pain, arthralgias, gait problem, neck pain and neck stiffness.  Skin: Negative for pallor, rash and wound.  Allergic/Immunologic: Negative.   Neurological: Positive for weakness (Generalized).       Memory loss. Confusion.  Hematological:       Petechia  Psychiatric/Behavioral: Positive for dysphoric mood.    Past Medical History  Diagnosis Date  . Type 2 diabetes mellitus with autonomic neuropathy   . Hyperlipidemia   . Hypertension   . Arthritis   . Diverticulitis   . Parkinsonism   . Right knee pain   . Memory loss   . Depression   . Gait disturbance   . Restless leg   . Thyroid disease   . Vitamin D deficiency   . Osteoarthritis   . Osteoporosis   . Dysphagia, idiopathic     History of aspiration  . Recurrent falls     Using a cane and walker  . Dizzy   . Hearing loss of both ears   . Neck pain   . Thoracic back pain   . Lumbar back pain   . Shoulder pain   . Cyanocobalamin deficiency   . Other and unspecified hyperlipidemia   . Unspecified essential hypertension   . Abnormality of gait   . Unspecified hypothyroidism   . Restless legs  syndrome (RLS)   . Bifascicular block    Past Surgical History  Procedure Laterality Date  . Cataract extraction Bilateral 2012    Dr. Hazle Quant   Social History:   reports that he quit smoking about 40 years ago. He has never used smokeless tobacco. He reports that he does not drink alcohol or use illicit drugs.  Family History  Problem Relation Age of Onset  . Heart disease Mother   . Diabetes Mother   . Stroke Mother   . Cancer Father     prostate  . Diabetes Sister   . Heart disease Brother     Medications: Patient's Medications  New Prescriptions   No medications on file  Previous  Medications   AMLODIPINE (NORVASC) 5 MG TABLET    Take one tablet by mouth once daily to control blood pressure   AMOXICILLIN-CLAVULANATE (AUGMENTIN) 875-125 MG PER TABLET    Take 1 tablet by mouth 2 (two) times daily. X 10days   ASPIRIN 81 MG TABLET    Take 81 mg by mouth every morning.    BENAZEPRIL (LOTENSIN) 5 MG TABLET    Take one tablet by mouth once daily for blood pressure   BLOOD GLUCOSE MONITORING SUPPL (FREESTYLE LITE) DEVI    Use to check blood sugar once daily Dx E11.43   CALCIUM CARBONATE-VITAMIN D (CALCIUM-CARB 600 + D) 600-125 MG-UNIT TABS    Take 1 tablet by mouth 2 (two) times daily.    CARBIDOPA-LEVODOPA (SINEMET IR) 25-100 MG PER TABLET    Two tablets morning, two tablets in the afternoon, one tablet at night   CYANOCOBALAMIN (,VITAMIN B-12,) 1000 MCG/ML INJECTION    1 mL into muscle every 30 days. DX: b12 deficiency   GABAPENTIN (NEURONTIN) 100 MG CAPSULE    Take 3 at supper to prevent restless legs   GLIPIZIDE (GLUCOTROL XL) 5 MG 24 HR TABLET    Take one tablet by mouth once daily to control blood sugar   GLUCOSE BLOOD (FREESTYLE LITE) TEST STRIP    Use to check blood sugar once daily. Dx E11.43   LANCETS (FREESTYLE) LANCETS    Use to check blood sugar once daily Dx E11.43   METFORMIN (GLUCOPHAGE) 1000 MG TABLET    Take one tablet by mouth twice daily to control blood sugar   METHOCARBAMOL (ROBAXIN) 500 MG TABLET    Take 1 tablet (500 mg total) by mouth 3 (three) times daily.   MUPIROCIN OINTMENT (BACTROBAN) 2 %    Place 1 application into the nose at bedtime. Applies to lesion on scrotum   NAPROXEN SODIUM (ANAPROX) 220 MG TABLET    Take 2 tablets (440 mg total) by mouth 2 (two) times daily with a meal. X 5days, then as needed   OXYCODONE-ACETAMINOPHEN (PERCOCET/ROXICET) 5-325 MG PER TABLET    Take 1 tablet by mouth every 4 (four) hours as needed for severe pain.   PANTOPRAZOLE (PROTONIX) 40 MG TABLET    Take 1 tablet (40 mg total) by mouth 2 (two) times daily.   PRAVASTATIN  (PRAVACHOL) 20 MG TABLET    Take one tablet by mouth once daily to control cholesterol   SYRINGE/NEEDLE, DISP, (SYRINGE 3CC/25GX1") 25G X 1" 3 ML MISC    Use as directed with Cyanocobalmin   TAMSULOSIN (FLOMAX) 0.4 MG CAPS CAPSULE    Take one tablet by mouth once daily at bedtime to prevent urinary frequency   UNABLE TO FIND    Outpatient physical therapy   Diagnosis: generalized debility,  parkinsonism  Modified Medications   No medications on file  Discontinued Medications   MALTODEXTRIN-XANTHAN GUM (RESOURCE THICKENUP CLEAR) POWD    Take as directed, NECTAR THICK     Physical Exam:  Filed Vitals:   08/09/14 1440  BP: 132/78  Pulse: 69  Temp: 97.4 F (36.3 C)  TempSrc: Oral  Resp: 20  Height: 6' (1.829 m)  Weight: 188 lb 12.8 oz (85.639 kg)  SpO2: 96%    Physical Exam  Constitutional: He is oriented to person, place, and time. No distress.  Thin frail elderly male   HENT:  Head: Normocephalic and atraumatic.  Mouth/Throat: Oropharynx is clear and moist. No oropharyngeal exudate.  Eyes: Conjunctivae and EOM are normal. Pupils are equal, round, and reactive to light.  Neck: Normal range of motion. Neck supple.  Cardiovascular: Normal rate, regular rhythm and normal heart sounds.   Pulmonary/Chest: Effort normal and breath sounds normal.  Abdominal: Soft. Bowel sounds are normal.  Musculoskeletal: He exhibits tenderness (with decreased ROM to bilateral shoulders). He exhibits no edema.  Neurological: He is alert and oriented to person, place, and time.  Skin: Skin is warm and dry. He is not diaphoretic.  Psychiatric: He has a normal mood and affect.    Labs reviewed: Basic Metabolic Panel:  Recent Labs  16/10/96 0913 07/28/14 1105 07/28/14 1125 07/28/14 1517 07/30/14 0415  NA 141 140 141  --  139  K 4.2 3.7 3.6  --  3.2*  CL 100 105 104  --  104  CO2 25 24  --   --  26  GLUCOSE 131* 144* 151*  --  122*  BUN --  23*  CREATININE 1.01 0.70 0.70 0.74  0.92  CALCIUM 10.0 9.0  --   --  9.1   Liver Function Tests:  Recent Labs  04/16/14 0913 07/28/14 1105  AST 13 23  ALT 11 13*  ALKPHOS 92 71  BILITOT 0.4 0.8  PROT 6.5 6.8  ALBUMIN  --  3.8   No results for input(s): LIPASE, AMYLASE in the last 8760 hours. No results for input(s): AMMONIA in the last 8760 hours. CBC:  Recent Labs  04/16/14 0913 07/28/14 1105 07/28/14 1125 07/28/14 1517 07/30/14 0415  WBC 9.3 13.5*  --  14.2* 11.1*  NEUTROABS 6.2  --   --   --   --   HGB 14.9 14.7 16.0 14.7 12.6*  HCT 43.7 43.6 47.0 43.9 37.7*  MCV 93 94.0  --  92.0 92.2  PLT  --  211  --  200 181   Lipid Panel:  Recent Labs  10/13/13 0928 04/16/14 0913  CHOL 132 129  HDL 49 53  LDLCALC 56 61  TRIG 136 73  CHOLHDL 2.7 2.4   TSH: No results for input(s): TSH in the last 8760 hours. A1C: Lab Results  Component Value Date   HGBA1C 5.8* 07/28/2014     Assessment/Plan 1. Dysphagia Patient underwent MBS on 6/6 and was recommended dysphagia 3 diet, mechanical soft with thin liquids.  2. Right knee pain -ongoing, discussed need for rehab, cont with cane and fall precautions - Ambulatory referral to Orthopedics  3. Shoulder pain, unspecified laterality -did not want to take robaxin or naproxen, not working -conts oxycodone as needed - Ambulatory referral to Orthopedics for further evaluation and joint injection  4. Chest pain, unspecified chest pain type musculoskeletal pain, heat pad works  -robaxin side effects not worth taking, will remove from medication  list  5. Aspiration pneumonia, unspecified aspiration pneumonia type -completed Augmentin, cont with occasional cough -aspiration precautions in place - CBC with Differential - Basic metabolic panel  6. Weakness  Outpatient therapy ordered for gait and strength training, has not gone due to weakness, highly recommended making a follow up appt   To keep follow up with Dr Chilton Si as scheduled  Janene Harvey.  Biagio Borg  [facility] [phone] 8 am - 5 pm) [phone] (after hours)

## 2014-08-10 LAB — CBC WITH DIFFERENTIAL/PLATELET
Basophils Absolute: 0 10*3/uL (ref 0.0–0.2)
Basos: 1 %
EOS (ABSOLUTE): 0.2 10*3/uL (ref 0.0–0.4)
EOS: 3 %
Hematocrit: 41.7 % (ref 37.5–51.0)
Hemoglobin: 13.9 g/dL (ref 12.6–17.7)
IMMATURE GRANS (ABS): 0 10*3/uL (ref 0.0–0.1)
Immature Granulocytes: 0 %
LYMPHS: 26 %
Lymphocytes Absolute: 1.9 10*3/uL (ref 0.7–3.1)
MCH: 31.6 pg (ref 26.6–33.0)
MCHC: 33.3 g/dL (ref 31.5–35.7)
MCV: 95 fL (ref 79–97)
MONOCYTES: 6 %
Monocytes Absolute: 0.4 10*3/uL (ref 0.1–0.9)
NEUTROS PCT: 64 %
Neutrophils Absolute: 4.6 10*3/uL (ref 1.4–7.0)
PLATELETS: 348 10*3/uL (ref 150–379)
RBC: 4.4 x10E6/uL (ref 4.14–5.80)
RDW: 13.4 % (ref 12.3–15.4)
WBC: 7.2 10*3/uL (ref 3.4–10.8)

## 2014-08-10 LAB — BASIC METABOLIC PANEL
BUN/Creatinine Ratio: 18 (ref 10–22)
BUN: 16 mg/dL (ref 8–27)
CHLORIDE: 101 mmol/L (ref 97–108)
CO2: 24 mmol/L (ref 18–29)
CREATININE: 0.91 mg/dL (ref 0.76–1.27)
Calcium: 9.8 mg/dL (ref 8.6–10.2)
GFR calc non Af Amer: 79 mL/min/{1.73_m2} (ref >59)
GFR, EST AFRICAN AMERICAN: 91 mL/min/{1.73_m2} (ref >59)
Glucose: 111 mg/dL — ABNORMAL HIGH (ref 65–99)
Potassium: 4.8 mmol/L (ref 3.5–5.2)
SODIUM: 140 mmol/L (ref 134–144)

## 2014-08-16 ENCOUNTER — Encounter: Payer: Self-pay | Admitting: *Deleted

## 2014-09-18 ENCOUNTER — Ambulatory Visit (INDEPENDENT_AMBULATORY_CARE_PROVIDER_SITE_OTHER): Payer: PPO | Admitting: Neurology

## 2014-09-18 ENCOUNTER — Encounter: Payer: Self-pay | Admitting: Neurology

## 2014-09-18 VITALS — BP 130/80 | HR 76 | Ht 72.0 in | Wt 183.0 lb

## 2014-09-18 DIAGNOSIS — G239 Degenerative disease of basal ganglia, unspecified: Secondary | ICD-10-CM | POA: Diagnosis not present

## 2014-09-18 DIAGNOSIS — R1314 Dysphagia, pharyngoesophageal phase: Secondary | ICD-10-CM

## 2014-09-18 DIAGNOSIS — G903 Multi-system degeneration of the autonomic nervous system: Secondary | ICD-10-CM

## 2014-09-18 MED ORDER — CARBIDOPA-LEVODOPA 25-100 MG PO TABS: ORAL_TABLET | ORAL | Status: DC

## 2014-09-18 NOTE — Progress Notes (Signed)
Nicholas Barnett was seen today in the movement disorders clinic for neurologic consultation at the request of GREEN, Lenon CurtARTHUR G, MD.  The consultation is for the evaluation of Parkinsonism.  The patient has previously seen Dr. Sandria ManlyLove and Dr. Marjory LiesPenumalli.  Records were reviewed from both of those physicians.  The patient's symptoms started in 2010.  First symptoms consisted of dizziness and falls.  He began to see Dr. Sandria ManlyLove around the time that symptoms began.  He first had an MRI of the brain in February 2011 that demonstrated rather significant atrophy.  B12 at that time was found to be low at 181.  He had a swallow study in June, 2011 that demonstrated moderate dysphagia with intermittent aspiration of thin liquids.  He was tried on levodopa (I do not know what dose and they don't even remember the medication or how long he was on it) and that did not seem to help.  He was later tried on ropinirole which also did not help.  He was tried on, and is still on, gabapentin for restless leg syndrome.  His most recent MRI of the brain was on 11/21/2013 and I reviewed this as well as his previous MRI of the brain.  There remains very significant atrophy, although there is not significant basal ganglia disease.  There is mild to moderate small vessel disease.  02/13/14 update:  Pt was seen today for UPDRS motor on/off testing.  He is accompanied by his family who supplements the hx.  Pt had MBE on 02/05/14 and demonstrated mild oral and mod pharyngeal phase dysphagia and mechanical soft with honey thick liquid was recommended.  He is still awaiting ST to contact him (out pt).  05/15/14 update:  The patient is accompanied by his family supplements the history.  Patient has a history of multiple system atrophy.  He has been minimally responsive to levodopa via UPDRS on/off last visit but they wanted to retry the medication at last visit and he was told to take 2 tablets at 11 AM (wakeup time) 2 at 3 PM and 1 at 7 PM.  Pt  states that last visit he walked in here with a walker and left without it after the on/off test.  Has felt much better ever since.  He was doing well in PT but pt states that the last time he went he got put on a machine that hurt his shoulder.  His wife does state that the levodopa has helped him remarkably.  He is now able to do much of his own dressing and bathing and he was not able to do that previously.  No falls since our last visit.  Pt states that he could not deal with the thickened food and understands the risks of not following the recommended diet.  He is, however, eating better and has a much better appetite.  He has gained some weight.  He did not get speech therapy; wife states that they were to call back to set this up but didn't.    09/18/14 update:  The patient presents today, accompanied by his wife who supplements the history.  The patients records were reviewed since last visit.  Despite the fact that the patient's UPDRS motor on/off test did not show much benefit with levodopa, the patient and his wife think that the levodopa has been very beneficial at home and helped freezing.  Therefore, he has been on carbidopa/levodopa 25/100, 2 tablets in the morning, 2 in the afternoon  and one towards evening.  He doesn't think that's its working as well but his wife thinks that hes not taking it as prescribed.  He was admitted to the hospital from June 4 to June 6 with atypical chest pain.  While he was in the hospital he had a modified barium swallow study done on 07/30/2014.  This recommended a continued dysphasia 3 diet, this time with thin liquids with strict precautions.  There was evidence of moderate pharyngeal phase dysphagia.  Pt admits that he isn't following the diet.  Pt states that about 3 weeks ago he fell backward and hit his head on a patio table.  Wife states that it didn't hit hard and no LOC.  No other falls.  He did have an injection in both shoulders recently and told that he  would be candidate for shoulder replacement.  His shoulders are still hurting him.  Neuroimaging has previously been performed.  It is available for my review.  I reviewed pts MRI brain from 11/21/13.  There was no evidence of BG disease but there was very significant atrophy.    PREVIOUS MEDICATIONS: Sinemet and Requip  ALLERGIES:   Allergies  Allergen Reactions  . Caffeine Swelling    Joint swelling     CURRENT MEDICATIONS:  Outpatient Encounter Prescriptions as of 09/18/2014  Medication Sig  . amLODipine (NORVASC) 5 MG tablet Take one tablet by mouth once daily to control blood pressure  . aspirin 81 MG tablet Take 81 mg by mouth every morning.   . benazepril (LOTENSIN) 5 MG tablet Take one tablet by mouth once daily for blood pressure  . Calcium Carbonate-Vitamin D (CALCIUM-CARB 600 + D) 600-125 MG-UNIT TABS Take 1 tablet by mouth 2 (two) times daily.   . carbidopa-levodopa (SINEMET IR) 25-100 MG per tablet Three tablets morning, three tablets in the afternoon, one tablet at night  . cyanocobalamin (,VITAMIN B-12,) 1000 MCG/ML injection 1 mL into muscle every 30 days. DX: b12 deficiency  . gabapentin (NEURONTIN) 100 MG capsule Take 3 at supper to prevent restless legs (Patient taking differently: Take 300 mg by mouth at bedtime. To prevent restless legs)  . glipiZIDE (GLUCOTROL XL) 5 MG 24 hr tablet Take one tablet by mouth once daily to control blood sugar  . meloxicam (MOBIC) 15 MG tablet Take 15 mg by mouth daily.  . metFORMIN (GLUCOPHAGE) 1000 MG tablet Take one tablet by mouth twice daily to control blood sugar  . mupirocin ointment (BACTROBAN) 2 % Apply 1 application topically daily. Applies to lesion on scrotum  . pantoprazole (PROTONIX) 40 MG tablet Take 1 tablet (40 mg total) by mouth 2 (two) times daily.  . pravastatin (PRAVACHOL) 20 MG tablet Take one tablet by mouth once daily to control cholesterol  . tamsulosin (FLOMAX) 0.4 MG CAPS capsule Take one tablet by mouth once  daily at bedtime to prevent urinary frequency  . [DISCONTINUED] carbidopa-levodopa (SINEMET IR) 25-100 MG per tablet Two tablets morning, two tablets in the afternoon, one tablet at night (Patient taking differently: Take 1-2 tablets by mouth 3 (three) times daily. Takes two tablets in the morning, two tablets in the afternoon, and one tablet at night)  . Blood Glucose Monitoring Suppl (FREESTYLE LITE) DEVI Use to check blood sugar once daily Dx E11.43 (Patient not taking: Reported on 09/18/2014)  . glucose blood (FREESTYLE LITE) test strip Use to check blood sugar once daily. Dx E11.43 (Patient not taking: Reported on 09/18/2014)  . Lancets (FREESTYLE) lancets Use to  check blood sugar once daily Dx E11.43 (Patient not taking: Reported on 09/18/2014)  . Syringe/Needle, Disp, (SYRINGE 3CC/25GX1") 25G X 1" 3 ML MISC Use as directed with Cyanocobalmin (Patient not taking: Reported on 09/18/2014)  . [DISCONTINUED] oxyCODONE-acetaminophen (PERCOCET/ROXICET) 5-325 MG per tablet Take 1 tablet by mouth every 4 (four) hours as needed for severe pain.  . [DISCONTINUED] UNABLE TO FIND Outpatient physical therapy   Diagnosis: generalized debility, parkinsonism   No facility-administered encounter medications on file as of 09/18/2014.    PAST MEDICAL HISTORY:   Past Medical History  Diagnosis Date  . Type 2 diabetes mellitus with autonomic neuropathy   . Hyperlipidemia   . Hypertension   . Arthritis   . Diverticulitis   . Parkinsonism   . Right knee pain   . Memory loss   . Depression   . Gait disturbance   . Restless leg   . Thyroid disease   . Vitamin D deficiency   . Osteoarthritis   . Osteoporosis   . Dysphagia, idiopathic     History of aspiration  . Recurrent falls     Using a cane and walker  . Dizzy   . Hearing loss of both ears   . Neck pain   . Thoracic back pain   . Lumbar back pain   . Shoulder pain   . Cyanocobalamin deficiency   . Other and unspecified hyperlipidemia   .  Unspecified essential hypertension   . Abnormality of gait   . Unspecified hypothyroidism   . Restless legs syndrome (RLS)   . Bifascicular block     PAST SURGICAL HISTORY:   Past Surgical History  Procedure Laterality Date  . Cataract extraction Bilateral 2012    Dr. Hazle Quant    SOCIAL HISTORY:   History   Social History  . Marital Status: Married    Spouse Name: Kathie Rhodes  . Number of Children: 3  . Years of Education: College   Occupational History  . Retired     Engineer, [employer]   Social History Main Topics  . Smoking status: Former Smoker    Quit date: 01/31/1974  . Smokeless tobacco: Never Used  . Alcohol Use: No  . Drug Use: No  . Sexual Activity: No   Other Topics Concern  . Not on file   Social History Narrative   Married 1956. Returned from a Production designer, theatre/television/film for [employer].   Patient lives at home with his family.   Caffeine Use: none   Patient has a living will and healthcare power of attorney.    FAMILY HISTORY:   Family Status  Relation Status Death Age  . Mother Deceased 29    stroke, MI  . Father Deceased 1    prostate cancer  . Sister Deceased 20    diabetic, cirrhosis  . Brother Deceased 10 months    crib death  . Brother Deceased 55    MI    ROS:  A complete 10 system review of systems was obtained and was unremarkable apart from what is mentioned above.  PHYSICAL EXAMINATION:    VITALS:   Filed Vitals:   09/18/14 1323  BP: 130/80  Pulse: 76  Height: 6' (1.829 m)  Weight: 183 lb (83.008 kg)   Wt Readings from Last 3 Encounters:  09/18/14 183 lb (83.008 kg)  08/09/14 188 lb 12.8 oz (85.639 kg)  07/28/14 183 lb (83.008 kg)     GEN:  The patient appears stated age and is in NAD.  Some pathologic laughter. HEENT:  Normocephalic, atraumatic.  The mucous membranes are moist. The superficial temporal arteries are without ropiness or tenderness. CV:  RRR.   Lungs:  CTAB Neck/HEME:  There are no carotid bruits bilaterally.  There is neck  flexion.    Neurological examination:  Orientation: The patient is alert and oriented x3. Fund of knowledge is appropriate.  Recent and remote memory are intact.  Attention and concentration are normal.    Able to name objects and repeat phrases. Cranial nerves: There is good facial symmetry. There is facial hypomimia.   There are square wave jerks.  The visual fields are full to confrontational testing. The speech is fluent and clear. Soft palate rises symmetrically and there is no tongue deviation. Hearing is intact to conversational tone. Sensation: Sensation is intact to light touch throughout.   Movement examination: Tone: There is normal tone in the RUE.  There is normal tone in the LUE and bilateral lower extremities.   Abnormal movements: none Coordination:  There is slowness with alternation of supination/pronation of the forearm bilaterally, but not necessarily decremation of other forms of rapid alternating movements. Gait and Station: The patient has  difficulty arising out of a deep-seated chair without the use of the hands and after 2 attempts is able to push out of the chair and get up.  He has a very stooped posture.  He walks slowly and when he gets back into the chair he literally falls into the chair.  He shuffles only slightly.    ASSESSMENT/PLAN:  1.  Multiple system atrophy  -Minimal response to levodopa on our UPDRS motor on/off test, but patient and his wife initially reported marketed response to levodopa, even though I did not see that in the office.  He now reports that it is no longer helping.  However, he was using the walker when I first saw him and now still is just using the cane.  Regardless, we decided to try and increase his levodopa from 500 mg per day to 700 mg per day over the next week and I will call him next week and we will see how he did.  If he does not feel any better, then we decided we will stop the medication and see if he feels any different off  the medication.  He and his wife agreed with that approach. 2.  Dysphagia.  -he had a modified barium swallow study done on 07/30/2014.  This recommended a continued dysphasia 3 diet, this time with thin liquids with strict precautions.  He is not doing that and understands the risks of morbidity and mortality. 3.  Sialorrhea.  -Talked about the value of Myobloc.  He will let me know if and when he would like to proceed. 4.  We decided to hold off on a regularly scheduled follow-up based on the above.  I would like to see him do some physical therapy, but he denied that, stating that his knee just hurts too bad to attend physical therapy, even home therapy.  Much greater than 50% of this visit was spent in counseling with the patient and the family.  Total face to face time:  30 min

## 2014-10-01 ENCOUNTER — Telehealth: Payer: Self-pay | Admitting: Neurology

## 2014-10-01 NOTE — Telephone Encounter (Signed)
Spoke with patient's wife. He tried different dosages of Levodopa and decided the 3 tablets in the morning, 3 tablets in the afternoon and 1 at night are helping him the most physically and mentally. He will stay on this dosage and call with any questions.

## 2014-10-09 ENCOUNTER — Ambulatory Visit (INDEPENDENT_AMBULATORY_CARE_PROVIDER_SITE_OTHER): Payer: PPO | Admitting: Internal Medicine

## 2014-10-09 VITALS — HR 79 | Temp 97.9°F

## 2014-10-09 DIAGNOSIS — R300 Dysuria: Secondary | ICD-10-CM | POA: Diagnosis not present

## 2014-10-09 LAB — POCT URINALYSIS DIPSTICK
Glucose, UA: NEGATIVE
Nitrite, UA: NEGATIVE
PROTEIN UA: 30
Spec Grav, UA: <=1.005
UROBILINOGEN UA: 0.2
pH, UA: 5

## 2014-10-11 ENCOUNTER — Telehealth: Payer: Self-pay

## 2014-10-11 LAB — URINE CULTURE

## 2014-10-11 MED ORDER — SULFAMETHOXAZOLE-TRIMETHOPRIM 800-160 MG PO TABS: ORAL_TABLET | ORAL | Status: DC

## 2014-10-11 NOTE — Telephone Encounter (Signed)
Spoke with wife, Dr. Chilton Si said to call in Septra. Wife was pleased with that.

## 2014-10-11 NOTE — Telephone Encounter (Signed)
Start Septra DS (#20 tablets) Sig: One twice daily for infection

## 2014-10-11 NOTE — Telephone Encounter (Signed)
-----   Message from Kimber Relic, MD sent at 10/11/2014 11:43 AM EDT ----- Call his wife with the culture report. Also, have a prescription for Septra DS, # 20, one twice daily, called in.

## 2014-10-11 NOTE — Telephone Encounter (Signed)
Patient's wife was already informed.

## 2014-10-11 NOTE — Telephone Encounter (Signed)
Wife called wanted urine culture report, her husband is in so much pain, when he urinates and blood in the urine. Told her we only had preliminary report, explain about waiting on the sensitives results. Spoke with [place] in the lab, takes 3 days for final report. Called Mrs. Touchet back, she doesn't think her husband can wait, she has to help him out of bed. Will route message to Dr. Chilton Si.

## 2014-10-15 ENCOUNTER — Other Ambulatory Visit: Payer: PPO

## 2014-10-15 DIAGNOSIS — E785 Hyperlipidemia, unspecified: Secondary | ICD-10-CM

## 2014-10-15 DIAGNOSIS — I10 Essential (primary) hypertension: Secondary | ICD-10-CM

## 2014-10-15 DIAGNOSIS — E1143 Type 2 diabetes mellitus with diabetic autonomic (poly)neuropathy: Secondary | ICD-10-CM

## 2014-10-15 NOTE — Progress Notes (Signed)
Patient ID: Nicholas Barnett, male   DOB: [birth year], 81 y.o.   MRN: [id number] Patient came and gave a urine specimen for dipstick. He left following the obtainment of vital signs by medical assistant. He did not want to wait for physician exam.

## 2014-10-16 LAB — BASIC METABOLIC PANEL
BUN / CREAT RATIO: 19 (ref 10–22)
BUN: 22 mg/dL (ref 8–27)
CO2: 24 mmol/L (ref 18–29)
CREATININE: 1.16 mg/dL (ref 0.76–1.27)
Calcium: 9.2 mg/dL (ref 8.6–10.2)
Chloride: 102 mmol/L (ref 97–108)
GFR calc Af Amer: 68 mL/min/{1.73_m2} (ref >59)
GFR, EST NON AFRICAN AMERICAN: 59 mL/min/{1.73_m2} — AB (ref >59)
Glucose: 76 mg/dL (ref 65–99)
Potassium: 4.7 mmol/L (ref 3.5–5.2)
SODIUM: 140 mmol/L (ref 134–144)

## 2014-10-16 LAB — LIPID PANEL
CHOL/HDL RATIO: 4.5 ratio (ref 0.0–5.0)
CHOLESTEROL TOTAL: 140 mg/dL (ref 100–199)
HDL: 31 mg/dL — ABNORMAL LOW (ref >39)
LDL Calculated: 83 mg/dL (ref 0–99)
TRIGLYCERIDES: 131 mg/dL (ref 0–149)
VLDL CHOLESTEROL CAL: 26 mg/dL (ref 5–40)

## 2014-10-16 LAB — HEMOGLOBIN A1C
ESTIMATED AVERAGE GLUCOSE: 128 mg/dL
Hgb A1c MFr Bld: 6.1 % — ABNORMAL HIGH (ref 4.8–5.6)

## 2014-10-16 LAB — MICROALBUMIN, URINE: MICROALBUM., U, RANDOM: 7.7 ug/mL

## 2014-10-17 ENCOUNTER — Ambulatory Visit (INDEPENDENT_AMBULATORY_CARE_PROVIDER_SITE_OTHER): Payer: PPO | Admitting: Internal Medicine

## 2014-10-17 ENCOUNTER — Encounter: Payer: Self-pay | Admitting: Internal Medicine

## 2014-10-17 VITALS — BP 110/70 | HR 71 | Temp 97.3°F | Resp 20 | Ht 72.0 in | Wt 186.8 lb

## 2014-10-17 DIAGNOSIS — N390 Urinary tract infection, site not specified: Secondary | ICD-10-CM | POA: Insufficient documentation

## 2014-10-17 DIAGNOSIS — I10 Essential (primary) hypertension: Secondary | ICD-10-CM | POA: Diagnosis not present

## 2014-10-17 DIAGNOSIS — G2581 Restless legs syndrome: Secondary | ICD-10-CM

## 2014-10-17 DIAGNOSIS — E039 Hypothyroidism, unspecified: Secondary | ICD-10-CM | POA: Diagnosis not present

## 2014-10-17 DIAGNOSIS — R413 Other amnesia: Secondary | ICD-10-CM

## 2014-10-17 DIAGNOSIS — T83511S Infection and inflammatory reaction due to indwelling urethral catheter, sequela: Secondary | ICD-10-CM

## 2014-10-17 DIAGNOSIS — G232 Striatonigral degeneration: Secondary | ICD-10-CM

## 2014-10-17 DIAGNOSIS — T8351XS Infection and inflammatory reaction due to indwelling urinary catheter, sequela: Secondary | ICD-10-CM | POA: Diagnosis not present

## 2014-10-17 DIAGNOSIS — E785 Hyperlipidemia, unspecified: Secondary | ICD-10-CM | POA: Diagnosis not present

## 2014-10-17 DIAGNOSIS — M25512 Pain in left shoulder: Secondary | ICD-10-CM | POA: Diagnosis not present

## 2014-10-17 DIAGNOSIS — E1143 Type 2 diabetes mellitus with diabetic autonomic (poly)neuropathy: Secondary | ICD-10-CM | POA: Diagnosis not present

## 2014-10-17 DIAGNOSIS — Z23 Encounter for immunization: Secondary | ICD-10-CM | POA: Diagnosis not present

## 2014-10-17 DIAGNOSIS — G238 Other specified degenerative diseases of basal ganglia: Secondary | ICD-10-CM

## 2014-10-17 NOTE — Progress Notes (Signed)
Patient ID: Nicholas Barnett, male   DOB: 01-17-1932, 81 y.o.   MRN: [id number]    Facility  [facility]    Place of Service:   OFFICE    Allergies  Allergen Reactions  . Caffeine Swelling    Joint swelling     Chief Complaint  Patient presents with  . Medical Management of Chronic Issues    HPI:  Essential hypertension - controlled  Hyperlipemia - controlled  Hypothyroidism, unspecified hypothyroidism type - compensated  Parkinson's plus syndrome - unchanged. Continues to follow with neurologist  Restless legs syndrome (RLS) - improved  Type 2 diabetes mellitus with autonomic neuropathy - controlled  Memory loss - unchanged  Chronic shoulder pain - recently saw Dr. Dellis Filbert injected both shoulders. Seem to help the right shoulder, but the left one seems worse after the shot.  Treated for urinary tract infection 10/09/2014. Feeling much better. Was quite weak at the time of the infection.    Medications: Patient's Medications  New Prescriptions   No medications on file  Previous Medications   AMLODIPINE (NORVASC) 5 MG TABLET    Take one tablet by mouth once daily to control blood pressure   ASPIRIN 81 MG TABLET    Take 81 mg by mouth every morning.    BENAZEPRIL (LOTENSIN) 5 MG TABLET    Take one tablet by mouth once daily for blood pressure   BLOOD GLUCOSE MONITORING SUPPL (FREESTYLE LITE) DEVI    Use to check blood sugar once daily Dx E11.43   CALCIUM CARBONATE-VITAMIN D (CALCIUM-CARB 600 + D) 600-125 MG-UNIT TABS    Take 1 tablet by mouth 2 (two) times daily.    CARBIDOPA-LEVODOPA (SINEMET IR) 25-100 MG PER TABLET    Three tablets morning, three tablets in the afternoon, one tablet at night   CYANOCOBALAMIN (,VITAMIN B-12,) 1000 MCG/ML INJECTION    1 mL into muscle every 30 days. DX: b12 deficiency   GABAPENTIN (NEURONTIN) 100 MG CAPSULE    Take 3 at supper to prevent restless legs   GLIPIZIDE (GLUCOTROL XL) 5 MG 24 HR TABLET    Take one tablet by mouth once daily to  control blood sugar   GLUCOSE BLOOD (FREESTYLE LITE) TEST STRIP    Use to check blood sugar once daily. Dx E11.43   LANCETS (FREESTYLE) LANCETS    Use to check blood sugar once daily Dx E11.43   MELOXICAM (MOBIC) 15 MG TABLET    Take 15 mg by mouth daily.   METFORMIN (GLUCOPHAGE) 1000 MG TABLET    Take one tablet by mouth twice daily to control blood sugar   MUPIROCIN OINTMENT (BACTROBAN) 2 %    Apply 1 application topically daily. Applies to lesion on scrotum   PANTOPRAZOLE (PROTONIX) 40 MG TABLET    Take 1 tablet (40 mg total) by mouth 2 (two) times daily.   PRAVASTATIN (PRAVACHOL) 20 MG TABLET    Take one tablet by mouth once daily to control cholesterol   SULFAMETHOXAZOLE-TRIMETHOPRIM (BACTRIM DS,SEPTRA DS) 800-160 MG PER TABLET    Take one tablet twice daily for urine infection   SYRINGE/NEEDLE, DISP, (SYRINGE 3CC/25GX1") 25G X 1" 3 ML MISC    Use as directed with Cyanocobalmin   TAMSULOSIN (FLOMAX) 0.4 MG CAPS CAPSULE    Take one tablet by mouth once daily at bedtime to prevent urinary frequency  Modified Medications   No medications on file  Discontinued Medications   No medications on file     Review of Systems  Constitutional: Positive for appetite change (Loss of appetite) and fatigue.       Losing weight  HENT: Positive for hearing loss (Wearing hearing aids) and voice change (Voice is weaker.). Negative for ear pain.        Edentulous  Eyes: Negative.   Respiratory: Positive for cough (Related to recurrent aspiration) and shortness of breath.   Cardiovascular: Negative for chest pain, palpitations and leg swelling.  Gastrointestinal: Positive for constipation.       History of diverticulitis. Mild dysphagia.  Endocrine:       Diabetic.  Genitourinary:       Weak stream. Occasional urinary incontinence.  Musculoskeletal: Positive for myalgias, back pain, arthralgias, gait problem, neck pain and neck stiffness.  Skin: Negative for pallor, rash and wound.       Area on  scrotum that is weeping and raw.  Allergic/Immunologic: Negative.   Neurological: Positive for dizziness, tremors and weakness (Generalized).       Memory loss. Confusion.  Hematological:       Petechia  Psychiatric/Behavioral: Positive for sleep disturbance, dysphoric mood and agitation. The patient is nervous/anxious.     Filed Vitals:   10/17/14 1256  BP: 110/70  Pulse: 71  Temp: 97.3 F (36.3 C)  TempSrc: Oral  Resp: 20  Height: 6' (1.829 m)  Weight: 186 lb 12.8 oz (84.732 kg)  SpO2: 95%   Body mass index is 25.33 kg/(m^2).  Physical Exam  Constitutional: He is oriented to person, place, and time.  Thin. Fragile. Elderly.  HENT:  Head: Normocephalic and atraumatic.  Right Ear: External ear normal.  Left Ear: External ear normal.  Nose: Nose normal.  Mouth/Throat: Oropharynx is clear and moist.  Loss of hearing. Hearing aids. Edentulous.  Eyes: Conjunctivae and EOM are normal. Pupils are equal, round, and reactive to light.  Neck: No JVD present. No tracheal deviation present. No thyromegaly present.  Cardiovascular: Normal rate, regular rhythm, normal heart sounds and intact distal pulses.  Exam reveals no gallop and no friction rub.   No murmur heard. Pulmonary/Chest: Breath sounds normal. No respiratory distress. He has no wheezes. He has no rales.  Abdominal: He exhibits no distension and no mass. There is no tenderness.  Musculoskeletal: He exhibits no edema or tenderness.  Tender in both shoulders. Mild restriction of movement of the shoulders. Unstable gait.  Lymphadenopathy:    He has no cervical adenopathy.  Neurological: He is alert and oriented to person, place, and time. He displays abnormal reflex. No cranial nerve deficit. He exhibits normal muscle tone. Coordination normal.  Diminished DTR bilaterally and patella..  Skin: No rash noted. No erythema. No pallor.  Psychiatric: He has a normal mood and affect. His behavior is normal. Judgment and thought  content normal.     Labs reviewed: Appointment on 10/15/2014  Component Date Value Ref Range Status  . Glucose 10/15/2014 76  65 - 99 mg/dL Final  . BUN 10/15/2014 22  8 - 27 mg/dL Final  . Creatinine, Ser 10/15/2014 1.16  0.76 - 1.27 mg/dL Final  . GFR calc non Af Amer 10/15/2014 59* >59 mL/min/1.73 Final  . GFR calc Af Amer 10/15/2014 68  >59 mL/min/1.73 Final  . BUN/Creatinine Ratio 10/15/2014 19  10 - 22 Final  . Sodium 10/15/2014 140  134 - 144 mmol/L Final  . Potassium 10/15/2014 4.7  3.5 - 5.2 mmol/L Final  . Chloride 10/15/2014 102  97 - 108 mmol/L Final  . CO2 10/15/2014 24  18 -  29 mmol/L Final  . Calcium 10/15/2014 9.2  8.6 - 10.2 mg/dL Final  . Cholesterol, Total 10/15/2014 140  100 - 199 mg/dL Final  . Triglycerides 10/15/2014 131  0 - 149 mg/dL Final  . HDL 10/15/2014 31* >39 mg/dL Final   Comment: According to ATP-III Guidelines, HDL-C >59 mg/dL is considered a negative risk factor for CHD.   Marland Kitchen VLDL Cholesterol Cal 10/15/2014 26  5 - 40 mg/dL Final  . LDL Calculated 10/15/2014 83  0 - 99 mg/dL Final  . Chol/HDL Ratio 10/15/2014 4.5  0.0 - 5.0 ratio units Final   Comment:                                   T. Chol/HDL Ratio                                             Men  Women                               1/2 Avg.Risk  3.4    3.3                                   Avg.Risk  5.0    4.4                                2X Avg.Risk  9.6    7.1                                3X Avg.Risk 23.4   11.0   . Hgb A1c MFr Bld 10/15/2014 6.1* 4.8 - 5.6 % Final   Comment:          Pre-diabetes: 5.7 - 6.4          Diabetes: >6.4          Glycemic control for adults with diabetes: <7.0   . Est. average glucose Bld gHb Est-m* 10/15/2014 128   Final  . Microalbum.,U,Random 10/15/2014 7.7  Not Estab. ug/mL Final  Office Visit on 10/09/2014  Component Date Value Ref Range Status  . Color, UA 10/09/2014 Bronze   Final  . Clarity, UA 10/09/2014 cloudy   Final  . Glucose, UA  10/09/2014 Negative   Final  . Bilirubin, UA 10/09/2014 Large   Final  . Ketones, UA 10/09/2014 Large   Final  . Spec Grav, UA 10/09/2014 <=1.005   Final  . Blood, UA 10/09/2014 Large   Final  . pH, UA 10/09/2014 5.0   Final  . Protein, UA 10/09/2014 30   Final  . Urobilinogen, UA 10/09/2014 0.2   Final  . Nitrite, UA 10/09/2014 Negative   Final  . Leukocytes, UA 10/09/2014 moderate (2+)* Negative Final  . Urine Culture, Routine 10/09/2014 Final report*  Final  . Urine Culture result 1 10/09/2014 Klebsiella pneumoniae*  Final   Greater than 100,000 colony forming units per mL  . ANTIMICROBIAL SUSCEPTIBILITY 10/09/2014 Comment   Final   Comment:       ** S = Susceptible;  I = Intermediate; R = Resistant **                    P = Positive; N = Negative             MICS are expressed in micrograms per mL    Antibiotic                 RSLT#1    RSLT#2    RSLT#3    RSLT#4 Amoxicillin/Clavulanic Acid    S Ampicillin                     R Cefepime                       S Ceftriaxone                    S Cefuroxime                     S Cephalothin                    S Ciprofloxacin                  S Ertapenem                      S Gentamicin                     S Imipenem                       S Levofloxacin                   S Nitrofurantoin                 S Piperacillin                   S Tetracycline                   S Tobramycin                     S Trimethoprim/Sulfa             S   Office Visit on 08/09/2014  Component Date Value Ref Range Status  . WBC 08/09/2014 7.2  3.4 - 10.8 x10E3/uL Final  . RBC 08/09/2014 4.40  4.14 - 5.80 x10E6/uL Final  . Hemoglobin 08/09/2014 13.9  12.6 - 17.7 g/dL Final  . Hematocrit 08/09/2014 41.7  37.5 - 51.0 % Final  . MCV 08/09/2014 95  79 - 97 fL Final  . MCH 08/09/2014 31.6  26.6 - 33.0 pg Final  . MCHC 08/09/2014 33.3  31.5 - 35.7 g/dL Final  . RDW 08/09/2014 13.4  12.3 - 15.4 % Final  . Platelets 08/09/2014 348  150 - 379 x10E3/uL  Final  . Neutrophils 08/09/2014 64   Final  . Lymphs 08/09/2014 26   Final  . Monocytes 08/09/2014 6   Final  . Eos 08/09/2014 3   Final  . Basos 08/09/2014 1   Final  . Neutrophils Absolute 08/09/2014 4.6  1.4 - 7.0 x10E3/uL Final  . Lymphocytes Absolute 08/09/2014 1.9  0.7 - 3.1 x10E3/uL Final  . Monocytes Absolute 08/09/2014 0.4  0.1 - 0.9 x10E3/uL Final  . EOS (ABSOLUTE) 08/09/2014 0.2  0.0 -  0.4 x10E3/uL Final  . Basophils Absolute 08/09/2014 0.0  0.0 - 0.2 x10E3/uL Final  . Immature Granulocytes 08/09/2014 0   Final  . Immature Grans (Abs) 08/09/2014 0.0  0.0 - 0.1 x10E3/uL Final  . Glucose 08/09/2014 111* 65 - 99 mg/dL Final  . BUN 08/09/2014 16  8 - 27 mg/dL Final  . Creatinine, Ser 08/09/2014 0.91  0.76 - 1.27 mg/dL Final  . GFR calc non Af Amer 08/09/2014 79  >59 mL/min/1.73 Final  . GFR calc Af Amer 08/09/2014 91  >59 mL/min/1.73 Final  . BUN/Creatinine Ratio 08/09/2014 18  10 - 22 Final  . Sodium 08/09/2014 140  134 - 144 mmol/L Final  . Potassium 08/09/2014 4.8  3.5 - 5.2 mmol/L Final  . Chloride 08/09/2014 101  97 - 108 mmol/L Final  . CO2 08/09/2014 24  18 - 29 mmol/L Final  . Calcium 08/09/2014 9.8  8.6 - 10.2 mg/dL Final  Admission on 07/28/2014, Discharged on 07/30/2014  Component Date Value Ref Range Status  . WBC 07/28/2014 13.5* 4.0 - 10.5 K/uL Final  . RBC 07/28/2014 4.64  4.22 - 5.81 MIL/uL Final  . Hemoglobin 07/28/2014 14.7  13.0 - 17.0 g/dL Final  . HCT 07/28/2014 43.6  39.0 - 52.0 % Final  . MCV 07/28/2014 94.0  78.0 - 100.0 fL Final  . MCH 07/28/2014 31.7  26.0 - 34.0 pg Final  . MCHC 07/28/2014 33.7  30.0 - 36.0 g/dL Final  . RDW 07/28/2014 13.2  11.5 - 15.5 % Final  . Platelets 07/28/2014 211  150 - 400 K/uL Final  . Sodium 07/28/2014 140  135 - 145 mmol/L Final  . Potassium 07/28/2014 3.7  3.5 - 5.1 mmol/L Final  . Chloride 07/28/2014 105  101 - 111 mmol/L Final  . CO2 07/28/2014 24  22 - 32 mmol/L Final  . Glucose, Bld 07/28/2014 144* 65 - 99  mg/dL Final  . BUN 07/28/2014 18  6 - 20 mg/dL Final  . Creatinine, Ser 07/28/2014 0.70  0.61 - 1.24 mg/dL Final  . Calcium 07/28/2014 9.0  8.9 - 10.3 mg/dL Final  . Total Protein 07/28/2014 6.8  6.5 - 8.1 g/dL Final  . Albumin 07/28/2014 3.8  3.5 - 5.0 g/dL Final  . AST 07/28/2014 23  15 - 41 U/L Final  . ALT 07/28/2014 13* 17 - 63 U/L Final  . Alkaline Phosphatase 07/28/2014 71  38 - 126 U/L Final  . Total Bilirubin 07/28/2014 0.8  0.3 - 1.2 mg/dL Final  . GFR calc non Af Amer 07/28/2014 >60  >60 mL/min Final  . GFR calc Af Amer 07/28/2014 >60  >60 mL/min Final   Comment: (NOTE) The eGFR has been calculated using the CKD EPI equation. This calculation has not been validated in all clinical situations. eGFR's persistently <60 mL/min signify possible Chronic Kidney Disease.   . Anion gap 07/28/2014 11  5 - 15 Final  . Prothrombin Time 07/28/2014 13.7  11.6 - 15.2 seconds Final   DISREGARD RESULT, SPECIMEN CLOTTED  . INR 07/28/2014 1.03  0.00 - 1.49 Final   DISREGARD RESULT, SPECIMEN CLOTTED  . Troponin i, poc 07/28/2014 0.01  0.00 - 0.08 ng/mL Final  . Comment 3 07/28/2014          Final   Comment: Due to the release kinetics of cTnI, a negative result within the first hours of the onset of symptoms does not rule out myocardial infarction with certainty. If myocardial infarction is still suspected, repeat  the test at appropriate intervals.   . Sodium 07/28/2014 141  135 - 145 mmol/L Final  . Potassium 07/28/2014 3.6  3.5 - 5.1 mmol/L Final  . Chloride 07/28/2014 104  101 - 111 mmol/L Final  . BUN 07/28/2014 19  6 - 20 mg/dL Final  . Creatinine, Ser 07/28/2014 0.70  0.61 - 1.24 mg/dL Final  . Glucose, Bld 07/28/2014 151* 65 - 99 mg/dL Final  . Calcium, Ion 07/28/2014 1.12* 1.13 - 1.30 mmol/L Final  . TCO2 07/28/2014 23  0 - 100 mmol/L Final  . Hemoglobin 07/28/2014 16.0  13.0 - 17.0 g/dL Final  . HCT 07/28/2014 47.0  39.0 - 52.0 % Final  . Prothrombin Time 07/28/2014 14.1   11.6 - 15.2 seconds Final  . INR 07/28/2014 1.07  0.00 - 1.49 Final  . aPTT 07/28/2014 27  24 - 37 seconds Final  . FIO2 07/28/2014 0.21   Final  . Delivery systems 07/28/2014 ROOM AIR   Final  . pH, Arterial 07/28/2014 7.449  7.350 - 7.450 Final  . pCO2 arterial 07/28/2014 34.9* 35.0 - 45.0 mmHg Final  . pO2, Arterial 07/28/2014 63.6* 80.0 - 100.0 mmHg Final  . Bicarbonate 07/28/2014 23.8  20.0 - 24.0 mEq/L Final  . TCO2 07/28/2014 20.7  0 - 100 mmol/L Final  . Acid-Base Excess 07/28/2014 0.7  0.0 - 2.0 mmol/L Final  . O2 Saturation 07/28/2014 92.6   Final  . Patient temperature 07/28/2014 98.6   Final  . Collection site 07/28/2014 BRACHIAL ARTERY   Final  . Drawn by 07/28/2014 [id number]   Final  . Sample type 07/28/2014 ARTERIAL   Final  . Troponin I 07/28/2014 0.31* <0.031 ng/mL Final   Comment:        PERSISTENTLY INCREASED TROPONIN VALUES IN THE RANGE OF 0.04-0.49 ng/mL CAN BE SEEN IN:       -UNSTABLE ANGINA       -CONGESTIVE HEART FAILURE       -MYOCARDITIS       -CHEST TRAUMA       -ARRYHTHMIAS       -LATE PRESENTING MYOCARDIAL INFARCTION       -COPD   CLINICAL FOLLOW-UP RECOMMENDED. Performed at [facility]   . Troponin I 07/28/2014 0.34* <0.031 ng/mL Final   Comment:        PERSISTENTLY INCREASED TROPONIN VALUES IN THE RANGE OF 0.04-0.49 ng/mL CAN BE SEEN IN:       -UNSTABLE ANGINA       -CONGESTIVE HEART FAILURE       -MYOCARDITIS       -CHEST TRAUMA       -ARRYHTHMIAS       -LATE PRESENTING MYOCARDIAL INFARCTION       -COPD   CLINICAL FOLLOW-UP RECOMMENDED. Performed at [facility]   . Troponin I 07/28/2014 0.42* <0.031 ng/mL Final   Comment:        PERSISTENTLY INCREASED TROPONIN VALUES IN THE RANGE OF 0.04-0.49 ng/mL CAN BE SEEN IN:       -UNSTABLE ANGINA       -CONGESTIVE HEART FAILURE       -MYOCARDITIS       -CHEST TRAUMA       -ARRYHTHMIAS       -LATE PRESENTING MYOCARDIAL INFARCTION       -COPD   CLINICAL FOLLOW-UP  RECOMMENDED.   . WBC 07/28/2014 14.2* 4.0 - 10.5 K/uL Final  . RBC 07/28/2014 4.77  4.22 - 5.81 MIL/uL Final  .  Hemoglobin 07/28/2014 14.7  13.0 - 17.0 g/dL Final  . HCT 07/28/2014 43.9  39.0 - 52.0 % Final  . MCV 07/28/2014 92.0  78.0 - 100.0 fL Final  . MCH 07/28/2014 30.8  26.0 - 34.0 pg Final  . MCHC 07/28/2014 33.5  30.0 - 36.0 g/dL Final  . RDW 07/28/2014 13.1  11.5 - 15.5 % Final  . Platelets 07/28/2014 200  150 - 400 K/uL Final  . Creatinine, Ser 07/28/2014 0.74  0.61 - 1.24 mg/dL Final  . GFR calc non Af Amer 07/28/2014 >60  >60 mL/min Final  . GFR calc Af Amer 07/28/2014 >60  >60 mL/min Final   Comment: (NOTE) The eGFR has been calculated using the CKD EPI equation. This calculation has not been validated in all clinical situations. eGFR's persistently <60 mL/min signify possible Chronic Kidney Disease. Performed at [facility]   . Hgb A1c MFr Bld 07/28/2014 5.8* 4.8 - 5.6 % Final   Comment: (NOTE)         Pre-diabetes: 5.7 - 6.4         Diabetes: >6.4         Glycemic control for adults with diabetes: <7.0   . Mean Plasma Glucose 07/28/2014 120   Final   Comment: (NOTE) Performed At: [facility] [place], [place] [id number] Lindon Romp MD [id number]   . Glucose-Capillary 07/28/2014 147* 65 - 99 mg/dL Final  . Glucose-Capillary 07/28/2014 170* 65 - 99 mg/dL Final  . Comment 1 07/28/2014 Notify RN   Final  . Comment 2 07/28/2014 Document in Chart   Final  . Glucose-Capillary 07/29/2014 117* 65 - 99 mg/dL Final  . Troponin I 07/29/2014 0.24* <0.031 ng/mL Final   Comment:        PERSISTENTLY INCREASED TROPONIN VALUES IN THE RANGE OF 0.04-0.49 ng/mL CAN BE SEEN IN:       -UNSTABLE ANGINA       -CONGESTIVE HEART FAILURE       -MYOCARDITIS       -CHEST TRAUMA       -ARRYHTHMIAS       -LATE PRESENTING MYOCARDIAL INFARCTION       -COPD   CLINICAL FOLLOW-UP RECOMMENDED.   Marland Kitchen Glucose-Capillary 07/29/2014 216* 65 - 99 mg/dL  Final  . Glucose-Capillary 07/29/2014 177* 65 - 99 mg/dL Final  . Sodium 07/30/2014 139  135 - 145 mmol/L Final  . Potassium 07/30/2014 3.2* 3.5 - 5.1 mmol/L Final  . Chloride 07/30/2014 104  101 - 111 mmol/L Final  . CO2 07/30/2014 26  22 - 32 mmol/L Final  . Glucose, Bld 07/30/2014 122* 65 - 99 mg/dL Final  . BUN 07/30/2014 23* 6 - 20 mg/dL Final  . Creatinine, Ser 07/30/2014 0.92  0.61 - 1.24 mg/dL Final  . Calcium 07/30/2014 9.1  8.9 - 10.3 mg/dL Final  . GFR calc non Af Amer 07/30/2014 >60  >60 mL/min Final  . GFR calc Af Amer 07/30/2014 >60  >60 mL/min Final   Comment: (NOTE) The eGFR has been calculated using the CKD EPI equation. This calculation has not been validated in all clinical situations. eGFR's persistently <60 mL/min signify possible Chronic Kidney Disease.   . Anion gap 07/30/2014 9  5 - 15 Final  . WBC 07/30/2014 11.1* 4.0 - 10.5 K/uL Final  . RBC 07/30/2014 4.09* 4.22 - 5.81 MIL/uL Final  . Hemoglobin 07/30/2014 12.6* 13.0 - 17.0 g/dL Final  . HCT 07/30/2014 37.7* 39.0 - 52.0 %  Final  . MCV 07/30/2014 92.2  78.0 - 100.0 fL Final  . MCH 07/30/2014 30.8  26.0 - 34.0 pg Final  . MCHC 07/30/2014 33.4  30.0 - 36.0 g/dL Final  . RDW 07/30/2014 13.3  11.5 - 15.5 % Final  . Platelets 07/30/2014 181  150 - 400 K/uL Final  . Glucose-Capillary 07/29/2014 127* 65 - 99 mg/dL Final  . Comment 1 07/29/2014 Notify RN   Final  . Comment 2 07/29/2014 Document in Chart   Final  . Glucose-Capillary 07/30/2014 103* 65 - 99 mg/dL Final  . Glucose-Capillary 07/30/2014 220* 65 - 99 mg/dL Final     Assessment/Plan  1. Essential hypertension Continue current medication  2. Hyperlipemia Controlled  3. Hypothyroidism, unspecified hypothyroidism type Compensated - TSH; Future  4. Parkinson's plus syndrome Unchanged  5. Restless legs syndrome (RLS) Improved  6. Type 2 diabetes mellitus with autonomic neuropathy Continue current medication - Hemoglobin A1c; Future -  Comprehensive metabolic panel; Future - Microalbumin, urine; Future  7. Memory loss Unchanged  8. Urinary tract infection associated with catheterization of urinary tract, sequela 10/09/2014. Treated with ciprofloxacin. - Urinalysis, future  9. Pain in joint, shoulder region, left Continue see Dr. Gladstone Lighter as needed

## 2014-10-17 NOTE — Addendum Note (Signed)
Addended by: Lamont Snowball on: 10/17/2014 02:19 PM   Modules accepted: Orders

## 2014-12-20 ENCOUNTER — Ambulatory Visit (INDEPENDENT_AMBULATORY_CARE_PROVIDER_SITE_OTHER): Payer: PPO | Admitting: Family Medicine

## 2014-12-20 VITALS — BP 106/68 | HR 83 | Temp 98.1°F | Resp 16

## 2014-12-20 DIAGNOSIS — R6889 Other general symptoms and signs: Secondary | ICD-10-CM | POA: Diagnosis not present

## 2014-12-20 DIAGNOSIS — M6289 Other specified disorders of muscle: Secondary | ICD-10-CM | POA: Diagnosis not present

## 2014-12-20 DIAGNOSIS — N390 Urinary tract infection, site not specified: Secondary | ICD-10-CM

## 2014-12-20 DIAGNOSIS — R531 Weakness: Secondary | ICD-10-CM

## 2014-12-20 DIAGNOSIS — R8281 Pyuria: Secondary | ICD-10-CM

## 2014-12-20 LAB — POCT URINALYSIS DIP (MANUAL ENTRY)
Glucose, UA: NEGATIVE
Nitrite, UA: NEGATIVE
Protein Ur, POC: 30 — AB
Spec Grav, UA: 1.02
Urobilinogen, UA: 1
pH, UA: 5.5

## 2014-12-20 LAB — POC MICROSCOPIC URINALYSIS (UMFC): Mucus: ABSENT

## 2014-12-20 MED ORDER — CIPROFLOXACIN HCL 500 MG PO TABS: 500.0000 mg | ORAL_TABLET | Freq: Two times a day (BID) | ORAL | Status: DC

## 2014-12-20 MED ORDER — CEFTRIAXONE SODIUM 1 G IJ SOLR
1.0000 g | Freq: Once | INTRAMUSCULAR | Status: AC
  Administered 2014-12-20: 1 g via INTRAMUSCULAR

## 2014-12-20 NOTE — Progress Notes (Addendum)
Subjective:  This chart was scribed for Nicholas Sidle MD, by Veverly Fells, at [facility].  This patient was seen in room 14 and the patient's care was started at 1:17 PM.    Chief Complaint  Patient presents with   Fever    This morning   Difficulty Moving    Pt woke up this morning and said he couldn't move   Dark Urine    X this morning     Patient ID: Nicholas Barnett, male    DOB: 08/27/1931, 81 y.o.   MRN: [id number]  HPI  HPI Comments: Nicholas Barnett is a 81 y.o. male with a history of Parkinson's who presents to the [facility] complaining of having the chills onset last night when he woke up in the middle of the night.  Patient states that when he woke up this morning, he felt like he couldn't move easily.  He also states that his right side is more difficult to move than his left side.  He notes of dark urine this morning.  Per wife, she states that he has not been moving around much lately.  He has a mild cough which he has had for a while now.  He denies any dysuria.  Patient denies nausea/vomitting or hematuria.      Patient Active Problem List   Diagnosis Date Noted   UTI (urinary tract infection) 10/17/2014   Pain in the chest    Chest pain 07/28/2014   Abnormal EKG 07/28/2014   Insomnia 10/17/2013   Pain in joint, shoulder region 01/25/2013   Dysphagia, idiopathic    Recurrent falls    Dizzy    Hearing loss of both ears    Neck pain    Right knee pain    Thoracic back pain    Lumbar back pain    Shoulder pain    Cyanocobalamin deficiency    Type 2 diabetes mellitus with autonomic neuropathy (HCC)    Restless legs syndrome (RLS)    Parkinson's plus syndrome (HCC) 11/24/2012   Gait difficulty 11/24/2012   NOCTURIA 05/08/2009   SOMNOLENCE 04/11/2009   Memory loss 04/11/2009   ECZEMA 03/21/2009   HYPERTROPHY PROSTATE W/UR OBST & OTH LUTS 12/25/2008   OSTEOARTHRITIS, KNEE, RIGHT  12/25/2008   OSTEOPOROSIS 12/25/2008   URINARY URGENCY 12/25/2008   VITAMIN D DEFICIENCY 12/22/2007   DIVERTICULITIS, HX OF 06/01/2007   Hypothyroidism 12/16/2006   Hyperlipemia 12/16/2006   ERECTILE DYSFUNCTION 12/16/2006   Essential hypertension 12/16/2006   Past Medical History  Diagnosis Date   Type 2 diabetes mellitus with autonomic neuropathy (HCC)    Hyperlipidemia    Hypertension    Arthritis    Diverticulitis    Parkinsonism (HCC)    Right knee pain    Memory loss    Depression    Gait disturbance    Restless leg    Thyroid disease    Vitamin D deficiency    Osteoarthritis    Osteoporosis    Dysphagia, idiopathic     History of aspiration   Recurrent falls     Using a cane and walker   Dizzy    Hearing loss of both ears    Neck pain    Thoracic back pain    Lumbar back pain    Shoulder pain    Cyanocobalamin deficiency    Other and unspecified hyperlipidemia    Unspecified essential hypertension    Abnormality of  gait    Unspecified hypothyroidism    Restless legs syndrome (RLS)    Bifascicular block    Past Surgical History  Procedure Laterality Date   Cataract extraction Bilateral 2012    Dr. Hazle Quantigby   Allergies  Allergen Reactions   Caffeine Swelling    Joint swelling    Prior to Admission medications   Medication Sig Start Date End Date Taking? Authorizing Provider  amLODipine (NORVASC) 5 MG tablet Take one tablet by mouth once daily to control blood pressure 04/26/14  Yes [name], MD  aspirin 81 MG tablet Take 81 mg by mouth every morning.    Yes Historical Provider, MD  benazepril (LOTENSIN) 5 MG tablet Take one tablet by mouth once daily for blood pressure 04/26/14  Yes [name], MD  Blood Glucose Monitoring Suppl (FREESTYLE LITE) DEVI Use to check blood sugar once daily Dx E11.43 03/19/14  Yes [name], MD  Calcium Carbonate-Vitamin D (CALCIUM-CARB 600 + D) 600-125 MG-UNIT TABS Take 1  tablet by mouth 2 (two) times daily.    Yes Historical Provider, MD  carbidopa-levodopa (SINEMET IR) 25-100 MG per tablet Three tablets morning, three tablets in the afternoon, one tablet at night 09/18/14  Yes Rebecca S Tat, DO  cyanocobalamin (,VITAMIN B-12,) 1000 MCG/ML injection 1 mL into muscle every 30 days. DX: b12 deficiency 11/02/13  Yes Sharon SellerJessica K Eubanks, NP  gabapentin (NEURONTIN) 100 MG capsule Take 3 at supper to prevent restless legs Patient taking differently: Take 300 mg by mouth at bedtime. To prevent restless legs 04/26/14  Yes [name], MD  glipiZIDE (GLUCOTROL XL) 5 MG 24 hr tablet Take one tablet by mouth once daily to control blood sugar 04/26/14  Yes [name], MD  glucose blood (FREESTYLE LITE) test strip Use to check blood sugar once daily. Dx E11.43 03/19/14  Yes [name], MD  Lancets (FREESTYLE) lancets Use to check blood sugar once daily Dx E11.43 03/19/14  Yes [name], MD  meloxicam (MOBIC) 15 MG tablet Take 15 mg by mouth daily.   Yes Historical Provider, MD  metFORMIN (GLUCOPHAGE) 1000 MG tablet Take one tablet by mouth twice daily to control blood sugar 04/26/14  Yes [name], MD  mupirocin ointment (BACTROBAN) 2 % Apply 1 application topically daily. Applies to lesion on scrotum   Yes Historical Provider, MD  pantoprazole (PROTONIX) 40 MG tablet Take 1 tablet (40 mg total) by mouth 2 (two) times daily. 07/30/14  Yes Ripudeep Jenna LuoK Rai, MD  pravastatin (PRAVACHOL) 20 MG tablet Take one tablet by mouth once daily to control cholesterol 05/01/14  Yes [name], MD  Syringe/Needle, Disp, (SYRINGE 3CC/25GX1") 25G X 1" 3 ML MISC Use as directed with Cyanocobalmin 03/14/13  Yes Mahima Glade LloydPandey, MD  tamsulosin (FLOMAX) 0.4 MG CAPS capsule Take one tablet by mouth once daily at bedtime to prevent urinary frequency 04/26/14  Yes [name], MD   Social History   Social History   Marital Status: Married    Spouse Name: [name]   Number of Children: 3    Years of Education: Automotive engineerCollege   Occupational History   Retired     Manager/ [employer]   Social History Main Topics   Smoking status: Former Smoker    Quit date: 01/31/1974   Smokeless tobacco: Never Used   Alcohol Use: No   Drug Use: No   Sexual Activity: No   Other Topics Concern   Not  on file   Social History Narrative   Married 1956. Returned from a Production designer, theatre/television/film for [employer].   Patient lives at home with his family.   Caffeine Use: none   Patient has a living will and healthcare power of attorney.       Review of Systems  Constitutional: Positive for fever and chills.  Respiratory: Positive for cough. Negative for choking.   Gastrointestinal: Negative for nausea and vomiting.  Genitourinary: Negative for dysuria and difficulty urinating.  Musculoskeletal: Negative for neck pain and neck stiffness.  Neurological: Negative for syncope.       Objective:   Physical Exam  Constitutional: No distress.  Eyes: Pupils are equal, round, and reactive to light.  Cardiovascular: Normal rate.  Exam reveals no gallop.   No murmur heard. Pulmonary/Chest: Breath sounds normal. No respiratory distress. He has no wheezes. He has no rales.  Skin: Skin is warm and dry.   Filed Vitals:   12/20/14 1227  BP: 106/68  Pulse: 83  Temp: 98.1 F (36.7 C)  TempSrc: Oral  Resp: 16  SpO2: 97%   Patient has flat facies. He is alert and conversant. There is no acute distress. He demonstrates and start hesitation. He does have good strength in both upper extremities and lower extremities although he had states to flex his right knee because of arthritis.  Abdomen is soft, nontender, without guarding or rebound. He has no CVA tenderness Skin shows no rash Neck is supple no adenopathy Extraocular motion is normal, tongue is midline, pharynx rises midline, facial movement is normal with extraction of motion intact. Grasps are equal. Lower extremity movement shows no weakness and there is no  drift.   Results for orders placed or performed in visit on 12/20/14  POCT urinalysis dipstick  Result Value Ref Range   Color, UA brown (A) yellow   Clarity, UA cloudy (A) clear   Glucose, UA negative negative   Bilirubin, UA small (A) negative   Ketones, POC UA small (15) (A) negative   Spec Grav, UA 1.020    Blood, UA trace-lysed (A) negative   pH, UA 5.5    Protein Ur, POC =30 (A) negative   Urobilinogen, UA 1.0    Nitrite, UA Negative Negative   Leukocytes, UA small (1+) (A) Negative  POCT Microscopic Urinalysis ([facility])  Result Value Ref Range   WBC,UR,HPF,POC Moderate (A) None WBC/hpf   RBC,UR,HPF,POC Few (A) None RBC/hpf   Bacteria Many (A) None, Too numerous to count   Mucus Absent Absent   Epithelial Cells, UR Per Microscopy Few (A) None, Too numerous to count cells/hpf       Assessment & Plan:   This chart was scribed in my presence and reviewed by me personally.    ICD-9-CM ICD-10-CM   1. Rigors 780.99 R68.89 POCT urinalysis dipstick     POCT Microscopic Urinalysis ([facility])     Urine culture     cefTRIAXone (ROCEPHIN) injection 1 g     ciprofloxacin (CIPRO) 500 MG tablet     DISCONTINUED: ciprofloxacin (CIPRO) 500 MG tablet  2. Right sided weakness 728.87 M62.89 POCT urinalysis dipstick     POCT Microscopic Urinalysis ([facility])     Urine culture     cefTRIAXone (ROCEPHIN) injection 1 g     ciprofloxacin (CIPRO) 500 MG tablet     DISCONTINUED: ciprofloxacin (CIPRO) 500 MG tablet  3. Pyuria 791.9 N39.0 cefTRIAXone (ROCEPHIN) injection 1 g     ciprofloxacin (CIPRO) 500 MG tablet  POCT urinalysis dipstick     POCT Microscopic Urinalysis ([facility])     Urine culture     DISCONTINUED: ciprofloxacin (CIPRO) 500 MG tablet     Signed, Nicholas Sidle, MD

## 2014-12-22 LAB — URINE CULTURE: Colony Count: >=100000

## 2015-01-15 ENCOUNTER — Other Ambulatory Visit: Payer: Self-pay | Admitting: *Deleted

## 2015-01-15 MED ORDER — PANTOPRAZOLE SODIUM 40 MG PO TBEC: 40.0000 mg | DELAYED_RELEASE_TABLET | Freq: Two times a day (BID) | ORAL | Status: DC

## 2015-02-21 ENCOUNTER — Other Ambulatory Visit: Payer: Self-pay

## 2015-02-21 MED ORDER — PANTOPRAZOLE SODIUM 40 MG PO TBEC: 40.0000 mg | DELAYED_RELEASE_TABLET | Freq: Two times a day (BID) | ORAL | Status: DC

## 2015-02-22 ENCOUNTER — Other Ambulatory Visit: Payer: PPO

## 2015-02-27 ENCOUNTER — Ambulatory Visit: Payer: PPO | Admitting: Internal Medicine

## 2015-03-22 ENCOUNTER — Other Ambulatory Visit: Payer: Self-pay | Admitting: *Deleted

## 2015-03-22 MED ORDER — FREESTYLE LANCETS MISC
Status: DC
Start: 2015-03-22 — End: 2016-01-19

## 2015-03-22 MED ORDER — GLUCOSE BLOOD VI STRP
ORAL_STRIP | Status: DC
Start: 2015-03-22 — End: 2016-01-19

## 2015-03-22 NOTE — Telephone Encounter (Signed)
Wife Requested and faxed to pharmacy.

## 2015-04-15 ENCOUNTER — Other Ambulatory Visit: Payer: PPO

## 2015-04-15 DIAGNOSIS — E039 Hypothyroidism, unspecified: Secondary | ICD-10-CM

## 2015-04-15 DIAGNOSIS — E1143 Type 2 diabetes mellitus with diabetic autonomic (poly)neuropathy: Secondary | ICD-10-CM

## 2015-04-16 LAB — COMPREHENSIVE METABOLIC PANEL
ALBUMIN: 4.3 g/dL (ref 3.5–4.7)
ALK PHOS: 86 IU/L (ref 39–117)
ALT: 15 IU/L (ref 0–44)
AST: 15 IU/L (ref 0–40)
Albumin/Globulin Ratio: 1.9 (ref 1.1–2.5)
BUN / CREAT RATIO: 17 (ref 10–22)
BUN: 17 mg/dL (ref 8–27)
Bilirubin Total: 0.7 mg/dL (ref 0.0–1.2)
CO2: 24 mmol/L (ref 18–29)
CREATININE: 0.98 mg/dL (ref 0.76–1.27)
Calcium: 9.9 mg/dL (ref 8.6–10.2)
Chloride: 100 mmol/L (ref 96–106)
GFR calc non Af Amer: 72 mL/min/{1.73_m2} (ref >59)
GFR, EST AFRICAN AMERICAN: 83 mL/min/{1.73_m2} (ref >59)
Globulin, Total: 2.3 g/dL (ref 1.5–4.5)
Glucose: 114 mg/dL — ABNORMAL HIGH (ref 65–99)
Potassium: 3.8 mmol/L (ref 3.5–5.2)
SODIUM: 144 mmol/L (ref 134–144)
TOTAL PROTEIN: 6.6 g/dL (ref 6.0–8.5)

## 2015-04-16 LAB — MICROALBUMIN, URINE: Microalbumin, Urine: 39.7 ug/mL

## 2015-04-16 LAB — HEMOGLOBIN A1C
Est. average glucose Bld gHb Est-mCnc: 117 mg/dL
Hgb A1c MFr Bld: 5.7 % — ABNORMAL HIGH (ref 4.8–5.6)

## 2015-04-16 LAB — TSH: TSH: 1.76 u[IU]/mL (ref 0.450–4.500)

## 2015-04-17 ENCOUNTER — Encounter: Payer: Self-pay | Admitting: Internal Medicine

## 2015-04-17 ENCOUNTER — Ambulatory Visit (INDEPENDENT_AMBULATORY_CARE_PROVIDER_SITE_OTHER): Payer: PPO | Admitting: Internal Medicine

## 2015-04-17 VITALS — BP 100/70 | HR 79 | Temp 97.7°F | Resp 20 | Ht 72.0 in | Wt 188.2 lb

## 2015-04-17 DIAGNOSIS — E538 Deficiency of other specified B group vitamins: Secondary | ICD-10-CM | POA: Diagnosis not present

## 2015-04-17 DIAGNOSIS — R269 Unspecified abnormalities of gait and mobility: Secondary | ICD-10-CM

## 2015-04-17 DIAGNOSIS — M545 Low back pain, unspecified: Secondary | ICD-10-CM

## 2015-04-17 DIAGNOSIS — M25512 Pain in left shoulder: Secondary | ICD-10-CM | POA: Diagnosis not present

## 2015-04-17 DIAGNOSIS — M25561 Pain in right knee: Secondary | ICD-10-CM

## 2015-04-17 DIAGNOSIS — E785 Hyperlipidemia, unspecified: Secondary | ICD-10-CM | POA: Diagnosis not present

## 2015-04-17 DIAGNOSIS — R413 Other amnesia: Secondary | ICD-10-CM | POA: Diagnosis not present

## 2015-04-17 DIAGNOSIS — I10 Essential (primary) hypertension: Secondary | ICD-10-CM | POA: Diagnosis not present

## 2015-04-17 DIAGNOSIS — N400 Enlarged prostate without lower urinary tract symptoms: Secondary | ICD-10-CM

## 2015-04-17 DIAGNOSIS — K2100 Gastro-esophageal reflux disease with esophagitis, without bleeding: Secondary | ICD-10-CM

## 2015-04-17 DIAGNOSIS — E039 Hypothyroidism, unspecified: Secondary | ICD-10-CM | POA: Diagnosis not present

## 2015-04-17 DIAGNOSIS — E1143 Type 2 diabetes mellitus with diabetic autonomic (poly)neuropathy: Secondary | ICD-10-CM | POA: Diagnosis not present

## 2015-04-17 DIAGNOSIS — G2581 Restless legs syndrome: Secondary | ICD-10-CM | POA: Diagnosis not present

## 2015-04-17 DIAGNOSIS — G20C Parkinsonism, unspecified: Secondary | ICD-10-CM

## 2015-04-17 DIAGNOSIS — G232 Striatonigral degeneration: Secondary | ICD-10-CM | POA: Diagnosis not present

## 2015-04-17 DIAGNOSIS — K219 Gastro-esophageal reflux disease without esophagitis: Secondary | ICD-10-CM | POA: Insufficient documentation

## 2015-04-17 MED ORDER — PRAVASTATIN SODIUM 20 MG PO TABS: ORAL_TABLET | ORAL | Status: DC

## 2015-04-17 MED ORDER — METFORMIN HCL 1000 MG PO TABS: ORAL_TABLET | ORAL | Status: DC

## 2015-04-17 MED ORDER — BENAZEPRIL HCL 5 MG PO TABS: ORAL_TABLET | ORAL | Status: DC

## 2015-04-17 MED ORDER — CYANOCOBALAMIN 1000 MCG/ML IJ SOLN: INTRAMUSCULAR | Status: DC

## 2015-04-17 MED ORDER — GABAPENTIN 100 MG PO CAPS: ORAL_CAPSULE | ORAL | Status: DC

## 2015-04-17 MED ORDER — PANTOPRAZOLE SODIUM 40 MG PO TBEC: DELAYED_RELEASE_TABLET | ORAL | Status: DC

## 2015-04-17 MED ORDER — TAMSULOSIN HCL 0.4 MG PO CAPS: ORAL_CAPSULE | ORAL | Status: DC

## 2015-04-17 MED ORDER — CARBIDOPA-LEVODOPA 25-100 MG PO TABS: ORAL_TABLET | ORAL | Status: DC

## 2015-04-17 MED ORDER — AMLODIPINE BESYLATE 5 MG PO TABS: ORAL_TABLET | ORAL | Status: DC

## 2015-04-17 MED ORDER — "SYRINGE 25G X 1"" 3 ML MISC": Status: DC

## 2015-04-17 MED ORDER — GLIPIZIDE ER 5 MG PO TB24: ORAL_TABLET | ORAL | Status: DC

## 2015-04-17 NOTE — Progress Notes (Signed)
Patient ID: Nicholas Barnett, male   DOB: [birth year], 82 y.o.   MRN: [id number]    Facility  [facility]    Place of Service:   OFFICE    Allergies  Allergen Reactions  . Caffeine Swelling    Joint swelling     Chief Complaint  Patient presents with  . Medical Management of Chronic Issues    6 month follow-up, Labs printed  . OTHER    Wife in room with patient     HPI:  Got steroid injections shoulders and knees by Dr. Gladstone Lighter in the past. No help.  Diabetes is under good control.  Having more reflux symptoms. tums relieves.   Cyanocobalamin deficiency - getting cyanocobalamin (,VITAMIN B-12,) 1000 MCG/ML injection  Essential hypertension - controlled  Hyperlipemia -controlled   Hypothyroidism, unspecified hypothyroidism type - Compensated  Memory loss - About the same as last visit  Parkinson's plus syndrome ([place]) - High risk for falls. Using a cane. No recent falls.  Restless legs syndrome (RLS) -Benefits from gabapentin at bedtime    Type 2 diabetes mellitus with autonomic neuropathy, unspecified long term insulin use status (HCC) - Controlled   Gait difficulty - Continues to use cane   BPH (benign prostatic hyperplasia) -Some frequency in the day. Denies dysuria.     Midline low back pain without sciatica - improved with little discomfort in the lower back.   Pain in joint of left shoulder continues with pain in shoulders. Previous injections, arthritis creams, and arthritis gels have not been helpful.  -   Right knee pain - contributes to gait instability .no effusion .    Medications: Patient's Medications  New Prescriptions   No medications on file  Previous Medications   AMLODIPINE (NORVASC) 5 MG TABLET    Take one tablet by mouth once daily to control blood pressure   ASPIRIN 81 MG TABLET    Take 81 mg by mouth every morning.    BENAZEPRIL (LOTENSIN) 5 MG TABLET    Take one tablet by mouth once daily for blood pressure   BLOOD GLUCOSE MONITORING SUPPL  (FREESTYLE LITE) DEVI    Use to check blood sugar once daily Dx E11.43   CALCIUM CARBONATE-VITAMIN D (CALCIUM-CARB 600 + D) 600-125 MG-UNIT TABS    Take 1 tablet by mouth 2 (two) times daily.    CARBIDOPA-LEVODOPA (SINEMET IR) 25-100 MG PER TABLET    Three tablets morning, three tablets in the afternoon, one tablet at night   CYANOCOBALAMIN (,VITAMIN B-12,) 1000 MCG/ML INJECTION    1 mL into muscle every 30 days. DX: b12 deficiency   FLUOROMETHOLONE (FML) 0.1 % OPHTHALMIC SUSPENSION       GABAPENTIN (NEURONTIN) 100 MG CAPSULE    Take 3 at supper to prevent restless legs   GLIPIZIDE (GLUCOTROL XL) 5 MG 24 HR TABLET    Take one tablet by mouth once daily to control blood sugar   GLUCOSE BLOOD (FREESTYLE LITE) TEST STRIP    Use to check blood sugar once daily. Dx E11.43   LANCETS (FREESTYLE) LANCETS    Use to check blood sugar once daily Dx E11.43   METFORMIN (GLUCOPHAGE) 1000 MG TABLET    Take one tablet by mouth twice daily to control blood sugar   MUPIROCIN OINTMENT (BACTROBAN) 2 %    Apply 1 application topically daily. Applies to lesion on scrotum   PANTOPRAZOLE (PROTONIX) 40 MG TABLET    Take 1 tablet (40 mg total) by mouth 2 (two) times  daily.   PRAVASTATIN (PRAVACHOL) 20 MG TABLET    Take one tablet by mouth once daily to control cholesterol   SYRINGE/NEEDLE, DISP, (SYRINGE 3CC/25GX1") 25G X 1" 3 ML MISC    Use as directed with Cyanocobalmin   TAMSULOSIN (FLOMAX) 0.4 MG CAPS CAPSULE    Take one tablet by mouth once daily at bedtime to prevent urinary frequency  Modified Medications   No medications on file  Discontinued Medications   CIPROFLOXACIN (CIPRO) 500 MG TABLET    Take 1 tablet (500 mg total) by mouth 2 (two) times daily.   MELOXICAM (MOBIC) 15 MG TABLET    Take 15 mg by mouth daily. Reported on 04/17/2015    Review of Systems  Constitutional: Positive for appetite change (Loss of appetite) and fatigue.       Regaining weight  HENT: Positive for hearing loss (Wearing hearing  aids) and voice change (Voice is weaker.). Negative for ear pain.        Edentulous  Eyes: Negative.   Respiratory: Positive for cough (Related to recurrent aspiration) and shortness of breath.   Cardiovascular: Negative for chest pain, palpitations and leg swelling.  Gastrointestinal: Positive for constipation.       History of diverticulitis. Mild dysphagia. Burns when he lays down at night.  Endocrine:       Diabetic.  Genitourinary:       Weak stream. Occasional urinary incontinence.  Musculoskeletal: Positive for myalgias, back pain, arthralgias, gait problem, neck pain and neck stiffness.       Shoulder pains. Nothing seems to help.  Skin: Negative for pallor, rash and wound.  Allergic/Immunologic: Negative.   Neurological: Positive for dizziness, tremors and weakness (Generalized).       Memory loss. Confusion.  Hematological:       Petechia  Psychiatric/Behavioral: Positive for sleep disturbance, dysphoric mood and agitation. The patient is nervous/anxious.     Filed Vitals:   04/17/15 1124  BP: 100/70  Pulse: 79  Temp: 97.7 F (36.5 C)  TempSrc: Oral  Resp: 20  Height: 6' (1.829 m)  Weight: 188 lb 3.2 oz (85.367 kg)  SpO2: 95%   Body mass index is 25.52 kg/(m^2). Filed Weights   04/17/15 1124  Weight: 188 lb 3.2 oz (85.367 kg)     Physical Exam  Constitutional: He is oriented to person, place, and time.  Thin. Fragile. Elderly.  HENT:  Head: Normocephalic and atraumatic.  Right Ear: External ear normal.  Left Ear: External ear normal.  Nose: Nose normal.  Mouth/Throat: Oropharynx is clear and moist.  Loss of hearing. Hearing aids. Edentulous.  Eyes: Conjunctivae and EOM are normal. Pupils are equal, round, and reactive to light.  Neck: No JVD present. No tracheal deviation present. No thyromegaly present.  Cardiovascular: Normal rate, regular rhythm, normal heart sounds and intact distal pulses.  Exam reveals no gallop and no friction rub.   No murmur  heard. Pulmonary/Chest: Breath sounds normal. No respiratory distress. He has no wheezes. He has no rales.  Abdominal: He exhibits no distension and no mass. There is no tenderness.  Musculoskeletal: He exhibits no edema or tenderness.  Tender in both shoulders. Mild restriction of movement of the shoulders. Unstable gait.  Lymphadenopathy:    He has no cervical adenopathy.  Neurological: He is alert and oriented to person, place, and time. He displays abnormal reflex. No cranial nerve deficit. He exhibits normal muscle tone. Coordination normal.  Diminished DTR bilaterally and patella..  Skin: No rash noted.  No erythema. No pallor.  Psychiatric: He has a normal mood and affect. His behavior is normal. Judgment and thought content normal.    Labs reviewed: Lab Summary Latest Ref Rng 04/15/2015 10/15/2014 08/09/2014 07/30/2014  Hemoglobin 12.6 - 17.7 g/dL (None) (None) 13.9 12.6(L)  Hematocrit 37.5 - 51.0 % (None) (None) 41.7 37.7(L)  White count 3.4 - 10.8 x10E3/uL (None) (None) 7.2 11.1(H)  Platelet count 150 - 379 x10E3/uL (None) (None) 348 181  Sodium 134 - 144 mmol/L 144 140 140 139  Potassium 3.5 - 5.2 mmol/L 3.8 4.7 4.8 3.2(L)  Calcium 8.6 - 10.2 mg/dL 9.9 9.2 9.8 9.1  Phosphorus - (None) (None) (None) (None)  Creatinine 0.76 - 1.27 mg/dL 0.98 1.16 0.91 0.92  AST 0 - 40 IU/L 15 (None) (None) (None)  Alk Phos 39 - 117 IU/L 86 (None) (None) (None)  Bilirubin 0.0 - 1.2 mg/dL 0.7 (None) (None) (None)  Glucose 65 - 99 mg/dL 114(H) 76 111(H) 122(H)  Cholesterol - (None) (None) (None) (None)  HDL cholesterol >39 mg/dL (None) 31(L) (None) (None)  Triglycerides 0 - 149 mg/dL (None) 131 (None) (None)  LDL Direct - (None) (None) (None) (None)  LDL Calc 0 - 99 mg/dL (None) 83 (None) (None)  Total protein - (None) (None) (None) (None)  Albumin 3.5 - 4.7 g/dL 4.3 (None) (None) (None)   Lab Results  Component Value Date   TSH 1.760 04/15/2015   TSH 0.98 05/13/2012   TSH 1.06 06/30/2011    Lab Results  Component Value Date   BUN 17 04/15/2015   BUN 22 10/15/2014   BUN 16 08/09/2014   Lab Results  Component Value Date   HGBA1C 5.7* 04/15/2015   HGBA1C 6.1* 10/15/2014   HGBA1C 5.8* 07/28/2014    Assessment/Plan  1. Cyanocobalamin deficiency - cyanocobalamin (,VITAMIN B-12,) 1000 MCG/ML injection; 1 mL into muscle every 30 days. DX: b12 deficiency  Dispense: 10 mL; Refill: 3  2. Essential hypertension - amLODipine (NORVASC) 5 MG tablet; Take one tablet by mouth once daily to control blood pressure  Dispense: 90 tablet; Refill: 4 - benazepril (LOTENSIN) 5 MG tablet; Take one tablet by mouth once daily for blood pressure  Dispense: 90 tablet; Refill: 4 - Comprehensive metabolic panel; Future  3. Hyperlipemia - pravastatin (PRAVACHOL) 20 MG tablet; Take one tablet by mouth once daily to control cholesterol  Dispense: 90 tablet; Refill: 3 - Lipid panel; Future  4. Hypothyroidism, unspecified hypothyroidism type - TSH; Future  5. Memory loss observe. MMSE next visit.   6 . Parkinson's plus syndrome (HCC)  Continue Sinemet  - carbidopa-levodopa (SINEMET IR) 25-100 MG tablet; Take 4 tablets in the morning and 3 in the evening to control Parkinsonism  Dispense: 630 tablet; Refill: 4  7. Restless legs syndrome (RLS) - gabapentin (NEURONTIN) 100 MG capsule; Take 3 at supper to prevent restless legs  Dispense: 270 capsule; Refill: 4  8. Type 2 diabetes mellitus with autonomic neuropathy, unspecified long term insulin use status (HCC) - glipiZIDE (GLUCOTROL XL) 5 MG 24 hr tablet; Take one tablet by mouth once daily to control blood sugar  Dispense: 90 tablet; Refill: 3 - metFORMIN (GLUCOPHAGE) 1000 MG tablet; Take one tablet by mouth twice daily to control blood sugar  Dispense: 180 tablet; Refill: 3 - Hemoglobin A1c; Future - Comprehensive metabolic panel; Future - Microalbumin, urine; Future  9. Gait difficulty  Continue use of cane  10. BPH (benign prostatic  hyperplasia) - tamsulosin (FLOMAX) 0.4 MG CAPS capsule; Take one tablet by  mouth once daily at bedtime to prevent urinary frequency  Dispense: 90 capsule; Refill: 4  11. Midline low back pain without sciatica  Improved  12. Pain in joint of left shoulder  Chronic. Wife would like him see orthopedist, but he does want to do this.  13. Right knee pain chronic.   1 4. Gastroesophageal reflux disease with esophagitis Resume use of - pantoprazole (PROTONIX) 40 MG tablet; Take one tablet up to twice daily to suppress acid secretion  Dispense: 180 tablet; Refill: 4

## 2015-06-13 ENCOUNTER — Telehealth: Payer: Self-pay | Admitting: Neurology

## 2015-06-13 NOTE — Telephone Encounter (Signed)
Nicholas HaringDavid Barnett [birth year]. His wife called concerned that he is depressed. She said he has a lot of anxiety, depressed and sits around a lot. She would like to see if we can bring him in any sooner. His next appointment is in July. Her number is [phone]. His wife said she will be out of town next Monday through Friday. Thank you

## 2015-06-14 NOTE — Telephone Encounter (Signed)
Should patient be seen sooner here or start with PCP?

## 2015-06-14 NOTE — Telephone Encounter (Signed)
Happy to see him sooner since haven't seen him in a long time.  May need to refer elsewhere if depression but happy to see

## 2015-06-14 NOTE — Telephone Encounter (Signed)
LMOM making patient/wife aware they can call the office and move appt sooner.

## 2015-07-02 ENCOUNTER — Ambulatory Visit (INDEPENDENT_AMBULATORY_CARE_PROVIDER_SITE_OTHER): Payer: PPO | Admitting: Neurology

## 2015-07-02 ENCOUNTER — Encounter: Payer: Self-pay | Admitting: Neurology

## 2015-07-02 VITALS — BP 134/68 | HR 92 | Ht 72.0 in | Wt 192.0 lb

## 2015-07-02 DIAGNOSIS — F330 Major depressive disorder, recurrent, mild: Secondary | ICD-10-CM

## 2015-07-02 DIAGNOSIS — G20 Parkinson's disease: Secondary | ICD-10-CM

## 2015-07-02 MED ORDER — MIRTAZAPINE 15 MG PO TABS: 15.0000 mg | ORAL_TABLET | Freq: Every day | ORAL | Status: DC

## 2015-07-02 NOTE — Patient Instructions (Signed)
1. You have been referred to [facility]. They will call you directly to schedule an appointment.  Please call [phone] if you do not hear from them.  2. Start Remeron - 1 tablet at night. Prescription sent to your pharmacy.

## 2015-07-02 NOTE — Progress Notes (Signed)
Nicholas Barnett L Boyan was seen today in the movement disorders clinic for neurologic consultation at the request of GREEN, Lenon CurtARTHUR G, MD.  The consultation is for the evaluation of Parkinsonism.  The patient has previously seen Dr. Sandria ManlyLove and Dr. Marjory LiesPenumalli.  Records were reviewed from both of those physicians.  The patient's symptoms started in 2010.  First symptoms consisted of dizziness and falls.  He began to see Dr. Sandria ManlyLove around the time that symptoms began.  He first had an MRI of the brain in February 2011 that demonstrated rather significant atrophy.  B12 at that time was found to be low at 181.  He had a swallow study in June, 2011 that demonstrated moderate dysphagia with intermittent aspiration of thin liquids.  He was tried on levodopa (I do not know what dose and they don't even remember the medication or how long he was on it) and that did not seem to help.  He was later tried on ropinirole which also did not help.  He was tried on, and is still on, gabapentin for restless leg syndrome.  His most recent MRI of the brain was on 11/21/2013 and I reviewed this as well as his previous MRI of the brain.  There remains very significant atrophy, although there is not significant basal ganglia disease.  There is mild to moderate small vessel disease.  02/13/14 update:  Pt was seen today for UPDRS motor on/off testing.  He is accompanied by his family who supplements the hx.  Pt had MBE on 02/05/14 and demonstrated mild oral and mod pharyngeal phase dysphagia and mechanical soft with honey thick liquid was recommended.  He is still awaiting ST to contact him (out pt).  05/15/14 update:  The patient is accompanied by his family supplements the history.  Patient has a history of multiple system atrophy.  He has been minimally responsive to levodopa via UPDRS on/off last visit but they wanted to retry the medication at last visit and he was told to take 2 tablets at 11 AM (wakeup time) 2 at 3 PM and 1 at 7 PM.  Pt  states that last visit he walked in here with a walker and left without it after the on/off test.  Has felt much better ever since.  He was doing well in PT but pt states that the last time he went he got put on a machine that hurt his shoulder.  His wife does state that the levodopa has helped him remarkably.  He is now able to do much of his own dressing and bathing and he was not able to do that previously.  No falls since our last visit.  Pt states that he could not deal with the thickened food and understands the risks of not following the recommended diet.  He is, however, eating better and has a much better appetite.  He has gained some weight.  He did not get speech therapy; wife states that they were to call back to set this up but didn't.    09/18/14 update:  The patient presents today, accompanied by his wife who supplements the history.  The patients records were reviewed since last visit.  Despite the fact that the patient's UPDRS motor on/off test did not show much benefit with levodopa, the patient and his wife think that the levodopa has been very beneficial at home and helped freezing.  Therefore, he has been on carbidopa/levodopa 25/100, 2 tablets in the morning, 2 in the afternoon  and one towards evening.  He doesn't think that's its working as well but his wife thinks that hes not taking it as prescribed.  He was admitted to the hospital from June 4 to June 6 with atypical chest pain.  While he was in the hospital he had a modified barium swallow study done on 07/30/2014.  This recommended a continued dysphasia 3 diet, this time with thin liquids with strict precautions.  There was evidence of moderate pharyngeal phase dysphagia.  Pt admits that he isn't following the diet.  Pt states that about 3 weeks ago he fell backward and hit his head on a patio table.  Wife states that it didn't hit hard and no LOC.  No other falls.  He did have an injection in both shoulders recently and told that he  would be candidate for shoulder replacement.  His shoulders are still hurting him.  07/02/15 update:  The patient is following up today, accompanied by his wife who supplements the history.  I have not seen the patient since July, 2016.  At that point in time, his levodopa was increased from a total of 500 mg per day to 700 mg per day.  He was supposed to be on the carbidopa/levodopa 25/100, 3 tablets in the morning, 3 in the afternoon and one in the evening.  The patient states that he now takes 4 in the AM and doesn't take any after that as he thinks that it causes headache.  He admits that he d/c it for a while and had no headache.  He doesn't think that the medication helps walking but it did help his "trembling."  He admits to falls; one time got off golf cart and walking into store and fell in doorway.  Another time, fell out near golf cart and wife didn't find him for 1.5 hours.  Had a fall out of bed while sleeping.  Wife states that he is usually very still in the bed.  No yelling out in the night.  His wife does sleep in the same bed with him.  His wife called me a few weeks after I increased the dose to state that it was helpful.  He has refused physical therapy, primarily because of pain elsewhere.  His wife recently called to make a follow-up appointment stating that his anxiety and depression have increased.  He admits to being depressed.  His wife admits to being frustrated over the fact that he does not move.  Neuroimaging has previously been performed.  It is available for my review.  I reviewed pts MRI brain from 11/21/13.  There was no evidence of BG disease but there was very significant atrophy.    PREVIOUS MEDICATIONS: Sinemet and Requip  ALLERGIES:   Allergies  Allergen Reactions  . Caffeine Swelling    Joint swelling     CURRENT MEDICATIONS:  Outpatient Encounter Prescriptions as of 07/02/2015  Medication Sig  . amLODipine (NORVASC) 5 MG tablet Take one tablet by mouth once daily  to control blood pressure  . aspirin 81 MG tablet Take 81 mg by mouth every morning.   . benazepril (LOTENSIN) 5 MG tablet Take one tablet by mouth once daily for blood pressure  . Blood Glucose Monitoring Suppl (FREESTYLE LITE) DEVI Use to check blood sugar once daily Dx E11.43  . Calcium Carbonate-Vitamin D (CALCIUM-CARB 600 + D) 600-125 MG-UNIT TABS Take 1 tablet by mouth 2 (two) times daily.   . carbidopa-levodopa (SINEMET IR) 25-100 MG tablet  Take 4 tablets in the morning and 3 in the evening to control Parkinsonism (Patient taking differently: Take 4 tablets in the morning)  . cyanocobalamin (,VITAMIN B-12,) 1000 MCG/ML injection 1 mL into muscle every 30 days. DX: b12 deficiency  . fluorometholone (FML) 0.1 % ophthalmic suspension   . gabapentin (NEURONTIN) 100 MG capsule Take 3 at supper to prevent restless legs  . glipiZIDE (GLUCOTROL XL) 5 MG 24 hr tablet Take one tablet by mouth once daily to control blood sugar  . glucose blood (FREESTYLE LITE) test strip Use to check blood sugar once daily. Dx E11.43  . Lancets (FREESTYLE) lancets Use to check blood sugar once daily Dx E11.43  . metFORMIN (GLUCOPHAGE) 1000 MG tablet Take one tablet by mouth twice daily to control blood sugar  . mupirocin ointment (BACTROBAN) 2 % Apply 1 application topically daily. Applies to lesion on scrotum  . pantoprazole (PROTONIX) 40 MG tablet Take one tablet up to twice daily to suppress acid secretion  . pravastatin (PRAVACHOL) 20 MG tablet Take one tablet by mouth once daily to control cholesterol  . Syringe/Needle, Disp, (SYRINGE 3CC/25GX1") 25G X 1" 3 ML MISC Use as directed with Cyanocobalmin  . tamsulosin (FLOMAX) 0.4 MG CAPS capsule Take one tablet by mouth once daily at bedtime to prevent urinary frequency   No facility-administered encounter medications on file as of 07/02/2015.    PAST MEDICAL HISTORY:   Past Medical History  Diagnosis Date  . Type 2 diabetes mellitus with autonomic neuropathy  (HCC)   . Hyperlipidemia   . Hypertension   . Arthritis   . Diverticulitis   . Parkinsonism (HCC)   . Right knee pain   . Memory loss   . Depression   . Gait disturbance   . Restless leg   . Thyroid disease   . Vitamin D deficiency   . Osteoarthritis   . Osteoporosis   . Dysphagia, idiopathic     History of aspiration  . Recurrent falls     Using a cane and walker  . Dizzy   . Hearing loss of both ears   . Neck pain   . Thoracic back pain   . Lumbar back pain   . Shoulder pain   . Cyanocobalamin deficiency   . Other and unspecified hyperlipidemia   . Unspecified essential hypertension   . Abnormality of gait   . Unspecified hypothyroidism   . Restless legs syndrome (RLS)   . Bifascicular block     PAST SURGICAL HISTORY:   Past Surgical History  Procedure Laterality Date  . Cataract extraction Bilateral 2012    Dr. Hazle Quant    SOCIAL HISTORY:   Social History   Social History  . Marital Status: Married    Spouse Name: Kathie Rhodes  . Number of Children: 3  . Years of Education: College   Occupational History  . Retired     Engineer, petroleum [employer]   Social History Main Topics  . Smoking status: Former Smoker    Quit date: 01/31/1974  . Smokeless tobacco: Never Used  . Alcohol Use: No  . Drug Use: No  . Sexual Activity: No   Other Topics Concern  . Not on file   Social History Narrative   Married 1956. Returned from a Production designer, theatre/television/film for [employer].   Patient lives at home with his family.   Caffeine Use: none   Patient has a living will and healthcare power of attorney.    FAMILY HISTORY:   Family  Status  Relation Status Death Age  . Mother Deceased 62    stroke, MI  . Father Deceased 56    prostate cancer  . Sister Deceased 16    diabetic, cirrhosis  . Brother Deceased 10 months    crib death  . Brother Deceased 34    MI    ROS:  A complete 10 system review of systems was obtained and was unremarkable apart from what is mentioned above.  PHYSICAL  EXAMINATION:    VITALS:   Filed Vitals:   07/02/15 1439  BP: 134/68  Pulse: 92  Height: 6' (1.829 m)  Weight: 192 lb (87.091 kg)   Wt Readings from Last 3 Encounters:  07/02/15 192 lb (87.091 kg)  04/17/15 188 lb 3.2 oz (85.367 kg)  10/17/14 186 lb 12.8 oz (84.732 kg)     GEN:  The patient appears stated age and is in NAD.  Some pathologic laughter. HEENT:  Normocephalic, atraumatic.  The mucous membranes are moist. The superficial temporal arteries are without ropiness or tenderness. CV:  RRR.   Lungs:  CTAB Neck/HEME:  There are no carotid bruits bilaterally.  There is neck flexion.    Neurological examination:  Orientation: The patient is alert and oriented x3. Cranial nerves: There is good facial symmetry. There is facial hypomimia.   There are square wave jerks.  The visual fields are full to confrontational testing. The speech is fluent and clear but hypophonic. Soft palate rises symmetrically and there is no tongue deviation. Hearing is intact to conversational tone. Sensation: Sensation is intact to light touch throughout.   Movement examination: Tone: There is normal tone in the RUE.  There is normal tone in the LUE and bilateral lower extremities.   Abnormal movements: none Coordination:  There is slowness with alternation of supination/pronation of the forearm bilaterally, but not necessarily decremation of other forms of rapid alternating movements. Gait and Station: The patient has  difficulty arising out of a deep-seated chair without the use of the hands and has to push off.    He has a very stooped posture.  He drags the L leg and has mild start hesitation.    ASSESSMENT/PLAN:  1.  Multiple system atrophy  -Minimal response to levodopa on our UPDRS motor on/off test, but patient and his wife initially reported marked response to levodopa, even though I did not see that in the office.  He now reports that it is no longer helping.  I told him to stop the  levodopa if he does not feel that it is helping.  He is taking it only in the morning anyway.  He thinks it causes headache, which would be a strange side effect.  -Pt needs physical therapy.  Will refer 2.  Dysphagia.  -he had a modified barium swallow study done on 07/30/2014.  This recommended a continued dysphasia 3 diet, this time with thin liquids with strict precautions.  He is not doing that and understands the risks of morbidity and mortality. 3.  Sialorrhea.  -Talked about the value of Myobloc.  He will let me know if and when he would like to proceed. 4.  Anxiety, depression and some difficulty getting to sleep  -We will add Remeron.   Risks, benefits, side effects and alternative therapies were discussed.  The opportunity to ask questions was given and they were answered to the best of my ability.  The patient expressed understanding and willingness to follow the outlined treatment protocols. 5.  Follow up is anticipated in the next few months, sooner should new neurologic issues arise.

## 2015-07-18 ENCOUNTER — Ambulatory Visit: Payer: PPO | Attending: Neurology | Admitting: Physical Therapy

## 2015-07-18 DIAGNOSIS — R293 Abnormal posture: Secondary | ICD-10-CM

## 2015-07-18 DIAGNOSIS — R2681 Unsteadiness on feet: Secondary | ICD-10-CM | POA: Insufficient documentation

## 2015-07-18 DIAGNOSIS — R2689 Other abnormalities of gait and mobility: Secondary | ICD-10-CM | POA: Insufficient documentation

## 2015-07-18 DIAGNOSIS — M6281 Muscle weakness (generalized): Secondary | ICD-10-CM | POA: Diagnosis present

## 2015-07-19 ENCOUNTER — Telehealth: Payer: Self-pay | Admitting: Neurology

## 2015-07-19 NOTE — Therapy (Signed)
[facility] [address] [place], [place], [address] Phone: [phone]   Fax:  [phone]  Physical Therapy Evaluation  Patient Details  Name: Nicholas Barnett MRN: [id number] Date of Birth: [birth year] Referring Provider: Tat  Encounter Date: 07/18/2015      PT End of Session - 07/19/15 0825    Visit Number 1   Number of Visits 9   Date for PT Re-Evaluation 09/16/15   Authorization Type [employer] GCODE   PT Start Time 0932   PT Stop Time 1020   PT Time Calculation (min) 48 min   Equipment Utilized During Treatment Gait belt   Activity Tolerance Patient tolerated treatment well   Behavior During Therapy [facility] for tasks assessed/performed  Pt decides at end of therapy not to schedule further visits      Past Medical History  Diagnosis Date  . Type 2 diabetes mellitus with autonomic neuropathy (HCC)   . Hyperlipidemia   . Hypertension   . Arthritis   . Diverticulitis   . Parkinsonism (HCC)   . Right knee pain   . Memory loss   . Depression   . Gait disturbance   . Restless leg   . Thyroid disease   . Vitamin D deficiency   . Osteoarthritis   . Osteoporosis   . Dysphagia, idiopathic     History of aspiration  . Recurrent falls     Using a cane and walker  . Dizzy   . Hearing loss of both ears   . Neck pain   . Thoracic back pain   . Lumbar back pain   . Shoulder pain   . Cyanocobalamin deficiency   . Other and unspecified hyperlipidemia   . Unspecified essential hypertension   . Abnormality of gait   . Unspecified hypothyroidism   . Restless legs syndrome (RLS)   . Bifascicular block     Past Surgical History  Procedure Laterality Date  . Cataract extraction Bilateral 2012    Dr. Hazle Quant    There were no vitals filed for this visit.       Subjective Assessment - 07/18/15 0939    Subjective Pt is an 82 year old male who reports he is here for therapy to get a new motorized cart  for mobility.  (Wife concerned that this isn't the true reason why patient is here today-more to improve general mobility and prevent falls). Pt reports R knee is bad and he has had several falls. Pt presents to PT today using crutches.  Pt has had at least 3 falls in the past 6 months.   Patient is accompained by: Family member  wife   Pertinent History Wife concerned that patient has stopped taking Sinemet medication on his own.  She feels his gait has worsened and he is having personality changes recently.   Patient Stated Goals Pt's goal for therapy is to improve getting out of bed and be able to move in bed.   Currently in Pain? Yes   Pain Score 10-Worst pain ever  no pain at rest   Pain Location Shoulder   Pain Orientation Right;Left   Pain Descriptors / Indicators Aching   Pain Type Chronic pain   Pain Onset More than a month ago   Pain Frequency Intermittent   Aggravating Factors  when moving, walking   Pain Relieving Factors sitting and resting   Multiple Pain Sites Yes   Pain Score 10  no pain at rest  Pain Location Knee   Pain Orientation Right   Pain Type Chronic pain   Pain Onset More than a month ago   Pain Frequency Intermittent   Aggravating Factors  walking and turning   Pain Relieving Factors sitting and resting            [facility] PT Assessment - 07/18/15 0948    Assessment   Medical Diagnosis Parkinsonism   Referring Provider Tat   Precautions   Precautions Fall   Balance Screen   Has the patient fallen in the past 6 months Yes   How many times? 3   Has the patient had a decrease in activity level because of a fear of falling?  Yes   Is the patient reluctant to leave their home because of a fear of falling?  Yes   Home Environment   Living Environment Private residence   Living Arrangements Spouse/significant other   Available Help at Discharge Family   Type of Home House   Home Access Stairs to enter   Entrance Stairs-Number of Steps 2   Entrance  Stairs-Rails None  Holds to storm door going in   Home Layout Two level;Able to live on main level with bedroom/bathroom   Home Equipment Walker - 4 wheels;Crutches;Cane - single point  Walker aggravates bursitis in shoulders   Prior Function   Level of Independence Independent with household mobility with device;Independent with community mobility with device  Uses golf cart    Leisure Enjoys wood working; has a Marketing executive   Posture/Postural Control Postural limitations   Postural Limitations Rounded Shoulders;Forward head;Posterior pelvic tilt   ROM / Strength   AROM / PROM / Strength Strength   Strength   Strength Assessment Site Hip;Knee;Ankle   Right/Left Hip Right;Left   Right Hip Flexion 3-/5   Left Hip Flexion 4/5   Right/Left Knee Right;Left   Right Knee Flexion 3-/5   Right Knee Extension 3-/5   Left Knee Flexion 3+/5   Left Knee Extension 4/5   Right/Left Ankle Right;Left   Right Ankle Dorsiflexion 3+/5   Left Ankle Dorsiflexion 4/5   Bed Mobility   Bed Mobility --  Pt reports difficulty with be mobility-rolling and scooting   Transfers   Transfers Sit to Stand;Stand to Sit   Sit to Stand 5: Supervision;With upper extremity assist;With armrests;From chair/3-in-1  Extra time required   Stand to Sit 5: Supervision;With upper extremity assist;With armrests;To chair/3-in-1  Doesn't fully turn and square up to chair before sitting   Ambulation/Gait   Ambulation/Gait Yes   Ambulation/Gait Assistance 5: Supervision;4: Min guard   Ambulation/Gait Assistance Details Pt uses crutches, as he feels walker in general hurts his shoulders.  With crutches, pt has increased forward lean, festinating/then hastening gait, with difficulty sequencing crutches.  With trial of rollator walker, pt keeps rollator too far in front of BOS, and pt has increased festinating/freezing episodes coming up onto toes.   Ambulation Distance (Feet) 80 Feet  then 50 ft with  rollator   Assistive device Crutches;Rollator   Gait Pattern Wide base of support;Trunk flexed;Step-through pattern;Step-to pattern;Decreased step length - right;Decreased step length - left;Shuffle;Festinating;Poor foot clearance - left;Poor foot clearance - right  Hastening/freezing episodes   Ambulation Surface Level;Indoor   Gait velocity 15.46 sec = 2.12 ft/sec with crutches; 19.16 sec = 1.71 ft/sec   Gait Comments While patient has difficulty sequencing gait with crutches and has hastening of gait, pt has increased festinating and freezing (up on  toes) episodes with rollator walker.  Discussed with patient and wife that gait trials and training with an appropriate device will be needed for optimal safety with gait.                                PT Long Term Goals - 07/19/15 4098    PT LONG TERM GOAL #1   Title Pt will verbalize understanding and agreement of fall prevention strategies to reduce falls risk. TARGET 08/17/15   Time 4   Status New   PT LONG TERM GOAL #2   Title Pt will report at least 25% improvement in bed mobility, for improved functional mobility.   Time 4   Period Weeks   PT LONG TERM GOAL #3   Title Pt will perform at least 6 of 10 reps of sit<>stand transfers with minimal to no UE support for improved transfer efficiency and safety.   Time 4   Period Weeks   Status New   PT LONG TERM GOAL #5   Title Pt will ambulate with appropriate assistive device at least 2 f/tsec for improved gait efficiency and safety.   Time 4   Period Weeks   Status New               Plan - 07/19/15 0826    Clinical Impression Statement Pt is an 82 year old male who presents to OP PT with diagnosis of Parkinsonism, with wife reporting recently stopping Sinemet medications.  Patient presents with decreased safety awareness, decreased timing and coordination of gait, decreased balance, decreased strength, decreased independence with transfers and bed  mobility.  Pt would benefit from skilled physical therapy to address the above stated deficits to improve funcitonal mobility and to decrease fall risk.  Pt feels he cannot make 2x/wk frequency, does not schedule further appts   Rehab Potential Fair  Co-morbidities, motivation for therapy   PT Frequency 2x / week   PT Duration 4 weeks   PT Treatment/Interventions ADLs/Self Care Home Management;Therapeutic exercise;Therapeutic activities;Functional mobility training;Gait training;DME Instruction;Patient/family education;Neuromuscular re-education;Balance training   PT Next Visit Plan If patient returns for therapy:  gait trials with RW and with U-step RW; safety education, fall prevention, bed mobiltiy training   Consulted and Agree with Plan of Care Patient;Family member/caregiver   Family Member Consulted wife      Patient will benefit from skilled therapeutic intervention in order to improve the following deficits and impairments:  Abnormal gait, Decreased activity tolerance, Decreased balance, Decreased mobility, Decreased knowledge of use of DME, Decreased safety awareness, Decreased strength, Difficulty walking, Postural dysfunction  Visit Diagnosis: Other abnormalities of gait and mobility  Unsteadiness on feet  Muscle weakness (generalized)  Abnormal posture      G-Codes - 20-Jul-2015 0835    Functional Assessment Tool Used gait velocity with crutches 2.12 ft/sec, with rollator 1.71 ft/sec; 3 falls in past 6 months   Functional Limitation Mobility: Walking and moving around   Mobility: Walking and Moving Around Current Status [phone]) At least 40 percent but less than 60 percent impaired, limited or restricted   Mobility: Walking and Moving Around Goal Status [phone]) At least 20 percent but less than 40 percent impaired, limited or restricted       Problem List Patient Active Problem List   Diagnosis Date Noted  . GERD (gastroesophageal reflux disease) 04/17/2015  . UTI  (urinary tract infection) 10/17/2014  . Pain in the  chest   . Chest pain 07/28/2014  . Abnormal EKG 07/28/2014  . Insomnia 10/17/2013  . Pain in joint, shoulder region 01/25/2013  . Dysphagia, idiopathic   . Recurrent falls   . Dizzy   . Hearing loss of both ears   . Neck pain   . Right knee pain   . Thoracic back pain   . Lumbar back pain   . Shoulder pain   . Cyanocobalamin deficiency   . Type 2 diabetes mellitus with autonomic neuropathy (HCC)   . Restless legs syndrome (RLS)   . Parkinson's plus syndrome (HCC) 11/24/2012  . Gait difficulty 11/24/2012  . NOCTURIA 05/08/2009  . SOMNOLENCE 04/11/2009  . Memory loss 04/11/2009  . ECZEMA 03/21/2009  . BPH (benign prostatic hyperplasia) 12/25/2008  . OSTEOARTHRITIS, KNEE, RIGHT 12/25/2008  . OSTEOPOROSIS 12/25/2008  . URINARY URGENCY 12/25/2008  . VITAMIN D DEFICIENCY 12/22/2007  . DIVERTICULITIS, HX OF 06/01/2007  . Hypothyroidism 12/16/2006  . Hyperlipemia 12/16/2006  . ERECTILE DYSFUNCTION 12/16/2006  . Essential hypertension 12/16/2006    Lucy Woolever W. 07/19/2015, 8:37 AM  Gean MaidensMARRIOTT,Kamy Poinsett W., PT  [facility] [address] [place], [place], [address] Phone: [phone]   Fax:  [phone]  Name: Nicholas Barnett MRN: [id number] Date of Birth: [birth year]

## 2015-07-19 NOTE — Telephone Encounter (Signed)
Left message on machine for patient or wife to call back.

## 2015-07-19 NOTE — Telephone Encounter (Signed)
Spoke with patient's wife. She states patient has refused to go back to PT, has refused to take him Levodopa, has refused the idea of seeing psychiatry. Patient's wife states power wheelchair would not be helpful in their house because it has lots of step downs and small doorways so he would not be able to move it around in the house. She states he is on remeron but personality changes appeared after he stopped levodopa. She will try to encourage patient to restart medication and go back to therapy. She will let us know if there is anything else we can do to help.

## 2015-07-19 NOTE — Telephone Encounter (Signed)
Received following from PT:         I wanted to let you know I evaluated Nicholas Barnett yesterday. He stated initially that he was here to be evaluated for power mobility. (I did not proceed with that, as I didn't see that stated in your notes or the order). I completed the evaluation, but at the end, he was adamant that he wasn't going to schedule any additional appointments. He is unsafe with the crutches that he uses and the rollator actually worsens his freezing. The plan was to continue to assess what device may be best for him, but he may not return to therapy.   I wanted to make you aware that his wife is concerned because he has stopped taking his Sinemet and she feels his gait has worsened and she notes personality changes.   If you feel that power mobility is appropriate, we will need a specific order for that and have to schedule a vendor to come out for that assessment.   Let me know if you have any other questions.    Jade, please call pt/wife.  It is the patient who wanted off of the levodopa as he didn't think it helped (and he may be right).  If walking worsened, need to resume but should take tid as directed.  As for power wheelchair, I don't remember us talking about that but we may have.  Would probably need to make sure that cognitively he could control it.  As for personality changes, did he start remeron?  Did that make him worse?  May need psychiatry

## 2015-09-12 ENCOUNTER — Encounter: Payer: Self-pay | Admitting: Internal Medicine

## 2015-10-01 NOTE — Progress Notes (Signed)
Nicholas Barnett was seen today in the movement disorders clinic for neurologic consultation at the request of Murray Hodgkins, MD.  The consultation is for the evaluation of Parkinsonism.  The patient has previously seen Dr. Sandria Manly and Dr. Marjory Lies.  Records were reviewed from both of those physicians.  The patient's symptoms started in 2010.  First symptoms consisted of dizziness and falls.  He began to see Dr. Sandria Manly around the time that symptoms began.  He first had an MRI of the brain in February 2011 that demonstrated rather significant atrophy.  B12 at that time was found to be low at 181.  He had a swallow study in June, 2011 that demonstrated moderate dysphagia with intermittent aspiration of thin liquids.  He was tried on levodopa (I do not know what dose and they don't even remember the medication or how long he was on it) and that did not seem to help.  He was later tried on ropinirole which also did not help.  He was tried on, and is still on, gabapentin for restless leg syndrome.  His most recent MRI of the brain was on 11/21/2013 and I reviewed this as well as his previous MRI of the brain.  There remains very significant atrophy, although there is not significant basal ganglia disease.  There is mild to moderate small vessel disease.  02/13/14 update:  Pt was seen today for UPDRS motor on/off testing.  He is accompanied by his family who supplements the hx.  Pt had MBE on 02/05/14 and demonstrated mild oral and mod pharyngeal phase dysphagia and mechanical soft with honey thick liquid was recommended.  He is still awaiting ST to contact him (out pt).  05/15/14 update:  The patient is accompanied by his family supplements the history.  Patient has a history of multiple system atrophy.  He has been minimally responsive to levodopa via UPDRS on/off last visit but they wanted to retry the medication at last visit and he was told to take 2 tablets at 11 AM (wakeup time) 2 at 3 PM and 1 at 7 PM.  Pt  states that last visit he walked in here with a walker and left without it after the on/off test.  Has felt much better ever since.  He was doing well in PT but pt states that the last time he went he got put on a machine that hurt his shoulder.  His wife does state that the levodopa has helped him remarkably.  He is now able to do much of his own dressing and bathing and he was not able to do that previously.  No falls since our last visit.  Pt states that he could not deal with the thickened food and understands the risks of not following the recommended diet.  He is, however, eating better and has a much better appetite.  He has gained some weight.  He did not get speech therapy; wife states that they were to call back to set this up but didn't.    09/18/14 update:  The patient presents today, accompanied by his wife who supplements the history.  The patients records were reviewed since last visit.  Despite the fact that the patient's UPDRS motor on/off test did not show much benefit with levodopa, the patient and his wife think that the levodopa has been very beneficial at home and helped freezing.  Therefore, he has been on carbidopa/levodopa 25/100, 2 tablets in the morning, 2 in the afternoon and  one towards evening.  He doesn't think that's its working as well but his wife thinks that hes not taking it as prescribed.  He was admitted to the hospital from June 4 to June 6 with atypical chest pain.  While he was in the hospital he had a modified barium swallow study done on 07/30/2014.  This recommended a continued dysphasia 3 diet, this time with thin liquids with strict precautions.  There was evidence of moderate pharyngeal phase dysphagia.  Pt admits that he isn't following the diet.  Pt states that about 3 weeks ago he fell backward and hit his head on a patio table.  Wife states that it didn't hit hard and no LOC.  No other falls.  He did have an injection in both shoulders recently and told that he  would be candidate for shoulder replacement.  His shoulders are still hurting him.  07/02/15 update:  The patient is following up today, accompanied by his wife who supplements the history.  I have not seen the patient since July, 2016.  At that point in time, his levodopa was increased from a total of 500 mg per day to 700 mg per day.  He was supposed to be on the carbidopa/levodopa 25/100, 3 tablets in the morning, 3 in the afternoon and one in the evening.  The patient states that he now takes 4 in the AM and doesn't take any after that as he thinks that it causes headache.  He admits that he d/c it for a while and had no headache.  He doesn't think that the medication helps walking but it did help his "trembling."  He admits to falls; one time got off golf cart and walking into store and fell in doorway.  Another time, fell out near golf cart and wife didn't find him for 1.5 hours.  Had a fall out of bed while sleeping.  Wife states that he is usually very still in the bed.  No yelling out in the night.  His wife does sleep in the same bed with him.  His wife called me a few weeks after I increased the dose to state that it was helpful.  He has refused physical therapy, primarily because of pain elsewhere.  His wife recently called to make a follow-up appointment stating that his anxiety and depression have increased.  He admits to being depressed.  His wife admits to being frustrated over the fact that he does not move.  10/03/15 update:  The patient follows up today, accompanied by his wife who supplements the history.  I have reviewed records available to me from other providers as well.  Last visit, the patient was only taking levodopa in the morning and he did not think it was helpful, so I told him he could discontinue it.  I also referred him to physical therapy, but he only went one time and thought it was for a wheelchair evaluation, and refused to go back.  His wife called me after the discontinuation  of levodopa and stated that he had gotten worse.  I said that he could go back on it, but the patient refused but then ended up going back on 3 po bid.  She had also noticed personality changes after he went off of it, but he refused psychiatric therapy.  She did not think that the personality changes were related to the addition of Remeron because the timing did not seem to correlate with that.  Ran out of  that 2 days ago.  Wife not sure that "it was strong enough."  Asks about whether or not dose should be increased.  Comes in with crutches today and states that he can walk with those but not with walker or cane.  No falls since our last visit.  Denies swallowing issues unless he tries to take all of his pills at once, including his large calcium pill.  Neuroimaging has previously been performed.  It is available for my review.  I reviewed pts MRI brain from 11/21/13.  There was no evidence of BG disease but there was very significant atrophy.    PREVIOUS MEDICATIONS: Sinemet and Requip  ALLERGIES:   Allergies  Allergen Reactions  . Caffeine Swelling    Joint swelling     CURRENT MEDICATIONS:  Outpatient Encounter Prescriptions as of 10/03/2015  Medication Sig  . amLODipine (NORVASC) 5 MG tablet Take one tablet by mouth once daily to control blood pressure  . aspirin 81 MG tablet Take 81 mg by mouth every morning.   . benazepril (LOTENSIN) 5 MG tablet Take one tablet by mouth once daily for blood pressure  . Blood Glucose Monitoring Suppl (FREESTYLE LITE) DEVI Use to check blood sugar once daily Dx E11.43  . Calcium Carbonate-Vitamin D (CALCIUM-CARB 600 + D) 600-125 MG-UNIT TABS Take 1 tablet by mouth 2 (two) times daily.   . carbidopa-levodopa (SINEMET IR) 25-100 MG tablet Take 4 tablets in the morning and 3 in the evening to control Parkinsonism (Patient taking differently: Take 3 tablets by mouth 2 (two) times daily. Take 4 tablets in the morning and 3 in the evening to control Parkinsonism)    . cyanocobalamin (,VITAMIN B-12,) 1000 MCG/ML injection 1 mL into muscle every 30 days. DX: b12 deficiency  . fluorometholone (FML) 0.1 % ophthalmic suspension   . gabapentin (NEURONTIN) 100 MG capsule Take 3 at supper to prevent restless legs  . glipiZIDE (GLUCOTROL XL) 5 MG 24 hr tablet Take one tablet by mouth once daily to control blood sugar  . glucose blood (FREESTYLE LITE) test strip Use to check blood sugar once daily. Dx E11.43  . Lancets (FREESTYLE) lancets Use to check blood sugar once daily Dx E11.43  . metFORMIN (GLUCOPHAGE) 1000 MG tablet Take one tablet by mouth twice daily to control blood sugar  . mirtazapine (REMERON) 15 MG tablet Take 1 tablet (15 mg total) by mouth at bedtime.  . mupirocin ointment (BACTROBAN) 2 % Apply 1 application topically daily. Applies to lesion on scrotum  . pantoprazole (PROTONIX) 40 MG tablet Take one tablet up to twice daily to suppress acid secretion  . pravastatin (PRAVACHOL) 20 MG tablet Take one tablet by mouth once daily to control cholesterol  . Syringe/Needle, Disp, (SYRINGE 3CC/25GX1") 25G X 1" 3 ML MISC Use as directed with Cyanocobalmin  . tamsulosin (FLOMAX) 0.4 MG CAPS capsule Take one tablet by mouth once daily at bedtime to prevent urinary frequency   No facility-administered encounter medications on file as of 10/03/2015.     PAST MEDICAL HISTORY:   Past Medical History:  Diagnosis Date  . Abnormality of gait   . Arthritis   . Bifascicular block   . Cyanocobalamin deficiency   . Depression   . Diverticulitis   . Dizzy   . Dysphagia, idiopathic    History of aspiration  . Gait disturbance   . Hearing loss of both ears   . Hyperlipidemia   . Hypertension   . Lumbar back pain   .  Memory loss   . Neck pain   . Osteoarthritis   . Osteoporosis   . Other and unspecified hyperlipidemia   . Parkinsonism (HCC)   . Recurrent falls    Using a cane and walker  . Restless leg   . Restless legs syndrome (RLS)   . Right knee  pain   . Shoulder pain   . Thoracic back pain   . Thyroid disease   . Type 2 diabetes mellitus with autonomic neuropathy (HCC)   . Unspecified essential hypertension   . Unspecified hypothyroidism   . Vitamin D deficiency     PAST SURGICAL HISTORY:   Past Surgical History:  Procedure Laterality Date  . CATARACT EXTRACTION Bilateral 2012   Dr. Hazle Quant    SOCIAL HISTORY:   Social History   Social History  . Marital status: Married    Spouse name: Kathie Rhodes  . Number of children: 3  . Years of education: College   Occupational History  . Retired Retired    Engineer, petroleum [employer]   Social History Main Topics  . Smoking status: Former Smoker    Quit date: 01/31/1974  . Smokeless tobacco: Never Used  . Alcohol use No  . Drug use: No  . Sexual activity: No   Other Topics Concern  . Not on file   Social History Narrative   Married 1956. Returned from a Production designer, theatre/television/film for [employer].   Patient lives at home with his family.   Caffeine Use: none   Patient has a living will and healthcare power of attorney.    FAMILY HISTORY:   Family Status  Relation Status  . Mother Deceased at age 71   stroke, MI  . Father Deceased at age 56   prostate cancer  . Sister Deceased at age 42   diabetic, cirrhosis  . Brother Deceased at age 19 months   crib death  . Brother Deceased at age 82   MI    ROS:  A complete 10 system review of systems was obtained and was unremarkable apart from what is mentioned above.  PHYSICAL EXAMINATION:    VITALS:   Vitals:   10/03/15 1234  BP: 130/70  Pulse: 80  Weight: 191 lb (86.6 kg)  Height: 6' (1.829 m)   Wt Readings from Last 3 Encounters:  10/03/15 191 lb (86.6 kg)  07/02/15 192 lb (87.1 kg)  04/17/15 188 lb 3.2 oz (85.4 kg)     GEN:  The patient appears stated age and is in NAD.  Some pathologic laughter. HEENT:  Normocephalic, atraumatic.  The mucous membranes are moist. The superficial temporal arteries are without ropiness or  tenderness. CV:  RRR.   Lungs:  CTAB Neck/HEME:  There are no carotid bruits bilaterally.  There is neck flexion.    Neurological examination:  Orientation: The patient is alert and oriented x3. Cranial nerves: There is good facial symmetry. There is facial hypomimia.   There are square wave jerks.  The visual fields are full to confrontational testing. The speech is fluent and clear but hypophonic. Soft palate rises symmetrically and there is no tongue deviation. Hearing is intact to conversational tone. Sensation: Sensation is intact to light touch throughout.   Movement examination: Tone: There is normal tone in the RUE.  There is normal tone in the LUE and bilateral lower extremities.   Abnormal movements: none Coordination:  There is decreased RAM's on the R with slow toe taps on the right Gait and Station: The patient  has  difficulty arising out of a deep-seated chair without the use of the hands and has to push off.    He uses crutches in a strange fashion that is unbalanced.  He drags the R leg and freezes in the door and turns en bloc.    ASSESSMENT/PLAN:  1.  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  When he stopped it, his wife thought he got much worse and she made him go back on it but he will only take it bid and he takes 3 po bid.  Wife thinks that he got better back on it.  She also thought that he was behaviorally worse off of ti.    -Needs physical therapy but refuses.  States that he went to one session last time and got worse and refuses to go back.    -talked to him about stopping using the crutches and to try instead ski poles or large walking sticks.  He states that the walker makes him worse.    2.  Dysphagia.  -he had a modified barium swallow study done on 07/30/2014.  This recommended a continued dysphasia 3 diet, this time with thin liquids with strict precautions.  He is not doing that and understands the risks of morbidity and mortality.  He denies that he has this issue 3.  Sialorrhea.  -Talked about the value of Myobloc.  He will let me know if and when he would like to proceed. 4.  Anxiety, depression and some difficulty getting to sleep  -increase remeron to 30 mg.  If no help will try something else.   Risks, benefits, side effects and alternative therapies were discussed.  The opportunity to ask questions was given and they were answered to the best of my ability.  The patient expressed understanding and willingness to follow the outlined treatment protocols. 5.  Follow up is anticipated in the next few months, sooner should new neurologic issues arise.  Much greater than 50% of this visit was spent in counseling and coordinating care.  Total face to face time:  25 min

## 2015-10-03 ENCOUNTER — Encounter: Payer: Self-pay | Admitting: Neurology

## 2015-10-03 ENCOUNTER — Ambulatory Visit (INDEPENDENT_AMBULATORY_CARE_PROVIDER_SITE_OTHER): Payer: PPO | Admitting: Neurology

## 2015-10-03 VITALS — BP 130/70 | HR 80 | Ht 72.0 in | Wt 191.0 lb

## 2015-10-03 DIAGNOSIS — F331 Major depressive disorder, recurrent, moderate: Secondary | ICD-10-CM | POA: Diagnosis not present

## 2015-10-03 DIAGNOSIS — G214 Vascular parkinsonism: Secondary | ICD-10-CM

## 2015-10-03 MED ORDER — MIRTAZAPINE 30 MG PO TABS: 30.0000 mg | ORAL_TABLET | Freq: Every day | ORAL | 1 refills | Status: DC

## 2015-10-03 NOTE — Patient Instructions (Signed)
1.  Call me in 6-8 weeks if mirtazipine no better and we can change the medication

## 2015-10-07 ENCOUNTER — Telehealth: Payer: Self-pay | Admitting: Neurology

## 2015-10-07 DIAGNOSIS — G20 Parkinson's disease: Secondary | ICD-10-CM

## 2015-10-07 DIAGNOSIS — G214 Vascular parkinsonism: Secondary | ICD-10-CM

## 2015-10-07 NOTE — Telephone Encounter (Signed)
His # is [phone]. Nicholas Barn[facility] He said Dr. Arbutus Leasat told him to let her know if he was interested. Thank you

## 2015-10-07 NOTE — Telephone Encounter (Signed)
Patient and wife made aware will need a mobility eval. Order placed.

## 2015-10-11 NOTE — Addendum Note (Signed)
Addended by: Amneet Cendejas C on: 10/11/2015 03:06 PM   Modules accepted: Orders  

## 2015-10-14 ENCOUNTER — Other Ambulatory Visit: Payer: PPO

## 2015-10-14 DIAGNOSIS — E1143 Type 2 diabetes mellitus with diabetic autonomic (poly)neuropathy: Secondary | ICD-10-CM

## 2015-10-14 DIAGNOSIS — E785 Hyperlipidemia, unspecified: Secondary | ICD-10-CM

## 2015-10-14 DIAGNOSIS — I10 Essential (primary) hypertension: Secondary | ICD-10-CM

## 2015-10-14 DIAGNOSIS — E039 Hypothyroidism, unspecified: Secondary | ICD-10-CM

## 2015-10-14 LAB — LIPID PANEL
CHOL/HDL RATIO: 3.2 ratio (ref <=5.0)
Cholesterol: 153 mg/dL (ref 125–200)
HDL: 48 mg/dL (ref >=40)
LDL CALC: 79 mg/dL (ref <130)
Triglycerides: 131 mg/dL (ref <150)
VLDL: 26 mg/dL (ref <30)

## 2015-10-14 LAB — COMPREHENSIVE METABOLIC PANEL
ALT: 9 U/L (ref 9–46)
AST: 14 U/L (ref 10–35)
Albumin: 4.3 g/dL (ref 3.6–5.1)
Alkaline Phosphatase: 71 U/L (ref 40–115)
BUN: 21 mg/dL (ref 7–25)
CHLORIDE: 105 mmol/L (ref 98–110)
CO2: 26 mmol/L (ref 20–31)
CREATININE: 1.23 mg/dL — AB (ref 0.70–1.11)
Calcium: 9.5 mg/dL (ref 8.6–10.3)
Glucose, Bld: 115 mg/dL — ABNORMAL HIGH (ref 65–99)
Potassium: 3.7 mmol/L (ref 3.5–5.3)
SODIUM: 141 mmol/L (ref 135–146)
Total Bilirubin: 0.7 mg/dL (ref 0.2–1.2)
Total Protein: 6.8 g/dL (ref 6.1–8.1)

## 2015-10-14 LAB — TSH: TSH: 2.59 mIU/L (ref 0.40–4.50)

## 2015-10-15 LAB — HEMOGLOBIN A1C
HEMOGLOBIN A1C: 5.5 % (ref <5.7)
Mean Plasma Glucose: 111 mg/dL

## 2015-10-15 LAB — MICROALBUMIN, URINE: MICROALB UR: 3.8 mg/dL

## 2015-10-16 ENCOUNTER — Ambulatory Visit (INDEPENDENT_AMBULATORY_CARE_PROVIDER_SITE_OTHER): Payer: PPO | Admitting: Internal Medicine

## 2015-10-16 ENCOUNTER — Encounter: Payer: Self-pay | Admitting: Internal Medicine

## 2015-10-16 VITALS — BP 124/72 | HR 69 | Temp 97.7°F | Ht 72.0 in | Wt 191.0 lb

## 2015-10-16 DIAGNOSIS — R413 Other amnesia: Secondary | ICD-10-CM

## 2015-10-16 DIAGNOSIS — R509 Fever, unspecified: Secondary | ICD-10-CM | POA: Diagnosis not present

## 2015-10-16 DIAGNOSIS — G232 Striatonigral degeneration: Secondary | ICD-10-CM

## 2015-10-16 DIAGNOSIS — E039 Hypothyroidism, unspecified: Secondary | ICD-10-CM | POA: Diagnosis not present

## 2015-10-16 DIAGNOSIS — I10 Essential (primary) hypertension: Secondary | ICD-10-CM | POA: Diagnosis not present

## 2015-10-16 DIAGNOSIS — F332 Major depressive disorder, recurrent severe without psychotic features: Secondary | ICD-10-CM | POA: Diagnosis not present

## 2015-10-16 DIAGNOSIS — F329 Major depressive disorder, single episode, unspecified: Secondary | ICD-10-CM | POA: Insufficient documentation

## 2015-10-16 DIAGNOSIS — E1143 Type 2 diabetes mellitus with diabetic autonomic (poly)neuropathy: Secondary | ICD-10-CM | POA: Diagnosis not present

## 2015-10-16 DIAGNOSIS — G20C Parkinsonism, unspecified: Secondary | ICD-10-CM

## 2015-10-16 DIAGNOSIS — G2581 Restless legs syndrome: Secondary | ICD-10-CM | POA: Diagnosis not present

## 2015-10-16 NOTE — Progress Notes (Signed)
Facility  [facility]    Place of Service:   OFFICE    Allergies  Allergen Reactions  . Caffeine Swelling    Joint swelling     Chief Complaint  Patient presents with  . Medical Management of Chronic Issues    6 month medication management blood pressure, thyroid, blood sugar, Parkinson's. Here with wife  . Fever    last night, chills, felt warm to wife, she gave him two Tylenol. Better this morning.    HPI:   Essential hypertension - controlled  Hypothyroidism, unspecified hypothyroidism type - compensated  Parkinson's plus syndrome ([place]) - unchanged  Memory loss  Type 2 diabetes mellitus with autonomic neuropathy, unspecified long term insulin use status (HCC) - controlled  Restless legs syndrome (RLS) - improved  Fever, unspecified - back to normal  Asking about [employer] for shoulders and knees.  Depression - Patient was started on mirtazapine by Dr. Carles Collet. Is now at 30 mg daily. His wife says he is negative about "everything".  Medications: Patient's Medications  New Prescriptions   No medications on file  Previous Medications   AMLODIPINE (NORVASC) 5 MG TABLET    Take one tablet by mouth once daily to control blood pressure   ASPIRIN 81 MG TABLET    Take 81 mg by mouth every morning.    BENAZEPRIL (LOTENSIN) 5 MG TABLET    Take one tablet by mouth once daily for blood pressure   BLOOD GLUCOSE MONITORING SUPPL (FREESTYLE LITE) DEVI    Use to check blood sugar once daily Dx E11.43   CALCIUM CARBONATE-VITAMIN D (CALCIUM-CARB 600 + D) 600-125 MG-UNIT TABS    Take 1 tablet by mouth 2 (two) times daily.    CARBIDOPA-LEVODOPA (SINEMET IR) 25-100 MG TABLET    Take 1 tablet by mouth. Take 3 tablets twice daily to control Parkinsonism   CYANOCOBALAMIN (,VITAMIN B-12,) 1000 MCG/ML INJECTION    1 mL into muscle every 30 days. DX: b12 deficiency   FLUOROMETHOLONE (FML) 0.1 % OPHTHALMIC SUSPENSION       GABAPENTIN (NEURONTIN) 100 MG CAPSULE    Take 3 at supper to prevent  restless legs   GLIPIZIDE (GLUCOTROL XL) 5 MG 24 HR TABLET    Take one tablet by mouth once daily to control blood sugar   GLUCOSE BLOOD (FREESTYLE LITE) TEST STRIP    Use to check blood sugar once daily. Dx E11.43   LANCETS (FREESTYLE) LANCETS    Use to check blood sugar once daily Dx E11.43   METFORMIN (GLUCOPHAGE) 1000 MG TABLET    Take one tablet by mouth twice daily to control blood sugar   MIRTAZAPINE (REMERON) 30 MG TABLET    Take 1 tablet (30 mg total) by mouth at bedtime.   MUPIROCIN OINTMENT (BACTROBAN) 2 %    Apply 1 application topically daily. Applies to lesion on scrotum   PANTOPRAZOLE (PROTONIX) 40 MG TABLET    Take one tablet up to twice daily to suppress acid secretion   PRAVASTATIN (PRAVACHOL) 20 MG TABLET    Take one tablet by mouth once daily to control cholesterol   SYRINGE/NEEDLE, DISP, (SYRINGE 3CC/25GX1") 25G X 1" 3 ML MISC    Use as directed with Cyanocobalmin   TAMSULOSIN (FLOMAX) 0.4 MG CAPS CAPSULE    Take one tablet by mouth once daily at bedtime to prevent urinary frequency  Modified Medications   No medications on file  Discontinued Medications   CARBIDOPA-LEVODOPA (SINEMET IR) 25-100 MG TABLET  Take 4 tablets in the morning and 3 in the evening to control Parkinsonism    Review of Systems  Constitutional: Positive for appetite change (Loss of appetite) and fatigue.       Regaining weight  HENT: Positive for hearing loss (Wearing hearing aids) and voice change (Voice is weaker.). Negative for ear pain.        Edentulous  Eyes: Negative.   Respiratory: Positive for cough (Related to recurrent aspiration) and shortness of breath.   Cardiovascular: Negative for chest pain, palpitations and leg swelling.  Gastrointestinal: Positive for constipation.       History of diverticulitis. Mild dysphagia. Burns when he lays down at night.  Endocrine:       Diabetic.  Genitourinary:       Weak stream. Occasional urinary incontinence.  Musculoskeletal: Positive for  arthralgias, back pain, gait problem, myalgias, neck pain and neck stiffness.       Shoulder pains. Nothing seems to help.  Skin: Negative for pallor, rash and wound.  Allergic/Immunologic: Negative.   Neurological: Positive for dizziness, tremors and weakness (Generalized).       Memory loss. Confusion.  Hematological:       [place]  Psychiatric/Behavioral: Positive for agitation, dysphoric mood and sleep disturbance. The patient is nervous/anxious.     Vitals:   10/16/15 1109  BP: 124/72  Pulse: 69  Temp: 97.7 F (36.5 C)  TempSrc: Oral  SpO2: 97%  Weight: 191 lb (86.6 kg)  Height: 6' (1.829 m)   Body mass index is 25.9 kg/m. Wt Readings from Last 3 Encounters:  10/16/15 191 lb (86.6 kg)  10/03/15 191 lb (86.6 kg)  07/02/15 192 lb (87.1 kg)      Physical Exam  Constitutional: He is oriented to person, place, and time.  Thin. Fragile. Elderly.  HENT:  Head: Normocephalic and atraumatic.  Right Ear: External ear normal.  Left Ear: External ear normal.  Nose: Nose normal.  Mouth/Throat: Oropharynx is clear and moist.  Loss of hearing. Hearing aids. Edentulous.  Eyes: Conjunctivae and EOM are normal. Pupils are equal, round, and reactive to light.  Neck: No JVD present. No tracheal deviation present. No thyromegaly present.  Cardiovascular: Normal rate, regular rhythm, normal heart sounds and intact distal pulses.  Exam reveals no gallop and no friction rub.   No murmur heard. Pulmonary/Chest: Breath sounds normal. No respiratory distress. He has no wheezes. He has no rales.  Abdominal: He exhibits no distension and no mass. There is no tenderness.  Musculoskeletal: He exhibits no edema or tenderness.  Tender in both shoulders. Mild restriction of movement of the shoulders. Unstable gait. Right knee pains inhibit walking.  Lymphadenopathy:    He has no cervical adenopathy.  Neurological: He is alert and oriented to person, place, and time. He displays abnormal  reflex. No cranial nerve deficit. He exhibits normal muscle tone. Coordination normal.  Diminished DTR bilaterally and patella..  Skin: No rash noted. No erythema. No pallor.  Psychiatric: He has a normal mood and affect. His behavior is normal. Judgment and thought content normal.    Labs reviewed: Lab Summary Latest Ref Rng & Units 10/14/2015 04/15/2015 10/15/2014  Hemoglobin - (None) (None) (None)  Hematocrit - (None) (None) (None)  White count - (None) (None) (None)  Platelet count - (None) (None) (None)  Sodium 135 - 146 mmol/L 141 144 140  Potassium 3.5 - 5.3 mmol/L 3.7 3.8 4.7  Calcium 8.6 - 10.3 mg/dL 9.5 9.9 9.2  Phosphorus - (None) (  None) (None)  Creatinine 0.70 - 1.11 mg/dL 1.23(H) 0.98 1.16  AST 10 - 35 U/L 14 15 (None)  Alk Phos 40 - 115 U/L 71 86 (None)  Bilirubin 0.2 - 1.2 mg/dL 0.7 0.7 (None)  Glucose 65 - 99 mg/dL 115(H) 114(H) 76  Cholesterol 125 - 200 mg/dL 153 (None) (None)  HDL cholesterol >=40 mg/dL 48 (None) 31(L)  Triglycerides <150 mg/dL 131 (None) 131  LDL Direct - (None) (None) (None)  LDL Calc <130 mg/dL 79 (None) 83  Total protein 6.1 - 8.1 g/dL 6.8 (None) (None)  Albumin 3.6 - 5.1 g/dL 4.3 4.3 (None)  Some recent data might be hidden   Lab Results  Component Value Date   TSH 2.59 10/14/2015   TSH 1.760 04/15/2015   TSH 0.98 05/13/2012   Lab Results  Component Value Date   BUN 21 10/14/2015   BUN 17 04/15/2015   BUN 22 10/15/2014   Lab Results  Component Value Date   HGBA1C 5.5 10/14/2015   HGBA1C 5.7 (H) 04/15/2015   HGBA1C 6.1 (H) 10/15/2014    Assessment/Plan  1. Essential hypertension - Comprehensive metabolic panel  2. Hypothyroidism, unspecified hypothyroidism type - TSH; Future  3. Parkinson's plus syndrome ([place]) unchangd  4. Memory loss mild  5. Type 2 diabetes mellitus with autonomic neuropathy, unspecified long term insulin use status (HCC) mprehensive metabolic panel - Hemoglobin A1c; Future  6. Restless legs  syndrome (RLS) improved  7. Fever, unspecified Although , the patient seems "normal" at this time, he is worried that he may have another urine infection because of the chills and discomfort last night. - Urinalysis - Urine culture

## 2015-10-17 ENCOUNTER — Telehealth: Payer: Self-pay

## 2015-10-17 LAB — URINE CULTURE: Organism ID, Bacteria: NO GROWTH

## 2015-10-17 NOTE — Telephone Encounter (Signed)
Discussed with patient's wife. Mrs.Tandon verbalized understanding of response.  Per Shanda BumpsJessica add-on CBCD and advise for patient to seek medical attention if symptoms persist or progress. Mrs.Reigle Agreed

## 2015-10-17 NOTE — Telephone Encounter (Signed)
I am not familiar with pt- ok to wait on Dr Amanda CockayneGreens response; it appears pt did not have UTI, also to clarify temperature from today. Can use tylenol 650 mg PO q 6 hours PRN

## 2015-10-17 NOTE — Telephone Encounter (Signed)
Patient's wife called back questioning what to do about husbands fever and lower back pain. See urine culture results from yesterday   Patient's wife requested that I forward not to a in-office provider since Dr.Green had not responded yet.

## 2015-10-18 NOTE — Telephone Encounter (Signed)
Per lab unable to add CBCD- no lavender tube. Jessica aware and stated let's see how patient does

## 2015-12-07 ENCOUNTER — Other Ambulatory Visit: Payer: Self-pay | Admitting: Neurology

## 2015-12-09 ENCOUNTER — Telehealth: Payer: Self-pay | Admitting: Neurology

## 2015-12-09 NOTE — Telephone Encounter (Signed)
Wife made aware this was sent in this morning after receiving request from the pharmacy.

## 2015-12-09 NOTE — Telephone Encounter (Signed)
Patient states he needs a refill on his Anti depression medication pl;ease call patient back at [phone]

## 2015-12-19 ENCOUNTER — Telehealth: Payer: Self-pay | Admitting: Internal Medicine

## 2015-12-19 NOTE — Telephone Encounter (Signed)
Lm for pt to sched AWV w/[facility] in Nov if possible; j.strack

## 2015-12-31 ENCOUNTER — Encounter: Payer: Self-pay | Admitting: Neurology

## 2016-01-19 ENCOUNTER — Emergency Department (HOSPITAL_COMMUNITY)
Admission: EM | Admit: 2016-01-19 | Discharge: 2016-01-20 | Disposition: A | Discharge status: Home / Self Care | Payer: PPO | Attending: Emergency Medicine | Admitting: Emergency Medicine

## 2016-01-19 ENCOUNTER — Emergency Department (HOSPITAL_COMMUNITY): Payer: PPO

## 2016-01-19 ENCOUNTER — Encounter (HOSPITAL_COMMUNITY): Payer: Self-pay

## 2016-01-19 DIAGNOSIS — Z79899 Other long term (current) drug therapy: Secondary | ICD-10-CM | POA: Insufficient documentation

## 2016-01-19 DIAGNOSIS — I10 Essential (primary) hypertension: Secondary | ICD-10-CM | POA: Insufficient documentation

## 2016-01-19 DIAGNOSIS — Y939 Activity, unspecified: Secondary | ICD-10-CM | POA: Diagnosis not present

## 2016-01-19 DIAGNOSIS — Y999 Unspecified external cause status: Secondary | ICD-10-CM | POA: Insufficient documentation

## 2016-01-19 DIAGNOSIS — Z7982 Long term (current) use of aspirin: Secondary | ICD-10-CM | POA: Insufficient documentation

## 2016-01-19 DIAGNOSIS — X58XXXA Exposure to other specified factors, initial encounter: Secondary | ICD-10-CM | POA: Diagnosis not present

## 2016-01-19 DIAGNOSIS — R5381 Other malaise: Secondary | ICD-10-CM

## 2016-01-19 DIAGNOSIS — S80812A Abrasion, left lower leg, initial encounter: Secondary | ICD-10-CM | POA: Insufficient documentation

## 2016-01-19 DIAGNOSIS — Z87891 Personal history of nicotine dependence: Secondary | ICD-10-CM | POA: Insufficient documentation

## 2016-01-19 DIAGNOSIS — R5383 Other fatigue: Secondary | ICD-10-CM | POA: Diagnosis not present

## 2016-01-19 DIAGNOSIS — Z7984 Long term (current) use of oral hypoglycemic drugs: Secondary | ICD-10-CM | POA: Insufficient documentation

## 2016-01-19 DIAGNOSIS — R7989 Other specified abnormal findings of blood chemistry: Secondary | ICD-10-CM | POA: Diagnosis not present

## 2016-01-19 DIAGNOSIS — Y929 Unspecified place or not applicable: Secondary | ICD-10-CM | POA: Diagnosis not present

## 2016-01-19 DIAGNOSIS — S8992XA Unspecified injury of left lower leg, initial encounter: Secondary | ICD-10-CM | POA: Diagnosis present

## 2016-01-19 DIAGNOSIS — R778 Other specified abnormalities of plasma proteins: Secondary | ICD-10-CM

## 2016-01-19 DIAGNOSIS — E1143 Type 2 diabetes mellitus with diabetic autonomic (poly)neuropathy: Secondary | ICD-10-CM | POA: Diagnosis not present

## 2016-01-19 DIAGNOSIS — E039 Hypothyroidism, unspecified: Secondary | ICD-10-CM | POA: Insufficient documentation

## 2016-01-19 DIAGNOSIS — G20 Parkinson's disease: Secondary | ICD-10-CM | POA: Insufficient documentation

## 2016-01-19 LAB — URINALYSIS, ROUTINE W REFLEX MICROSCOPIC
Glucose, UA: NEGATIVE mg/dL
Hgb urine dipstick: NEGATIVE
KETONES UR: NEGATIVE mg/dL
LEUKOCYTES UA: NEGATIVE
NITRITE: NEGATIVE
PH: 5 (ref 5.0–8.0)
Protein, ur: NEGATIVE mg/dL
SPECIFIC GRAVITY, URINE: 1.041 — AB (ref 1.005–1.030)

## 2016-01-19 LAB — COMPREHENSIVE METABOLIC PANEL
ALBUMIN: 3.9 g/dL (ref 3.5–5.0)
ALT: 5 U/L — ABNORMAL LOW (ref 17–63)
AST: 20 U/L (ref 15–41)
Alkaline Phosphatase: 76 U/L (ref 38–126)
Anion gap: 11 (ref 5–15)
BUN: 28 mg/dL — AB (ref 6–20)
CHLORIDE: 104 mmol/L (ref 101–111)
CO2: 25 mmol/L (ref 22–32)
Calcium: 9.7 mg/dL (ref 8.9–10.3)
Creatinine, Ser: 1.25 mg/dL — ABNORMAL HIGH (ref 0.61–1.24)
GFR calc Af Amer: >60 mL/min (ref >60)
GFR calc non Af Amer: 52 mL/min — ABNORMAL LOW (ref >60)
GLUCOSE: 91 mg/dL (ref 65–99)
POTASSIUM: 4.2 mmol/L (ref 3.5–5.1)
Sodium: 140 mmol/L (ref 135–145)
Total Bilirubin: 0.6 mg/dL (ref 0.3–1.2)
Total Protein: 7.5 g/dL (ref 6.5–8.1)

## 2016-01-19 LAB — CBC WITH DIFFERENTIAL/PLATELET
BASOS ABS: 0 10*3/uL (ref 0.0–0.1)
BASOS PCT: 0 %
EOS PCT: 2 %
Eosinophils Absolute: 0.2 10*3/uL (ref 0.0–0.7)
HEMATOCRIT: 43.7 % (ref 39.0–52.0)
Hemoglobin: 14.4 g/dL (ref 13.0–17.0)
Lymphocytes Relative: 19 %
Lymphs Abs: 2.1 10*3/uL (ref 0.7–4.0)
MCH: 31 pg (ref 26.0–34.0)
MCHC: 33 g/dL (ref 30.0–36.0)
MCV: 94.2 fL (ref 78.0–100.0)
MONO ABS: 0.9 10*3/uL (ref 0.1–1.0)
Monocytes Relative: 8 %
NEUTROS ABS: 7.5 10*3/uL (ref 1.7–7.7)
Neutrophils Relative %: 71 %
PLATELETS: 342 10*3/uL (ref 150–400)
RBC: 4.64 MIL/uL (ref 4.22–5.81)
RDW: 13.6 % (ref 11.5–15.5)
WBC: 10.7 10*3/uL — AB (ref 4.0–10.5)

## 2016-01-19 LAB — I-STAT CG4 LACTIC ACID, ED
LACTIC ACID, VENOUS: 1.23 mmol/L (ref 0.5–1.9)
Lactic Acid, Venous: 2.4 mmol/L (ref 0.5–1.9)

## 2016-01-19 LAB — TROPONIN I: Troponin I: 0.07 ng/mL (ref <0.03)

## 2016-01-19 MED ORDER — SODIUM CHLORIDE 0.9 % IV BOLUS (SEPSIS)
500.0000 mL | Freq: Once | INTRAVENOUS | Status: AC
  Administered 2016-01-19: 500 mL via INTRAVENOUS

## 2016-01-19 MED ORDER — SODIUM CHLORIDE 0.9 % IV SOLN
Freq: Once | INTRAVENOUS | Status: AC
  Administered 2016-01-19: 23:00:00 via INTRAVENOUS

## 2016-01-19 NOTE — ED Provider Notes (Signed)
[facility]-EMERGENCY DEPT Provider Note   CSN: [id number] Arrival date & time: 01/19/16  1857     History   Chief Complaint Chief Complaint  Patient presents with  . Hypotension    HPI Nicholas Barnett is a 82 y.o. male.  HPI Patient was sent from urgent care for further evaluation. At urgent care, there was concern for EKG changes, new right bundle branch block and low blood pressure (96\66). The patient had symptoms of chills and subjective fever. Patient's wife reports that she knows when he has a fever but did not measure it. He has felt some general malaise. The patient's wife believes his urine is dark and decreased output. They're concerned for urinary tract infection because he has had this in the past. Patient denies other symptoms. He denies cough or sputum production. No abdominal pain vomiting or diarrhea. No chest pain. No skin rash or lower extremity swelling. Past Medical History:  Diagnosis Date  . Abnormality of gait   . Arthritis   . Bifascicular block   . Cyanocobalamin deficiency   . Depression   . Diverticulitis   . Dizzy   . Dysphagia, idiopathic    History of aspiration  . Gait disturbance   . Hearing loss of both ears   . Hyperlipidemia   . Hypertension   . Lumbar back pain   . Memory loss   . Neck pain   . Osteoarthritis   . Osteoporosis   . Other and unspecified hyperlipidemia   . Parkinsonism (HCC)   . Recurrent falls    Using a cane and walker  . Restless leg   . Restless legs syndrome (RLS)   . Right knee pain   . Shoulder pain   . Thoracic back pain   . Thyroid disease   . Type 2 diabetes mellitus with autonomic neuropathy (HCC)   . Unspecified essential hypertension   . Unspecified hypothyroidism   . Vitamin D deficiency     Patient Active Problem List   Diagnosis Date Noted  . Fever, unspecified 10/16/2015  . Depression, major 10/16/2015  . GERD (gastroesophageal reflux disease) 04/17/2015  . UTI (urinary tract infection)  10/17/2014  . Pain in the chest   . Chest pain 07/28/2014  . Abnormal EKG 07/28/2014  . Insomnia 10/17/2013  . Pain in joint, shoulder region 01/25/2013  . Dysphagia, idiopathic   . Recurrent falls   . Dizzy   . Hearing loss of both ears   . Neck pain   . Right knee pain   . Thoracic back pain   . Lumbar back pain   . Shoulder pain   . Cyanocobalamin deficiency   . Type 2 diabetes mellitus with autonomic neuropathy (HCC)   . Restless legs syndrome (RLS)   . Parkinson's plus syndrome (HCC) 11/24/2012  . Gait difficulty 11/24/2012  . NOCTURIA 05/08/2009  . SOMNOLENCE 04/11/2009  . Memory loss 04/11/2009  . ECZEMA 03/21/2009  . BPH (benign prostatic hyperplasia) 12/25/2008  . OSTEOARTHRITIS, KNEE, RIGHT 12/25/2008  . OSTEOPOROSIS 12/25/2008  . URINARY URGENCY 12/25/2008  . VITAMIN D DEFICIENCY 12/22/2007  . DIVERTICULITIS, HX OF 06/01/2007  . Hypothyroidism 12/16/2006  . Hyperlipemia 12/16/2006  . ERECTILE DYSFUNCTION 12/16/2006  . Essential hypertension 12/16/2006    Past Surgical History:  Procedure Laterality Date  . CATARACT EXTRACTION Bilateral 2012   Dr. Hazle Quantigby       Home Medications    Prior to Admission medications   Medication Sig Start Date End Date Taking?  Authorizing Provider  amLODipine (NORVASC) 5 MG tablet Take 5 mg by mouth daily.   Yes Historical Provider, MD  aspirin EC 81 MG tablet Take 81 mg by mouth daily.   Yes Historical Provider, MD  benazepril (LOTENSIN) 5 MG tablet Take 5 mg by mouth daily.   Yes Historical Provider, MD  Calcium Carbonate-Vitamin D (CALCIUM-CARB 600 + D) 600-125 MG-UNIT TABS Take 1 tablet by mouth 2 (two) times daily.    Yes Historical Provider, MD  carbidopa-levodopa (SINEMET IR) 25-100 MG tablet Take 3 tablets by mouth 2 (two) times daily.    Yes Historical Provider, MD  cyanocobalamin (,VITAMIN B-12,) 1000 MCG/ML injection Inject 1,000 mcg into the muscle every 30 (thirty) days. Pt receives on the 15th of every month.    Yes Historical Provider, MD  gabapentin (NEURONTIN) 300 MG capsule Take 300 mg by mouth at bedtime.   Yes Historical Provider, MD  glipiZIDE (GLUCOTROL XL) 5 MG 24 hr tablet Take 5 mg by mouth daily.   Yes Historical Provider, MD  metFORMIN (GLUCOPHAGE) 1000 MG tablet Take 1,000 mg by mouth 2 (two) times daily with a meal.   Yes Historical Provider, MD  mirtazapine (REMERON) 30 MG tablet Take 30 mg by mouth at bedtime.   Yes Historical Provider, MD  mupirocin ointment (BACTROBAN) 2 % Apply 1 application topically at bedtime. Applies to lesion on scrotum    Yes Historical Provider, MD  pantoprazole (PROTONIX) 40 MG tablet Take 40 mg by mouth 2 (two) times daily.   Yes Historical Provider, MD  pravastatin (PRAVACHOL) 20 MG tablet Take 20 mg by mouth at bedtime.   Yes Historical Provider, MD  tamsulosin (FLOMAX) 0.4 MG CAPS capsule Take 0.4 mg by mouth at bedtime.   Yes Historical Provider, MD    Family History Family History  Problem Relation Age of Onset  . Heart disease Mother   . Diabetes Mother   . Stroke Mother   . Cancer Father     prostate  . Diabetes Sister   . Heart disease Brother     Social History Social History  Substance Use Topics  . Smoking status: Former Smoker    Quit date: 01/31/1974  . Smokeless tobacco: Never Used  . Alcohol use No     Allergies   Caffeine   Review of Systems Review of Systems  10 Systems reviewed and are negative for acute change except as noted in the HPI.  Physical Exam Updated Vital Signs BP 128/74 (BP Location: Left Arm)   Pulse 68   Temp 98.4 F (36.9 C) (Oral)   Resp 22   Ht 6' (1.829 m)   Wt 191 lb (86.6 kg)   SpO2 97%   BMI 25.90 kg/m   Physical Exam  Constitutional: He appears well-developed and well-nourished.  Patient is well in appearance. He is alert and nontoxic. No respiratory distress.  HENT:  Head: Normocephalic and atraumatic.  Nose: Nose normal.  Mouth/Throat: Oropharynx is clear and moist.  Eyes:  Conjunctivae and EOM are normal. Pupils are equal, round, and reactive to light.  Neck: Neck supple.  Cardiovascular: Normal rate, regular rhythm, normal heart sounds and intact distal pulses.   No murmur heard. Pulmonary/Chest: Effort normal and breath sounds normal. No respiratory distress.  Abdominal: Soft. He exhibits no distension. There is no tenderness. There is no guarding.  Musculoskeletal: He exhibits no edema, tenderness or deformity.  Feet do not have any wounds or ulcerations. No cellulitis. Very superficial abrasion to  the tibial surface on the left lower extremity. No surrounding erythema or suggestion of cellulitis.  Neurological: He is alert. No cranial nerve deficit. He exhibits normal muscle tone. Coordination normal.  Skin: Skin is warm and dry. No rash noted.  Psychiatric: He has a normal mood and affect.  Nursing note and vitals reviewed.    ED Treatments / Results  Labs (all labs ordered are listed, but only abnormal results are displayed) Labs Reviewed  CBC WITH DIFFERENTIAL/PLATELET - Abnormal; Notable for the following:       Result Value   WBC 10.7 (*)    All other components within normal limits  COMPREHENSIVE METABOLIC PANEL - Abnormal; Notable for the following:    BUN 28 (*)    Creatinine, Ser 1.25 (*)    ALT 5 (*)    GFR calc non Af Amer 52 (*)    All other components within normal limits  URINALYSIS, ROUTINE W REFLEX MICROSCOPIC (NOT AT [facility]) - Abnormal; Notable for the following:    Color, Urine AMBER (*)    Specific Gravity, Urine 1.041 (*)    Bilirubin Urine SMALL (*)    All other components within normal limits  TROPONIN I - Abnormal; Notable for the following:    Troponin I 0.07 (*)    All other components within normal limits  I-STAT CG4 LACTIC ACID, ED - Abnormal; Notable for the following:    Lactic Acid, Venous 2.40 (*)    All other components within normal limits  CULTURE, BLOOD (ROUTINE X 2)  CULTURE, BLOOD (ROUTINE X 2)  TROPONIN  I  I-STAT CG4 LACTIC ACID, ED    EKG  EKG Interpretation  Date/Time:  Sunday January 19 2016 19:12:08 EST Ventricular Rate:  70 PR Interval:    QRS Duration: 153 QT Interval:  415 QTC Calculation: 448 R Axis:   -78 Text Interpretation:  Sinus rhythm RBBB and LAFB no change from old Confirmed by Donnald Garre, MD, Lebron Conners [phone]) on 01/19/2016 8:49:27 PM       Radiology Dg Chest 2 View  Result Date: 01/19/2016 CLINICAL DATA:  Hypotension with fever and chills EXAM: CHEST  2 VIEW COMPARISON:  07/28/2014, 11/04/2012 FINDINGS: No acute consolidation or pleural effusion. Peribronchial thickening is similar compared to previous exams. Stable cardiomediastinal silhouette with mildly tortuous aorta. No pneumothorax. IMPRESSION: No acute infiltrate or edema Electronically Signed   By: Jasmine Pang M.D.   On: 01/19/2016 19:49    Procedures Procedures (including critical care time)  Medications Ordered in ED Medications  sodium chloride 0.9 % bolus 500 mL (not administered)  0.9 %  sodium chloride infusion (not administered)     Initial Impression / Assessment and Plan / ED Course  I have reviewed the triage vital signs and the nursing notes.  Pertinent labs & imaging results that were available during my care of the patient were reviewed by me and considered in my medical decision making (see chart for details).  Clinical Course     Final Clinical Impressions(s) / ED Diagnoses   Final diagnoses:  Malaise and fatigue  Troponin I above reference range   Primary concern in seeking treatment today was malaise with subjective fever and chills. Patient does not have ill appearance and does not have focus of infection. Analysis is negative. Chest x-ray negative. Review of systems does not indicate a focus of infection.  Patient's evaluation urgent care today suggest a concern for abnormal EKG. Troponin has been obtained. Patient has not had chest pain.  Troponin has very mild elevation.  Patient has not had typical ischemic type symptom pattern. After discussing all the findings with family and the patient, plan will be to recheck 3 hour troponin for any changes and also recheck lactic acid. If no elevation in troponin and lactate has resolved, plan will be for discharge with close outpatient follow-up. Final disposition and labs will be reviewed by Dr. Burt EkPolina  New Prescriptions New Prescriptions   No medications on file     Arby BarretteMarcy Anavi Branscum, MD 01/19/16 2322

## 2016-01-19 NOTE — ED Notes (Signed)
Pt aware that a urine sample is needed.  Pt given urinal and informed to alert staff once he has used it.

## 2016-01-19 NOTE — ED Triage Notes (Signed)
Pt went to [facility] today.  Pt has had fever and chills.  Pt wasn't feeling well.  When arrived to clinic, initial complaint was malaise and decreased urination.  Facility chose to do an EKG.  Pt was told to come to ER b/c he had decreased bp, (96/63) new onset RBBB on EKG.  Told he needs further evaluation.  PT vitals stable in triage at this time.  Pt has no fever in triage.  Last tylenol was at 12

## 2016-01-20 LAB — TROPONIN I: TROPONIN I: 0.07 ng/mL — AB (ref <0.03)

## 2016-01-21 ENCOUNTER — Encounter: Payer: Self-pay | Admitting: Nurse Practitioner

## 2016-01-21 ENCOUNTER — Ambulatory Visit (INDEPENDENT_AMBULATORY_CARE_PROVIDER_SITE_OTHER): Payer: PPO | Admitting: Nurse Practitioner

## 2016-01-21 VITALS — BP 112/58 | HR 86 | Temp 98.0°F | Resp 18

## 2016-01-21 DIAGNOSIS — R531 Weakness: Secondary | ICD-10-CM | POA: Diagnosis not present

## 2016-01-21 DIAGNOSIS — R296 Repeated falls: Secondary | ICD-10-CM | POA: Diagnosis not present

## 2016-01-21 DIAGNOSIS — R269 Unspecified abnormalities of gait and mobility: Secondary | ICD-10-CM

## 2016-01-21 DIAGNOSIS — F332 Major depressive disorder, recurrent severe without psychotic features: Secondary | ICD-10-CM

## 2016-01-21 DIAGNOSIS — G232 Striatonigral degeneration: Secondary | ICD-10-CM | POA: Diagnosis not present

## 2016-01-21 DIAGNOSIS — I10 Essential (primary) hypertension: Secondary | ICD-10-CM

## 2016-01-21 DIAGNOSIS — Z87440 Personal history of urinary (tract) infections: Secondary | ICD-10-CM

## 2016-01-21 DIAGNOSIS — E1143 Type 2 diabetes mellitus with diabetic autonomic (poly)neuropathy: Secondary | ICD-10-CM

## 2016-01-21 NOTE — Progress Notes (Signed)
Careteam: Patient Care Team: Nicholas RelicArthur G Green, MD as PCP - General (Internal Medicine) Nicholas Fasterebecca S Tat, DO as Consulting Physician (Neurology) Nicholas Gosselinonald Gioffre, MD as Consulting Physician (Orthopedic Surgery) Nicholas Chimesonald Digby, MD as Consulting Physician (Ophthalmology) Nicholas Laymanavid R Patterson, MD as Consulting Physician (Gastroenterology)  Advanced Directive information Does Patient Have a Medical Advance Directive?: Yes, Type of Advance Directive: Healthcare Power of Nicholas Barnett;Living will  Allergies  Allergen Reactions  . Caffeine Swelling and Other (See Comments)    Reaction:  Joint swelling    Chief Complaint  Patient presents with  . Acute Visit    Follow up to Ed visit. Urgent care directed pt to ED due to abnormal EKG but nothing at ED.   Marland Kitchen. Other    needs refill on bactroban     HPI: Patient is a 82 y.o. male seen in the office today for a follow-up from the hospital and urgent care. Went to urgent care on 11/26 where an abnormal EKG was obtained; he was told to go to the emergency department for further evaluation. At urgent care, there was concern for EKG changes, new right bundle branch block and low blood pressure (96\66). The patient had symptoms of chills, subjective fever, and some general malaise. They were concerned for urinary tract infection because he has had this in the past. Patient denied other symptoms. He denied cough, sputum production, abdominal pain, vomiting, diarrhea, chest pain, skin rash or lower extremity swelling while in the Ed. No acute infection, urinalysis and chest x-ray were negative. He did have mild elevation of his first troponin (0.07), but not the second 3 hours later (0.07)  Patient initially went to urgent care for a fever and dark urine, the wife thought the patient was getting another urinary tract infection as he does have a history of these. Per the wife the urinalysis at the urgent care was also negative.   The patient checks his blood pressure  every morning and reports he has had no low readings.   The patient increased his fluid intake since being in the hospital and his urine has since lightened up. Denies any further fever or chills.   Weakness that is gradually progressing. The patient reports he slid out of bed about 1 week ago and had to have the fire department come get him up. The wife reports she does a lot of pulling at home and has to call their son-in-law sometimes to come help get the patient out of the bed. The son-in-law is not always available as he does a lot of traveling.   The patient reports his blood sugar is well controlled, that he does not get any really high or low readings.   Review of Systems:  Review of Systems  Constitutional: Positive for activity change (increased weakness, unable to move as well) and appetite change (not much of an appetite, does drink 1 Ensure/day). Negative for chills and fever.  HENT: Negative.   Eyes: Negative.   Respiratory: Negative for cough and shortness of breath.   Cardiovascular: Negative for chest pain and leg swelling.  Gastrointestinal: Negative for abdominal pain, diarrhea, nausea and vomiting.  Genitourinary: Negative for difficulty urinating, dysuria and hematuria.  Musculoskeletal: Positive for arthralgias (bilateral shoulder bursitis and knee pain) and gait problem.  Skin: Negative for rash and wound.  Neurological: Positive for weakness and light-headedness. Negative for headaches.    Past Medical History:  Diagnosis Date  . Abnormality of gait   . Arthritis   .  Bifascicular block   . Cyanocobalamin deficiency   . Depression   . Diverticulitis   . Dizzy   . Dysphagia, idiopathic    History of aspiration  . Gait disturbance   . Hearing loss of both ears   . Hyperlipidemia   . Hypertension   . Lumbar back pain   . Memory loss   . Neck pain   . Osteoarthritis   . Osteoporosis   . Other and unspecified hyperlipidemia   . Parkinsonism (HCC)   .  Recurrent falls    Using a cane and walker  . Restless leg   . Restless legs syndrome (RLS)   . Right knee pain   . Shoulder pain   . Thoracic back pain   . Thyroid disease   . Type 2 diabetes mellitus with autonomic neuropathy (HCC)   . Unspecified essential hypertension   . Unspecified hypothyroidism   . Vitamin D deficiency    Past Surgical History:  Procedure Laterality Date  . CATARACT EXTRACTION Bilateral 2012   Nicholas Barnett   Social History:   reports that he quit smoking about 42 years ago. He has never used smokeless tobacco. He reports that he does not drink alcohol or use drugs.  Family History  Problem Relation Age of Onset  . Heart disease Mother   . Diabetes Mother   . Stroke Mother   . Cancer Father     prostate  . Diabetes Sister   . Heart disease Brother     Medications: Patient's Medications  New Prescriptions   No medications on file  Previous Medications   AMLODIPINE (NORVASC) 5 MG TABLET    Take 5 mg by mouth daily.   ASPIRIN EC 81 MG TABLET    Take 81 mg by mouth daily.   BENAZEPRIL (LOTENSIN) 5 MG TABLET    Take 5 mg by mouth daily.   CALCIUM CARBONATE-VITAMIN D (CALCIUM-CARB 600 + D) 600-125 MG-UNIT TABS    Take 1 tablet by mouth 2 (two) times daily.    CARBIDOPA-LEVODOPA (SINEMET IR) 25-100 MG TABLET    Take 3 tablets by mouth 2 (two) times daily.    CYANOCOBALAMIN (,VITAMIN B-12,) 1000 MCG/ML INJECTION    Inject 1,000 mcg into the muscle every 30 (thirty) days. Pt receives on the 15th of every month.   GABAPENTIN (NEURONTIN) 300 MG CAPSULE    Take 300 mg by mouth at bedtime.   GLIPIZIDE (GLUCOTROL XL) 5 MG 24 HR TABLET    Take 5 mg by mouth daily.   METFORMIN (GLUCOPHAGE) 1000 MG TABLET    Take 1,000 mg by mouth 2 (two) times daily with a meal.   MIRTAZAPINE (REMERON) 30 MG TABLET    Take 30 mg by mouth at bedtime.   MUPIROCIN OINTMENT (BACTROBAN) 2 %    Apply 1 application topically at bedtime. Applies to lesion on scrotum    PANTOPRAZOLE  (PROTONIX) 40 MG TABLET    Take 40 mg by mouth 2 (two) times daily.   PRAVASTATIN (PRAVACHOL) 20 MG TABLET    Take 20 mg by mouth at bedtime.   TAMSULOSIN (FLOMAX) 0.4 MG CAPS CAPSULE    Take 0.4 mg by mouth at bedtime.  Modified Medications   No medications on file  Discontinued Medications   GABAPENTIN (NEURONTIN) 100 MG CAPSULE    Take 300 mg by mouth at bedtime.     Physical Exam:  Vitals:   01/21/16 1005  BP: (!) 112/58  Pulse: 86  Resp: 18  Temp: 98 F (36.7 C)  TempSrc: Oral  SpO2: 95%   There is no height or weight on file to calculate BMI.  Physical Exam  Constitutional: He is oriented to person, place, and time. He appears well-developed and well-nourished.  HENT:  Head: Normocephalic.  Neck: Normal range of motion.  Cardiovascular: Normal rate, regular rhythm and normal heart sounds.   Pulmonary/Chest: Effort normal. He has decreased breath sounds.  Abdominal: Soft. Bowel sounds are normal.  Musculoskeletal: He exhibits no edema.       Right shoulder: He exhibits decreased range of motion.  Grips and strength equal bilaterally.  Bilateral shoulder bursitis limits ROM in shoulders.   Neurological: He is alert and oriented to person, place, and time.  Unable to stand due to weakness.   Skin: Skin is warm, dry and intact.  Psychiatric: His behavior is normal. Judgment and thought content normal.  Flat affect.    Labs reviewed: Basic Metabolic Panel:  Recent Labs  16/10/96 0945 10/14/15 1018 01/19/16 1954  NA 144 141 140  K 3.8 3.7 4.2  CL 100 105 104  CO2 24 26 25   GLUCOSE 114* 115* 91  BUN 17 21 28*  CREATININE 0.98 1.23* 1.25*  CALCIUM 9.9 9.5 9.7  TSH 1.760 2.59  --    Liver Function Tests:  Recent Labs  04/15/15 0945 10/14/15 1018 01/19/16 1954  AST 15 14 20   ALT 15 9 5*  ALKPHOS 86 71 76  BILITOT 0.7 0.7 0.6  PROT 6.6 6.8 7.5  ALBUMIN 4.3 4.3 3.9   No results for input(s): LIPASE, AMYLASE in the last 8760 hours. No results for  input(s): AMMONIA in the last 8760 hours. CBC:  Recent Labs  01/19/16 1954  WBC 10.7*  NEUTROABS 7.5  HGB 14.4  HCT 43.7  MCV 94.2  PLT 342   Lipid Panel:  Recent Labs  10/14/15 1018  CHOL 153  HDL 48  LDLCALC 79  TRIG 131  CHOLHDL 3.2   TSH:  Recent Labs  04/15/15 0945 10/14/15 1018  TSH 1.760 2.59   A1C: Lab Results  Component Value Date   HGBA1C 5.5 10/14/2015     Assessment/Plan 1. Parkinson's plus syndrome (HCC) - Progressively worsening weakness. Requires assistance from his wife and at times from their son-in-law due to the weakness. He has had one slip to the floor recently for which he had to call the fire department to come get him up.  - Continue Sinemet IR 25-100 mg 3 tablets BID - Keep follow-up with Dr. Arbutus Leas on 02/25/16 - Ambulatory referral to Home Health - Ambulatory referral to [facility]  2. Severe episode of recurrent major depressive disorder, without psychotic features (HCC) - Patient not motivated and conts to eat poorly.  - Continue Remeron 30 mg QHS  3. Weakness - Unable to get out of bed without assistance from either his wife or son-in-law. Requires increasing assistance throughout the day.  - Ambulatory referral to Home Health - Ambulatory referral to [facility] for more resources.   4. Essential hypertension - Patient only had the one low BP reading at the urgent care on 11/26.  - BP in the hospital and on home BP machine have not been low. Patient checks his BP every morning.  - Continue Norvasc and Lotensin  5. History of recurrent UTIs - Initially wife was concerned patient was getting a UTI due to fever and dark colored urine. - Urinalysis negative at the urgent  care and emergency department on 11/26.  - No further fever or chills. The patient has increased his fluid intake and his urine has lightened in color.  - Encouraged to keep drinking plenty of fluids throughout the day.  6. Type 2 diabetes mellitus with  autonomic neuropathy, unspecified long term insulin use status (HCC) - Patient reports his blood sugars have been good, that he has not obtained any high or low readings.  - Most recent HgbA1c 5.5 on 8/21 - Controlled, continue glipizide and metformin.  7. Gait difficulty - Continues to have gait difficulty due to increased weakness and Parkinson's disease. - Referrals to home health and connected care.  8. Recurrent falls - The patient has had one slip to the floor recently for which he required the assistance of the fire department to get him up. - Referrals to home health and connected care placed.     Janene HarveyJessica K. Biagio BorgEubanks, AGNP  [facility] [phone](Monday-Friday 8 am - 5 pm) [phone] (after hours)

## 2016-01-21 NOTE — Patient Instructions (Addendum)
Continue to drink plenty of fluids throughout the day.  Home health referral placed  Keep follow up appts as scheduled

## 2016-01-23 ENCOUNTER — Encounter: Payer: Self-pay | Admitting: Physical Therapy

## 2016-01-23 NOTE — Therapy (Signed)
[facility] [address] [place], [place], [address] Phone: [phone]   Fax:  [phone]  Patient Details  Name: Nicholas Barnett MRN: [id number] Date of Birth: January 05, 1932 Referring Provider:  No ref. provider found  Encounter Date: 01/23/2016  PHYSICAL THERAPY DISCHARGE SUMMARY  Visits from Start of Care: 1 (Eval only-pt did not return after eval, as he did not schedule follow-up appointments)  Current functional level related to goals / functional outcomes: See eval   Remaining deficits: See eval   Education / Equipment: Unable to complete  Plan: Patient agrees to discharge.  Patient goals were not met. Patient is being discharged due to not returning since the last visit.  ?????      Kandise Riehle W. 01/23/2016, 11:32 AM  [facility]razier Butt., PT [facility] [address] [place], [place], [address] Phone: [phone]   Fax:  [phone]

## 2016-01-25 LAB — CULTURE, BLOOD (ROUTINE X 2)
CULTURE: NO GROWTH
Culture: NO GROWTH

## 2016-02-22 NOTE — Progress Notes (Signed)
Nicholas Barnett was seen today in the movement disorders clinic for neurologic consultation at the request of Murray Hodgkins, MD.  The consultation is for the evaluation of Parkinsonism.  The patient has previously seen Dr. Sandria Manly and Dr. Marjory Lies.  Records were reviewed from both of those physicians.  The patient's symptoms started in 2010.  First symptoms consisted of dizziness and falls.  He began to see Dr. Sandria Manly around the time that symptoms began.  He first had an MRI of the brain in February 2011 that demonstrated rather significant atrophy.  B12 at that time was found to be low at 181.  He had a swallow study in June, 2011 that demonstrated moderate dysphagia with intermittent aspiration of thin liquids.  He was tried on levodopa (I do not know what dose and they don't even remember the medication or how long he was on it) and that did not seem to help.  He was later tried on ropinirole which also did not help.  He was tried on, and is still on, gabapentin for restless leg syndrome.  His most recent MRI of the brain was on 11/21/2013 and I reviewed this as well as his previous MRI of the brain.  There remains very significant atrophy, although there is not significant basal ganglia disease.  There is mild to moderate small vessel disease.  02/13/14 update:  Pt was seen today for UPDRS motor on/off testing.  He is accompanied by his family who supplements the hx.  Pt had MBE on 02/05/14 and demonstrated mild oral and mod pharyngeal phase dysphagia and mechanical soft with honey thick liquid was recommended.  He is still awaiting ST to contact him (out pt).  05/15/14 update:  The patient is accompanied by his family supplements the history.  Patient has a history of multiple system atrophy.  He has been minimally responsive to levodopa via UPDRS on/off last visit but they wanted to retry the medication at last visit and he was told to take 2 tablets at 11 AM (wakeup time) 2 at 3 PM and 1 at 7 PM.  Pt  states that last visit he walked in here with a walker and left without it after the on/off test.  Has felt much better ever since.  He was doing well in PT but pt states that the last time he went he got put on a machine that hurt his shoulder.  His wife does state that the levodopa has helped him remarkably.  He is now able to do much of his own dressing and bathing and he was not able to do that previously.  No falls since our last visit.  Pt states that he could not deal with the thickened food and understands the risks of not following the recommended diet.  He is, however, eating better and has a much better appetite.  He has gained some weight.  He did not get speech therapy; wife states that they were to call back to set this up but didn't.    09/18/14 update:  The patient presents today, accompanied by his wife who supplements the history.  The patients records were reviewed since last visit.  Despite the fact that the patient's UPDRS motor on/off test did not show much benefit with levodopa, the patient and his wife think that the levodopa has been very beneficial at home and helped freezing.  Therefore, he has been on carbidopa/levodopa 25/100, 2 tablets in the morning, 2 in the afternoon and  one towards evening.  He doesn't think that's its working as well but his wife thinks that hes not taking it as prescribed.  He was admitted to the hospital from June 4 to June 6 with atypical chest pain.  While he was in the hospital he had a modified barium swallow study done on 07/30/2014.  This recommended a continued dysphasia 3 diet, this time with thin liquids with strict precautions.  There was evidence of moderate pharyngeal phase dysphagia.  Pt admits that he isn't following the diet.  Pt states that about 3 weeks ago he fell backward and hit his head on a patio table.  Wife states that it didn't hit hard and no LOC.  No other falls.  He did have an injection in both shoulders recently and told that he  would be candidate for shoulder replacement.  His shoulders are still hurting him.  07/02/15 update:  The patient is following up today, accompanied by his wife who supplements the history.  I have not seen the patient since July, 2016.  At that point in time, his levodopa was increased from a total of 500 mg per day to 700 mg per day.  He was supposed to be on the carbidopa/levodopa 25/100, 3 tablets in the morning, 3 in the afternoon and one in the evening.  The patient states that he now takes 4 in the AM and doesn't take any after that as he thinks that it causes headache.  He admits that he d/c it for a while and had no headache.  He doesn't think that the medication helps walking but it did help his "trembling."  He admits to falls; one time got off golf cart and walking into store and fell in doorway.  Another time, fell out near golf cart and wife didn't find him for 1.5 hours.  Had a fall out of bed while sleeping.  Wife states that he is usually very still in the bed.  No yelling out in the night.  His wife does sleep in the same bed with him.  His wife called me a few weeks after I increased the dose to state that it was helpful.  He has refused physical therapy, primarily because of pain elsewhere.  His wife recently called to make a follow-up appointment stating that his anxiety and depression have increased.  He admits to being depressed.  His wife admits to being frustrated over the fact that he does not move.  10/03/15 update:  The patient follows up today, accompanied by his wife who supplements the history.  I have reviewed records available to me from other providers as well.  Last visit, the patient was only taking levodopa in the morning and he did not think it was helpful, so I told him he could discontinue it.  I also referred him to physical therapy, but he only went one time and thought it was for a wheelchair evaluation, and refused to go back.  His wife called me after the discontinuation  of levodopa and stated that he had gotten worse.  I said that he could go back on it, but the patient refused but then ended up going back on 3 po bid.  She had also noticed personality changes after he went off of it, but he refused psychiatric therapy.  She did not think that the personality changes were related to the addition of Remeron because the timing did not seem to correlate with that.  Ran out of  that 2 days ago.  Wife not sure that "it was strong enough."  Asks about whether or not dose should be increased.  Comes in with crutches today and states that he can walk with those but not with walker or cane.  No falls since our last visit.  Denies swallowing issues unless he tries to take all of his pills at once, including his large calcium pill.  02/25/16 update:  Pt f/u today, accompanied by his wife who supplements the history.  Still on carbidopa/levodopa 25/100, 3 po bid. He doesn't think it works but wife does.  Last visit, remeron increased to 30 mg daily.  Pt states that sleep is much better.  The records that were made available to me were reviewed since last visit.  Went to ER at end of November after going to UC with c/o malaise and fatigue.  UC stated that BP was low and he had new RBBB.  ER workup unremarkable and d/c home and f/u with NP two days later.  Pt states that he has been stable and pt states that he has been "stable lousy."  He is not exercising.  Wife states that he is supposed to be starting in home PT this month.  States that he tried forearm crutches and they made his bursitis worse than with the crutches.  Had one fall out of the bed.  Wife states that she was gone and she tried to get him up before she left but he refused; he then tried to get himself out of the bed and ended up falling and had to wait for family to come help him up.  He asks about a RX for a hospital bed.  He is in the bed 14 hours per day.  He cannot maneuver himself out of the bed and takes 2 urinals at night  to relieve himself while in the bed.  He still has to have a pad in the bed.  States that it just leaks and he cannot feel it.  Wife changes pad daily.    Neuroimaging has previously been performed.  It is available for my review.  I reviewed pts MRI brain from 11/21/13.  There was no evidence of BG disease but there was very significant atrophy.    PREVIOUS MEDICATIONS: Sinemet and Requip  ALLERGIES:   Allergies  Allergen Reactions  . Caffeine Swelling and Other (See Comments)    Reaction:  Joint swelling    CURRENT MEDICATIONS:  Outpatient Encounter Prescriptions as of 02/25/2016  Medication Sig  . amLODipine (NORVASC) 5 MG tablet Take 5 mg by mouth daily.  Marland Kitchen. aspirin EC 81 MG tablet Take 81 mg by mouth daily.  . benazepril (LOTENSIN) 5 MG tablet Take 5 mg by mouth daily.  . Calcium Carbonate-Vitamin D (CALCIUM-CARB 600 + D) 600-125 MG-UNIT TABS Take 1 tablet by mouth 2 (two) times daily.   . carbidopa-levodopa (SINEMET IR) 25-100 MG tablet Take 3 tablets by mouth 2 (two) times daily.   . cyanocobalamin (,VITAMIN B-12,) 1000 MCG/ML injection Inject 1,000 mcg into the muscle every 30 (thirty) days. Pt receives on the 15th of every month.  . gabapentin (NEURONTIN) 300 MG capsule Take 300 mg by mouth at bedtime.  Marland Kitchen. glipiZIDE (GLUCOTROL XL) 5 MG 24 hr tablet Take 5 mg by mouth daily.  . metFORMIN (GLUCOPHAGE) 1000 MG tablet Take 1,000 mg by mouth 2 (two) times daily with a meal.  . mirtazapine (REMERON) 30 MG tablet Take 1 tablet (30  mg total) by mouth at bedtime.  . pantoprazole (PROTONIX) 40 MG tablet Take 40 mg by mouth 2 (two) times daily.  . pravastatin (PRAVACHOL) 20 MG tablet Take 20 mg by mouth at bedtime.  . tamsulosin (FLOMAX) 0.4 MG CAPS capsule Take 0.4 mg by mouth at bedtime.  . [DISCONTINUED] mirtazapine (REMERON) 30 MG tablet Take 30 mg by mouth at bedtime.  . [DISCONTINUED] mupirocin ointment (BACTROBAN) 2 % Apply 1 application topically at bedtime. Applies to lesion on  scrotum    No facility-administered encounter medications on file as of 02/25/2016.     PAST MEDICAL HISTORY:   Past Medical History:  Diagnosis Date  . Abnormality of gait   . Arthritis   . Bifascicular block   . Cyanocobalamin deficiency   . Depression   . Diverticulitis   . Dizzy   . Dysphagia, idiopathic    History of aspiration  . Gait disturbance   . Hearing loss of both ears   . Hyperlipidemia   . Hypertension   . Lumbar back pain   . Memory loss   . Neck pain   . Osteoarthritis   . Osteoporosis   . Other and unspecified hyperlipidemia   . Parkinsonism (HCC)   . Recurrent falls    Using a cane and walker  . Restless leg   . Restless legs syndrome (RLS)   . Right knee pain   . Shoulder pain   . Thoracic back pain   . Thyroid disease   . Type 2 diabetes mellitus with autonomic neuropathy (HCC)   . Unspecified essential hypertension   . Unspecified hypothyroidism   . Vitamin D deficiency     PAST SURGICAL HISTORY:   Past Surgical History:  Procedure Laterality Date  . CATARACT EXTRACTION Bilateral 2012   Dr. Hazle Quant    SOCIAL HISTORY:   Social History   Social History  . Marital status: Married    Spouse name: Kathie Rhodes  . Number of children: 3  . Years of education: College   Occupational History  . Retired Retired    Engineer, [employer]   Social History Main Topics  . Smoking status: Former Smoker    Quit date: 01/31/1974  . Smokeless tobacco: Never Used  . Alcohol use No  . Drug use: No  . Sexual activity: No   Other Topics Concern  . Not on file   Social History Narrative   Married 1956. Returned from a Production designer, theatre/television/film for [employer].   Patient lives at home with his family.   Caffeine Use: none   Patient has a living will and healthcare power of attorney.    FAMILY HISTORY:   Family Status  Relation Status  . Mother Deceased at age 86   stroke, MI  . Father Deceased at age 23   prostate cancer  . Sister Deceased at age 15   diabetic,  cirrhosis  . Brother Deceased at age 70 months   crib death  . Brother Deceased at age 87   MI    ROS:  A complete 10 system review of systems was obtained and was unremarkable apart from what is mentioned above.  PHYSICAL EXAMINATION:    VITALS:   Vitals:   02/25/16 1511  BP: 118/82  Pulse: 92  Weight: 186 lb (84.4 kg)  Height: 6' (1.829 m)   Wt Readings from Last 3 Encounters:  02/25/16 186 lb (84.4 kg)  01/19/16 191 lb (86.6 kg)  10/16/15 191 lb (86.6 kg)  GEN:  The patient appears stated age and is in NAD.   HEENT:  Normocephalic, atraumatic.  The mucous membranes are moist. The superficial temporal arteries are without ropiness or tenderness. CV:  RRR.   Lungs:  CTAB Neck/HEME:  There are no carotid bruits bilaterally.  There is neck flexion.    Neurological examination:  Orientation: The patient is alert and oriented x3. Cranial nerves: There is good facial symmetry. There is facial hypomimia.  The visual fields are full to confrontational testing. The speech is fluent and clear but hypophonic. Soft palate rises symmetrically and there is no tongue deviation. Hearing is intact to conversational tone. Sensation: Sensation is intact to light touch throughout.   Movement examination: Tone: There is normal tone in the RUE.  There is normal tone in the LUE and bilateral lower extremities.   Abnormal movements: none Coordination:  There is decreased RAM's on the R with slow toe taps on the right Gait and Station: The patient has  difficulty arising out of a deep-seated chair without the use of the hands and has to push off.    He uses crutches in a strange fashion that is unbalanced.  He drags the R leg and freezes in the door and turns en bloc.    ASSESSMENT/PLAN:  1.  Parkinsonism, vascular  -used to think he had MSA but now I think that perhaps it is just vascular parkinsonism based on watching him over time  -Minimal response to levodopa on our UPDRS motor  on/off test, but patient and his wife initially reported marked response to levodopa, even though I did not see that in the office.  When he stopped it, his wife thought he got much worse and she made him go back on it but he will only take it bid and he takes 3 po bid.  Wife thinks that he got better back on it.  She also thought that he was behaviorally worse off of it.  Pt asks again today if he can d/c it and I reminded him that family thought that he was worse off of it and also reminded him that IF it was going to work, tid dosing would be best.  He wants to continue as is.  -Needs physical therapy but refuses.  States that he went to one session last time and got worse and refuses to go back.    -RX for hospital bed  2.  Dysphagia.  -he had a modified barium swallow study done on 07/30/2014.  This recommended a continued dysphasia 3 diet, this time with thin liquids with strict precautions.  He is not doing that and understands the risks of morbidity and mortality.  He denies that he has this issue 3.  Sialorrhea.  -Talked about the value of Myobloc.  He will let me know if and when he would like to proceed. 4.  Anxiety, depression and some difficulty getting to sleep  -continue remeron 30 mg.  Pt/wife both think that it has been helpful 5.  Coming to our office has been difficult, and for now it would be easiest if his primary care physician would take over the refills on his levodopa and Remeron.  His wife states that his primary care physician has ordered a taking over the levodopa.  I refilled the Remeron today.  He can follow-up as needed, as long as his primary care physician feels comfortable with refilling his meds.  Much greater than 50% of this visit was spent in  counseling and coordinating care.  Total face to face time:  25 min

## 2016-02-25 ENCOUNTER — Ambulatory Visit (INDEPENDENT_AMBULATORY_CARE_PROVIDER_SITE_OTHER): Payer: PPO | Admitting: Neurology

## 2016-02-25 ENCOUNTER — Encounter: Payer: Self-pay | Admitting: Neurology

## 2016-02-25 VITALS — BP 118/82 | HR 92 | Ht 72.0 in | Wt 186.0 lb

## 2016-02-25 DIAGNOSIS — G214 Vascular parkinsonism: Secondary | ICD-10-CM

## 2016-02-25 DIAGNOSIS — G20 Parkinson's disease: Secondary | ICD-10-CM | POA: Diagnosis not present

## 2016-02-25 MED ORDER — MIRTAZAPINE 30 MG PO TABS: 30.0000 mg | ORAL_TABLET | Freq: Every day | ORAL | 1 refills | Status: DC

## 2016-02-28 DIAGNOSIS — G20 Parkinson's disease: Secondary | ICD-10-CM | POA: Diagnosis not present

## 2016-03-03 DIAGNOSIS — Z7982 Long term (current) use of aspirin: Secondary | ICD-10-CM | POA: Diagnosis not present

## 2016-03-03 DIAGNOSIS — M7551 Bursitis of right shoulder: Secondary | ICD-10-CM | POA: Diagnosis not present

## 2016-03-03 DIAGNOSIS — M1990 Unspecified osteoarthritis, unspecified site: Secondary | ICD-10-CM | POA: Diagnosis not present

## 2016-03-03 DIAGNOSIS — E785 Hyperlipidemia, unspecified: Secondary | ICD-10-CM | POA: Diagnosis not present

## 2016-03-03 DIAGNOSIS — Z9181 History of falling: Secondary | ICD-10-CM | POA: Diagnosis not present

## 2016-03-03 DIAGNOSIS — M810 Age-related osteoporosis without current pathological fracture: Secondary | ICD-10-CM | POA: Diagnosis not present

## 2016-03-03 DIAGNOSIS — E039 Hypothyroidism, unspecified: Secondary | ICD-10-CM | POA: Diagnosis not present

## 2016-03-03 DIAGNOSIS — R1310 Dysphagia, unspecified: Secondary | ICD-10-CM

## 2016-03-03 DIAGNOSIS — E1143 Type 2 diabetes mellitus with diabetic autonomic (poly)neuropathy: Secondary | ICD-10-CM | POA: Diagnosis not present

## 2016-03-03 DIAGNOSIS — G232 Striatonigral degeneration: Secondary | ICD-10-CM | POA: Diagnosis not present

## 2016-03-03 DIAGNOSIS — M7552 Bursitis of left shoulder: Secondary | ICD-10-CM | POA: Diagnosis not present

## 2016-03-03 DIAGNOSIS — F332 Major depressive disorder, recurrent severe without psychotic features: Secondary | ICD-10-CM

## 2016-03-03 DIAGNOSIS — I10 Essential (primary) hypertension: Secondary | ICD-10-CM | POA: Diagnosis not present

## 2016-03-03 DIAGNOSIS — Z87891 Personal history of nicotine dependence: Secondary | ICD-10-CM | POA: Diagnosis not present

## 2016-03-03 DIAGNOSIS — Z7984 Long term (current) use of oral hypoglycemic drugs: Secondary | ICD-10-CM | POA: Diagnosis not present

## 2016-03-03 DIAGNOSIS — Z87440 Personal history of urinary (tract) infections: Secondary | ICD-10-CM | POA: Diagnosis not present

## 2016-03-03 DIAGNOSIS — H9193 Unspecified hearing loss, bilateral: Secondary | ICD-10-CM | POA: Diagnosis not present

## 2016-03-05 ENCOUNTER — Ambulatory Visit: Payer: PPO | Admitting: Neurology

## 2016-03-09 ENCOUNTER — Other Ambulatory Visit: Payer: Self-pay | Admitting: *Deleted

## 2016-03-09 DIAGNOSIS — E1143 Type 2 diabetes mellitus with diabetic autonomic (poly)neuropathy: Secondary | ICD-10-CM | POA: Diagnosis not present

## 2016-03-09 DIAGNOSIS — Z9181 History of falling: Secondary | ICD-10-CM | POA: Diagnosis not present

## 2016-03-09 DIAGNOSIS — H9193 Unspecified hearing loss, bilateral: Secondary | ICD-10-CM | POA: Diagnosis not present

## 2016-03-09 DIAGNOSIS — Z7982 Long term (current) use of aspirin: Secondary | ICD-10-CM | POA: Diagnosis not present

## 2016-03-09 DIAGNOSIS — M7551 Bursitis of right shoulder: Secondary | ICD-10-CM | POA: Diagnosis not present

## 2016-03-09 DIAGNOSIS — G232 Striatonigral degeneration: Secondary | ICD-10-CM | POA: Diagnosis not present

## 2016-03-09 DIAGNOSIS — E039 Hypothyroidism, unspecified: Secondary | ICD-10-CM | POA: Diagnosis not present

## 2016-03-09 DIAGNOSIS — R1310 Dysphagia, unspecified: Secondary | ICD-10-CM | POA: Diagnosis not present

## 2016-03-09 DIAGNOSIS — M810 Age-related osteoporosis without current pathological fracture: Secondary | ICD-10-CM | POA: Diagnosis not present

## 2016-03-09 DIAGNOSIS — Z87440 Personal history of urinary (tract) infections: Secondary | ICD-10-CM | POA: Diagnosis not present

## 2016-03-09 DIAGNOSIS — E785 Hyperlipidemia, unspecified: Secondary | ICD-10-CM | POA: Diagnosis not present

## 2016-03-09 DIAGNOSIS — Z87891 Personal history of nicotine dependence: Secondary | ICD-10-CM | POA: Diagnosis not present

## 2016-03-09 DIAGNOSIS — M1990 Unspecified osteoarthritis, unspecified site: Secondary | ICD-10-CM | POA: Diagnosis not present

## 2016-03-09 DIAGNOSIS — Z7984 Long term (current) use of oral hypoglycemic drugs: Secondary | ICD-10-CM | POA: Diagnosis not present

## 2016-03-09 DIAGNOSIS — I10 Essential (primary) hypertension: Secondary | ICD-10-CM | POA: Diagnosis not present

## 2016-03-09 DIAGNOSIS — M7552 Bursitis of left shoulder: Secondary | ICD-10-CM | POA: Diagnosis not present

## 2016-03-09 MED ORDER — PRAVASTATIN SODIUM 20 MG PO TABS: 20.0000 mg | ORAL_TABLET | Freq: Every day | ORAL | 1 refills | Status: DC

## 2016-03-09 MED ORDER — PANTOPRAZOLE SODIUM 40 MG PO TBEC: 40.0000 mg | DELAYED_RELEASE_TABLET | Freq: Two times a day (BID) | ORAL | 1 refills | Status: DC

## 2016-03-09 MED ORDER — METFORMIN HCL 1000 MG PO TABS: 1000.0000 mg | ORAL_TABLET | Freq: Two times a day (BID) | ORAL | 1 refills | Status: DC

## 2016-03-09 NOTE — Telephone Encounter (Signed)
[facility] 

## 2016-03-10 ENCOUNTER — Other Ambulatory Visit: Payer: Self-pay | Admitting: *Deleted

## 2016-03-10 MED ORDER — AMLODIPINE BESYLATE 5 MG PO TABS: 5.0000 mg | ORAL_TABLET | Freq: Every day | ORAL | 3 refills | Status: DC

## 2016-03-10 MED ORDER — MIRTAZAPINE 30 MG PO TABS: 30.0000 mg | ORAL_TABLET | Freq: Every day | ORAL | 3 refills | Status: DC

## 2016-03-10 MED ORDER — TAMSULOSIN HCL 0.4 MG PO CAPS: 0.4000 mg | ORAL_CAPSULE | Freq: Every day | ORAL | 3 refills | Status: DC

## 2016-03-10 MED ORDER — GABAPENTIN 300 MG PO CAPS: 300.0000 mg | ORAL_CAPSULE | Freq: Every day | ORAL | 3 refills | Status: DC

## 2016-03-10 MED ORDER — BENAZEPRIL HCL 5 MG PO TABS: 5.0000 mg | ORAL_TABLET | Freq: Every day | ORAL | 3 refills | Status: DC

## 2016-03-10 NOTE — Telephone Encounter (Signed)
[facility] 

## 2016-03-13 DIAGNOSIS — M7552 Bursitis of left shoulder: Secondary | ICD-10-CM | POA: Diagnosis not present

## 2016-03-13 DIAGNOSIS — M810 Age-related osteoporosis without current pathological fracture: Secondary | ICD-10-CM | POA: Diagnosis not present

## 2016-03-13 DIAGNOSIS — M1990 Unspecified osteoarthritis, unspecified site: Secondary | ICD-10-CM | POA: Diagnosis not present

## 2016-03-13 DIAGNOSIS — G232 Striatonigral degeneration: Secondary | ICD-10-CM | POA: Diagnosis not present

## 2016-03-13 DIAGNOSIS — E1143 Type 2 diabetes mellitus with diabetic autonomic (poly)neuropathy: Secondary | ICD-10-CM | POA: Diagnosis not present

## 2016-03-13 DIAGNOSIS — Z9181 History of falling: Secondary | ICD-10-CM | POA: Diagnosis not present

## 2016-03-13 DIAGNOSIS — Z87440 Personal history of urinary (tract) infections: Secondary | ICD-10-CM | POA: Diagnosis not present

## 2016-03-13 DIAGNOSIS — Z7982 Long term (current) use of aspirin: Secondary | ICD-10-CM | POA: Diagnosis not present

## 2016-03-13 DIAGNOSIS — E039 Hypothyroidism, unspecified: Secondary | ICD-10-CM | POA: Diagnosis not present

## 2016-03-13 DIAGNOSIS — E785 Hyperlipidemia, unspecified: Secondary | ICD-10-CM | POA: Diagnosis not present

## 2016-03-13 DIAGNOSIS — H9193 Unspecified hearing loss, bilateral: Secondary | ICD-10-CM | POA: Diagnosis not present

## 2016-03-13 DIAGNOSIS — M7551 Bursitis of right shoulder: Secondary | ICD-10-CM | POA: Diagnosis not present

## 2016-03-13 DIAGNOSIS — I10 Essential (primary) hypertension: Secondary | ICD-10-CM | POA: Diagnosis not present

## 2016-03-13 DIAGNOSIS — Z7984 Long term (current) use of oral hypoglycemic drugs: Secondary | ICD-10-CM | POA: Diagnosis not present

## 2016-03-13 DIAGNOSIS — R1310 Dysphagia, unspecified: Secondary | ICD-10-CM | POA: Diagnosis not present

## 2016-03-13 DIAGNOSIS — Z87891 Personal history of nicotine dependence: Secondary | ICD-10-CM | POA: Diagnosis not present

## 2016-03-16 DIAGNOSIS — M1990 Unspecified osteoarthritis, unspecified site: Secondary | ICD-10-CM | POA: Diagnosis not present

## 2016-03-16 DIAGNOSIS — E039 Hypothyroidism, unspecified: Secondary | ICD-10-CM | POA: Diagnosis not present

## 2016-03-16 DIAGNOSIS — Z9181 History of falling: Secondary | ICD-10-CM | POA: Diagnosis not present

## 2016-03-16 DIAGNOSIS — I10 Essential (primary) hypertension: Secondary | ICD-10-CM | POA: Diagnosis not present

## 2016-03-16 DIAGNOSIS — Z87891 Personal history of nicotine dependence: Secondary | ICD-10-CM | POA: Diagnosis not present

## 2016-03-16 DIAGNOSIS — Z7982 Long term (current) use of aspirin: Secondary | ICD-10-CM | POA: Diagnosis not present

## 2016-03-16 DIAGNOSIS — H9193 Unspecified hearing loss, bilateral: Secondary | ICD-10-CM | POA: Diagnosis not present

## 2016-03-16 DIAGNOSIS — M7552 Bursitis of left shoulder: Secondary | ICD-10-CM | POA: Diagnosis not present

## 2016-03-16 DIAGNOSIS — E785 Hyperlipidemia, unspecified: Secondary | ICD-10-CM | POA: Diagnosis not present

## 2016-03-16 DIAGNOSIS — M810 Age-related osteoporosis without current pathological fracture: Secondary | ICD-10-CM | POA: Diagnosis not present

## 2016-03-16 DIAGNOSIS — Z7984 Long term (current) use of oral hypoglycemic drugs: Secondary | ICD-10-CM | POA: Diagnosis not present

## 2016-03-16 DIAGNOSIS — M7551 Bursitis of right shoulder: Secondary | ICD-10-CM | POA: Diagnosis not present

## 2016-03-16 DIAGNOSIS — E1143 Type 2 diabetes mellitus with diabetic autonomic (poly)neuropathy: Secondary | ICD-10-CM | POA: Diagnosis not present

## 2016-03-16 DIAGNOSIS — Z87440 Personal history of urinary (tract) infections: Secondary | ICD-10-CM | POA: Diagnosis not present

## 2016-03-16 DIAGNOSIS — G232 Striatonigral degeneration: Secondary | ICD-10-CM | POA: Diagnosis not present

## 2016-03-16 DIAGNOSIS — R1310 Dysphagia, unspecified: Secondary | ICD-10-CM | POA: Diagnosis not present

## 2016-03-19 DIAGNOSIS — M7552 Bursitis of left shoulder: Secondary | ICD-10-CM | POA: Diagnosis not present

## 2016-03-19 DIAGNOSIS — M810 Age-related osteoporosis without current pathological fracture: Secondary | ICD-10-CM | POA: Diagnosis not present

## 2016-03-19 DIAGNOSIS — I10 Essential (primary) hypertension: Secondary | ICD-10-CM | POA: Diagnosis not present

## 2016-03-19 DIAGNOSIS — Z9181 History of falling: Secondary | ICD-10-CM | POA: Diagnosis not present

## 2016-03-19 DIAGNOSIS — E1143 Type 2 diabetes mellitus with diabetic autonomic (poly)neuropathy: Secondary | ICD-10-CM | POA: Diagnosis not present

## 2016-03-19 DIAGNOSIS — M7551 Bursitis of right shoulder: Secondary | ICD-10-CM | POA: Diagnosis not present

## 2016-03-19 DIAGNOSIS — Z7982 Long term (current) use of aspirin: Secondary | ICD-10-CM | POA: Diagnosis not present

## 2016-03-19 DIAGNOSIS — M1990 Unspecified osteoarthritis, unspecified site: Secondary | ICD-10-CM | POA: Diagnosis not present

## 2016-03-19 DIAGNOSIS — Z87440 Personal history of urinary (tract) infections: Secondary | ICD-10-CM | POA: Diagnosis not present

## 2016-03-19 DIAGNOSIS — Z87891 Personal history of nicotine dependence: Secondary | ICD-10-CM | POA: Diagnosis not present

## 2016-03-19 DIAGNOSIS — R1310 Dysphagia, unspecified: Secondary | ICD-10-CM | POA: Diagnosis not present

## 2016-03-19 DIAGNOSIS — E785 Hyperlipidemia, unspecified: Secondary | ICD-10-CM | POA: Diagnosis not present

## 2016-03-19 DIAGNOSIS — Z7984 Long term (current) use of oral hypoglycemic drugs: Secondary | ICD-10-CM | POA: Diagnosis not present

## 2016-03-19 DIAGNOSIS — G232 Striatonigral degeneration: Secondary | ICD-10-CM | POA: Diagnosis not present

## 2016-03-19 DIAGNOSIS — H9193 Unspecified hearing loss, bilateral: Secondary | ICD-10-CM | POA: Diagnosis not present

## 2016-03-19 DIAGNOSIS — E039 Hypothyroidism, unspecified: Secondary | ICD-10-CM | POA: Diagnosis not present

## 2016-03-20 ENCOUNTER — Telehealth: Payer: Self-pay

## 2016-03-20 NOTE — Telephone Encounter (Signed)
Called patient's wife and questioned if patient requested supplies from [facility]. Patient's wife thinks patient is getting his supplies from his mail order company.  Patient's wife will contact the mail order company and call back with a status update. Form from [facility] was placed in my mailbox

## 2016-03-23 ENCOUNTER — Other Ambulatory Visit: Payer: Self-pay | Admitting: *Deleted

## 2016-03-23 DIAGNOSIS — E785 Hyperlipidemia, unspecified: Secondary | ICD-10-CM | POA: Diagnosis not present

## 2016-03-23 DIAGNOSIS — Z7984 Long term (current) use of oral hypoglycemic drugs: Secondary | ICD-10-CM | POA: Diagnosis not present

## 2016-03-23 DIAGNOSIS — E1143 Type 2 diabetes mellitus with diabetic autonomic (poly)neuropathy: Secondary | ICD-10-CM | POA: Diagnosis not present

## 2016-03-23 DIAGNOSIS — H9193 Unspecified hearing loss, bilateral: Secondary | ICD-10-CM | POA: Diagnosis not present

## 2016-03-23 DIAGNOSIS — M7552 Bursitis of left shoulder: Secondary | ICD-10-CM | POA: Diagnosis not present

## 2016-03-23 DIAGNOSIS — Z87891 Personal history of nicotine dependence: Secondary | ICD-10-CM | POA: Diagnosis not present

## 2016-03-23 DIAGNOSIS — E039 Hypothyroidism, unspecified: Secondary | ICD-10-CM | POA: Diagnosis not present

## 2016-03-23 DIAGNOSIS — R1310 Dysphagia, unspecified: Secondary | ICD-10-CM | POA: Diagnosis not present

## 2016-03-23 DIAGNOSIS — G232 Striatonigral degeneration: Secondary | ICD-10-CM | POA: Diagnosis not present

## 2016-03-23 DIAGNOSIS — M7551 Bursitis of right shoulder: Secondary | ICD-10-CM | POA: Diagnosis not present

## 2016-03-23 DIAGNOSIS — M1990 Unspecified osteoarthritis, unspecified site: Secondary | ICD-10-CM | POA: Diagnosis not present

## 2016-03-23 DIAGNOSIS — Z9181 History of falling: Secondary | ICD-10-CM | POA: Diagnosis not present

## 2016-03-23 DIAGNOSIS — Z7982 Long term (current) use of aspirin: Secondary | ICD-10-CM | POA: Diagnosis not present

## 2016-03-23 DIAGNOSIS — M810 Age-related osteoporosis without current pathological fracture: Secondary | ICD-10-CM | POA: Diagnosis not present

## 2016-03-23 DIAGNOSIS — I10 Essential (primary) hypertension: Secondary | ICD-10-CM | POA: Diagnosis not present

## 2016-03-23 DIAGNOSIS — Z87440 Personal history of urinary (tract) infections: Secondary | ICD-10-CM | POA: Diagnosis not present

## 2016-03-23 MED ORDER — GLIPIZIDE ER 5 MG PO TB24: 5.0000 mg | ORAL_TABLET | Freq: Every day | ORAL | 3 refills | Status: DC

## 2016-03-23 MED ORDER — CARBIDOPA-LEVODOPA 25-100 MG PO TABS: 3.0000 | ORAL_TABLET | Freq: Two times a day (BID) | ORAL | 3 refills | Status: DC

## 2016-03-23 NOTE — Telephone Encounter (Signed)
[facility] 

## 2016-03-25 ENCOUNTER — Other Ambulatory Visit: Payer: Self-pay | Admitting: *Deleted

## 2016-03-25 MED ORDER — ONETOUCH ULTRA SYSTEM W/DEVICE KIT: 1.0000 | PACK | Freq: Once | 0 refills | Status: AC

## 2016-03-25 MED ORDER — GLUCOSE BLOOD VI STRP: ORAL_STRIP | 3 refills | Status: DC

## 2016-03-25 NOTE — Telephone Encounter (Signed)
[facility] 

## 2016-03-26 DIAGNOSIS — M7551 Bursitis of right shoulder: Secondary | ICD-10-CM | POA: Diagnosis not present

## 2016-03-26 DIAGNOSIS — M7552 Bursitis of left shoulder: Secondary | ICD-10-CM | POA: Diagnosis not present

## 2016-03-26 DIAGNOSIS — Z9181 History of falling: Secondary | ICD-10-CM | POA: Diagnosis not present

## 2016-03-26 DIAGNOSIS — H9193 Unspecified hearing loss, bilateral: Secondary | ICD-10-CM | POA: Diagnosis not present

## 2016-03-26 DIAGNOSIS — Z87440 Personal history of urinary (tract) infections: Secondary | ICD-10-CM | POA: Diagnosis not present

## 2016-03-26 DIAGNOSIS — Z7982 Long term (current) use of aspirin: Secondary | ICD-10-CM | POA: Diagnosis not present

## 2016-03-26 DIAGNOSIS — E785 Hyperlipidemia, unspecified: Secondary | ICD-10-CM | POA: Diagnosis not present

## 2016-03-26 DIAGNOSIS — M1990 Unspecified osteoarthritis, unspecified site: Secondary | ICD-10-CM | POA: Diagnosis not present

## 2016-03-26 DIAGNOSIS — G232 Striatonigral degeneration: Secondary | ICD-10-CM | POA: Diagnosis not present

## 2016-03-26 DIAGNOSIS — M810 Age-related osteoporosis without current pathological fracture: Secondary | ICD-10-CM | POA: Diagnosis not present

## 2016-03-26 DIAGNOSIS — E1143 Type 2 diabetes mellitus with diabetic autonomic (poly)neuropathy: Secondary | ICD-10-CM | POA: Diagnosis not present

## 2016-03-26 DIAGNOSIS — E039 Hypothyroidism, unspecified: Secondary | ICD-10-CM | POA: Diagnosis not present

## 2016-03-26 DIAGNOSIS — R1310 Dysphagia, unspecified: Secondary | ICD-10-CM | POA: Diagnosis not present

## 2016-03-26 DIAGNOSIS — Z87891 Personal history of nicotine dependence: Secondary | ICD-10-CM | POA: Diagnosis not present

## 2016-03-26 DIAGNOSIS — I10 Essential (primary) hypertension: Secondary | ICD-10-CM | POA: Diagnosis not present

## 2016-03-26 DIAGNOSIS — Z7984 Long term (current) use of oral hypoglycemic drugs: Secondary | ICD-10-CM | POA: Diagnosis not present

## 2016-03-30 DIAGNOSIS — G20 Parkinson's disease: Secondary | ICD-10-CM | POA: Diagnosis not present

## 2016-03-31 ENCOUNTER — Other Ambulatory Visit: Payer: Self-pay | Admitting: *Deleted

## 2016-03-31 DIAGNOSIS — M7551 Bursitis of right shoulder: Secondary | ICD-10-CM | POA: Diagnosis not present

## 2016-03-31 DIAGNOSIS — M7552 Bursitis of left shoulder: Secondary | ICD-10-CM | POA: Diagnosis not present

## 2016-03-31 DIAGNOSIS — Z7984 Long term (current) use of oral hypoglycemic drugs: Secondary | ICD-10-CM | POA: Diagnosis not present

## 2016-03-31 DIAGNOSIS — I10 Essential (primary) hypertension: Secondary | ICD-10-CM | POA: Diagnosis not present

## 2016-03-31 DIAGNOSIS — E039 Hypothyroidism, unspecified: Secondary | ICD-10-CM | POA: Diagnosis not present

## 2016-03-31 DIAGNOSIS — R1310 Dysphagia, unspecified: Secondary | ICD-10-CM | POA: Diagnosis not present

## 2016-03-31 DIAGNOSIS — H9193 Unspecified hearing loss, bilateral: Secondary | ICD-10-CM | POA: Diagnosis not present

## 2016-03-31 DIAGNOSIS — Z7982 Long term (current) use of aspirin: Secondary | ICD-10-CM | POA: Diagnosis not present

## 2016-03-31 DIAGNOSIS — E785 Hyperlipidemia, unspecified: Secondary | ICD-10-CM | POA: Diagnosis not present

## 2016-03-31 DIAGNOSIS — M810 Age-related osteoporosis without current pathological fracture: Secondary | ICD-10-CM | POA: Diagnosis not present

## 2016-03-31 DIAGNOSIS — Z87891 Personal history of nicotine dependence: Secondary | ICD-10-CM | POA: Diagnosis not present

## 2016-03-31 DIAGNOSIS — Z9181 History of falling: Secondary | ICD-10-CM | POA: Diagnosis not present

## 2016-03-31 DIAGNOSIS — Z87440 Personal history of urinary (tract) infections: Secondary | ICD-10-CM | POA: Diagnosis not present

## 2016-03-31 DIAGNOSIS — E1143 Type 2 diabetes mellitus with diabetic autonomic (poly)neuropathy: Secondary | ICD-10-CM | POA: Diagnosis not present

## 2016-03-31 DIAGNOSIS — M1990 Unspecified osteoarthritis, unspecified site: Secondary | ICD-10-CM | POA: Diagnosis not present

## 2016-03-31 DIAGNOSIS — G232 Striatonigral degeneration: Secondary | ICD-10-CM | POA: Diagnosis not present

## 2016-03-31 MED ORDER — CYANOCOBALAMIN 1000 MCG/ML IJ SOLN: 1000.0000 ug | INTRAMUSCULAR | 5 refills | Status: DC

## 2016-03-31 NOTE — Telephone Encounter (Signed)
[facility]

## 2016-04-02 ENCOUNTER — Telehealth: Payer: Self-pay

## 2016-04-02 NOTE — Telephone Encounter (Signed)
Rob with [facility] called to request verbal orders for 2 x weekly for 2 weeks.  Per [facility] standing order, verbal order given. Message will be sent to patient's provider as a FYI.

## 2016-04-02 NOTE — Telephone Encounter (Signed)
noted 

## 2016-04-03 DIAGNOSIS — G232 Striatonigral degeneration: Secondary | ICD-10-CM | POA: Diagnosis not present

## 2016-04-03 DIAGNOSIS — E039 Hypothyroidism, unspecified: Secondary | ICD-10-CM | POA: Diagnosis not present

## 2016-04-03 DIAGNOSIS — Z87891 Personal history of nicotine dependence: Secondary | ICD-10-CM | POA: Diagnosis not present

## 2016-04-03 DIAGNOSIS — M810 Age-related osteoporosis without current pathological fracture: Secondary | ICD-10-CM | POA: Diagnosis not present

## 2016-04-03 DIAGNOSIS — M7551 Bursitis of right shoulder: Secondary | ICD-10-CM | POA: Diagnosis not present

## 2016-04-03 DIAGNOSIS — I10 Essential (primary) hypertension: Secondary | ICD-10-CM | POA: Diagnosis not present

## 2016-04-03 DIAGNOSIS — E1143 Type 2 diabetes mellitus with diabetic autonomic (poly)neuropathy: Secondary | ICD-10-CM | POA: Diagnosis not present

## 2016-04-03 DIAGNOSIS — Z7982 Long term (current) use of aspirin: Secondary | ICD-10-CM | POA: Diagnosis not present

## 2016-04-03 DIAGNOSIS — Z9181 History of falling: Secondary | ICD-10-CM | POA: Diagnosis not present

## 2016-04-03 DIAGNOSIS — M7552 Bursitis of left shoulder: Secondary | ICD-10-CM | POA: Diagnosis not present

## 2016-04-03 DIAGNOSIS — Z7984 Long term (current) use of oral hypoglycemic drugs: Secondary | ICD-10-CM | POA: Diagnosis not present

## 2016-04-03 DIAGNOSIS — E785 Hyperlipidemia, unspecified: Secondary | ICD-10-CM | POA: Diagnosis not present

## 2016-04-03 DIAGNOSIS — H9193 Unspecified hearing loss, bilateral: Secondary | ICD-10-CM | POA: Diagnosis not present

## 2016-04-03 DIAGNOSIS — Z87440 Personal history of urinary (tract) infections: Secondary | ICD-10-CM | POA: Diagnosis not present

## 2016-04-03 DIAGNOSIS — R1310 Dysphagia, unspecified: Secondary | ICD-10-CM | POA: Diagnosis not present

## 2016-04-03 DIAGNOSIS — M1990 Unspecified osteoarthritis, unspecified site: Secondary | ICD-10-CM | POA: Diagnosis not present

## 2016-04-09 DIAGNOSIS — Z7982 Long term (current) use of aspirin: Secondary | ICD-10-CM | POA: Diagnosis not present

## 2016-04-09 DIAGNOSIS — E1143 Type 2 diabetes mellitus with diabetic autonomic (poly)neuropathy: Secondary | ICD-10-CM | POA: Diagnosis not present

## 2016-04-09 DIAGNOSIS — M1990 Unspecified osteoarthritis, unspecified site: Secondary | ICD-10-CM | POA: Diagnosis not present

## 2016-04-09 DIAGNOSIS — M7552 Bursitis of left shoulder: Secondary | ICD-10-CM | POA: Diagnosis not present

## 2016-04-09 DIAGNOSIS — Z9181 History of falling: Secondary | ICD-10-CM | POA: Diagnosis not present

## 2016-04-09 DIAGNOSIS — G232 Striatonigral degeneration: Secondary | ICD-10-CM | POA: Diagnosis not present

## 2016-04-09 DIAGNOSIS — Z87891 Personal history of nicotine dependence: Secondary | ICD-10-CM | POA: Diagnosis not present

## 2016-04-09 DIAGNOSIS — Z87440 Personal history of urinary (tract) infections: Secondary | ICD-10-CM | POA: Diagnosis not present

## 2016-04-09 DIAGNOSIS — Z7984 Long term (current) use of oral hypoglycemic drugs: Secondary | ICD-10-CM | POA: Diagnosis not present

## 2016-04-09 DIAGNOSIS — H9193 Unspecified hearing loss, bilateral: Secondary | ICD-10-CM | POA: Diagnosis not present

## 2016-04-09 DIAGNOSIS — E785 Hyperlipidemia, unspecified: Secondary | ICD-10-CM | POA: Diagnosis not present

## 2016-04-09 DIAGNOSIS — M7551 Bursitis of right shoulder: Secondary | ICD-10-CM | POA: Diagnosis not present

## 2016-04-09 DIAGNOSIS — I10 Essential (primary) hypertension: Secondary | ICD-10-CM | POA: Diagnosis not present

## 2016-04-09 DIAGNOSIS — E039 Hypothyroidism, unspecified: Secondary | ICD-10-CM | POA: Diagnosis not present

## 2016-04-09 DIAGNOSIS — M810 Age-related osteoporosis without current pathological fracture: Secondary | ICD-10-CM | POA: Diagnosis not present

## 2016-04-09 DIAGNOSIS — R1310 Dysphagia, unspecified: Secondary | ICD-10-CM | POA: Diagnosis not present

## 2016-04-10 ENCOUNTER — Telehealth: Payer: Self-pay

## 2016-04-10 NOTE — Telephone Encounter (Signed)
Nicholas PresumeRuben from [facility] called to let provider know that patient missed one physical therapy session this week. Normal sessions will resume next week.

## 2016-04-11 NOTE — Telephone Encounter (Signed)
noted 

## 2016-04-14 ENCOUNTER — Encounter: Payer: Self-pay | Admitting: Internal Medicine

## 2016-04-14 ENCOUNTER — Ambulatory Visit (INDEPENDENT_AMBULATORY_CARE_PROVIDER_SITE_OTHER): Payer: Medicare Other | Admitting: Internal Medicine

## 2016-04-14 ENCOUNTER — Ambulatory Visit (INDEPENDENT_AMBULATORY_CARE_PROVIDER_SITE_OTHER): Payer: Medicare Other

## 2016-04-14 VITALS — BP 142/80 | HR 74 | Temp 97.8°F | Ht 72.0 in | Wt 178.8 lb

## 2016-04-14 DIAGNOSIS — E039 Hypothyroidism, unspecified: Secondary | ICD-10-CM

## 2016-04-14 DIAGNOSIS — Z0000 Encounter for general adult medical examination without abnormal findings: Secondary | ICD-10-CM

## 2016-04-14 DIAGNOSIS — E1143 Type 2 diabetes mellitus with diabetic autonomic (poly)neuropathy: Secondary | ICD-10-CM | POA: Diagnosis not present

## 2016-04-14 DIAGNOSIS — I10 Essential (primary) hypertension: Secondary | ICD-10-CM | POA: Diagnosis not present

## 2016-04-14 DIAGNOSIS — G232 Striatonigral degeneration: Secondary | ICD-10-CM

## 2016-04-14 DIAGNOSIS — E785 Hyperlipidemia, unspecified: Secondary | ICD-10-CM

## 2016-04-14 DIAGNOSIS — R413 Other amnesia: Secondary | ICD-10-CM

## 2016-04-14 DIAGNOSIS — G2581 Restless legs syndrome: Secondary | ICD-10-CM | POA: Diagnosis not present

## 2016-04-14 NOTE — Progress Notes (Signed)
Subjective:   Nicholas Barnett is a 83 y.o. male who presents for Medicare Annual/Subsequent preventive examination.  Review of Systems:   Cardiac Risk Factors include: advanced age (>83men, >49 women);family history of premature cardiovascular disease;male gender;hypertension;sedentary lifestyle;smoking/ tobacco exposure;diabetes mellitus;dyslipidemia     Objective:    Vitals: BP (!) 142/80 (BP Location: Left Arm, Patient Position: Sitting, Cuff Size: Normal)   Pulse 74   Temp 97.8 F (36.6 C) (Oral)   Ht 6' (1.829 m)   Wt 178 lb 12.8 oz (81.1 kg)   SpO2 97%   BMI 24.25 kg/m   Body mass index is 24.25 kg/m.  Tobacco History  Smoking Status  . Former Smoker  . Quit date: 01/31/1974  Smokeless Tobacco  . Never Used     Counseling given: No   Past Medical History:  Diagnosis Date  . Abnormality of gait   . Arthritis   . Bifascicular block   . Cyanocobalamin deficiency   . Depression   . Diverticulitis   . Dizzy   . Dysphagia, idiopathic    History of aspiration  . Gait disturbance   . Hearing loss of both ears   . Hyperlipidemia   . Hypertension   . Lumbar back pain   . Memory loss   . Neck pain   . Osteoarthritis   . Osteoporosis   . Other and unspecified hyperlipidemia   . Parkinsonism (HCC)   . Recurrent falls    Using a cane and walker  . Restless leg   . Restless legs syndrome (RLS)   . Right knee pain   . Shoulder pain   . Thoracic back pain   . Thyroid disease   . Type 2 diabetes mellitus with autonomic neuropathy (HCC)   . Unspecified essential hypertension   . Unspecified hypothyroidism   . Vitamin D deficiency    Past Surgical History:  Procedure Laterality Date  . CATARACT EXTRACTION Bilateral 2012   Dr. Hazle Quant   Family History  Problem Relation Age of Onset  . Heart disease Mother   . Diabetes Mother   . Stroke Mother   . Cancer Father     prostate  . Diabetes Sister   . Heart disease Brother    History  Sexual Activity  .  Sexual activity: No    Outpatient Encounter Prescriptions as of 04/14/2016  Medication Sig  . amLODipine (NORVASC) 5 MG tablet Take 1 tablet (5 mg total) by mouth daily.  Marland Kitchen aspirin EC 81 MG tablet Take 81 mg by mouth daily.  . benazepril (LOTENSIN) 5 MG tablet Take 1 tablet (5 mg total) by mouth daily.  . Calcium Carbonate-Vitamin D (CALCIUM-CARB 600 + D) 600-125 MG-UNIT TABS Take 1 tablet by mouth 2 (two) times daily.   . carbidopa-levodopa (SINEMET IR) 25-100 MG tablet Take 3 tablets by mouth 2 (two) times daily.  . cyanocobalamin (,VITAMIN B-12,) 1000 MCG/ML injection Inject 1 mL (1,000 mcg total) into the muscle every 30 (thirty) days. Pt receives on the 15th of every month.  . gabapentin (NEURONTIN) 300 MG capsule Take 1 capsule (300 mg total) by mouth at bedtime.  Marland Kitchen glipiZIDE (GLUCOTROL XL) 5 MG 24 hr tablet Take 1 tablet (5 mg total) by mouth daily.  Marland Kitchen glucose blood test strip Use to test blood sugar up to three times daily Dx:E11.43  . metFORMIN (GLUCOPHAGE) 1000 MG tablet Take 1 tablet (1,000 mg total) by mouth 2 (two) times daily with a meal.  . mirtazapine (  REMERON) 30 MG tablet Take 1 tablet (30 mg total) by mouth at bedtime.  . pantoprazole (PROTONIX) 40 MG tablet Take 1 tablet (40 mg total) by mouth 2 (two) times daily.  . pravastatin (PRAVACHOL) 20 MG tablet Take 1 tablet (20 mg total) by mouth at bedtime.  . tamsulosin (FLOMAX) 0.4 MG CAPS capsule Take 1 capsule (0.4 mg total) by mouth at bedtime.   No facility-administered encounter medications on file as of 04/14/2016.     Activities of Daily Living In your present state of health, do you have any difficulty performing the following activities: 04/14/2016  Hearing? Y  Vision? N  Difficulty concentrating or making decisions? Y  Walking or climbing stairs? Y  Dressing or bathing? Y  Doing errands, shopping? Y  Preparing Food and eating ? Y  Using the Toilet? N  In the past six months, have you accidently leaked urine? Y    Do you have problems with loss of bowel control? N  Managing your Medications? Y  Managing your Finances? Y  Housekeeping or managing your Housekeeping? Y  Some recent data might be hidden    Patient Care Team: Kimber Relic, MD as PCP - General (Internal Medicine) Vladimir Faster, DO as Consulting Physician (Neurology) Ranee Gosselin, MD as Consulting Physician (Orthopedic Surgery) Nelson Chimes, MD as Consulting Physician (Ophthalmology) Mardella Layman, MD as Consulting Physician (Gastroenterology)   Assessment:    Exercise Activities and Dietary recommendations Current Exercise Habits: The patient does not participate in regular exercise at present (Pt has been taking therapy 2 times per week.), Exercise limited by: neurologic condition(s)  Goals    . <enter goal here>          Starting 04/14/16, I will maintain my current lifestyle.       Fall Risk Fall Risk  04/14/2016 02/25/2016 01/21/2016 10/16/2015 10/03/2015  Falls in the past year? Yes Yes No Yes Yes  Number falls in past yr: 2 or more 2 or more - 2 or more 1  Injury with Fall? No No - No No  Risk Factor Category  - High Fall Risk - - -  Risk for fall due to : History of fall(s);Impaired balance/gait;Impaired mobility - - - -  Follow up Falls prevention discussed Falls evaluation completed - - Falls evaluation completed   Depression Screen PHQ 2/9 Scores 04/14/2016 10/16/2015 12/20/2014 12/20/2014  PHQ - 2 Score 0 0 2 0  PHQ- 9 Score - - 10 -    Cognitive Function MMSE - Mini Mental State Exam 04/14/2016 04/26/2013  Orientation to time 5 4  Orientation to Place 5 5  Registration 3 3  Attention/ Calculation 5 0  Recall 3 2  Language- name 2 objects 2 2  Language- repeat 1 1  Language- follow 3 step command 3 1  Language- read & follow direction 1 0  Language-read & follow direction-comments - unable to due to Parkinsons Disease  Write a sentence 0 0  Write a sentence-comments Unable to write due to Parkinson's.   unable to due to Parkinsons Disease  Copy design 0 0  Copy design-comments Unable to write due to Parkinson's.  unable to due to Parkinsons Disease  Total score 28 18        Immunization History  Administered Date(s) Administered  . Influenza Split 12/08/2011  . Influenza Whole 11/25/2005, 11/24/2006  . Influenza,inj,Quad PF,36+ Mos 11/07/2012  . Pneumococcal Conjugate-13 10/17/2014  . Pneumococcal Polysaccharide-23 12/22/2007  . Td 11/22/2003  Screening Tests Health Maintenance  Topic Date Due  . TETANUS/TDAP  11/21/2013  . OPHTHALMOLOGY EXAM  03/23/2015  . FOOT EXAM  04/19/2015  . HEMOGLOBIN A1C  04/15/2016  . INFLUENZA VACCINE  Completed  . PNA vac Low Risk Adult  Completed      Plan:    I have personally reviewed and addressed the Medicare Annual Wellness questionnaire and have noted the following in the patient's chart:  A. Medical and social history B. Use of alcohol, tobacco or illicit drugs  C. Current medications and supplements D. Functional ability and status E.  Nutritional status F.  Physical activity G. Advance directives H. List of other physicians I.  Hospitalizations, surgeries, and ER visits in previous 12 months J.  Vitals K. Screenings to include hearing, vision, cognitive, depression L. Referrals and appointments - none  In addition, I have reviewed and discussed with patient certain preventive protocols, quality metrics, and best practice recommendations. A written personalized care plan for preventive services as well as general preventive health recommendations were provided to patient.  See attached scanned questionnaire for additional information.   Signed,   Nilda CalamityAlisa McCutcheon, LPN Health Advisor  I have reviewed the information entered by the Health Advisor. I was present in the office during the time of patient interaction and was available for consultation. I agree with the documentation and advice.  Lenon CurtArthur G. Chilton SiGreen, MD

## 2016-04-14 NOTE — Progress Notes (Signed)
Facility  [facility]    Place of Service:   OFFICE    Allergies  Allergen Reactions  . Caffeine Swelling and Other (See Comments)    Reaction:  Joint swelling    Chief Complaint  Patient presents with  . Medical Management of Chronic Issues    6 month follow-up, MMSE complet (28/30, unable to complete clock drawing)   . Medication Refill    no refills needed     HPI:  Essential hypertension - controlled  Parkinson's plus syndrome (HCC) - stable and without change  Hypothyroidism, unspecified type - compensated  Hyperlipidemia, unspecified hyperlipidemia type - controlled  Type 2 diabetes mellitus with autonomic neuropathy, unspecified long term insulin use status (HCC) - controlled  Restless legs syndrome (RLS) - controlled  Memory loss - stable. Actually scored 28/30 on MMSE today.    Medications: Patient's Medications  New Prescriptions   No medications on file  Previous Medications   AMLODIPINE (NORVASC) 5 MG TABLET    Take 1 tablet (5 mg total) by mouth daily.   ASPIRIN EC 81 MG TABLET    Take 81 mg by mouth daily.   BENAZEPRIL (LOTENSIN) 5 MG TABLET    Take 1 tablet (5 mg total) by mouth daily.   CALCIUM CARBONATE-VITAMIN D (CALCIUM-CARB 600 + D) 600-125 MG-UNIT TABS    Take 1 tablet by mouth 2 (two) times daily.    CARBIDOPA-LEVODOPA (SINEMET IR) 25-100 MG TABLET    Take 3 tablets by mouth 2 (two) times daily.   CYANOCOBALAMIN (,VITAMIN B-12,) 1000 MCG/ML INJECTION    Inject 1 mL (1,000 mcg total) into the muscle every 30 (thirty) days. Pt receives on the 15th of every month.   GABAPENTIN (NEURONTIN) 300 MG CAPSULE    Take 1 capsule (300 mg total) by mouth at bedtime.   GLIPIZIDE (GLUCOTROL XL) 5 MG 24 HR TABLET    Take 1 tablet (5 mg total) by mouth daily.   GLUCOSE BLOOD TEST STRIP    Use to test blood sugar up to three times daily Dx:E11.43   METFORMIN (GLUCOPHAGE) 1000 MG TABLET    Take 1 tablet (1,000 mg total) by mouth 2 (two) times daily with a meal.   MIRTAZAPINE (REMERON) 30 MG TABLET    Take 1 tablet (30 mg total) by mouth at bedtime.   PANTOPRAZOLE (PROTONIX) 40 MG TABLET    Take 1 tablet (40 mg total) by mouth 2 (two) times daily.   PRAVASTATIN (PRAVACHOL) 20 MG TABLET    Take 1 tablet (20 mg total) by mouth at bedtime.   TAMSULOSIN (FLOMAX) 0.4 MG CAPS CAPSULE    Take 1 capsule (0.4 mg total) by mouth at bedtime.  Modified Medications   No medications on file  Discontinued Medications   No medications on file    Review of Systems  Constitutional: Positive for appetite change (Loss of appetite) and fatigue.       Regaining weight  HENT: Positive for hearing loss (Wearing hearing aids) and voice change (Voice is weaker.). Negative for ear pain.        Edentulous  Eyes: Negative.   Respiratory: Positive for cough (Related to recurrent aspiration) and shortness of breath.   Cardiovascular: Negative for chest pain, palpitations and leg swelling.  Gastrointestinal: Positive for constipation.       History of diverticulitis. Mild dysphagia. Burns when he lays down at night.  Endocrine:       Diabetic.  Genitourinary:  Weak stream. Occasional urinary incontinence.  Musculoskeletal: Positive for arthralgias, back pain, gait problem, myalgias, neck pain and neck stiffness.       Shoulder pains. Nothing seems to help.  Skin: Negative for pallor, rash and wound.  Allergic/Immunologic: Negative.   Neurological: Positive for dizziness, tremors and weakness (Generalized).       Memory loss. Confusion.  Hematological:       Petechia  Psychiatric/Behavioral: Positive for agitation, dysphoric mood and sleep disturbance. The patient is nervous/anxious.     Vitals:   04/14/16 1333  BP: (!) 142/80  Pulse: 74  Temp: 97.8 F (36.6 C)  TempSrc: Oral  SpO2: 97%  Weight: 178 lb 12.8 oz (81.1 kg)  Height: 6' (1.829 m)   Body mass index is 24.25 kg/m. Wt Readings from Last 3 Encounters:  04/14/16 178 lb 12.8 oz (81.1 kg)    04/14/16 178 lb 12.8 oz (81.1 kg)  02/25/16 186 lb (84.4 kg)      Physical Exam  Constitutional: He is oriented to person, place, and time.  Thin. Fragile. Elderly.  HENT:  Head: Normocephalic and atraumatic.  Right Ear: External ear normal.  Left Ear: External ear normal.  Nose: Nose normal.  Mouth/Throat: Oropharynx is clear and moist.  Loss of hearing. Hearing aids. Edentulous.  Eyes: Conjunctivae and EOM are normal. Pupils are equal, round, and reactive to light.  Neck: No JVD present. No tracheal deviation present. No thyromegaly present.  Cardiovascular: Normal rate, regular rhythm, normal heart sounds and intact distal pulses.  Exam reveals no gallop and no friction rub.   No murmur heard. Pulmonary/Chest: Breath sounds normal. No respiratory distress. He has no wheezes. He has no rales.  Abdominal: He exhibits no distension and no mass. There is no tenderness.  Musculoskeletal: He exhibits no edema or tenderness.  Tender in both shoulders. Mild restriction of movement of the shoulders. Unstable gait. Right knee pains inhibit walking.  Lymphadenopathy:    He has no cervical adenopathy.  Neurological: He is alert and oriented to person, place, and time. He displays abnormal reflex. No cranial nerve deficit. He exhibits normal muscle tone. Coordination normal.  Diminished DTR bilaterally and patella.. 04/14/16 MMSE 28/30. Failed clock drawing due to inability to draw from parkinsonism.  Skin: No rash noted. No erythema. No pallor.  Psychiatric: He has a normal mood and affect. His behavior is normal. Judgment and thought content normal.    Labs reviewed: Lab Summary Latest Ref Rng & Units 01/19/2016 10/14/2015 04/15/2015  Hemoglobin 13.0 - 17.0 g/dL 14.4 (None) (None)  Hematocrit 39.0 - 52.0 % 43.7 (None) (None)  White count 4.0 - 10.5 K/uL 10.7(H) (None) (None)  Platelet count 150 - 400 K/uL 342 (None) (None)  Sodium 135 - 145 mmol/L 140 141 144  Potassium 3.5 - 5.1  mmol/L 4.2 3.7 3.8  Calcium 8.9 - 10.3 mg/dL 9.7 9.5 9.9  Phosphorus - (None) (None) (None)  Creatinine 0.61 - 1.24 mg/dL 1.25(H) 1.23(H) 0.98  AST 15 - 41 U/L '20 14 15  ' Alk Phos 38 - 126 U/L 76 71 86  Bilirubin 0.3 - 1.2 mg/dL 0.6 0.7 0.7  Glucose 65 - 99 mg/dL 91 115(H) 114(H)  Cholesterol 125 - 200 mg/dL (None) 153 (None)  HDL cholesterol >=40 mg/dL (None) 48 (None)  Triglycerides <150 mg/dL (None) 131 (None)  LDL Direct - (None) (None) (None)  LDL Calc <130 mg/dL (None) 79 (None)  Total protein 6.5 - 8.1 g/dL 7.5 6.8 (None)  Albumin 3.5 -  5.0 g/dL 3.9 4.3 4.3  Some recent data might be hidden   Lab Results  Component Value Date   TSH 2.59 10/14/2015   TSH 1.760 04/15/2015   TSH 0.98 05/13/2012   Lab Results  Component Value Date   BUN 28 (H) 01/19/2016   BUN 21 10/14/2015   BUN 17 04/15/2015   Lab Results  Component Value Date   HGBA1C 5.5 10/14/2015   HGBA1C 5.7 (H) 04/15/2015   HGBA1C 6.1 (H) 10/15/2014    Assessment/Plan  1. Essential hypertension The current medical regimen is effective;  continue present plan and medications. - Comprehensive metabolic panel; Future  2. Parkinson's plus syndrome ([place]) The current medical regimen is effective;  continue present plan and medications.  3. Hypothyroidism, unspecified type The current medical regimen is effective;  continue present plan and medications. - TSH; Future  4. Hyperlipidemia, unspecified hyperlipidemia type The current medical regimen is effective;  continue present plan and medications. - Lipid panel; Future  5. Type 2 diabetes mellitus with autonomic neuropathy, unspecified long term insulin use status (HCC) The current medical regimen is effective;  continue present plan and medications. - Hemoglobin A1c; Future - Microalbumin, urine; Future - Urinalysis; Future  6. Restless legs syndrome (RLS) Continue gabapentin  7. Memory loss Continue to monitor.

## 2016-04-14 NOTE — Progress Notes (Signed)
Quick Notes   Health Maintenance:  Due for Foot exam    Abnormal Screen: MMSE 28/30- Unable to write due to Parkinson's.     Patient Concerns:  Pt still has complaints of dry mouth, typically at night when he is sleeping due to his open mouth.     Nurse Concerns:  None  I have reviewed the information entered by the Health Advisor. I was present in the office during the time of patient interaction and was available for consultation. I agree with the documentation and advice.  Lenon CurtArthur G. Chilton SiGreen, MD

## 2016-04-14 NOTE — Patient Instructions (Addendum)
Nicholas Barnett , Thank you for taking time to come for your Medicare Wellness Visit. I appreciate your ongoing commitment to your health goals. Please review the following plan we discussed and let me know if I can assist you in the future.   These are the goals we discussed: Goals    . <enter goal here>          Starting 04/14/16, I will maintain my current lifestyle.        This is a list of the screening recommended for you and due dates:  Health Maintenance  Topic Date Due  . Tetanus Vaccine  11/21/2013  . Eye exam for diabetics  03/23/2015  . Complete foot exam   04/19/2015  . Hemoglobin A1C  04/15/2016  . Flu Shot  Completed  . Pneumonia vaccines  Completed  Preventive Care for Adults  A healthy lifestyle and preventive care can promote health and wellness. Preventive health guidelines for adults include the following key practices.  . A routine yearly physical is a good way to check with your health care provider about your health and preventive screening. It is a chance to share any concerns and updates on your health and to receive a thorough exam.  . Visit your dentist for a routine exam and preventive care every 6 months. Brush your teeth twice a day and floss once a day. Good oral hygiene prevents tooth decay and gum disease.  . The frequency of eye exams is based on your age, health, family medical history, use  of contact lenses, and other factors. Follow your health care provider's ecommendations for frequency of eye exams.  . Eat a healthy diet. Foods like vegetables, fruits, whole grains, low-fat dairy products, and lean protein foods contain the nutrients you need without too many calories. Decrease your intake of foods high in solid fats, added sugars, and salt. Eat the right amount of calories for you. Get information about a proper diet from your health care provider, if necessary.  . Regular physical exercise is one of the most important things you can do for your  health. Most adults should get at least 150 minutes of moderate-intensity exercise (any activity that increases your heart rate and causes you to sweat) each week. In addition, most adults need muscle-strengthening exercises on 2 or more days a week.  Silver Sneakers may be a benefit available to you. To determine eligibility, you may visit the website: www.silversneakers.com or contact program at (930) 219-7259 Mon-Fri between 8AM-8PM.   . Maintain a healthy weight. The body mass index (BMI) is a screening tool to identify possible weight problems. It provides an estimate of body fat based on height and weight. Your health care provider can find your BMI and can help you achieve or maintain a healthy weight.   For adults 20 years and older: ? A BMI below 18.5 is considered underweight. ? A BMI of 18.5 to 24.9 is normal. ? A BMI of 25 to 29.9 is considered overweight. ? A BMI of 30 and above is considered obese.   . Maintain normal blood lipids and cholesterol levels by exercising and minimizing your intake of saturated fat. Eat a balanced diet with plenty of fruit and vegetables. Blood tests for lipids and cholesterol should begin at age 63 and be repeated every 5 years. If your lipid or cholesterol levels are high, you are over 50, or you are at high risk for heart disease, you may need your cholesterol levels checked more  frequently. Ongoing high lipid and cholesterol levels should be treated with medicines if diet and exercise are not working.  . If you smoke, find out from your health care provider how to quit. If you do not use tobacco, please do not start.  . If you choose to drink alcohol, please do not consume more than 2 drinks per day. One drink is considered to be 12 ounces (355 mL) of beer, 5 ounces (148 mL) of wine, or 1.5 ounces (44 mL) of liquor.  . If you are 7655-81 years old, ask your health care provider if you should take aspirin to prevent strokes.  . Use sunscreen. Apply  sunscreen liberally and repeatedly throughout the day. You should seek shade when your shadow is shorter than you. Protect yourself by wearing long sleeves, pants, a wide-brimmed hat, and sunglasses year round, whenever you are outdoors.  . Once a month, do a whole body skin exam, using a mirror to look at the skin on your back. Tell your health care provider of new moles, moles that have irregular borders, moles that are larger than a pencil eraser, or moles that have changed in shape or color.

## 2016-04-15 ENCOUNTER — Telehealth: Payer: Self-pay

## 2016-04-15 NOTE — Telephone Encounter (Signed)
Ok, I guess he's seeing me now.

## 2016-04-15 NOTE — Telephone Encounter (Signed)
Olegario [facility]Kathy with [facility] called to request verbal order for physical therapy 1 week 1 for discharge next week.  [facility], verbal order given. Message will be sent to patient's provider as a FYI.

## 2016-04-21 DIAGNOSIS — Z9181 History of falling: Secondary | ICD-10-CM | POA: Diagnosis not present

## 2016-04-21 DIAGNOSIS — Z87891 Personal history of nicotine dependence: Secondary | ICD-10-CM | POA: Diagnosis not present

## 2016-04-21 DIAGNOSIS — Z7982 Long term (current) use of aspirin: Secondary | ICD-10-CM | POA: Diagnosis not present

## 2016-04-21 DIAGNOSIS — H9193 Unspecified hearing loss, bilateral: Secondary | ICD-10-CM | POA: Diagnosis not present

## 2016-04-21 DIAGNOSIS — I10 Essential (primary) hypertension: Secondary | ICD-10-CM | POA: Diagnosis not present

## 2016-04-21 DIAGNOSIS — E1143 Type 2 diabetes mellitus with diabetic autonomic (poly)neuropathy: Secondary | ICD-10-CM | POA: Diagnosis not present

## 2016-04-21 DIAGNOSIS — Z87440 Personal history of urinary (tract) infections: Secondary | ICD-10-CM | POA: Diagnosis not present

## 2016-04-21 DIAGNOSIS — Z7984 Long term (current) use of oral hypoglycemic drugs: Secondary | ICD-10-CM | POA: Diagnosis not present

## 2016-04-21 DIAGNOSIS — E039 Hypothyroidism, unspecified: Secondary | ICD-10-CM | POA: Diagnosis not present

## 2016-04-21 DIAGNOSIS — M7551 Bursitis of right shoulder: Secondary | ICD-10-CM | POA: Diagnosis not present

## 2016-04-21 DIAGNOSIS — R1310 Dysphagia, unspecified: Secondary | ICD-10-CM | POA: Diagnosis not present

## 2016-04-21 DIAGNOSIS — M7552 Bursitis of left shoulder: Secondary | ICD-10-CM | POA: Diagnosis not present

## 2016-04-21 DIAGNOSIS — M1990 Unspecified osteoarthritis, unspecified site: Secondary | ICD-10-CM | POA: Diagnosis not present

## 2016-04-21 DIAGNOSIS — G232 Striatonigral degeneration: Secondary | ICD-10-CM | POA: Diagnosis not present

## 2016-04-21 DIAGNOSIS — E785 Hyperlipidemia, unspecified: Secondary | ICD-10-CM | POA: Diagnosis not present

## 2016-04-21 DIAGNOSIS — M810 Age-related osteoporosis without current pathological fracture: Secondary | ICD-10-CM | POA: Diagnosis not present

## 2016-04-27 DIAGNOSIS — G20 Parkinson's disease: Secondary | ICD-10-CM | POA: Diagnosis not present

## 2016-05-05 ENCOUNTER — Other Ambulatory Visit: Payer: Self-pay | Admitting: *Deleted

## 2016-05-05 MED ORDER — LANCET DEVICE MISC: 0 refills | Status: DC

## 2016-05-05 NOTE — Telephone Encounter (Signed)
[facility] 

## 2016-05-07 ENCOUNTER — Other Ambulatory Visit: Payer: Self-pay

## 2016-05-07 DIAGNOSIS — E1143 Type 2 diabetes mellitus with diabetic autonomic (poly)neuropathy: Secondary | ICD-10-CM

## 2016-05-07 MED ORDER — LANCET DEVICE MISC: 3 refills | Status: DC

## 2016-05-07 MED ORDER — ONETOUCH DELICA LANCETS FINE MISC: 11 refills | Status: DC

## 2016-05-07 NOTE — Telephone Encounter (Signed)
Patient's wife called requesting rx for lancet device when the patient actually needs lancets. Representative form [employer] called to clarify and request Onetouch Delica lancets, testing once daily     RX submitted electronically

## 2016-05-28 DIAGNOSIS — G20 Parkinson's disease: Secondary | ICD-10-CM | POA: Diagnosis not present

## 2016-06-27 DIAGNOSIS — G20 Parkinson's disease: Secondary | ICD-10-CM | POA: Diagnosis not present

## 2016-07-28 DIAGNOSIS — G20 Parkinson's disease: Secondary | ICD-10-CM | POA: Diagnosis not present

## 2016-08-19 NOTE — Addendum Note (Signed)
Addended by: Josph MachoANCE, KIMBERLY A on: 08/19/2016 02:37 PM   Modules accepted: Orders

## 2016-08-21 ENCOUNTER — Other Ambulatory Visit: Payer: Self-pay | Admitting: Internal Medicine

## 2016-08-27 DIAGNOSIS — G20 Parkinson's disease: Secondary | ICD-10-CM | POA: Diagnosis not present

## 2016-09-02 ENCOUNTER — Telehealth: Payer: Self-pay | Admitting: *Deleted

## 2016-09-02 NOTE — Telephone Encounter (Signed)
Per verbal from Dr. Renato Gailseed, insurance requires pt to have a face to face visit in order to get home health, what services is he asking for? Order can be done for wheelchair at his 10/12/16 visit.  ( also Dr. Renato Gailseed has never seen pt before)

## 2016-09-02 NOTE — Telephone Encounter (Signed)
Betty,wife called and requested Home Care Services to help her with patient. Would like referral placed for [facility].  Also requesting Rx for a Light Weight Wheelchair to help get patient out to appointments and easy for her to put in car. Please Advise.

## 2016-09-03 NOTE — Telephone Encounter (Signed)
Patient wife notified and will wait till appointment.

## 2016-09-18 ENCOUNTER — Other Ambulatory Visit: Payer: Self-pay | Admitting: *Deleted

## 2016-09-18 MED ORDER — PRAVASTATIN SODIUM 20 MG PO TABS: ORAL_TABLET | ORAL | 3 refills | Status: DC

## 2016-09-18 NOTE — Telephone Encounter (Signed)
[facility] 

## 2016-09-23 ENCOUNTER — Other Ambulatory Visit: Payer: Self-pay

## 2016-09-23 DIAGNOSIS — I10 Essential (primary) hypertension: Secondary | ICD-10-CM

## 2016-09-23 DIAGNOSIS — E039 Hypothyroidism, unspecified: Secondary | ICD-10-CM

## 2016-09-23 DIAGNOSIS — E785 Hyperlipidemia, unspecified: Secondary | ICD-10-CM

## 2016-09-23 DIAGNOSIS — R413 Other amnesia: Secondary | ICD-10-CM

## 2016-09-23 DIAGNOSIS — E1143 Type 2 diabetes mellitus with diabetic autonomic (poly)neuropathy: Secondary | ICD-10-CM

## 2016-09-23 DIAGNOSIS — G20C Parkinsonism, unspecified: Secondary | ICD-10-CM

## 2016-09-23 DIAGNOSIS — G232 Striatonigral degeneration: Secondary | ICD-10-CM

## 2016-09-23 DIAGNOSIS — G2581 Restless legs syndrome: Secondary | ICD-10-CM

## 2016-09-24 DIAGNOSIS — Z961 Presence of intraocular lens: Secondary | ICD-10-CM | POA: Diagnosis not present

## 2016-09-24 DIAGNOSIS — H04123 Dry eye syndrome of bilateral lacrimal glands: Secondary | ICD-10-CM | POA: Diagnosis not present

## 2016-09-24 DIAGNOSIS — H524 Presbyopia: Secondary | ICD-10-CM | POA: Diagnosis not present

## 2016-09-27 DIAGNOSIS — G20 Parkinson's disease: Secondary | ICD-10-CM | POA: Diagnosis not present

## 2016-10-20 ENCOUNTER — Other Ambulatory Visit: Payer: Medicare Other

## 2016-10-20 ENCOUNTER — Other Ambulatory Visit: Payer: Self-pay | Admitting: Internal Medicine

## 2016-10-20 DIAGNOSIS — R413 Other amnesia: Secondary | ICD-10-CM

## 2016-10-20 DIAGNOSIS — E785 Hyperlipidemia, unspecified: Secondary | ICD-10-CM | POA: Diagnosis not present

## 2016-10-20 DIAGNOSIS — G2581 Restless legs syndrome: Secondary | ICD-10-CM | POA: Diagnosis not present

## 2016-10-20 DIAGNOSIS — E039 Hypothyroidism, unspecified: Secondary | ICD-10-CM

## 2016-10-20 DIAGNOSIS — I10 Essential (primary) hypertension: Secondary | ICD-10-CM

## 2016-10-20 DIAGNOSIS — E1143 Type 2 diabetes mellitus with diabetic autonomic (poly)neuropathy: Secondary | ICD-10-CM | POA: Diagnosis not present

## 2016-10-20 DIAGNOSIS — G20C Parkinsonism, unspecified: Secondary | ICD-10-CM

## 2016-10-20 DIAGNOSIS — G232 Striatonigral degeneration: Secondary | ICD-10-CM | POA: Diagnosis not present

## 2016-10-21 LAB — LIPID PANEL
CHOL/HDL RATIO: 3 ratio (ref <5.0)
Cholesterol: 154 mg/dL (ref <200)
HDL: 51 mg/dL (ref >40)
LDL CALC: 83 mg/dL (ref <100)
TRIGLYCERIDES: 99 mg/dL (ref <150)
VLDL: 20 mg/dL (ref <30)

## 2016-10-21 LAB — COMPLETE METABOLIC PANEL WITH GFR
ALT: 13 U/L (ref 9–46)
AST: 13 U/L (ref 10–35)
Albumin: 4.3 g/dL (ref 3.6–5.1)
Alkaline Phosphatase: 78 U/L (ref 40–115)
BILIRUBIN TOTAL: 0.6 mg/dL (ref 0.2–1.2)
BUN: 24 mg/dL (ref 7–25)
CHLORIDE: 101 mmol/L (ref 98–110)
CO2: 24 mmol/L (ref 20–32)
CREATININE: 1.13 mg/dL — AB (ref 0.70–1.11)
Calcium: 9.8 mg/dL (ref 8.6–10.3)
GFR, EST AFRICAN AMERICAN: 69 mL/min (ref >=60)
GFR, Est Non African American: 60 mL/min (ref >=60)
GLUCOSE: 95 mg/dL (ref 65–99)
Potassium: 4 mmol/L (ref 3.5–5.3)
SODIUM: 141 mmol/L (ref 135–146)
TOTAL PROTEIN: 6.8 g/dL (ref 6.1–8.1)

## 2016-10-21 LAB — MICROALBUMIN / CREATININE URINE RATIO
CREATININE, URINE: 178 mg/dL (ref 20–370)
Microalb Creat Ratio: 17 mcg/mg creat (ref <30)
Microalb, Ur: 3.1 mg/dL

## 2016-10-21 LAB — HEMOGLOBIN A1C
HEMOGLOBIN A1C: 5.2 % (ref <5.7)
Mean Plasma Glucose: 103 mg/dL

## 2016-10-21 LAB — URINALYSIS
BILIRUBIN URINE: NEGATIVE
Glucose, UA: NEGATIVE
Hgb urine dipstick: NEGATIVE
Ketones, ur: NEGATIVE
NITRITE: POSITIVE — AB
PH: 5.5 (ref 5.0–8.0)
Protein, ur: NEGATIVE
SPECIFIC GRAVITY, URINE: 1.028 (ref 1.001–1.035)

## 2016-10-21 LAB — TSH: TSH: 2.7 mIU/L (ref 0.40–4.50)

## 2016-10-22 ENCOUNTER — Encounter: Payer: Self-pay | Admitting: Internal Medicine

## 2016-10-22 ENCOUNTER — Ambulatory Visit (INDEPENDENT_AMBULATORY_CARE_PROVIDER_SITE_OTHER): Payer: Medicare Other | Admitting: Internal Medicine

## 2016-10-22 VITALS — BP 118/62 | HR 83 | Temp 97.9°F | Ht 72.0 in | Wt 173.0 lb

## 2016-10-22 DIAGNOSIS — R350 Frequency of micturition: Secondary | ICD-10-CM | POA: Diagnosis not present

## 2016-10-22 DIAGNOSIS — T17908A Unspecified foreign body in respiratory tract, part unspecified causing other injury, initial encounter: Secondary | ICD-10-CM | POA: Diagnosis not present

## 2016-10-22 DIAGNOSIS — E538 Deficiency of other specified B group vitamins: Secondary | ICD-10-CM

## 2016-10-22 DIAGNOSIS — I10 Essential (primary) hypertension: Secondary | ICD-10-CM

## 2016-10-22 DIAGNOSIS — G214 Vascular parkinsonism: Secondary | ICD-10-CM

## 2016-10-22 DIAGNOSIS — E785 Hyperlipidemia, unspecified: Secondary | ICD-10-CM | POA: Diagnosis not present

## 2016-10-22 DIAGNOSIS — E1143 Type 2 diabetes mellitus with diabetic autonomic (poly)neuropathy: Secondary | ICD-10-CM

## 2016-10-22 DIAGNOSIS — N401 Enlarged prostate with lower urinary tract symptoms: Secondary | ICD-10-CM | POA: Diagnosis not present

## 2016-10-22 DIAGNOSIS — F332 Major depressive disorder, recurrent severe without psychotic features: Secondary | ICD-10-CM | POA: Diagnosis not present

## 2016-10-22 DIAGNOSIS — R1310 Dysphagia, unspecified: Secondary | ICD-10-CM | POA: Diagnosis not present

## 2016-10-22 MED ORDER — ZOSTER VAC RECOMB ADJUVANTED 50 MCG/0.5ML IM SUSR: 0.5000 mL | Freq: Once | INTRAMUSCULAR | 1 refills | Status: AC

## 2016-10-22 MED ORDER — AMOXICILLIN-POT CLAVULANATE 875-125 MG PO TABS: 1.0000 | ORAL_TABLET | Freq: Two times a day (BID) | ORAL | 0 refills | Status: DC

## 2016-10-22 MED ORDER — TETANUS-DIPHTH-ACELL PERTUSSIS 5-2.5-18.5 LF-MCG/0.5 IM SUSP: 0.5000 mL | Freq: Once | INTRAMUSCULAR | 0 refills | Status: AC

## 2016-10-22 NOTE — Progress Notes (Signed)
Location:  [facility] clinic Provider:  Saba Neuman L. Renato Gails, D.O., C.M.D.  Goals of Care:  Advanced Directives 10/22/2016  Does Patient Have a Medical Advance Directive? Yes  Type of Estate agent of [place];Living will  Does patient want to make changes to medical advance directive? -  Copy of Healthcare Power of Attorney in Chart? Yes   Chief Complaint  Patient presents with  . Medical Management of Chronic Issues    6 month follow-up, new to me from Dr. Chilton Si, DM foot exam due, and discuss labs (copy printed). Patient c/o productive cough (discolored- green)   . Medication Refill    Requesting RX for ointment to use on scrotum- mupirocin 2 %  . Immunizations    TDaP and shingrix rx printed, patient will wait for flu vaccine until Oct/Nov 2018  . Orders    Request for wheelchair for transportation transfer, leg rest. Patient's children thinks patient needs homehealth yet patient does not think so and will refuse if someone comes out (FYI)     HPI: Patient is a 83 y.o. Barnett seen today for medical management of chronic diseases--pt's first visit with me after Dr. Thomasene Lot retirement.  28/30 on mmse in feb for AWV, but 18/30 in 2015.    Parkinson's plus syndrome:  On sinemet 3 tabs twice a day.   Also has memory loss.  Sees Dr. Arbutus Leas from neurology.  His wife can tell his tremors get worse w/o it.  Tremors They are requesting a wheelchair to do transfers b/c it's a challenge.  He's been falling.  Last fall was on the front porch less than 2 mos ago.  Does have hospital bed with trapeze bar.  Has difficulty pulling himself up.  His wife has to pull him up.  Uses electric scooter.  They use the desk chair to move him to the bathroom from the bed.  He gets up out of the chair.  He sometimes manages to get up on his own and get ready like when his wife was at church.  Pt sleeps 12 hrs or at least is in bed that long.  Has a lift chair in the kitchen at a location where he can watch  tv.  He is not wanting the ramp that has been suggested.    He used to work out in a shop doing Programmer, applications.  BPH with LUTS:  On flomax.  Uses depends and pads.  Is incontinent of urine more than he's not.  Using chucks often.    DMII with neuropathy:  On glipizide, pravachol, metformin, baby asa.  Checks sugars each morning.  Doing well.  Stays around 90.  Does not have a good appetite.  Takes glucernas one per day.  Drinks crystal lite to sip on.    Hypothyroidism?  TSH normal since 2013 as far as I can tell so will remove from diagnoses.  Dysphagia:  Coughs a lot.  Has trouble with lettuce--pieces will stick.  Has dry mouth interventions. Has had choking episodes even last night.  Productive cough of green sputum.  Happening for months.  It's gotten more common. He aspirates into his lungs and has had two tests for this. He was told the thickener would not help.  He felt like he had a fever Sunday night.  Responds to tylenol and a cold washcloth.  HTN:  On benazepril, amlodipine.  Fluctuates.  Sometimes dizzy but not on standing, he says.  Has had bilateral shoulder bursitis. Has right knee  OA, had cortisone injections, flexogenix, and still has pain    Major depression:  On remeron high dose.    Hyperlipidemia:  On pravachol.   Cholesterol at goal.    B12 deficiency:  On supplement--gets monthly injection at home.   Past Medical History:  Diagnosis Date  . Abnormality of gait   . Arthritis   . Bifascicular block   . Cyanocobalamin deficiency   . Depression   . Diverticulitis   . Dizzy   . Dysphagia, idiopathic    History of aspiration  . Gait disturbance   . Hearing loss of both ears   . Hyperlipidemia   . Hypertension   . Lumbar back pain   . Memory loss   . Neck pain   . Osteoarthritis   . Osteoporosis   . Other and unspecified hyperlipidemia   . Parkinsonism (HCC)   . Recurrent falls    Using a cane and walker  . Restless leg   . Restless legs syndrome (RLS)   .  Right knee pain   . Shoulder pain   . Thoracic back pain   . Thyroid disease   . Type 2 diabetes mellitus with autonomic neuropathy (HCC)   . Unspecified essential hypertension   . Unspecified hypothyroidism   . Vitamin D deficiency     Past Surgical History:  Procedure Laterality Date  . CATARACT EXTRACTION Bilateral 2012   Dr. Hazle Quantigby    Allergies  Allergen Reactions  . Caffeine Swelling and Other (See Comments)    Reaction:  Joint swelling    Allergies as of 10/22/2016      Reactions   Caffeine Swelling, Other (See Comments)   Reaction:  Joint swelling      Medication List       Accurate as of 10/22/16 10:54 AM. Always use your most recent med list.          amLODipine 5 MG tablet Commonly known as:  NORVASC Take 1 tablet (5 mg total) by mouth daily.   aspirin EC 81 MG tablet Take 81 mg by mouth daily.   benazepril 5 MG tablet Commonly known as:  LOTENSIN Take 1 tablet (5 mg total) by mouth daily.   CALCIUM-CARB 600 + D 600-125 MG-UNIT Tabs Generic drug:  Calcium Carbonate-Vitamin D Take 1 tablet by mouth 2 (two) times daily.   carbidopa-levodopa 25-100 MG tablet Commonly known as:  SINEMET IR Take 3 tablets by mouth 2 (two) times daily.   cyanocobalamin 1000 MCG/ML injection Commonly known as:  (VITAMIN B-12) Inject 1 mL (1,000 mcg total) into the muscle every 30 (thirty) days. Pt receives on the 15th of every month.   gabapentin 300 MG capsule Commonly known as:  NEURONTIN Take 1 capsule (300 mg total) by mouth at bedtime.   glipiZIDE 5 MG 24 hr tablet Commonly known as:  GLUCOTROL XL Take 1 tablet (5 mg total) by mouth daily.   glucose blood test strip Use to test blood sugar up to three times daily Dx:E11.43   Lancet Device Misc One Touch Delica Lancet Device Use as Directed in testing blood sugar Dx:E11.9   metFORMIN 1000 MG tablet Commonly known as:  GLUCOPHAGE TAKE 1 TABLET BY MOUTH TWO  TIMES DAILY WITH A MEAL   mirtazapine 30 MG  tablet Commonly known as:  REMERON Take 1 tablet (30 mg total) by mouth at bedtime.   ONETOUCH DELICA LANCETS FINE Misc Check blood sugar once daily DX E11.43   pantoprazole 40 MG  tablet Commonly known as:  PROTONIX TAKE 1 TABLET BY MOUTH TWO  TIMES DAILY   pravastatin 20 MG tablet Commonly known as:  PRAVACHOL Take one tablet by mouth at bedtime   tamsulosin 0.4 MG Caps capsule Commonly known as:  FLOMAX Take 1 capsule (0.4 mg total) by mouth at bedtime.   Tdap 5-2.5-18.5 LF-MCG/0.5 injection Commonly known as:  BOOSTRIX Inject 0.5 mLs into the muscle once.   Zoster Vac Recomb Adjuvanted injection Commonly known as:  SHINGRIX Inject 0.5 mLs into the muscle once.            Discharge Care Instructions        Start     Ordered   10/22/16 0000  Tdap (BOOSTRIX) 5-2.5-18.5 LF-MCG/0.5 injection   Once     10/22/16 1034   10/22/16 0000  Zoster Vac Recomb Adjuvanted [facility]) injection   Once     10/22/16 1053      Review of Systems:  Review of Systems  Constitutional: Positive for chills, malaise/fatigue and weight loss. Negative for fever.  HENT: Positive for hearing loss. Negative for congestion.        Hearing aids  Eyes: Negative for blurred vision.       Glasses  Respiratory: Positive for cough, hemoptysis and sputum production. Negative for shortness of breath and wheezing.   Cardiovascular: Negative for chest pain, palpitations and leg swelling.  Gastrointestinal: Negative for abdominal pain, blood in stool, constipation and melena.  Genitourinary: Negative for dysuria.  Musculoskeletal: Positive for falls and joint pain.       Bilateral shoulder pain, right knee pain  Skin: Negative for itching and rash.  Neurological: Positive for dizziness, tremors and weakness. Negative for loss of consciousness.  Psychiatric/Behavioral: Positive for depression and memory loss. The patient is not nervous/anxious and does not have insomnia.     Health Maintenance    Topic Date Due  . OPHTHALMOLOGY EXAM  03/23/2015  . FOOT EXAM  04/19/2015  . INFLUENZA VACCINE  01/22/2017 (Originally 09/23/2016)  . TETANUS/TDAP  02/23/2017 (Originally 11/21/2013)  . HEMOGLOBIN A1C  04/22/2017  . PNA vac Low Risk Adult  Completed    Physical Exam: Vitals:   10/22/16 1021  BP: 118/62  Pulse: 83  Temp: 97.9 F (36.6 C)  TempSrc: Oral  SpO2: 95%  Weight: 173 lb (78.5 kg)  Height: 6' (1.829 m)   Body mass index is 23.46 kg/m. Physical Exam  Constitutional: He is oriented to person, place, and time. No distress.  HENT:  Head: Normocephalic and atraumatic.  Hearing aids  Eyes:  glasses  Cardiovascular: Normal rate, regular rhythm, normal heart sounds and intact distal pulses.   Pulmonary/Chest: Effort normal and breath sounds normal. No respiratory distress. He has no wheezes.  Abdominal: Soft. Bowel sounds are normal. He exhibits no distension. There is no tenderness.  Musculoskeletal: He exhibits tenderness.  Bilateral shoulders, decreased ROM, also tender over right knee; came in using our transport wheelchair today  Neurological: He is alert and oriented to person, place, and time.  Poor historian, his wife corrects him during visit  Skin: Skin is warm and dry.  Psychiatric:  Flat affect    Labs reviewed: Basic Metabolic Panel:  Recent Labs  56/38/Nicholas 1952 10/20/16 0955  NA 140 141  K 4.2 4.0  CL 104 101  CO2 25 24  GLUCOSE 91 95  BUN 28* 24  CREATININE 1.25* 1.13*  CALCIUM 9.7 9.8  TSH  --  2.70   Liver  Function Tests:  Recent Labs  01/19/16 1954 10/20/16 0955  AST 20 13  ALT 5* 13  ALKPHOS 76 78  BILITOT 0.6 0.6  PROT 7.5 6.8  ALBUMIN 3.9 4.3   No results for input(s): LIPASE, AMYLASE in the last 8760 hours. No results for input(s): AMMONIA in the last 8760 hours. CBC:  Recent Labs  01/19/16 1954  WBC 10.7*  NEUTROABS 7.5  HGB 14.4  HCT 43.7  MCV 94.2  PLT 342   Lipid Panel:  Recent Labs  10/20/16 0955  CHOL  154  HDL 51  LDLCALC 83  TRIG 99  CHOLHDL 3.0   Lab Results  Component Value Date   HGBA1C 5.2 10/20/2016    Assessment/Plan 1. Vascular parkinsonism (HCC) -cont sinemet as per neurology -pt does not notice a difference (still very unsteady and has difficulty moving feet, but wife notes tremor improves with use) -mobility worsening and his wife requested an Rx for a transport chair which was given  2. Type 2 diabetes mellitus with diabetic autonomic neuropathy, without long-term current use of insulin (HCC) -well controlled, cont current regimen  3. Dysphagia, idiopathic -going on for sometime, due to Parkinsonism, cont aspiration precautions, not felt to benefit from thickened liquids -seems he's recently aspirated and has been coughing up discolored sputum so will tx with abx  4. Essential hypertension -bp well controlled on current meds  No osteoporosis can be found in his chart.  6. Benign prostatic hyperplasia with urinary frequency -incontinent more than not per his wife, uses depends  7. Severe episode of recurrent major depressive disorder, without psychotic features (HCC) -stable on remeron  8. Cyanocobalamin deficiency -continues on b12 injections at home  9. Hyperlipidemia, unspecified hyperlipidemia type -stable on pravachol, cont same  10. Aspiration into respiratory tract, initial encounter - amoxicillin-clavulanate (AUGMENTIN) 875-125 MG tablet; Take 1 tablet by mouth 2 (two) times daily.  Dispense: 20 tablet; Refill: 0  Labs/tests ordered:  No orders of the defined types were placed in this encounter.  Next appt:  04/19/2017--flu shots needed in Oct (refused this early)   Ronell Boldin L. Reynalda Canny, D.O. Geriatrics [facility] [address], [place] [address] Cell Phone (Mon-Fri 8am-5pm):  [phone] On Call:  [phone] & follow prompts after 5pm & weekends Office Phone:  [phone] Office Fax:   [phone]

## 2016-10-22 NOTE — Patient Instructions (Addendum)
Stop glipizide  

## 2016-10-24 LAB — URINE CULTURE

## 2016-10-28 ENCOUNTER — Other Ambulatory Visit: Payer: Self-pay

## 2016-10-28 ENCOUNTER — Telehealth: Payer: Self-pay | Admitting: *Deleted

## 2016-10-28 DIAGNOSIS — G20 Parkinson's disease: Secondary | ICD-10-CM | POA: Diagnosis not present

## 2016-10-28 DIAGNOSIS — T17908A Unspecified foreign body in respiratory tract, part unspecified causing other injury, initial encounter: Secondary | ICD-10-CM

## 2016-10-28 MED ORDER — AMOXICILLIN-POT CLAVULANATE 875-125 MG PO TABS: 1.0000 | ORAL_TABLET | Freq: Two times a day (BID) | ORAL | 0 refills | Status: DC

## 2016-10-28 MED ORDER — MUPIROCIN 2 % EX OINT: 1.0000 "application " | TOPICAL_OINTMENT | Freq: Every day | CUTANEOUS | 0 refills | Status: DC

## 2016-10-28 NOTE — Telephone Encounter (Signed)
-----   Message from Kermit Baloiffany L Reed, DO sent at 10/28/2016 11:23 AM EDT ----- Ok to refill scrotum cream.  I didn't address that at the appt.

## 2016-10-28 NOTE — Telephone Encounter (Signed)
Bactroban sent to pharmacy.

## 2016-10-28 NOTE — Telephone Encounter (Signed)
Yes, I responded to another note about this earlier.

## 2016-10-28 NOTE — Telephone Encounter (Signed)
Wife, [name] called and stated that at patient's last appointment on 10/22/16 they requested a refill on Mupirocin Ointment to use on scrotum but medication not called in, Request is in OV note dated 10/22/16. Not in current medication list. Is this ok to add and send to pharmacy. Please Advise.

## 2016-10-29 NOTE — Progress Notes (Signed)
ERROR    This encounter was created in error - please disregard.

## 2016-11-05 ENCOUNTER — Other Ambulatory Visit: Payer: Self-pay | Admitting: Internal Medicine

## 2016-11-27 DIAGNOSIS — G20 Parkinson's disease: Secondary | ICD-10-CM | POA: Diagnosis not present

## 2016-12-04 ENCOUNTER — Other Ambulatory Visit: Payer: Self-pay | Admitting: Internal Medicine

## 2016-12-08 ENCOUNTER — Ambulatory Visit (INDEPENDENT_AMBULATORY_CARE_PROVIDER_SITE_OTHER): Payer: Medicare Other | Admitting: Family Medicine

## 2016-12-08 ENCOUNTER — Telehealth: Payer: Self-pay | Admitting: *Deleted

## 2016-12-08 ENCOUNTER — Ambulatory Visit (INDEPENDENT_AMBULATORY_CARE_PROVIDER_SITE_OTHER): Payer: Medicare Other

## 2016-12-08 ENCOUNTER — Encounter: Payer: Self-pay | Admitting: Family Medicine

## 2016-12-08 VITALS — BP 102/62 | HR 85 | Temp 97.6°F | Resp 16

## 2016-12-08 DIAGNOSIS — G20A1 Parkinson's disease without dyskinesia, without mention of fluctuations: Secondary | ICD-10-CM

## 2016-12-08 DIAGNOSIS — R404 Transient alteration of awareness: Secondary | ICD-10-CM

## 2016-12-08 DIAGNOSIS — G20 Parkinson's disease: Secondary | ICD-10-CM

## 2016-12-08 DIAGNOSIS — H538 Other visual disturbances: Secondary | ICD-10-CM

## 2016-12-08 DIAGNOSIS — R05 Cough: Secondary | ICD-10-CM | POA: Diagnosis not present

## 2016-12-08 DIAGNOSIS — Z87440 Personal history of urinary (tract) infections: Secondary | ICD-10-CM

## 2016-12-08 DIAGNOSIS — Z23 Encounter for immunization: Secondary | ICD-10-CM | POA: Diagnosis not present

## 2016-12-08 LAB — POCT URINALYSIS DIP (MANUAL ENTRY)
Blood, UA: NEGATIVE
GLUCOSE UA: NEGATIVE mg/dL
Nitrite, UA: NEGATIVE
SPEC GRAV UA: 1.02 (ref 1.010–1.025)
UROBILINOGEN UA: 0.2 U/dL
pH, UA: 5.5 (ref 5.0–8.0)

## 2016-12-08 LAB — POCT CBC
Granulocyte percent: 70.8 %G (ref 37–80)
HEMATOCRIT: 44.1 % (ref 43.5–53.7)
HEMOGLOBIN: 14.9 g/dL (ref 14.1–18.1)
Lymph, poc: 2.1 (ref 0.6–3.4)
MCH, POC: 30.6 pg (ref 27–31.2)
MCHC: 33.8 g/dL (ref 31.8–35.4)
MCV: 90.6 fL (ref 80–97)
MID (CBC): 0.3 (ref 0–0.9)
MPV: 7.6 fL (ref 0–99.8)
POC Granulocyte: 5.8 (ref 2–6.9)
POC LYMPH PERCENT: 25.6 %L (ref 10–50)
POC MID %: 3.6 % (ref 0–12)
Platelet Count, POC: 252 10*3/uL (ref 142–424)
RBC: 4.86 M/uL (ref 4.69–6.13)
RDW, POC: 14 %
WBC: 8.2 10*3/uL (ref 4.6–10.2)

## 2016-12-08 LAB — POC MICROSCOPIC URINALYSIS (UMFC): Mucus: ABSENT

## 2016-12-08 LAB — GLUCOSE, POCT (MANUAL RESULT ENTRY): POC Glucose: 167 mg/dl — AB (ref 70–99)

## 2016-12-08 NOTE — Patient Instructions (Addendum)
No new treatments today. If he gets worse take him to the emergency room and/or call his neurologist.  Will try to let you know the results of the additional labs.  Try to get the urinalysis when you can.    IF you received an x-ray today, you will receive an invoice from [facility]. Please contact [facility] at [phone] with questions or concerns regarding your invoice.   IF you received labwork today, you will receive an invoice from [place]. Please contact [facility] at [phone] with questions or concerns regarding your invoice.   Our billing staff will not be able to assist you with questions regarding bills from these companies.  You will be contacted with the lab results as soon as they are available. The fastest way to get your results is to activate your My Chart account. Instructions are located on the last page of this paperwork. If you have not heard from [place] regarding the results in 2 weeks, please contact this office.

## 2016-12-08 NOTE — Progress Notes (Signed)
Patient ID: Nicholas Barnett, male    DOB: May 21, 1931  Age: 83 y.o. MRN: [id number]  Chief Complaint  Patient presents with  . Dizziness    with some blurred vision.   . Altered Mental Status    Subjective:   Elderly gentleman with parkinsonism that I'm seeing for the first time. He was apparently fairly normal on Sunday. Monday he got up and felt like his vision was blurry. He couldn't read this crawling of things on the television or computer. He was a little bit confused and couldn't remember the buttons on the remote.he has parkinsonism. Has a new primary care doctor since Dr. Chilton Si retired. He has a regular neurologist that he just sees on a when necessary basis. His wife and daughter accompanying him here today. He had a urinary tract infection a month ago. He has had a chronic cough that they're concerned about. He takes his medicines faithfully. He checks his blood pressure and blood sugar regularly at home and these been good.  Current allergies, medications, problem list, past/family and social histories reviewed.  Objective:  BP 102/62   Pulse 85   Temp 97.6 F (36.4 C) (Oral)   Resp 16   SpO2 97%   No major distress. Alert and fairly oriented. Has parkinsonism. A little bit sluggish mentation but apparently fairly normal day for him. His eyes are both fairly pinpoint I could not see the fundi. TMs normal. Throat clear. Cranial nerves II-12 grossly intact. Chest clear. Heart regular without murmur. Abdomen soft without masses or tenderness. No CVA tenderness.  Assessment & Plan:   Assessment: 1. Need for prophylactic vaccination and inoculation against influenza   2. Transient alteration of awareness   3. History of UTI   4. Parkinson disease (HCC)   5. Blurred vision, bilateral       Plan: Check labs. They may not be able to get a urinalysis while he is here but I told that they can bring her back if necessary. Results for orders placed or performed in visit on 12/08/16   POCT CBC  Result Value Ref Range   WBC 8.2 4.6 - 10.2 K/uL   Lymph, poc 2.1 0.6 - 3.4   POC LYMPH PERCENT 25.6 10 - 50 %L   MID (cbc) 0.3 0 - 0.9   POC MID % 3.6 0 - 12 %M   POC Granulocyte 5.8 2 - 6.9   Granulocyte percent 70.8 37 - 80 %G   RBC 4.86 4.69 - 6.13 M/uL   Hemoglobin 14.9 14.1 - 18.1 g/dL   HCT, POC 78.4 69.6 - 53.7 %   MCV 90.6 80 - 97 fL   MCH, POC 30.6 27 - 31.2 pg   MCHC 33.8 31.8 - 35.4 g/dL   RDW, POC 29.5 %   Platelet Count, POC 252 142 - 424 K/uL   MPV 7.6 0 - 99.8 fL  POCT glucose (manual entry)  Result Value Ref Range   POC Glucose 167 (A) 70 - 99 mg/dl  ' Orders Placed This Encounter  Procedures  . DG Chest 2 View    Standing Status:   Future    Number of Occurrences:   1    Standing Expiration Date:   12/08/2017    Order Specific Question:   Reason for Exam (SYMPTOM  OR DIAGNOSIS REQUIRED)    Answer:   increasing chronic cough; parkinson's    Order Specific Question:   Preferred imaging location?    Answer:  External  . Flu Vaccine QUAD 36+ mos IM  . Comprehensive metabolic panel  . POCT CBC  . POCT glucose (manual entry)  . POCT Microscopic Urinalysis ([facility])  . POCT urinalysis dipstick   Chest x-ray was normal. No orders of the defined types were placed in this encounter.        Patient Instructions   No new treatments today. If he gets worse take him to the emergency room and/or call his neurologist.  Will try to let you know the results of the additional labs.  Try to get the urinalysis when you can.    IF you received an x-ray today, you will receive an invoice from [facility]. Please contact [facility] at [phone] with questions or concerns regarding your invoice.   IF you received labwork today, you will receive an invoice from [place]. Please contact [facility] at [phone] with questions or concerns regarding your invoice.   Our billing staff will not be able to assist you with questions  regarding bills from these companies.  You will be contacted with the lab results as soon as they are available. The fastest way to get your results is to activate your My Chart account. Instructions are located on the last page of this paperwork. If you have not heard from [place] regarding the results in 2 weeks, please contact this office.         Return if symptoms worsen or fail to improve.   Ravon Mortellaro,[place], MD 12/08/2016

## 2016-12-08 NOTE — Telephone Encounter (Signed)
Patient wife, Nicholas Barnett called yesterday during power outage and left message on Clinical Intake line stating patient was very confused and she was thinking he had a stroke and wanted to know what to do.   I tried calling patient's wife back today and had to [facility] to return call.

## 2016-12-09 ENCOUNTER — Other Ambulatory Visit: Payer: Self-pay | Admitting: Family Medicine

## 2016-12-09 LAB — COMPREHENSIVE METABOLIC PANEL
A/G RATIO: 1.7 (ref 1.2–2.2)
ALBUMIN: 4.2 g/dL (ref 3.5–4.7)
ALK PHOS: 90 IU/L (ref 39–117)
ALT: 6 IU/L (ref 0–44)
AST: 14 IU/L (ref 0–40)
BUN / CREAT RATIO: 21 (ref 10–24)
BUN: 28 mg/dL — ABNORMAL HIGH (ref 8–27)
Bilirubin Total: 0.4 mg/dL (ref 0.0–1.2)
CO2: 23 mmol/L (ref 20–29)
CREATININE: 1.34 mg/dL — AB (ref 0.76–1.27)
Calcium: 9.8 mg/dL (ref 8.6–10.2)
Chloride: 101 mmol/L (ref 96–106)
GFR calc Af Amer: 56 mL/min/{1.73_m2} — ABNORMAL LOW (ref >59)
GFR calc non Af Amer: 49 mL/min/{1.73_m2} — ABNORMAL LOW (ref >59)
GLOBULIN, TOTAL: 2.5 g/dL (ref 1.5–4.5)
Glucose: 167 mg/dL — ABNORMAL HIGH (ref 65–99)
Potassium: 4 mmol/L (ref 3.5–5.2)
SODIUM: 142 mmol/L (ref 134–144)
Total Protein: 6.7 g/dL (ref 6.0–8.5)

## 2016-12-09 MED ORDER — CEPHALEXIN 500 MG PO CAPS: 500.0000 mg | ORAL_CAPSULE | Freq: Two times a day (BID) | ORAL | 0 refills | Status: DC

## 2016-12-28 ENCOUNTER — Ambulatory Visit (INDEPENDENT_AMBULATORY_CARE_PROVIDER_SITE_OTHER): Payer: Medicare Other | Admitting: Nurse Practitioner

## 2016-12-28 ENCOUNTER — Encounter: Payer: Self-pay | Admitting: Nurse Practitioner

## 2016-12-28 VITALS — BP 118/78 | HR 86 | Temp 98.2°F | Resp 18 | Ht 72.0 in | Wt 169.0 lb

## 2016-12-28 DIAGNOSIS — K5900 Constipation, unspecified: Secondary | ICD-10-CM | POA: Diagnosis not present

## 2016-12-28 DIAGNOSIS — N390 Urinary tract infection, site not specified: Secondary | ICD-10-CM

## 2016-12-28 NOTE — Patient Instructions (Addendum)
Make sure you are drinking enough fluids 6-8 8 oz glasses of fluids a day.  Can use Miralax 17 gm daily for constipation- mixed with 6 oz of fluids   Constipation, Adult Constipation is when a person has fewer bowel movements in a week than normal, has difficulty having a bowel movement, or has stools that are dry, hard, or larger than normal. Constipation may be caused by an underlying condition. It may become worse with age if a person takes certain medicines and does not take in enough fluids. Follow these instructions at home: Eating and drinking   Eat foods that have a lot of fiber, such as fresh fruits and vegetables, whole grains, and beans.  Limit foods that are high in fat, low in fiber, or overly processed, such as french fries, hamburgers, cookies, candies, and soda.  Drink enough fluid to keep your urine clear or pale yellow. General instructions  Exercise regularly or as told by your health care provider.  Go to the restroom when you have the urge to go. Do not hold it in.  Take over-the-counter and prescription medicines only as told by your health care provider. These include any fiber supplements.  Practice pelvic floor retraining exercises, such as deep breathing while relaxing the lower abdomen and pelvic floor relaxation during bowel movements.  Watch your condition for any changes.  Keep all follow-up visits as told by your health care provider. This is important. Contact a health care provider if:  You have pain that gets worse.  You have a fever.  You do not have a bowel movement after 4 days.  You vomit.  You are not hungry.  You lose weight.  You are bleeding from the anus.  You have thin, pencil-like stools. Get help right away if:  You have a fever and your symptoms suddenly get worse.  You leak stool or have blood in your stool.  Your abdomen is bloated.  You have severe pain in your abdomen.  You feel dizzy or you faint. This  information is not intended to replace advice given to you by your health care provider. Make sure you discuss any questions you have with your health care provider. Document Released: 11/08/2003 Document Revised: 08/30/2015 Document Reviewed: 07/31/2015 Elsevier Interactive Patient Education  2017 [employer]

## 2016-12-28 NOTE — Progress Notes (Signed)
Careteam: Patient Care Team: Kermit Baloeed, Tiffany L, DO as PCP - General (Geriatric Medicine) Tat, Octaviano Battyebecca S, DO as Consulting Physician (Neurology) Ranee GosselinGioffre, Ronald, MD as Consulting Physician (Orthopedic Surgery) Nelson Chimesigby, Donald, MD as Consulting Physician (Ophthalmology) Mardella LaymanPatterson, Cuyler R, MD as Consulting Physician (Gastroenterology)  Advanced Directive information Does Patient Have a Medical Advance Directive?: Yes, Type of Advance Directive: Healthcare Power of [place];Living will, Does patient want to make changes to medical advance directive?: No - Patient declined  Allergies  Allergen Reactions  . Caffeine Swelling and Other (See Comments)    Reaction:  Joint swelling    Chief Complaint  Patient presents with  . Follow-up    Pt is being seen for an urgent care follow up. Pt was [facility]16 due to altered mental status and UTI.   . Other    Wife in room with pt.      HPI: Patient is a 83 y.o. male seen in the office today to follow up from urgent care. Pt was seen on 10/16 at urgent care due to confusion and blurred vision.  See neurologist due to parkinsonism on an as needed basis. Urine was sent and resulted to our office, he had greater than 100000 colonies of ecoli and was placed on Keflex.  He has completed keflex at this time.  No recurrent confusion or blurred vision- stated this has resolved before he even got to the urgent care but his wife made him go still. Also had dizziness but this had improved.  Tolerated keflex without side effects.  Wife states he does not drink enough water.  He is incontinent of urine and wears depends.   Review of Systems:  Review of Systems  Constitutional: Negative for chills, fever and weight loss.  HENT: Negative for tinnitus.   Eyes: Negative for blurred vision.  Respiratory: Negative for cough, sputum production and shortness of breath.   Cardiovascular: Negative for chest pain, palpitations and leg  swelling.  Gastrointestinal: Positive for constipation (uses prunes for this. ). Negative for diarrhea and heartburn.  Genitourinary: Negative for dysuria, frequency and urgency.       Incontinence.  Musculoskeletal: Negative for back pain, falls, joint pain and myalgias.  Skin: Negative.   Neurological: Positive for weakness. Negative for dizziness and headaches.  Psychiatric/Behavioral: Negative for depression and memory loss. The patient does not have insomnia.     Past Medical History:  Diagnosis Date  . Abnormality of gait   . Arthritis   . Bifascicular block   . Cyanocobalamin deficiency   . Depression   . Diverticulitis   . Dizzy   . Dysphagia, idiopathic    History of aspiration  . Gait disturbance   . Hearing loss of both ears   . Hyperlipidemia   . Hypertension   . Lumbar back pain   . Memory loss   . Neck pain   . Osteoarthritis   . Osteoporosis   . Other and unspecified hyperlipidemia   . Parkinsonism (HCC)   . Recurrent falls    Using a cane and walker  . Restless leg   . Restless legs syndrome (RLS)   . Right knee pain   . Shoulder pain   . Thoracic back pain   . Thyroid disease   . Type 2 diabetes mellitus with autonomic neuropathy (HCC)   . Unspecified essential hypertension   . Unspecified hypothyroidism   . Vitamin D deficiency    Past Surgical History:  Procedure Laterality Date  . CATARACT EXTRACTION Bilateral 2012   Dr. Hazle Quant   Social History:   reports that he quit smoking about 42 years ago. he has never used smokeless tobacco. He reports that he does not drink alcohol or use drugs.  Family History  Problem Relation Age of Onset  . Heart disease Mother   . Diabetes Mother   . Stroke Mother   . Cancer Father        prostate  . Diabetes Sister   . Heart disease Brother     Medications:   Medication List        Accurate as of 12/28/16  3:24 PM. Always use your most recent med list.          amLODipine 5 MG tablet Commonly  known as:  NORVASC Take 1 tablet (5 mg total) by mouth daily.   aspirin EC 81 MG tablet   benazepril 5 MG tablet Commonly known as:  LOTENSIN Take 1 tablet (5 mg total) by mouth daily.   CALCIUM-CARB 600 + D 600-125 MG-UNIT Tabs Generic drug:  Calcium Carbonate-Vitamin D   carbidopa-levodopa 25-100 MG tablet Commonly known as:  SINEMET IR Take 3 tablets by mouth 2 (two) times daily.   cyanocobalamin 1000 MCG/ML injection Commonly known as:  (VITAMIN B-12) Inject 1 mL (1,000 mcg total) into the muscle every 30 (thirty) days. Pt receives on the 15th of every month.   gabapentin 300 MG capsule Commonly known as:  NEURONTIN Take 1 capsule (300 mg total) by mouth at bedtime.   glucose blood test strip Use to test blood sugar up to three times daily Dx:E11.43   Lancet Device Misc One Touch [employer] Device Use as Directed in testing blood sugar Dx:E11.9   metFORMIN 1000 MG tablet Commonly known as:  GLUCOPHAGE TAKE 1 TABLET BY MOUTH TWO  TIMES DAILY WITH A MEAL   mirtazapine 30 MG tablet Commonly known as:  REMERON TAKE 1 TABLET BY MOUTH AT  BEDTIME   mupirocin ointment 2 % Commonly known as:  BACTROBAN APPLY AT BEDTIME TO LESION ON SCROTUM   ONETOUCH DELICA LANCETS FINE Misc Check blood sugar once daily DX E11.43   pantoprazole 40 MG tablet Commonly known as:  PROTONIX TAKE 1 TABLET BY MOUTH TWO  TIMES DAILY   pravastatin 20 MG tablet Commonly known as:  PRAVACHOL Take one tablet by mouth at bedtime   tamsulosin 0.4 MG Caps capsule Commonly known as:  FLOMAX Take 1 capsule (0.4 mg total) by mouth at bedtime.        Physical Exam:  Vitals:   12/28/16 1456  BP: 118/78  Pulse: 86  Resp: 18  Temp: 98.2 F (36.8 C)  TempSrc: Oral  SpO2: 97%  Weight: 169 lb (76.7 kg)  Height: 6' (1.829 m)   Body mass index is 22.92 kg/m.  Physical Exam  Constitutional: He is oriented to person, place, and time. No distress.  HENT:  Head: Normocephalic and  atraumatic.  Hearing aids  Eyes:  glasses  Cardiovascular: Normal rate, regular rhythm and normal heart sounds.  Pulmonary/Chest: Effort normal and breath sounds normal. No respiratory distress. He has no wheezes.  Abdominal: Soft. Bowel sounds are normal. He exhibits no distension. There is no tenderness.  Musculoskeletal:  Uses wheelchair   Neurological: He is alert and oriented to person, place, and time.  Poor historian, his wife corrects him during visit  Skin: Skin is warm and dry.  Psychiatric:  Flat affect  Labs reviewed: Basic Metabolic Panel: Recent Labs    01/19/16 1954 10/20/16 0955 12/08/16 1216  NA 140 141 142  K 4.2 4.0 4.0  CL 104 101 101  CO2 25 24 23   GLUCOSE 91 95 167*  BUN 28* 24 28*  CREATININE 1.25* 1.13* 1.34*  CALCIUM 9.7 9.8 9.8  TSH  --  2.70  --    Liver Function Tests: Recent Labs    01/19/16 1954 10/20/16 0955 12/08/16 1216  AST 20 13 14   ALT 5* 13 6  ALKPHOS 76 78 90  BILITOT 0.6 0.6 0.4  PROT 7.5 6.8 6.7  ALBUMIN 3.9 4.3 4.2   No results for input(s): LIPASE, AMYLASE in the last 8760 hours. No results for input(s): AMMONIA in the last 8760 hours. CBC: Recent Labs    01/19/16 1954 12/08/16 1207  WBC 10.7* 8.2  NEUTROABS 7.5  --   HGB 14.4 14.9  HCT 43.7 44.1  MCV 94.2 90.6  PLT 342  --    Lipid Panel: Recent Labs    10/20/16 0955  CHOL 154  HDL 51  LDLCALC 83  TRIG 99  CHOLHDL 3.0   TSH: Recent Labs    10/20/16 0955  TSH 2.70   A1C: Lab Results  Component Value Date   HGBA1C 5.2 10/20/2016     Assessment/Plan 1. Constipation, unspecified constipation type -encouraged proper hydration, may use miralax daily to help  -other nutritional information given  2. Urinary tract infection without hematuria, site unspecified Has completed keflex, no further symptoms noted.  To increase hydration -educated provided on not wearing a wet brief for extended periods of time.   Next appt: as scheduled with Dr  Renato Gails, sooner if needed [place] K. Biagio Borg  [facility] [phone] 8 am - 5 pm) [phone] (after hours)

## 2016-12-29 ENCOUNTER — Telehealth: Payer: Self-pay

## 2016-12-29 NOTE — Telephone Encounter (Signed)
I called patient to confirm that he requested DM test strips from [facility]. Patient's wife states she has never heard of this pharmacy and patient gets diabetic testing supplies from [facility]  Note made on incoming fax for patient to be removed from soliciting list and faxed back to [facility]

## 2017-01-06 ENCOUNTER — Other Ambulatory Visit: Payer: Self-pay | Admitting: Internal Medicine

## 2017-01-22 ENCOUNTER — Other Ambulatory Visit: Payer: Self-pay

## 2017-01-22 ENCOUNTER — Encounter (HOSPITAL_COMMUNITY): Payer: Self-pay

## 2017-01-22 ENCOUNTER — Emergency Department (HOSPITAL_COMMUNITY): Payer: Medicare Other

## 2017-01-22 ENCOUNTER — Inpatient Hospital Stay (HOSPITAL_COMMUNITY)
Admission: EM | Admit: 2017-01-22 | Discharge: 2017-01-27 | DRG: 871 | Disposition: A | Discharge status: Skilled Nursing Facility | Payer: Medicare Other | Attending: Nephrology | Admitting: Nephrology

## 2017-01-22 DIAGNOSIS — K5792 Diverticulitis of intestine, part unspecified, without perforation or abscess without bleeding: Secondary | ICD-10-CM | POA: Diagnosis present

## 2017-01-22 DIAGNOSIS — E039 Hypothyroidism, unspecified: Secondary | ICD-10-CM | POA: Diagnosis not present

## 2017-01-22 DIAGNOSIS — R509 Fever, unspecified: Secondary | ICD-10-CM

## 2017-01-22 DIAGNOSIS — I452 Bifascicular block: Secondary | ICD-10-CM | POA: Diagnosis not present

## 2017-01-22 DIAGNOSIS — G8929 Other chronic pain: Secondary | ICD-10-CM | POA: Diagnosis present

## 2017-01-22 DIAGNOSIS — Z515 Encounter for palliative care: Secondary | ICD-10-CM | POA: Diagnosis not present

## 2017-01-22 DIAGNOSIS — D631 Anemia in chronic kidney disease: Secondary | ICD-10-CM | POA: Diagnosis present

## 2017-01-22 DIAGNOSIS — F329 Major depressive disorder, single episode, unspecified: Secondary | ICD-10-CM | POA: Diagnosis present

## 2017-01-22 DIAGNOSIS — R1310 Dysphagia, unspecified: Secondary | ICD-10-CM | POA: Diagnosis not present

## 2017-01-22 DIAGNOSIS — R0602 Shortness of breath: Secondary | ICD-10-CM | POA: Diagnosis not present

## 2017-01-22 DIAGNOSIS — N179 Acute kidney failure, unspecified: Secondary | ICD-10-CM | POA: Diagnosis present

## 2017-01-22 DIAGNOSIS — Z7984 Long term (current) use of oral hypoglycemic drugs: Secondary | ICD-10-CM

## 2017-01-22 DIAGNOSIS — M810 Age-related osteoporosis without current pathological fracture: Secondary | ICD-10-CM | POA: Diagnosis not present

## 2017-01-22 DIAGNOSIS — N182 Chronic kidney disease, stage 2 (mild): Secondary | ICD-10-CM | POA: Diagnosis present

## 2017-01-22 DIAGNOSIS — E1143 Type 2 diabetes mellitus with diabetic autonomic (poly)neuropathy: Secondary | ICD-10-CM | POA: Diagnosis not present

## 2017-01-22 DIAGNOSIS — M6281 Muscle weakness (generalized): Secondary | ICD-10-CM | POA: Diagnosis not present

## 2017-01-22 DIAGNOSIS — Z7189 Other specified counseling: Secondary | ICD-10-CM

## 2017-01-22 DIAGNOSIS — J9601 Acute respiratory failure with hypoxia: Secondary | ICD-10-CM | POA: Diagnosis present

## 2017-01-22 DIAGNOSIS — I10 Essential (primary) hypertension: Secondary | ICD-10-CM | POA: Diagnosis present

## 2017-01-22 DIAGNOSIS — H9193 Unspecified hearing loss, bilateral: Secondary | ICD-10-CM | POA: Diagnosis present

## 2017-01-22 DIAGNOSIS — R296 Repeated falls: Secondary | ICD-10-CM | POA: Diagnosis present

## 2017-01-22 DIAGNOSIS — A419 Sepsis, unspecified organism: Principal | ICD-10-CM | POA: Diagnosis present

## 2017-01-22 DIAGNOSIS — E1122 Type 2 diabetes mellitus with diabetic chronic kidney disease: Secondary | ICD-10-CM | POA: Diagnosis not present

## 2017-01-22 DIAGNOSIS — E119 Type 2 diabetes mellitus without complications: Secondary | ICD-10-CM | POA: Diagnosis present

## 2017-01-22 DIAGNOSIS — E785 Hyperlipidemia, unspecified: Secondary | ICD-10-CM | POA: Diagnosis not present

## 2017-01-22 DIAGNOSIS — E11649 Type 2 diabetes mellitus with hypoglycemia without coma: Secondary | ICD-10-CM | POA: Diagnosis not present

## 2017-01-22 DIAGNOSIS — R410 Disorientation, unspecified: Secondary | ICD-10-CM | POA: Diagnosis not present

## 2017-01-22 DIAGNOSIS — R2689 Other abnormalities of gait and mobility: Secondary | ICD-10-CM | POA: Diagnosis not present

## 2017-01-22 DIAGNOSIS — G20 Parkinson's disease: Secondary | ICD-10-CM | POA: Diagnosis present

## 2017-01-22 DIAGNOSIS — R6520 Severe sepsis without septic shock: Secondary | ICD-10-CM | POA: Diagnosis not present

## 2017-01-22 DIAGNOSIS — R1312 Dysphagia, oropharyngeal phase: Secondary | ICD-10-CM | POA: Diagnosis not present

## 2017-01-22 DIAGNOSIS — R0902 Hypoxemia: Secondary | ICD-10-CM | POA: Diagnosis not present

## 2017-01-22 DIAGNOSIS — E876 Hypokalemia: Secondary | ICD-10-CM | POA: Diagnosis not present

## 2017-01-22 DIAGNOSIS — I129 Hypertensive chronic kidney disease with stage 1 through stage 4 chronic kidney disease, or unspecified chronic kidney disease: Secondary | ICD-10-CM | POA: Diagnosis not present

## 2017-01-22 DIAGNOSIS — R278 Other lack of coordination: Secondary | ICD-10-CM | POA: Diagnosis not present

## 2017-01-22 DIAGNOSIS — J9600 Acute respiratory failure, unspecified whether with hypoxia or hypercapnia: Secondary | ICD-10-CM | POA: Diagnosis not present

## 2017-01-22 DIAGNOSIS — J690 Pneumonitis due to inhalation of food and vomit: Secondary | ICD-10-CM | POA: Diagnosis present

## 2017-01-22 DIAGNOSIS — G232 Striatonigral degeneration: Secondary | ICD-10-CM

## 2017-01-22 DIAGNOSIS — M545 Low back pain: Secondary | ICD-10-CM | POA: Diagnosis not present

## 2017-01-22 DIAGNOSIS — G2581 Restless legs syndrome: Secondary | ICD-10-CM | POA: Diagnosis present

## 2017-01-22 DIAGNOSIS — Z66 Do not resuscitate: Secondary | ICD-10-CM | POA: Diagnosis present

## 2017-01-22 DIAGNOSIS — Z87891 Personal history of nicotine dependence: Secondary | ICD-10-CM

## 2017-01-22 DIAGNOSIS — K219 Gastro-esophageal reflux disease without esophagitis: Secondary | ICD-10-CM | POA: Diagnosis present

## 2017-01-22 DIAGNOSIS — Z7982 Long term (current) use of aspirin: Secondary | ICD-10-CM

## 2017-01-22 DIAGNOSIS — I9589 Other hypotension: Secondary | ICD-10-CM | POA: Diagnosis not present

## 2017-01-22 DIAGNOSIS — F0390 Unspecified dementia without behavioral disturbance: Secondary | ICD-10-CM | POA: Diagnosis present

## 2017-01-22 DIAGNOSIS — M25561 Pain in right knee: Secondary | ICD-10-CM | POA: Diagnosis not present

## 2017-01-22 DIAGNOSIS — R531 Weakness: Secondary | ICD-10-CM | POA: Diagnosis not present

## 2017-01-22 DIAGNOSIS — E860 Dehydration: Secondary | ICD-10-CM | POA: Diagnosis present

## 2017-01-22 DIAGNOSIS — Z79899 Other long term (current) drug therapy: Secondary | ICD-10-CM

## 2017-01-22 LAB — URINALYSIS, ROUTINE W REFLEX MICROSCOPIC
BILIRUBIN URINE: NEGATIVE
Glucose, UA: NEGATIVE mg/dL
Ketones, ur: NEGATIVE mg/dL
NITRITE: NEGATIVE
PH: 5 (ref 5.0–8.0)
Protein, ur: NEGATIVE mg/dL

## 2017-01-22 LAB — CBC WITH DIFFERENTIAL/PLATELET
BASOS ABS: 0 10*3/uL (ref 0.0–0.1)
BASOS PCT: 0 %
EOS PCT: 0 %
Eosinophils Absolute: 0 10*3/uL (ref 0.0–0.7)
HCT: 45.3 % (ref 39.0–52.0)
Hemoglobin: 15.5 g/dL (ref 13.0–17.0)
Lymphocytes Relative: 7 %
Lymphs Abs: 1.1 10*3/uL (ref 0.7–4.0)
MCH: 32.2 pg (ref 26.0–34.0)
MCHC: 34.2 g/dL (ref 30.0–36.0)
MCV: 94.2 fL (ref 78.0–100.0)
MONO ABS: 0.6 10*3/uL (ref 0.1–1.0)
Monocytes Relative: 4 %
Neutro Abs: 13.2 10*3/uL — ABNORMAL HIGH (ref 1.7–7.7)
Neutrophils Relative %: 89 %
PLATELETS: 206 10*3/uL (ref 150–400)
RBC: 4.81 MIL/uL (ref 4.22–5.81)
RDW: 14.2 % (ref 11.5–15.5)
WBC: 14.9 10*3/uL — ABNORMAL HIGH (ref 4.0–10.5)

## 2017-01-22 LAB — COMPREHENSIVE METABOLIC PANEL
ALK PHOS: 84 U/L (ref 38–126)
ALT: 34 U/L (ref 17–63)
AST: 83 U/L — AB (ref 15–41)
Albumin: 3.9 g/dL (ref 3.5–5.0)
Anion gap: 8 (ref 5–15)
BILIRUBIN TOTAL: 1.3 mg/dL — AB (ref 0.3–1.2)
BUN: 34 mg/dL — AB (ref 6–20)
CO2: 28 mmol/L (ref 22–32)
CREATININE: 1.81 mg/dL — AB (ref 0.61–1.24)
Calcium: 9.4 mg/dL (ref 8.9–10.3)
Chloride: 102 mmol/L (ref 101–111)
GFR calc Af Amer: 38 mL/min — ABNORMAL LOW (ref >60)
GFR, EST NON AFRICAN AMERICAN: 33 mL/min — AB (ref >60)
Glucose, Bld: 156 mg/dL — ABNORMAL HIGH (ref 65–99)
Potassium: 3.8 mmol/L (ref 3.5–5.1)
Sodium: 138 mmol/L (ref 135–145)
TOTAL PROTEIN: 7.1 g/dL (ref 6.5–8.1)

## 2017-01-22 LAB — LACTIC ACID, PLASMA: Lactic Acid, Venous: 3.2 mmol/L (ref 0.5–1.9)

## 2017-01-22 LAB — I-STAT CG4 LACTIC ACID, ED: LACTIC ACID, VENOUS: 1.23 mmol/L (ref 0.5–1.9)

## 2017-01-22 LAB — BRAIN NATRIURETIC PEPTIDE: B Natriuretic Peptide: 216.2 pg/mL — ABNORMAL HIGH (ref 0.0–100.0)

## 2017-01-22 LAB — LIPASE, BLOOD: LIPASE: 21 U/L (ref 11–51)

## 2017-01-22 MED ORDER — IOPAMIDOL (ISOVUE-370) INJECTION 76%
INTRAVENOUS | Status: AC
  Administered 2017-01-22: 70 mL
  Filled 2017-01-22: qty 100

## 2017-01-22 MED ORDER — ASPIRIN EC 81 MG PO TBEC
81.0000 mg | DELAYED_RELEASE_TABLET | Freq: Every day | ORAL | Status: DC
  Administered 2017-01-24 – 2017-01-27 (×4): 81 mg via ORAL
  Filled 2017-01-22 (×4): qty 1

## 2017-01-22 MED ORDER — MIRTAZAPINE 15 MG PO TABS
30.0000 mg | ORAL_TABLET | Freq: Every day | ORAL | Status: DC
  Administered 2017-01-24 – 2017-01-26 (×3): 30 mg via ORAL
  Filled 2017-01-22 (×3): qty 2

## 2017-01-22 MED ORDER — AMLODIPINE BESYLATE 5 MG PO TABS
5.0000 mg | ORAL_TABLET | Freq: Every day | ORAL | Status: DC
  Administered 2017-01-24 – 2017-01-27 (×4): 5 mg via ORAL
  Filled 2017-01-22 (×4): qty 1

## 2017-01-22 MED ORDER — SODIUM CHLORIDE 0.9 % IV BOLUS (SEPSIS)
2000.0000 mL | Freq: Once | INTRAVENOUS | Status: AC
  Administered 2017-01-22: 2000 mL via INTRAVENOUS

## 2017-01-22 MED ORDER — DM-GUAIFENESIN ER 30-600 MG PO TB12
1.0000 | ORAL_TABLET | Freq: Two times a day (BID) | ORAL | Status: DC
  Administered 2017-01-24 – 2017-01-27 (×7): 1 via ORAL
  Filled 2017-01-22 (×8): qty 1

## 2017-01-22 MED ORDER — PANTOPRAZOLE SODIUM 40 MG PO TBEC: 40.0000 mg | DELAYED_RELEASE_TABLET | Freq: Two times a day (BID) | ORAL | Status: DC

## 2017-01-22 MED ORDER — VANCOMYCIN HCL IN DEXTROSE 1-5 GM/200ML-% IV SOLN
1000.0000 mg | Freq: Once | INTRAVENOUS | Status: AC
  Administered 2017-01-22: 1000 mg via INTRAVENOUS
  Filled 2017-01-22: qty 200

## 2017-01-22 MED ORDER — SODIUM CHLORIDE 0.9 % IV SOLN
INTRAVENOUS | Status: DC
  Administered 2017-01-23 – 2017-01-24 (×3): via INTRAVENOUS

## 2017-01-22 MED ORDER — ZOLPIDEM TARTRATE 5 MG PO TABS: 5.0000 mg | ORAL_TABLET | Freq: Every evening | ORAL | Status: DC | PRN

## 2017-01-22 MED ORDER — ENOXAPARIN SODIUM 40 MG/0.4ML ~~LOC~~ SOLN
40.0000 mg | SUBCUTANEOUS | Status: DC
  Administered 2017-01-23 – 2017-01-27 (×5): 40 mg via SUBCUTANEOUS
  Filled 2017-01-22 (×5): qty 0.4

## 2017-01-22 MED ORDER — CALCIUM CARBONATE-VITAMIN D 500-200 MG-UNIT PO TABS
1.0000 | ORAL_TABLET | Freq: Two times a day (BID) | ORAL | Status: DC
  Administered 2017-01-24 – 2017-01-27 (×7): 1 via ORAL
  Filled 2017-01-22 (×8): qty 1

## 2017-01-22 MED ORDER — TAMSULOSIN HCL 0.4 MG PO CAPS
0.4000 mg | ORAL_CAPSULE | Freq: Every day | ORAL | Status: DC
  Administered 2017-01-24 – 2017-01-26 (×3): 0.4 mg via ORAL
  Filled 2017-01-22 (×3): qty 1

## 2017-01-22 MED ORDER — MUPIROCIN 2 % EX OINT
TOPICAL_OINTMENT | Freq: Every day | CUTANEOUS | Status: DC
  Administered 2017-01-23 – 2017-01-27 (×4): via TOPICAL
  Filled 2017-01-22 (×2): qty 22

## 2017-01-22 MED ORDER — PIPERACILLIN-TAZOBACTAM 3.375 G IVPB 30 MIN
3.3750 g | Freq: Once | INTRAVENOUS | Status: AC
  Administered 2017-01-22: 3.375 g via INTRAVENOUS
  Filled 2017-01-22: qty 50

## 2017-01-22 MED ORDER — ACETAMINOPHEN 650 MG RE SUPP: 650.0000 mg | Freq: Four times a day (QID) | RECTAL | Status: DC | PRN

## 2017-01-22 MED ORDER — GABAPENTIN 300 MG PO CAPS
300.0000 mg | ORAL_CAPSULE | Freq: Every day | ORAL | Status: DC
Start: 2017-01-23 — End: 2017-01-27
  Administered 2017-01-24 – 2017-01-26 (×3): 300 mg via ORAL
  Filled 2017-01-22 (×3): qty 1

## 2017-01-22 MED ORDER — ALBUTEROL SULFATE (2.5 MG/3ML) 0.083% IN NEBU: 2.5000 mg | INHALATION_SOLUTION | RESPIRATORY_TRACT | Status: DC | PRN

## 2017-01-22 MED ORDER — ONDANSETRON HCL 4 MG/2ML IJ SOLN: 4.0000 mg | Freq: Three times a day (TID) | INTRAMUSCULAR | Status: DC | PRN

## 2017-01-22 MED ORDER — CARBIDOPA-LEVODOPA 25-100 MG PO TABS
3.0000 | ORAL_TABLET | ORAL | Status: DC
  Administered 2017-01-24 – 2017-01-27 (×6): 3 via ORAL
  Filled 2017-01-22 (×6): qty 3

## 2017-01-22 MED ORDER — CYANOCOBALAMIN 1000 MCG/ML IJ SOLN: 1000.0000 ug | INTRAMUSCULAR | Status: DC

## 2017-01-22 MED ORDER — HYDRALAZINE HCL 20 MG/ML IJ SOLN
5.0000 mg | INTRAMUSCULAR | Status: DC | PRN
  Administered 2017-01-27: 5 mg via INTRAVENOUS
  Filled 2017-01-22 (×2): qty 1

## 2017-01-22 MED ORDER — INSULIN ASPART 100 UNIT/ML ~~LOC~~ SOLN
0.0000 [IU] | Freq: Three times a day (TID) | SUBCUTANEOUS | Status: DC
  Administered 2017-01-24 – 2017-01-25 (×2): 1 [IU] via SUBCUTANEOUS
  Administered 2017-01-25 – 2017-01-27 (×4): 2 [IU] via SUBCUTANEOUS

## 2017-01-22 MED ORDER — PRAVASTATIN SODIUM 20 MG PO TABS
20.0000 mg | ORAL_TABLET | Freq: Every day | ORAL | Status: DC
  Administered 2017-01-24 – 2017-01-26 (×3): 20 mg via ORAL
  Filled 2017-01-22 (×3): qty 1

## 2017-01-22 MED ORDER — INSULIN ASPART 100 UNIT/ML ~~LOC~~ SOLN: 0.0000 [IU] | Freq: Every day | SUBCUTANEOUS | Status: DC

## 2017-01-22 MED ORDER — SODIUM CHLORIDE 0.9 % IV BOLUS (SEPSIS)
500.0000 mL | Freq: Once | INTRAVENOUS | Status: AC
  Administered 2017-01-22: 500 mL via INTRAVENOUS

## 2017-01-22 MED ORDER — SODIUM CHLORIDE 0.9 % IV SOLN
INTRAVENOUS | Status: DC
  Administered 2017-01-22: 20:00:00 via INTRAVENOUS

## 2017-01-22 NOTE — ED Provider Notes (Signed)
Nicholas Barnett Provider Note   CSN: [id number] Arrival date & time: 01/22/17  1604     History   Chief Complaint Chief Complaint  Patient presents with  . Weakness    HPI Nicholas Barnett is a 83 y.o. male.  Patient with a history of Parkinson's has a component of some dementia difficulty swallowing known to develop aspirations.  Patient with fever and chills documented at home.  Also increased weakness.  Not acting quite himself.  Patient arrived here afebrile blood pressure was fine patient respiratory rate as high as 27.  Patient off of oxygen sats down to 88%.  EMS put him on 2 L of oxygen with that he sats in the low 90s.  No hypotension.  Patient with a cough      Past Medical History:  Diagnosis Date  . Abnormality of gait   . Arthritis   . Bifascicular block   . Cyanocobalamin deficiency   . Depression   . Diverticulitis   . Dizzy   . Dysphagia, idiopathic    History of aspiration  . Gait disturbance   . Hearing loss of both ears   . Hyperlipidemia   . Hypertension   . Lumbar back pain   . Memory loss   . Neck pain   . Osteoarthritis   . Osteoporosis   . Other and unspecified hyperlipidemia   . Parkinsonism (HCC)   . Recurrent falls    Using a cane and walker  . Restless leg   . Restless legs syndrome (RLS)   . Right knee pain   . Shoulder pain   . Thoracic back pain   . Thyroid disease   . Type 2 diabetes mellitus with autonomic neuropathy (HCC)   . Unspecified essential hypertension   . Unspecified hypothyroidism   . Vitamin D deficiency     Patient Active Problem List   Diagnosis Date Noted  . Fever, unspecified 10/16/2015  . Depression, major 10/16/2015  . GERD (gastroesophageal reflux disease) 04/17/2015  . UTI (urinary tract infection) 10/17/2014  . Pain in the chest   . Chest pain 07/28/2014  . Abnormal EKG 07/28/2014  . Insomnia 10/17/2013  . Pain in joint, shoulder region 01/25/2013  . Dysphagia,  idiopathic   . Recurrent falls   . Dizzy   . Hearing loss of both ears   . Neck pain   . Right knee pain   . Thoracic back pain   . Lumbar back pain   . Shoulder pain   . Cyanocobalamin deficiency   . Type 2 diabetes mellitus with autonomic neuropathy (HCC)   . Restless legs syndrome (RLS)   . Parkinson's plus syndrome (HCC) 11/24/2012  . Gait difficulty 11/24/2012  . NOCTURIA 05/08/2009  . Memory loss 04/11/2009  . ECZEMA 03/21/2009  . BPH (benign prostatic hyperplasia) 12/25/2008  . OSTEOARTHRITIS, KNEE, RIGHT 12/25/2008  . VITAMIN D DEFICIENCY 12/22/2007  . DIVERTICULITIS, HX OF 06/01/2007  . Hyperlipemia 12/16/2006  . ERECTILE DYSFUNCTION 12/16/2006  . Essential hypertension 12/16/2006    Past Surgical History:  Procedure Laterality Date  . CATARACT EXTRACTION Bilateral 2012   Dr. Hazle Quantigby       Home Medications    Prior to Admission medications   Medication Sig Start Date End Date Taking? Authorizing Provider  acetaminophen (TYLENOL) 500 MG tablet Take 1,000 mg by mouth every 6 (six) hours as needed for moderate pain or fever.   Yes [provider]  amLODipine (NORVASC) 5  MG tablet Take 1 tablet (5 mg total) by mouth daily. 03/10/16  Yes Kimber Relic, MD  aspirin EC 81 MG tablet Take 81 mg by mouth daily.   Yes [provider]  benazepril (LOTENSIN) 5 MG tablet Take 1 tablet (5 mg total) by mouth daily. 03/10/16  Yes Kimber Relic, MD  Calcium Carbonate-Vitamin D (CALCIUM-CARB 600 + D) 600-125 MG-UNIT TABS Take 1 tablet by mouth 2 (two) times daily.    Yes [provider]  carbidopa-levodopa (SINEMET IR) 25-100 MG tablet Take 3 tablets by mouth 2 (two) times daily. 03/23/16  Yes Kimber Relic, MD  cyanocobalamin (,VITAMIN B-12,) 1000 MCG/ML injection Inject 1 mL (1,000 mcg total) into the muscle every 30 (thirty) days. Pt receives on the 15th of every month. 03/31/16  Yes Kimber Relic, MD  gabapentin (NEURONTIN) 300 MG capsule Take 1  capsule (300 mg total) by mouth at bedtime. 03/10/16  Yes Kimber Relic, MD  metFORMIN (GLUCOPHAGE) 1000 MG tablet TAKE 1 TABLET BY MOUTH TWO  TIMES DAILY WITH A MEAL 08/21/16  Yes Reed, Tiffany L, DO  mirtazapine (REMERON) 30 MG tablet TAKE 1 TABLET BY MOUTH AT  BEDTIME 11/05/16  Yes Reed, Tiffany L, DO  mupirocin ointment (BACTROBAN) 2 % APPLY  OINTMENT TOPICALLY AT BEDTIME TO  LESION  ON  SCROTUM 01/06/17  Yes Sharon Seller, NP  pantoprazole (PROTONIX) 40 MG tablet TAKE 1 TABLET BY MOUTH TWO  TIMES DAILY 08/21/16  Yes Reed, Tiffany L, DO  pravastatin (PRAVACHOL) 20 MG tablet Take one tablet by mouth at bedtime 09/18/16  Yes Reed, Tiffany L, DO  tamsulosin (FLOMAX) 0.4 MG CAPS capsule Take 1 capsule (0.4 mg total) by mouth at bedtime. 03/10/16  Yes Kimber Relic, MD  glucose blood test strip Use to test blood sugar up to three times daily Dx:E11.43 03/25/16   Kimber Relic, MD  Lancet Device MISC One Touch [facility] Device Use as Directed in testing blood sugar Dx:E11.9 05/07/16   Kimber Relic, MD  [facility] DELICA LANCETS FINE MISC Check blood sugar once daily DX E11.43 05/07/16   [name], DO    Family History Family History  Problem Relation Age of Onset  . Heart disease Mother   . Diabetes Mother   . Stroke Mother   . Cancer Father        prostate  . Diabetes Sister   . Heart disease Brother     Social History Social History   Tobacco Use  . Smoking status: Former Smoker    Last attempt to quit: 01/31/1974    Years since quitting: 43.0  . Smokeless tobacco: Never Used  Substance Use Topics  . Alcohol use: No    Alcohol/week: 0.0 oz  . Drug use: No     Allergies   Caffeine   Review of Systems Review of Systems  Unable to perform ROS: Dementia     Physical Exam Updated Vital Signs BP (!) 142/73   Pulse 80   Temp 98.6 F (37 C) (Oral)   Resp (!) 29   SpO2 97%   Physical Exam  Constitutional: He appears well-developed and well-nourished. No  distress.  HENT:  Head: Normocephalic and atraumatic.  Eyes: Conjunctivae are normal.  Neck: Neck supple.  Cardiovascular: Normal rate and regular rhythm.  Pulmonary/Chest: He is in respiratory distress. He has no wheezes.  Abdominal: Soft. Bowel sounds are normal. There is no tenderness.  Musculoskeletal: Normal range  of motion. He exhibits no edema.  Neurological: He is alert.  Skin: Skin is warm.  Nursing note and vitals reviewed.    ED Treatments / Results  Labs (all labs ordered are listed, but only abnormal results are displayed) Labs Reviewed  COMPREHENSIVE METABOLIC PANEL - Abnormal; Notable for the following components:      Result Value   Glucose, Bld 156 (*)    BUN 34 (*)    Creatinine, Ser 1.81 (*)    AST 83 (*)    Total Bilirubin 1.3 (*)    GFR calc non Af Amer 33 (*)    GFR calc Af Amer 38 (*)    All other components within normal limits  CBC WITH DIFFERENTIAL/PLATELET - Abnormal; Notable for the following components:   WBC 14.9 (*)    Neutro Abs 13.2 (*)    All other components within normal limits  LACTIC ACID, PLASMA - Abnormal; Notable for the following components:   Lactic Acid, Venous 3.2 (*)    All other components within normal limits  BRAIN NATRIURETIC PEPTIDE - Abnormal; Notable for the following components:   B Natriuretic Peptide 216.2 (*)    All other components within normal limits  CULTURE, BLOOD (ROUTINE X 2)  CULTURE, BLOOD (ROUTINE X 2)  URINE CULTURE  LIPASE, BLOOD  URINALYSIS, ROUTINE W REFLEX MICROSCOPIC  INFLUENZA PANEL BY PCR (TYPE A & B)  I-STAT CG4 LACTIC ACID, ED  I-STAT CG4 LACTIC ACID, ED    EKG  EKG Interpretation None       Radiology Dg Chest 2 View  Result Date: 01/22/2017 CLINICAL DATA:  Fever. EXAM: CHEST  2 VIEW COMPARISON:  12/08/2016 FINDINGS: Chronic interstitial coarsening accentuated by low volumes. Negative for pneumonia or edema. Borderline cardiomegaly. Stable aortic tortuosity. IMPRESSION: Negative  for pneumonia. Electronically Signed   By: Marnee SpringJonathon  Watts M.D.   On: 01/22/2017 18:01   Ct Angio Chest Pe W/cm &/or Wo Cm  Result Date: 01/22/2017 CLINICAL DATA:  Short of breath, cough EXAM: CT ANGIOGRAPHY CHEST WITH CONTRAST TECHNIQUE: Multidetector CT imaging of the chest was performed using the standard protocol during bolus administration of intravenous contrast. Multiplanar CT image reconstructions and MIPs were obtained to evaluate the vascular anatomy. CONTRAST:  70mL ISOVUE-370 IOPAMIDOL (ISOVUE-370) INJECTION 76% COMPARISON:  01/22/2017, CT 07/28/2014 FINDINGS: Cardiovascular: Satisfactory opacification of the pulmonary arteries to the segmental level. No evidence of pulmonary embolism. Normal heart size. No pericardial effusion. Ascending aortic aneurysm, measuring up to 4.3 cm. No dissection. Aortic atherosclerosis. Coronary artery calcification. Mediastinum/Nodes: Midline trachea. Mild debris in the trachea. No thyroid mass. No significant mediastinal adenopathy. Esophagus within normal limits Lungs/Pleura: Stable 5 mm subpleural nodule in the lingula, series 7, image number 74. No change since 2016 and therefore felt benign. No pleural effusion, pneumothorax or consolidation Upper Abdomen: Calcified gallstones.  No acute abnormality. Musculoskeletal: Degenerative changes. No acute or suspicious lesion Review of the MIP images confirms the above findings. IMPRESSION: 1. Negative for acute pulmonary embolus or aortic dissection 2. Stable ascending aortic aneurysm measuring up to 4.3 cm. Recommend annual imaging followup by CTA or MRA. This recommendation follows 2010 ACCF/AHA/AATS/ACR/ASA/SCA/SCAI/SIR/STS/SVM Guidelines for the Diagnosis and Management of Patients with Thoracic Aortic Disease. Circulation. 2010; 121: [id number] 3. Stable 5 mm pulmonary nodule in the lingula, felt benign given lack of interval change 4. Gallstones Aortic Atherosclerosis (ICD10-I70.0). Electronically Signed   By: Jasmine PangKim   Fujinaga M.D.   On: 01/22/2017 20:59    Procedures Procedures (including critical care  time)  CRITICAL CARE Performed by: Camy Leder Total critical care time: 30 minutes Critical care time was exclusive of separately billable procedures and treating other patients. Critical care was necessary to treat or prevent imminent or life-threatening deterioration. Critical care was time spent personally by me on the following activities: development of treatment plan with patient and/or surrogate as well as nursing, discussions with consultants, evaluation of patient's response to treatment, examination of patient, obtaining history from patient or surrogate, ordering and performing treatments and interventions, ordering and review of laboratory studies, ordering and review of radiographic studies, pulse oximetry and re-evaluation of patient's condition.   Medications Ordered in ED Medications  0.9 %  sodium chloride infusion ( Intravenous New Bag/Given 01/22/17 1934)  piperacillin-tazobactam (ZOSYN) IVPB 3.375 g (not administered)  vancomycin (VANCOCIN) IVPB 1000 mg/200 mL premix (1,000 mg Intravenous New Bag/Given 01/22/17 2138)  sodium chloride 0.9 % bolus 500 mL (0 mLs Intravenous Stopped 01/22/17 1916)  iopamidol (ISOVUE-370) 76 % injection (70 mLs  Contrast Given 01/22/17 2026)     Initial Impression / Assessment and Plan / ED Course  I have reviewed the triage vital signs and the nursing notes.  Pertinent labs & imaging results that were available during my care of the patient were reviewed by me and considered in my medical decision making (see chart for details).     Urinalysis is still pending.  Not able to find the source of the infection.  Patient with fevers at home and chills suspected aspiration pneumonia but chest x-ray and CT angios chest negative for any signs of pneumonia or pulmonary embolus.  Off of oxygen patient desats down to 88%.  Does not use home oxygen.   Patient's initial lactic acid was in the 3 range.  Sepsis protocol was initiated.  Patient received broad-spectrum antibiotics does not have a penicillin allergy.  Patient did not require fluid challenge because he was not hypotensive and lactic acid was less than 4.  Does have a leukocytosis.  Discussed with hospitalist they will admit.  Flu testing has been ordered.  Final Clinical Impressions(s) / ED Diagnoses   Final diagnoses:  Hypoxia  Weakness  Fever, unspecified fever cause  Sepsis, due to unspecified organism [facility])    ED Discharge Orders    None       Vanetta Mulders, MD 01/22/17 2228

## 2017-01-22 NOTE — ED Notes (Signed)
Patient is aware that a urine sample is needed but is unable to get one at this time. Pt does have a urinal at the bedside.

## 2017-01-22 NOTE — ED Notes (Signed)
EKG given to Hospitalist,Niu for review. 

## 2017-01-22 NOTE — Progress Notes (Signed)
Pharmacy Antibiotic Note  Nicholas Barnett is a 83 y.o. male with fever, cough and SOB admitted on 01/22/2017 with pneumonia.  Pharmacy has been consulted for zosyn and vancomycin dosing.  Plan: Zosyn 3.375g IV q8h (4 hour infusion). Vancomycin 1 gm x1 then 750 mg IV q24h for est AUC=466 AUC=400-500 Daily scr F/u cultures/levels  Weight: 169 lb 1.5 oz (76.7 kg)  Temp (24hrs), Avg:98 F (36.7 C), Min:97.4 F (36.3 C), Max:98.6 F (37 C)  Recent Labs  Lab 01/22/17 1720 01/22/17 2153  WBC 14.9*  --   CREATININE 1.81*  --   LATICACIDVEN 3.2* 1.23    Estimated Creatinine Clearance: 33.5 mL/min (A) (by C-G formula based on SCr of 1.81 mg/dL (H)).    Allergies  Allergen Reactions  . Caffeine Swelling and Other (See Comments)    Reaction:  Joint swelling    Antimicrobials this admission: 11/30  Zosyn >>  11/30 vancomycin >>   Dose adjustments this admission:   Microbiology results:  BCx:   UCx:    Sputum:    MRSA PCR:   Thank you for allowing pharmacy to be a part of this patient's care.  Lorenza EvangelistGreen, Myeshia Fojtik R 01/22/2017 11:40 PM

## 2017-01-22 NOTE — Progress Notes (Signed)
A consult was received from an ED physician for Vancomycin and Zosyn per pharmacy dosing.  The patient's profile has been reviewed for ht/wt/allergies/indication/available labs.   A one time order has been placed for Zosyn 3.375g and Vanc 1g.  Further antibiotics/pharmacy consults should be ordered by admitting physician if indicated.                       Thank you, Jodelle GrossShade, Presten Joost Elizabeth 01/22/2017  8:43 PM

## 2017-01-22 NOTE — H&P (Signed)
History and Physical    Nicholas Barnett:[id number] DOB: 09-17-1931 DOA: 01/22/2017  Referring MD/NP/PA:   PCP: [name], DO   Patient coming from:  The patient is coming from home.  At baseline, pt is partialy dependent for most of ADL.   Chief Complaint: Fever, cough, shortness breath  HPI: Nicholas Barnett is a 83 y.o. male with medical history significant of Parkinson's disease, difficulty swallowing, hypertension, hyperlipidemia, diabetes mellitus, GERD, hypothyroidism, depression, CKD-2, chronic back pain, diverticulitis, bifascicular block, who presents with fever, cough and shortness of breath.  Per patient's wife and daughter, patient has been having generalized weakness recently. He developed fever and chills today. He also has cough with greenish colored sputum. He has SOB, with oxygen desaturated to 88% per report. His wife states that the patient has difficulty swallowing, but still taking normal diet. He has decreased oral intake due to difficulty swallowing. Patient does not have chest pain. No nausea, vomiting, diarrhea or abdominal pain. No symptoms of UTI or unilateral weakness.  ED Course: pt was found to have WBC 14.9, lactic acid is 3.2, 1.23, worsening renal function, temperature normal, no tachycardia, has tachypnea, negative chest x-ray. CT angiograms negative for PE. Patient is admitted to telemetry bed as inpatient.  Review of Systems:   General: had fevers, chills, no body weight gain, has poor appetite, has fatigue HEENT: no blurry vision, hearing changes or sore throat Respiratory: has dyspnea, coughing, no wheezing CV: no chest pain, no palpitations GI: no nausea, vomiting, abdominal pain, diarrhea, constipation GU: no dysuria, burning on urination, increased urinary frequency, hematuria  Ext: no leg edema Neuro: no unilateral weakness, numbness, or tingling, no vision change or hearing loss Skin: no rash, no skin tear. MSK: No muscle spasm, no  deformity, no limitation of range of movement in spin Heme: No easy bruising.  Travel history: No recent long distant travel.  Allergy:  Allergies  Allergen Reactions  . Caffeine Swelling and Other (See Comments)    Reaction:  Joint swelling    Past Medical History:  Diagnosis Date  . Abnormality of gait   . Arthritis   . Bifascicular block   . Cyanocobalamin deficiency   . Depression   . Diverticulitis   . Dizzy   . Dysphagia, idiopathic    History of aspiration  . Gait disturbance   . Hearing loss of both ears   . Hyperlipidemia   . Hypertension   . Lumbar back pain   . Memory loss   . Neck pain   . Osteoarthritis   . Osteoporosis   . Other and unspecified hyperlipidemia   . Parkinsonism (HCC)   . Recurrent falls    Using a cane and walker  . Restless leg   . Restless legs syndrome (RLS)   . Right knee pain   . Shoulder pain   . Thoracic back pain   . Thyroid disease   . Type 2 diabetes mellitus with autonomic neuropathy (HCC)   . Unspecified essential hypertension   . Unspecified hypothyroidism   . Vitamin D deficiency     Past Surgical History:  Procedure Laterality Date  . CATARACT EXTRACTION Bilateral 2012   Dr. Hazle Quant    Social History:  reports that he quit smoking about 43 years ago. he has never used smokeless tobacco. He reports that he does not drink alcohol or use drugs.  Family History:  Family History  Problem Relation Age of Onset  . Heart disease Mother   .  Diabetes Mother   . Stroke Mother   . Cancer Father        prostate  . Diabetes Sister   . Heart disease Brother      Prior to Admission medications   Medication Sig Start Date End Date Taking? Authorizing Provider  acetaminophen (TYLENOL) 500 MG tablet Take 1,000 mg by mouth every 6 (six) hours as needed for moderate pain or fever.   Yes [provider]  amLODipine (NORVASC) 5 MG tablet Take 1 tablet (5 mg total) by mouth daily. 03/10/16  Yes Kimber RelicGreen, Arthur G, MD    aspirin EC 81 MG tablet Take 81 mg by mouth daily.   Yes [provider]  benazepril (LOTENSIN) 5 MG tablet Take 1 tablet (5 mg total) by mouth daily. 03/10/16  Yes Kimber RelicGreen, Arthur G, MD  Calcium Carbonate-Vitamin D (CALCIUM-CARB 600 + D) 600-125 MG-UNIT TABS Take 1 tablet by mouth 2 (two) times daily.    Yes [provider]  carbidopa-levodopa (SINEMET IR) 25-100 MG tablet Take 3 tablets by mouth 2 (two) times daily. 03/23/16  Yes Kimber RelicGreen, Arthur G, MD  cyanocobalamin (,VITAMIN B-12,) 1000 MCG/ML injection Inject 1 mL (1,000 mcg total) into the muscle every 30 (thirty) days. Pt receives on the 15th of every month. 03/31/16  Yes Kimber RelicGreen, Arthur G, MD  gabapentin (NEURONTIN) 300 MG capsule Take 1 capsule (300 mg total) by mouth at bedtime. 03/10/16  Yes Kimber RelicGreen, Arthur G, MD  metFORMIN (GLUCOPHAGE) 1000 MG tablet TAKE 1 TABLET BY MOUTH TWO  TIMES DAILY WITH A MEAL 08/21/16  Yes Reed, Tiffany L, DO  mirtazapine (REMERON) 30 MG tablet TAKE 1 TABLET BY MOUTH AT  BEDTIME 11/05/16  Yes Reed, Tiffany L, DO  mupirocin ointment (BACTROBAN) 2 % APPLY  OINTMENT TOPICALLY AT BEDTIME TO  LESION  ON  SCROTUM 01/06/17  Yes Sharon SellerEubanks, Jessica K, NP  pantoprazole (PROTONIX) 40 MG tablet TAKE 1 TABLET BY MOUTH TWO  TIMES DAILY 08/21/16  Yes Reed, Tiffany L, DO  pravastatin (PRAVACHOL) 20 MG tablet Take one tablet by mouth at bedtime 09/18/16  Yes Reed, Tiffany L, DO  tamsulosin (FLOMAX) 0.4 MG CAPS capsule Take 1 capsule (0.4 mg total) by mouth at bedtime. 03/10/16  Yes Kimber RelicGreen, Arthur G, MD  glucose blood test strip Use to test blood sugar up to three times daily Dx:E11.43 03/25/16   Kimber RelicGreen, Arthur G, MD  Lancet Device MISC [facility] Lancet Device Use as Directed in testing blood sugar Dx:E11.9 05/07/16   Kimber RelicGreen, Arthur G, MD  [facility] L[facility] FINE MISC Check blood sugar once daily DX E11.43 05/07/16   Kermit Baloeed, Tiffany L, DO    Physical Exam: Vitals:   01/22/17 2000 01/22/17 2100 01/22/17 2200 01/22/17 2300   BP: 115/75 (!) 142/73 116/71 117/83  Pulse: 83 80 81 73  Resp: (!) 31 (!) 29 (!) 26 (!) 26  Temp:      TempSrc:      SpO2: 93% 97% 94% 95%   General: Not in acute distress HEENT:       Eyes: PERRL, EOMI, no scleral icterus.       ENT: No discharge from the ears and nose, no pharynx injection, no tonsillar enlargement.        Neck: No JVD, no bruit, no mass felt. Heme: No neck lymph node enlargement. Cardiac: S1/S2, RRR, No murmurs, No gallops or rubs. Respiratory: No rales, wheezing, rhonchi or rubs. GI: Soft, nondistended, nontender, no rebound pain, no organomegaly, BS  present. GU: No hematuria Ext: No pitting leg edema bilaterally. 2+DP/PT pulse bilaterally. Musculoskeletal: No joint deformities, No joint redness or warmth, no limitation of ROM in spin. Skin: No rashes.  Neuro: Alert, oriented X3, cranial nerves II-XII grossly intact, moves all extremities. sych: Patient is not psychotic, no suicidal or hemocidal ideation.  Labs on Admission: I have personally reviewed following labs and imaging studies  CBC: Recent Labs  Lab 01/22/17 1720  WBC 14.9*  NEUTROABS 13.2*  HGB 15.5  HCT 45.3  MCV 94.2  PLT 206   Basic Metabolic Panel: Recent Labs  Lab 01/22/17 1720  NA 138  K 3.8  CL 102  CO2 28  GLUCOSE 156*  BUN 34*  CREATININE 1.81*  CALCIUM 9.4   GFR: CrCl cannot be calculated (Unknown ideal weight.). Liver Function Tests: Recent Labs  Lab 01/22/17 1720  AST 83*  ALT 34  ALKPHOS 84  BILITOT 1.3*  PROT 7.1  ALBUMIN 3.9   Recent Labs  Lab 01/22/17 1720  LIPASE 21   No results for input(s): AMMONIA in the last 168 hours. Coagulation Profile: No results for input(s): INR, PROTIME in the last 168 hours. Cardiac Enzymes: No results for input(s): CKTOTAL, CKMB, CKMBINDEX, TROPONINI in the last 168 hours. BNP (last 3 results) No results for input(s): PROBNP in the last 8760 hours. HbA1C: No results for input(s): HGBA1C in the last 72  hours. CBG: No results for input(s): GLUCAP in the last 168 hours. Lipid Profile: No results for input(s): CHOL, HDL, LDLCALC, TRIG, CHOLHDL, LDLDIRECT in the last 72 hours. Thyroid Function Tests: No results for input(s): TSH, T4TOTAL, FREET4, T3FREE, THYROIDAB in the last 72 hours. Anemia Panel: No results for input(s): VITAMINB12, FOLATE, FERRITIN, TIBC, IRON, RETICCTPCT in the last 72 hours. Urine analysis:    Component Value Date/Time   COLORURINE YELLOW 01/22/2017 2137   APPEARANCEUR CLEAR 01/22/2017 2137   APPEARANCEUR Cloudy (A) 08/21/2013 1345   LABSPEC >1.046 (H) 01/22/2017 2137   PHURINE 5.0 01/22/2017 2137   GLUCOSEU NEGATIVE 01/22/2017 2137   HGBUR SMALL (A) 01/22/2017 2137   HGBUR 1+ 03/21/2009 1105   BILIRUBINUR NEGATIVE 01/22/2017 2137   BILIRUBINUR small (A) 12/08/2016 1508   BILIRUBINUR Large 10/09/2014 1549   BILIRUBINUR Negative 08/21/2013 1345   KETONESUR NEGATIVE 01/22/2017 2137   PROTEINUR NEGATIVE 01/22/2017 2137   UROBILINOGEN 0.2 12/08/2016 1508   UROBILINOGEN 1.0 11/04/2012 1458   NITRITE NEGATIVE 01/22/2017 2137   LEUKOCYTESUR TRACE (A) 01/22/2017 2137   LEUKOCYTESUR 1+ (A) 08/21/2013 1345   Sepsis Labs: @LABRCNTIP (procalcitonin:4,lacticidven:4) )No results found for this or any previous visit (from the past 240 hour(s)).   Radiological Exams on Admission: Dg Chest 2 View  Result Date: 01/22/2017 CLINICAL DATA:  Fever. EXAM: CHEST  2 VIEW COMPARISON:  12/08/2016 FINDINGS: Chronic interstitial coarsening accentuated by low volumes. Negative for pneumonia or edema. Borderline cardiomegaly. Stable aortic tortuosity. IMPRESSION: Negative for pneumonia. Electronically Signed   By: Marnee Spring M.D.   On: 01/22/2017 18:01   Ct Angio Chest Pe W/cm &/or Wo Cm  Result Date: 01/22/2017 CLINICAL DATA:  Short of breath, cough EXAM: CT ANGIOGRAPHY CHEST WITH CONTRAST TECHNIQUE: Multidetector CT imaging of the chest was performed using the standard  protocol during bolus administration of intravenous contrast. Multiplanar CT image reconstructions and MIPs were obtained to evaluate the vascular anatomy. CONTRAST:  70mL ISOVUE-370 IOPAMIDOL (ISOVUE-370) INJECTION 76% COMPARISON:  01/22/2017, CT 07/28/2014 FINDINGS: Cardiovascular: Satisfactory opacification of the pulmonary arteries to the segmental level. No evidence  of pulmonary embolism. Normal heart size. No pericardial effusion. Ascending aortic aneurysm, measuring up to 4.3 cm. No dissection. Aortic atherosclerosis. Coronary artery calcification. Mediastinum/Nodes: Midline trachea. Mild debris in the trachea. No thyroid mass. No significant mediastinal adenopathy. Esophagus within normal limits Lungs/Pleura: Stable 5 mm subpleural nodule in the lingula, series 7, image number 74. No change since 2016 and therefore felt benign. No pleural effusion, pneumothorax or consolidation Upper Abdomen: Calcified gallstones.  No acute abnormality. Musculoskeletal: Degenerative changes. No acute or suspicious lesion Review of the MIP images confirms the above findings. IMPRESSION: 1. Negative for acute pulmonary embolus or aortic dissection 2. Stable ascending aortic aneurysm measuring up to 4.3 cm. Recommend annual imaging followup by CTA or MRA. This recommendation follows 2010 ACCF/AHA/AATS/ACR/ASA/SCA/SCAI/SIR/STS/SVM Guidelines for the Diagnosis and Management of Patients with Thoracic Aortic Disease. Circulation. 2010; 121: [id number] 3. Stable 5 mm pulmonary nodule in the lingula, felt benign given lack of interval change 4. Gallstones Aortic Atherosclerosis (ICD10-I70.0). Electronically Signed   By: Jasmine PangKim  Fujinaga M.D.   On: 01/22/2017 20:59     EKG: Independently reviewed.  Sinus rhythm, QTC 456, early R-wave progression, biphasicular block.  Assessment/Plan Principal Problem:   Acute respiratory failure with hypoxia (HCC) Active Problems:   Hyperlipemia   Essential hypertension   Parkinson's plus  syndrome (HCC)   GERD (gastroesophageal reflux disease)   Depression, major   Sepsis (HCC)   Type II diabetes mellitus with renal manifestations (HCC)   Acute renal failure superimposed on stage 2 chronic kidney disease (HCC)   Aspiration pneumonia (HCC)   Acute respiratory failure with hypoxia and sepsis:  Most likely due to aspiration pneumonia given difficulty swallowing secondary to Parkinson's disease. Patient meets criteria for sepsis with leukocytosis and tachypnea. Lactic acid is elevated, which is normalized after received 500 mL normal saline. Currently hemodynamically stable.  - will admit to tele bed as inpt - IV Vancomycin and Zosyn were started in ED-->will continue - Mucinex for cough  - prn Albuterol Nebs for SOB - Urine legionella and S. pneumococcal antigen - Follow up blood culture x2, sputum culture, plus Flu pcr - will get Procalcitonin and trend lactic acid level per sepsis protocol - IVF: 2.5L of NS bolus in ED, followed by 125 mL per hour of NS - NPO now and get SLP in AM  HLD: -Pravastatin  HTN:  -Continue home medications: Amlodipine -Hold Lotensin due to worsening renal function -IV hydralazine prn  Parkinson's plus syndrome (HCC): -Sinemet  GERD: -Protonix  Depression: Stable, no suicidal or homicidal ideations. -Continue home medications: Remeron  AoCKD-II: Baseline Cre is 1.0-1.2, pt's Cre is 1.81 and BUN 34 on admission. Likely due to prerenal secondary to dehydration and continuation of ARB - IVF as above - Follow up renal function by BMP - Hold lotensin  Type II diabetes mellitus with renal manifestations (HCC): Last A1c 5.2 on 10/20/16, well controled. Patient is taking metformin at home -SSI  DVT ppx: SQ Lovenox Code Status: DNR Family Communication: Yes, patient's wife and daughter at bed side Disposition Plan:  Anticipate discharge back to previous home environment Consults called: none  Admission status:   Inpatient/tele       Date of Service 01/22/2017    Lorretta HarpXilin Chelsi Warr [facility] Pager [phone]  If 7PM-7AM, please contact night-coverage [email or web address] Password [email or web address] 01/22/2017, 11:35 PM

## 2017-01-22 NOTE — ED Notes (Signed)
Pt is attempting to void in urinal

## 2017-01-22 NOTE — ED Triage Notes (Signed)
Patient BIB EMS from home with c/o weakness. Patient wife states he started complaining of left shoulder pain at 0200 with fever. Patient took tylenol for fever. Patient reports he continued to feel unwell all day. Patient oxygen 91% RA for EMS, 96% 2LNC. Patient has history of aspiration, but lung sounds clear for EMS. Patient has history of Parkinsonians disease. EMS CBG=222.

## 2017-01-23 ENCOUNTER — Other Ambulatory Visit: Payer: Self-pay

## 2017-01-23 LAB — INFLUENZA PANEL BY PCR (TYPE A & B)
Influenza A By PCR: NEGATIVE
Influenza B By PCR: NEGATIVE

## 2017-01-23 LAB — GLUCOSE, CAPILLARY
GLUCOSE-CAPILLARY: 115 mg/dL — AB (ref 65–99)
GLUCOSE-CAPILLARY: 101 mg/dL — AB (ref 65–99)
Glucose-Capillary: 90 mg/dL (ref 65–99)
Glucose-Capillary: 80 mg/dL (ref 65–99)

## 2017-01-23 LAB — PROCALCITONIN: Procalcitonin: 4.03 ng/mL

## 2017-01-23 LAB — LACTIC ACID, PLASMA: LACTIC ACID, VENOUS: 0.9 mmol/L (ref 0.5–1.9)

## 2017-01-23 LAB — STREP PNEUMONIAE URINARY ANTIGEN: STREP PNEUMO URINARY ANTIGEN: NEGATIVE

## 2017-01-23 LAB — HIV ANTIBODY (ROUTINE TESTING W REFLEX): HIV SCREEN 4TH GENERATION: NONREACTIVE

## 2017-01-23 MED ORDER — VANCOMYCIN HCL IN DEXTROSE 750-5 MG/150ML-% IV SOLN
750.0000 mg | INTRAVENOUS | Status: DC
  Administered 2017-01-23: 750 mg via INTRAVENOUS
  Filled 2017-01-23 (×2): qty 150

## 2017-01-23 MED ORDER — PIPERACILLIN-TAZOBACTAM 3.375 G IVPB
3.3750 g | Freq: Three times a day (TID) | INTRAVENOUS | Status: DC
  Administered 2017-01-23 – 2017-01-25 (×7): 3.375 g via INTRAVENOUS
  Filled 2017-01-23 (×8): qty 50

## 2017-01-23 MED ORDER — PANTOPRAZOLE SODIUM 40 MG IV SOLR
40.0000 mg | Freq: Every day | INTRAVENOUS | Status: DC
  Administered 2017-01-23 – 2017-01-26 (×5): 40 mg via INTRAVENOUS
  Filled 2017-01-23 (×4): qty 40

## 2017-01-23 MED ORDER — RESOURCE THICKENUP CLEAR PO POWD
ORAL | Status: DC | PRN
  Filled 2017-01-23: qty 125

## 2017-01-23 MED ORDER — ORAL CARE MOUTH RINSE
15.0000 mL | Freq: Two times a day (BID) | OROMUCOSAL | Status: DC
  Administered 2017-01-23 – 2017-01-26 (×6): 15 mL via OROMUCOSAL

## 2017-01-23 NOTE — Evaluation (Signed)
Clinical/Bedside Swallow Evaluation Patient Details  Name: Nicholas HalterDavid L Barnett MRN: [id number] Date of Birth: 07/04/1931  Today's Date: 01/23/2017 Time: SLP Start Time (ACUTE ONLY): 1135 SLP Stop Time (ACUTE ONLY): 1221 SLP Time Calculation (min) (ACUTE ONLY): 46 min  Past Medical History:  Past Medical History:  Diagnosis Date  . Abnormality of gait   . Arthritis   . Bifascicular block   . Cyanocobalamin deficiency   . Depression   . Diverticulitis   . Dizzy   . Dysphagia, idiopathic    History of aspiration  . Gait disturbance   . Hearing loss of both ears   . Hyperlipidemia   . Hypertension   . Lumbar back pain   . Memory loss   . Neck pain   . Osteoarthritis   . Osteoporosis   . Other and unspecified hyperlipidemia   . Parkinsonism (HCC)   . Recurrent falls    Using a cane and walker  . Restless leg   . Restless legs syndrome (RLS)   . Right knee pain   . Shoulder pain   . Thoracic back pain   . Thyroid disease   . Type 2 diabetes mellitus with autonomic neuropathy (HCC)   . Unspecified essential hypertension   . Unspecified hypothyroidism   . Vitamin D deficiency    Past Surgical History:  Past Surgical History:  Procedure Laterality Date  . CATARACT EXTRACTION Bilateral 2012   Dr. Hazle Quantigby   HPI:  83 [facility], dyspnea, Parkinsons disease, GERD, and generalized weakness.  Pt's voice is gurgly - family reports this occurence xs several days.     Assessment / Plan / Recommendation Clinical Impression  Pt presents with much worsening dysphagia compared to MBS in 07/2014.  Currently he is grossly aspirating secretions and his cough/expectoration is weak which prevents him from adequate clearance.    Pt and daughter Arline AspCindy in room and both again acknowledge pt coughing with intake at home at this same level observed in hospital.  Pt has been chronically aspirating and suspect contributes to weight loss and current medical/respiratory condition.     Pt and daugher Arline AspCindy state they want pt to eat regardless of aspiration risk.  Do not recommend MBS for this pt as it will not change his care plan given his wishes.    If MD agrees to po diet with known risks, advised clear liquids only but pt wants solids and not thickened liquids. Pt will aspirate regardless of consistency provided.  Chopped/thin was agreeable to pt with aspiration precautions.    Reviewed with pt and daughter ramifications of aspiration including recurrent pnas, airway compromise.  Educated to attempt to strengthen cough/hock, multiple swallows and try chin tucking.    Reviewed increased aspiration ramifications with decreased mobility status/decreased cough/expectoration strength.  Extensive education took place with pt/daughter.  Recommend consider palliative consult to establish goals of care given progressive nature of dysphagia and overt airway protection concerns.   SLP Visit Diagnosis: Dysphagia, oropharyngeal phase (R13.12)    Aspiration Risk  Severe aspiration risk;Risk for inadequate nutrition/hydration    Diet Recommendation Dysphagia 2 (Fine chop);Thin liquid   Medication Administration: Other (Comment)(liquid or crushed with puree) Supervision: Staff to assist with self feeding Compensations: Slow rate;Small sips/bites;Follow solids with liquid;Multiple dry swallows after each bite/sip;Chin tuck;Hard cough after swallow Postural Changes: Seated upright at 90 degrees;Remain upright for at least 30 minutes after po intake    Other  Recommendations Oral Care Recommendations: Oral care  QID   Follow up Recommendations        Frequency and Duration min 1 x/week  1 week       Prognosis Prognosis for Safe Diet Advancement: Guarded Barriers to Reach Goals: Other (Comment);Severity of deficits;Time post onset      Swallow Study   General Date of Onset: 01/23/17 HPI: 83 [facility] cough, dyspnea, Parkinsons disease, GERD, and generalized  weakness.  Pt's voice is gurgly - family reports this occurence xs several days.   Type of Study: Bedside Swallow Evaluation Previous Swallow Assessment: MBS 07/30/2014 mild moderate oropharyngeal dysphagia Diet Prior to this Study: NPO Temperature Spikes Noted: No Respiratory Status: Nasal cannula History of Recent Intubation: No Behavior/Cognition: Alert;Cooperative;Other (Comment)(flat affect, hypomimia) Oral Cavity Assessment: Dried secretions Oral Care Completed by SLP: Yes Oral Cavity - Dentition: Adequate natural dentition Vision: Functional for self-feeding Self-Feeding Abilities: Total assist Patient Positioning: Upright in bed Baseline Vocal Quality: Low vocal intensity;Suspected CN X (Vagus) involvement;Other (comment)(gurgly) Volitional Cough: Weak;Congested;Wet Volitional Swallow: Able to elicit    Oral/Motor/Sensory Function Overall Oral Motor/Sensory Function: Other (comment)(left facial asymmetry noted, not apparent with emotional responses)   Ice Chips Ice chips: Impaired Presentation: Spoon Pharyngeal Phase Impairments: Suspected delayed Swallow;Multiple swallows;Cough - Delayed;Wet Vocal Quality   Thin Liquid Thin Liquid: Impaired Presentation: Cup;Spoon Pharyngeal  Phase Impairments: Suspected delayed Swallow;Decreased hyoid-laryngeal movement;Multiple swallows;Cough - Immediate;Cough - Delayed;Wet Vocal Quality    Nectar Thick Nectar Thick Liquid: Impaired Presentation: Cup Pharyngeal Phase Impairments: Suspected delayed Swallow;Multiple swallows;Wet Vocal Quality;Cough - Delayed   Honey Thick Honey Thick Liquid: Not tested   Puree Puree: Impaired Presentation: Self Fed;Spoon Pharyngeal Phase Impairments: Decreased hyoid-laryngeal movement;Multiple swallows;Wet Vocal Quality;Throat Clearing - Immediate   Solid   GO   Solid: Impaired Pharyngeal Phase Impairments: Suspected delayed Swallow;Multiple swallows;Wet Vocal Quality        Nicholas AbrahamsKimball, [name]  [name] 01/23/2017,12:40 PM  Nicholas Barnett Nicholas Andres, MS [facility] SLP [phone]

## 2017-01-23 NOTE — Progress Notes (Signed)
  Speech Language Pathology Treatment: Dysphagia  Patient Details Name: Nicholas HalterDavid L Honaker MRN: [id number] DOB: 1931-07-24 Today's Date: 01/23/2017 Time: [id number] SLP Time Calculation (min) (ACUTE ONLY): 16 min  Assessment / Plan / Recommendation Clinical Impression  SLP went to provide written signs/documentation for swallow precautions.  Daughter Arline AspCindy had further question and concerns.  Therefore education with daughter Arline AspCindy and pt regarding option for Provale Cup usage with thin liquids for bolus control may be helpful.  Airway obstruction risk and further modificaton *tsp* use of thickener in drinks *or drinking Ensure to increase comfort with intake.  Also discussed recommendation for consideration for palliative consult to establish pt goals/manage symptoms with chronic progressive medical diagnosis - pt states "I don't think much of that" but daughter Arline AspCindy reports seeing benefit.    Pt agreeable to consider possible palliative meeting but did not commit to it. Requested pt's daughter Arline AspCindy research outcomes with palliative referrals to help pt in decision making.     SLP to order thickener for him to use if decreases cough/increases comfort with intake.  Advised thicker drinks would not prevent aspiration!  Pt admits he coughs less with Ensure than thinner drinks even prior to admission, SLP suspects due to decreased amount of aspiration.    Advised family will follow up x1 to assure they are understanding of information provided.    HPI HPI: see eval      SLP Plan  Continue with current plan of care       Recommendations  Diet recommendations: Other(comment);NPO(dys2/thin - use nectar if decreases cough - pt desires po regardless of risk) Medication Administration: Other (Comment)(liquid or crushed with puree) Compensations: Slow rate;Small sips/bites;Follow solids with liquid;Multiple dry swallows after each bite/sip;Chin tuck;Hard cough after swallow                Oral  Care Recommendations: Oral care QID SLP Visit Diagnosis: Dysphagia, oropharyngeal phase (R13.12) Plan: Continue with current plan of care       GO               Donavan Burnetamara Aneliz Carbary, MS [facility] SLP [phone]  [name], Andrew Soria [name] 01/23/2017, 1:37 PM

## 2017-01-23 NOTE — ED Notes (Signed)
Nicholas Barnett, she requests that pt come up in 20 minutes

## 2017-01-23 NOTE — Progress Notes (Signed)
[facility]                                                                              Patient Demographics  Nicholas Barnett, is a 83 y.o. male, DOB - 08/16/1931, ZOX:[id number]  Admit date - 01/22/2017   Admitting Physician Lorretta HarpXilin Niu, MD  Outpatient Primary MD for the patient is Kermit Baloeed, Tiffany L, DO  Outpatient specialists:   LOS - 1  days    Chief Complaint  Patient presents with  . Weakness       Brief summary  : Nicholas Barnett is a 83 y.o. male with medical history significant of Parkinson's disease, difficulty swallowing, hypertension, hyperlipidemia, diabetes mellitus, GERD, hypothyroidism, depression, CKD-2, chronic back pain, diverticulitis, bifascicular block, who presents with fever, cough and shortness of breath. Pt was found to have WBC 14.9, lactic acid is 3.2, 1.23, worsening renal function, temperature normal, no tachycardia, has tachypnea, negative chest x-ray. CT angiograms negative for PE.   Assessment & Plan    Principal Problem:   Acute respiratory failure with hypoxia (HCC) Active Problems:   Hyperlipemia   Essential hypertension   Parkinson's plus syndrome (HCC)   GERD (gastroesophageal reflux disease)   Depression, major   Sepsis (HCC)   Type II diabetes mellitus with renal manifestations (HCC)   Acute renal failure superimposed on stage 2 chronic kidney disease (HCC)   Aspiration pneumonia (HCC)  Acute respiratory failure with hypoxia and sepsis:  Most likely due to aspiration pneumonitis/?? pneumonia given difficulty swallowing secondary to Parkinson's disease. Patient meets criteria for sepsis with leukocytosis and tachypnea. Lactic acid is elevated, which is normalized after received 500 mL normal saline. Currently hemodynamically stable. - IV Vancomycin and Zosyn  - Mucinex for cough  - prn Albuterol Nebs for SOB - Urine legionella and S. pneumococcal antigen - Follow up blood culture x2, sputum culture, plus Flu pcr - w  Procalcitonin and trend lactic acid level per sepsis protocol -  -NPO - awaiting SLP eval  HLD: -Pravastatin  HTN:  -Continue home medications: Amlodipine -Hold Lotensin due to worsening renal function -IV hydralazine prn  Parkinson's plus syndrome (HCC): -Sinemet  GERD: -Protonix  Depression: Stable, no suicidal or homicidal ideations. -Continue home medications: Remeron  AoCKD-II: Baseline Cre is 1.0-1.2, pt's Cre is 1.81 and BUN 34 on admission. Likely due to prerenal secondary to dehydration and continuation of ARB - IVF as above -Monitor renal function with electrolytes - Hold lotensin  Type II diabetes mellitus with renal manifestations (HCC):  Last A1c 5.2 on 10/20/16, well controled.  Patient is taking metformin at home -SSI    Code Status: DNR DVT Prophylaxis:  Lovenox  Family Communication: Discussed in detail with the patient, all imaging results, lab results explained to the patient and daughter   Disposition Plan: To be determined  Time Spent in minutes  40 minutes  Procedures:   Consultants:    Antimicrobials:   IV vancomycin  Zosyn   Medications  Scheduled Meds: . amLODipine  5 mg Oral Daily  . aspirin EC  81 mg Oral Daily  . calcium-vitamin D  1 tablet Oral BID  .  carbidopa-levodopa  3 tablet Oral 2 times per day  . [START ON 02/06/2017] cyanocobalamin  1,000 mcg Intramuscular Q30 days  . dextromethorphan-guaiFENesin  1 tablet Oral BID  . enoxaparin (LOVENOX) injection  40 mg Subcutaneous Q24H  . gabapentin  300 mg Oral QHS  . insulin aspart  0-5 Units Subcutaneous QHS  . insulin aspart  0-9 Units Subcutaneous TID WC  . mouth rinse  15 mL Mouth Rinse BID  . mirtazapine  30 mg Oral QHS  . mupirocin ointment   Topical Daily  . pantoprazole (PROTONIX) IV  40 mg Intravenous QHS  . pravastatin  20 mg Oral q1800  . tamsulosin  0.4 mg Oral QHS   Continuous Infusions: . sodium chloride 125 mL/hr at 01/22/17 2300  .  piperacillin-tazobactam (ZOSYN)  IV Stopped (01/23/17 1610)  . vancomycin     PRN Meds:.acetaminophen, albuterol, hydrALAZINE, ondansetron, zolpidem   Antibiotics   Anti-infectives (From admission, onward)   Start     Dose/Rate Route Frequency Ordered Stop   01/23/17 1200  vancomycin (VANCOCIN) IVPB 750 mg/150 ml premix     750 mg 150 mL/hr over 60 Minutes Intravenous Every 24 hours 01/23/17 0038     01/23/17 0400  piperacillin-tazobactam (ZOSYN) IVPB 3.375 g     3.375 g 12.5 mL/hr over 240 Minutes Intravenous Every 8 hours 01/23/17 0038     01/22/17 2030  piperacillin-tazobactam (ZOSYN) IVPB 3.375 g     3.375 g 100 mL/hr over 30 Minutes Intravenous  Once 01/22/17 2022 01/22/17 2257   01/22/17 2030  vancomycin (VANCOCIN) IVPB 1000 mg/200 mL premix     1,000 mg 200 mL/hr over 60 Minutes Intravenous  Once 01/22/17 2022 01/22/17 2257        Subjective:   Nicholas Barnett was seen and examined today. Patient denies dizziness, chest pain, shortness of breath, abdominal pain, No acute events overnight.    Objective:   Vitals:   01/23/17 0000 01/23/17 0055 01/23/17 0100 01/23/17 0459  BP: 120/63  125/70 120/61  Pulse: 64  66 (!) 53  Resp: (!) 23  18 18   Temp:   98.9 F (37.2 C) 98.3 F (36.8 C)  TempSrc:   Axillary Axillary  SpO2: 96%  99% 100%  Weight:      Height:  6\' 1"  (1.854 m)      Intake/Output Summary (Last 24 hours) at 01/23/2017 1131 Last data filed at 01/23/2017 0846 Gross per 24 hour  Intake 1867.5 ml  Output -  Net 1867.5 ml     Wt Readings from Last 3 Encounters:  01/22/17 76.7 kg (169 lb 1.5 oz)  12/28/16 76.7 kg (169 lb)  10/22/16 78.5 kg (173 lb)     Exam  General: NAD  HEENT: NCAT,  PERRL,MMM  Neck: SUPPLE, (-) JVD  Cardiovascular: RRR, (-) GALLOP, (-) MURMUR  Respiratory: Coarse breath sounds, otherwise CTA  Gastrointestinal: SOFT, (-) DISTENSION, BS(+), (_) TENDERNESS  Ext: (-) CYANOSIS, (-) EDEMA  Neuro: A, OX 3  Skin:(-)  RASH  Psych:NORMAL AFFECT/MOOD   Data Reviewed:  I have personally reviewed following labs and imaging studies  Micro Results No results found for this or any previous visit (from the past 240 hour(s)).  Radiology Reports Dg Chest 2 View  Result Date: 01/22/2017 CLINICAL DATA:  Fever. EXAM: CHEST  2 VIEW COMPARISON:  12/08/2016 FINDINGS: Chronic interstitial coarsening accentuated by low volumes. Negative for pneumonia or edema. Borderline cardiomegaly. Stable aortic tortuosity. IMPRESSION: Negative for pneumonia. Electronically Signed   By:  Marnee SpringJonathon  Watts M.D.   On: 01/22/2017 18:01   Ct Angio Chest Pe W/cm &/or Wo Cm  Result Date: 01/22/2017 CLINICAL DATA:  Short of breath, cough EXAM: CT ANGIOGRAPHY CHEST WITH CONTRAST TECHNIQUE: Multidetector CT imaging of the chest was performed using the standard protocol during bolus administration of intravenous contrast. Multiplanar CT image reconstructions and MIPs were obtained to evaluate the vascular anatomy. CONTRAST:  70mL ISOVUE-370 IOPAMIDOL (ISOVUE-370) INJECTION 76% COMPARISON:  01/22/2017, CT 07/28/2014 FINDINGS: Cardiovascular: Satisfactory opacification of the pulmonary arteries to the segmental level. No evidence of pulmonary embolism. Normal heart size. No pericardial effusion. Ascending aortic aneurysm, measuring up to 4.3 cm. No dissection. Aortic atherosclerosis. Coronary artery calcification. Mediastinum/Nodes: Midline trachea. Mild debris in the trachea. No thyroid mass. No significant mediastinal adenopathy. Esophagus within normal limits Lungs/Pleura: Stable 5 mm subpleural nodule in the lingula, series 7, image number 74. No change since 2016 and therefore felt benign. No pleural effusion, pneumothorax or consolidation Upper Abdomen: Calcified gallstones.  No acute abnormality. Musculoskeletal: Degenerative changes. No acute or suspicious lesion Review of the MIP images confirms the above findings. IMPRESSION: 1. Negative for acute  pulmonary embolus or aortic dissection 2. Stable ascending aortic aneurysm measuring up to 4.3 cm. Recommend annual imaging followup by CTA or MRA. This recommendation follows 2010 ACCF/AHA/AATS/ACR/ASA/SCA/SCAI/SIR/STS/SVM Guidelines for the Diagnosis and Management of Patients with Thoracic Aortic Disease. Circulation. 2010; 121: [id number] 3. Stable 5 mm pulmonary nodule in the lingula, felt benign given lack of interval change 4. Gallstones Aortic Atherosclerosis (ICD10-I70.0). Electronically Signed   By: Jasmine PangKim  Fujinaga M.D.   On: 01/22/2017 20:59    Lab Data:  CBC: Recent Labs  Lab 01/22/17 1720  WBC 14.9*  NEUTROABS 13.2*  HGB 15.5  HCT 45.3  MCV 94.2  PLT 206   Basic Metabolic Panel: Recent Labs  Lab 01/22/17 1720  NA 138  K 3.8  CL 102  CO2 28  GLUCOSE 156*  BUN 34*  CREATININE 1.81*  CALCIUM 9.4   GFR: Estimated Creatinine Clearance: 33.5 mL/min (A) (by C-G formula based on SCr of 1.81 mg/dL (H)). Liver Function Tests: Recent Labs  Lab 01/22/17 1720  AST 83*  ALT 34  ALKPHOS 84  BILITOT 1.3*  PROT 7.1  ALBUMIN 3.9   Recent Labs  Lab 01/22/17 1720  LIPASE 21   No results for input(s): AMMONIA in the last 168 hours. Coagulation Profile: No results for input(s): INR, PROTIME in the last 168 hours. Cardiac Enzymes: No results for input(s): CKTOTAL, CKMB, CKMBINDEX, TROPONINI in the last 168 hours. BNP (last 3 results) No results for input(s): PROBNP in the last 8760 hours. HbA1C: No results for input(s): HGBA1C in the last 72 hours. CBG: Recent Labs  Lab 01/23/17 0142  GLUCAP 115*   Lipid Profile: No results for input(s): CHOL, HDL, LDLCALC, TRIG, CHOLHDL, LDLDIRECT in the last 72 hours. Thyroid Function Tests: No results for input(s): TSH, T4TOTAL, FREET4, T3FREE, THYROIDAB in the last 72 hours. Anemia Panel: No results for input(s): VITAMINB12, FOLATE, FERRITIN, TIBC, IRON, RETICCTPCT in the last 72 hours. Urine analysis:    Component Value  Date/Time   COLORURINE YELLOW 01/22/2017 2137   APPEARANCEUR CLEAR 01/22/2017 2137   APPEARANCEUR Cloudy (A) 08/21/2013 1345   LABSPEC >1.046 (H) 01/22/2017 2137   PHURINE 5.0 01/22/2017 2137   GLUCOSEU NEGATIVE 01/22/2017 2137   HGBUR SMALL (A) 01/22/2017 2137   HGBUR 1+ 03/21/2009 1105   BILIRUBINUR NEGATIVE 01/22/2017 2137   BILIRUBINUR small (A) 12/08/2016 1508  BILIRUBINUR Large 10/09/2014 1549   BILIRUBINUR Negative 08/21/2013 1345   KETONESUR NEGATIVE 01/22/2017 2137   PROTEINUR NEGATIVE 01/22/2017 2137   UROBILINOGEN 0.2 12/08/2016 1508   UROBILINOGEN 1.0 11/04/2012 1458   NITRITE NEGATIVE 01/22/2017 2137   LEUKOCYTESUR TRACE (A) 01/22/2017 2137   LEUKOCYTESUR 1+ (A) 08/21/2013 1345     OSEI-BONSU,Faigy Stretch M.D. [facility] 01/23/2017, 11:31 AM  Pager: [phone] Between 7am to 7pm - call Pager - [phone]  After 7pm go to [email or web address] - password [email or web address]  Call night coverage person covering after 7pm

## 2017-01-24 LAB — GLUCOSE, CAPILLARY
Glucose-Capillary: 80 mg/dL (ref 65–99)
Glucose-Capillary: 76 mg/dL (ref 65–99)
Glucose-Capillary: 142 mg/dL — ABNORMAL HIGH (ref 65–99)
Glucose-Capillary: 174 mg/dL — ABNORMAL HIGH (ref 65–99)

## 2017-01-24 LAB — CREATININE, SERUM: CREATININE: 0.98 mg/dL (ref 0.61–1.24)

## 2017-01-24 LAB — URINE CULTURE

## 2017-01-24 MED ORDER — VANCOMYCIN HCL 10 G IV SOLR
1250.0000 mg | INTRAVENOUS | Status: DC
  Administered 2017-01-24: 1250 mg via INTRAVENOUS
  Filled 2017-01-24: qty 1250

## 2017-01-24 NOTE — Plan of Care (Signed)
  Nutrition: Adequate nutrition will be maintained 01/24/2017 0856 - Not Progressing by William Daltonarpenter, Lachelle Rissler L, RN   Pain Managment: General experience of comfort will improve 01/24/2017 0856 - Progressing by William Daltonarpenter, Lavina Resor L, RN   Elimination: Will not experience complications related to bowel motility 01/24/2017 0856 - Progressing by William Daltonarpenter, Bashar Milam L, RN

## 2017-01-24 NOTE — Progress Notes (Signed)
Pharmacy Antibiotic Note  Nicholas HalterDavid L Barnett is a 83 y.o. male admitted on 01/22/2017 with possible aspiration pneumonia.  Pharmacy has been consulted for vancomycin and Zosyn dosing.  Day #2 Vancomycin and Zosyn. SCr improved, warranting dose adjustment to vancomycin.  Plan: Adjust vancomycin to 1250 mg IV q24h for AUC goal 400-500.  Continue Zosyn 3.375g IV q8h (4 hour infusion time).  If patient does not have risk factors for MRSA or Pseudomonas, recommend switching to Unasyn for empiric treatment of aspiration pneumonia.  Height: 6\' 1"  (185.4 cm) Weight: 169 lb 1.5 oz (76.7 kg) IBW/kg (Calculated) : 79.9  Temp (24hrs), Avg:99.2 F (37.3 C), Min:99 F (37.2 C), Max:99.3 F (37.4 C)  Recent Labs  Lab 01/22/17 1720 01/22/17 2153 01/23/17 0257 01/24/17 0550  WBC 14.9*  --   --   --   CREATININE 1.81*  --   --  0.98  LATICACIDVEN 3.2* 1.23 0.9  --     Estimated Creatinine Clearance: 62 mL/min (by C-G formula based on SCr of 0.98 mg/dL).    Allergies  Allergen Reactions  . Caffeine Swelling and Other (See Comments)    Reaction:  Joint swelling    Antimicrobials this admission: 11/30 zosyn >>  11/30 vancomycin >>   Dose adjustments this admission:  Microbiology results: 11/30 BCx ngtd 11/30 UCx: insignificant growth 11/30 strep pneum ur ag: neg 11/30 legionella ur ag: neg 11/30 influenza neg  Thank you for allowing pharmacy to be a part of this patient's care.  Clance BollRunyon, Beonka Amesquita 01/24/2017 2:10 PM

## 2017-01-24 NOTE — Progress Notes (Signed)
[facility]                                                                              Patient Demographics  Nicholas Barnett, is a 83 y.o. male, DOB - 08-Jan-1932, ZOX:[id number]  Admit date - 01/22/2017   Admitting Physician Lorretta Harp, MD  Outpatient Primary MD for the patient is [name], DO  Outpatient specialists:   LOS - 2  days    Chief Complaint  Patient presents with  . Weakness       Brief summary  : Nicholas Barnett is a 83 y.o. male with medical history significant of Parkinson's disease, difficulty swallowing, hypertension, hyperlipidemia, diabetes mellitus, GERD, hypothyroidism, depression, CKD-2, chronic back pain, diverticulitis, bifascicular block, who presents with fever, cough and shortness of breath. Pt was found to have WBC 14.9, lactic acid is 3.2, 1.23, worsening renal function, temperature normal, no tachycardia, has tachypnea, negative chest x-ray. CT angiograms negative for PE.   Assessment & Plan    Principal Problem:   Acute respiratory failure with hypoxia (HCC) Active Problems:   Hyperlipemia   Essential hypertension   Parkinson's plus syndrome (HCC)   GERD (gastroesophageal reflux disease)   Depression, major   Sepsis (HCC)   Type II diabetes mellitus with renal manifestations (HCC)   Acute renal failure superimposed on stage 2 chronic kidney disease (HCC)   Aspiration pneumonia (HCC)  Acute respiratory failure with hypoxia and sepsis:  Most likely due to aspiration pneumonitis/?? pneumonia given difficulty swallowing secondary to Parkinson's disease. Patient meets criteria for sepsis with leukocytosis and tachypnea. Lactic acid is elevated, which is normalized after received 500 mL normal saline. Currently hemodynamically stable. - IV Vancomycin and Zosyn  - Mucinex for cough  - prn Albuterol Nebs for SOB - Urine legionella and S. pneumococcal antigen - Follow up blood culture x2, sputum culture, plus Flu pcr - w  Procalcitonin and trend lactic acid level per sepsis protocol -  -Discontinue NPO -start dysphagia 2 diet -Palliative care consult-goals of care HLD: -Pravastatin  HTN:  -Continue home medications: Amlodipine -Hold Lotensin due to worsening renal function -IV hydralazine prn  Parkinson's plus syndrome (HCC): -Sinemet  GERD: -Protonix  Depression: Stable, no suicidal or homicidal ideations. -Continue home medications: Remeron  AoCKD-II: Baseline Cre is 1.0-1.2, pt's Cre is 1.81 and BUN 34 on admission. Likely due to prerenal secondary to dehydration and continuation of ARB - IVF as above -Monitor renal function with electrolytes - Hold lotensin  Type II diabetes mellitus with renal manifestations (HCC):  Last A1c 5.2 on 10/20/16, well controled.  Patient is taking metformin at home -SSI    Code Status: DNR DVT Prophylaxis:  Lovenox  Family Communication: Discussed in detail with the patient, all imaging results, lab results explained to the patient and daughter   Disposition Plan: To be determined  Time Spent in minutes  40 minutes  Procedures:   Consultants:    Antimicrobials:   IV vancomycin  Zosyn   Medications  Scheduled Meds: . amLODipine  5 mg Oral Daily  . aspirin EC  81 mg Oral Daily  . calcium-vitamin D  1 tablet Oral BID  . carbidopa-levodopa  3 tablet Oral 2 times per day  . [START ON 02/06/2017] cyanocobalamin  1,000 mcg Intramuscular Q30 days  . dextromethorphan-guaiFENesin  1 tablet Oral BID  . enoxaparin (LOVENOX) injection  40 mg Subcutaneous Q24H  . gabapentin  300 mg Oral QHS  . insulin aspart  0-5 Units Subcutaneous QHS  . insulin aspart  0-9 Units Subcutaneous TID WC  . mouth rinse  15 mL Mouth Rinse BID  . mirtazapine  30 mg Oral QHS  . mupirocin ointment   Topical Daily  . pantoprazole (PROTONIX) IV  40 mg Intravenous QHS  . pravastatin  20 mg Oral q1800  . tamsulosin  0.4 mg Oral QHS   Continuous Infusions: . sodium  chloride 125 mL/hr at 01/24/17 0529  . piperacillin-tazobactam (ZOSYN)  IV 3.375 g (01/24/17 0529)  . vancomycin Stopped (01/23/17 1300)   PRN Meds:.acetaminophen, albuterol, hydrALAZINE, ondansetron, RESOURCE THICKENUP CLEAR, zolpidem   Antibiotics   Anti-infectives (From admission, onward)   Start     Dose/Rate Route Frequency Ordered Stop   01/23/17 1200  vancomycin (VANCOCIN) IVPB 750 mg/150 ml premix     750 mg 150 mL/hr over 60 Minutes Intravenous Every 24 hours 01/23/17 0038     01/23/17 0400  piperacillin-tazobactam (ZOSYN) IVPB 3.375 g     3.375 g 12.5 mL/hr over 240 Minutes Intravenous Every 8 hours 01/23/17 0038     01/22/17 2030  piperacillin-tazobactam (ZOSYN) IVPB 3.375 g     3.375 g 100 mL/hr over 30 Minutes Intravenous  Once 01/22/17 2022 01/22/17 2257   01/22/17 2030  vancomycin (VANCOCIN) IVPB 1000 mg/200 mL premix     1,000 mg 200 mL/hr over 60 Minutes Intravenous  Once 01/22/17 2022 01/22/17 2257        Subjective:   Nicholas Barnett was seen and examined today.  Sitting comfortably in chair.  Family updated at bedside including daughter and wife  Objective:   Vitals:   01/23/17 1338 01/23/17 2147 01/24/17 0446 01/24/17 1053  BP: (!) 153/80 (!) 171/84 139/83 (!) 149/79  Pulse: 70 71 62 68  Resp: 18 17 18    Temp: 98.5 F (36.9 C) 99.3 F (37.4 C) 99 F (37.2 C)   TempSrc: Oral Axillary Axillary   SpO2: 97% 99% 98% 100%  Weight:      Height:        Intake/Output Summary (Last 24 hours) at 01/24/2017 1218 Last data filed at 01/24/2017 1610 Gross per 24 hour  Intake 3116.67 ml  Output 1950 ml  Net 1166.67 ml     Wt Readings from Last 3 Encounters:  01/22/17 76.7 kg (169 lb 1.5 oz)  12/28/16 76.7 kg (169 lb)  10/22/16 78.5 kg (173 lb)     Exam  General: NAD  HEENT: NCAT,  PERRL,MMM  Neck: SUPPLE, (-) JVD  Cardiovascular: RRR, (-) GALLOP, (-) MURMUR  Respiratory: Coarse breath sounds, otherwise CTA  Gastrointestinal: SOFT, (-)  DISTENSION, BS(+), (_) TENDERNESS  Ext: (-) CYANOSIS, (-) EDEMA  Neuro: A, OX 3  Skin:(-) RASH  Psych:NORMAL AFFECT/MOOD   Data Reviewed:  I have personally reviewed following labs and imaging studies  Micro Results Recent Results (from the past 240 hour(s))  Culture, blood (Routine X 2) w Reflex to ID Panel     Status: None (Preliminary result)   Collection Time: 01/22/17  6:20 PM  Result Value Ref Range Status   Specimen Description BLOOD RIGHT ANTECUBITAL  Final   Special Requests  Final    BOTTLES DRAWN AEROBIC AND ANAEROBIC Blood Culture adequate volume   Culture   Final    NO GROWTH < 24 HOURS Performed at [facility], [address] [place], [place] [address]    Report Status PENDING  Incomplete  Culture, blood (Routine X 2) w Reflex to ID Panel     Status: None (Preliminary result)   Collection Time: 01/22/17  6:25 PM  Result Value Ref Range Status   Specimen Description BLOOD LEFT HAND  Final   Special Requests IN PEDIATRIC BOTTLE Blood Culture adequate volume  Final   Culture   Final    NO GROWTH < 24 HOURS Performed at [facility], [address] [place], [place] [address]    Report Status PENDING  Incomplete  Urine Culture     Status: Abnormal   Collection Time: 01/22/17  9:38 PM  Result Value Ref Range Status   Specimen Description URINE, CATHETERIZED  Final   Special Requests NONE  Final   Culture (A)  Final    <10,000 COLONIES/mL INSIGNIFICANT GROWTH Performed at [facility], [address] [place], [place] [address]    Report Status 01/24/2017 FINAL  Final    Radiology Reports Dg Chest 2 View  Result Date: 01/22/2017 CLINICAL DATA:  Fever. EXAM: CHEST  2 VIEW COMPARISON:  12/08/2016 FINDINGS: Chronic interstitial coarsening accentuated by low volumes. Negative for pneumonia or edema. Borderline cardiomegaly. Stable aortic tortuosity. IMPRESSION: Negative for pneumonia. Electronically Signed   By: Marnee SpringJonathon  Watts M.D.   On:  01/22/2017 18:01   Ct Angio Chest Pe W/cm &/or Wo Cm  Result Date: 01/22/2017 CLINICAL DATA:  Short of breath, cough EXAM: CT ANGIOGRAPHY CHEST WITH CONTRAST TECHNIQUE: Multidetector CT imaging of the chest was performed using the standard protocol during bolus administration of intravenous contrast. Multiplanar CT image reconstructions and MIPs were obtained to evaluate the vascular anatomy. CONTRAST:  70mL ISOVUE-370 IOPAMIDOL (ISOVUE-370) INJECTION 76% COMPARISON:  01/22/2017, CT 07/28/2014 FINDINGS: Cardiovascular: Satisfactory opacification of the pulmonary arteries to the segmental level. No evidence of pulmonary embolism. Normal heart size. No pericardial effusion. Ascending aortic aneurysm, measuring up to 4.3 cm. No dissection. Aortic atherosclerosis. Coronary artery calcification. Mediastinum/Nodes: Midline trachea. Mild debris in the trachea. No thyroid mass. No significant mediastinal adenopathy. Esophagus within normal limits Lungs/Pleura: Stable 5 mm subpleural nodule in the lingula, series 7, image number 74. No change since 2016 and therefore felt benign. No pleural effusion, pneumothorax or consolidation Upper Abdomen: Calcified gallstones.  No acute abnormality. Musculoskeletal: Degenerative changes. No acute or suspicious lesion Review of the MIP images confirms the above findings. IMPRESSION: 1. Negative for acute pulmonary embolus or aortic dissection 2. Stable ascending aortic aneurysm measuring up to 4.3 cm. Recommend annual imaging followup by CTA or MRA. This recommendation follows 2010 ACCF/AHA/AATS/ACR/ASA/SCA/SCAI/SIR/STS/SVM Guidelines for the Diagnosis and Management of Patients with Thoracic Aortic Disease. Circulation. 2010; 121: [id number] 3. Stable 5 mm pulmonary nodule in the lingula, felt benign given lack of interval change 4. Gallstones Aortic Atherosclerosis (ICD10-I70.0). Electronically Signed   By: Jasmine PangKim  Fujinaga M.D.   On: 01/22/2017 20:59    Lab Data:  CBC: Recent  Labs  Lab 01/22/17 1720  WBC 14.9*  NEUTROABS 13.2*  HGB 15.5  HCT 45.3  MCV 94.2  PLT 206   Basic Metabolic Panel: Recent Labs  Lab 01/22/17 1720 01/24/17 0550  NA 138  --   K 3.8  --   CL 102  --  CO2 28  --   GLUCOSE 156*  --   BUN 34*  --   CREATININE 1.81* 0.98  CALCIUM 9.4  --    GFR: Estimated Creatinine Clearance: 62 mL/min (by C-G formula based on SCr of 0.98 mg/dL). Liver Function Tests: Recent Labs  Lab 01/22/17 1720  AST 83*  ALT 34  ALKPHOS 84  BILITOT 1.3*  PROT 7.1  ALBUMIN 3.9   Recent Labs  Lab 01/22/17 1720  LIPASE 21   No results for input(s): AMMONIA in the last 168 hours. Coagulation Profile: No results for input(s): INR, PROTIME in the last 168 hours. Cardiac Enzymes: No results for input(s): CKTOTAL, CKMB, CKMBINDEX, TROPONINI in the last 168 hours. BNP (last 3 results) No results for input(s): PROBNP in the last 8760 hours. HbA1C: No results for input(s): HGBA1C in the last 72 hours. CBG: Recent Labs  Lab 01/23/17 1137 01/23/17 1723 01/23/17 2151 01/24/17 0737 01/24/17 1145  GLUCAP 101* 90 80 80 76   Lipid Profile: No results for input(s): CHOL, HDL, LDLCALC, TRIG, CHOLHDL, LDLDIRECT in the last 72 hours. Thyroid Function Tests: No results for input(s): TSH, T4TOTAL, FREET4, T3FREE, THYROIDAB in the last 72 hours. Anemia Panel: No results for input(s): VITAMINB12, FOLATE, FERRITIN, TIBC, IRON, RETICCTPCT in the last 72 hours. Urine analysis:    Component Value Date/Time   COLORURINE YELLOW 01/22/2017 2137   APPEARANCEUR CLEAR 01/22/2017 2137   APPEARANCEUR Cloudy (A) 08/21/2013 1345   LABSPEC >1.046 (H) 01/22/2017 2137   PHURINE 5.0 01/22/2017 2137   GLUCOSEU NEGATIVE 01/22/2017 2137   HGBUR SMALL (A) 01/22/2017 2137   HGBUR 1+ 03/21/2009 1105   BILIRUBINUR NEGATIVE 01/22/2017 2137   BILIRUBINUR small (A) 12/08/2016 1508   BILIRUBINUR Large 10/09/2014 1549   BILIRUBINUR Negative 08/21/2013 1345   KETONESUR  NEGATIVE 01/22/2017 2137   PROTEINUR NEGATIVE 01/22/2017 2137   UROBILINOGEN 0.2 12/08/2016 1508   UROBILINOGEN 1.0 11/04/2012 1458   NITRITE NEGATIVE 01/22/2017 2137   LEUKOCYTESUR TRACE (A) 01/22/2017 2137   LEUKOCYTESUR 1+ (A) 08/21/2013 1345     OSEI-BONSU,Kelia Gibbon M.D. [facility] 01/24/2017, 12:18 PM  Pager: [phone] Between 7am to 7pm - call Pager - [phone]  After 7pm go to [email or web address] - password [email or web address]  Call night coverage person covering after 7pm

## 2017-01-25 DIAGNOSIS — Z7189 Other specified counseling: Secondary | ICD-10-CM

## 2017-01-25 DIAGNOSIS — E1122 Type 2 diabetes mellitus with diabetic chronic kidney disease: Secondary | ICD-10-CM

## 2017-01-25 DIAGNOSIS — Z515 Encounter for palliative care: Secondary | ICD-10-CM

## 2017-01-25 LAB — CBC
HEMATOCRIT: 37.8 % — AB (ref 39.0–52.0)
HEMOGLOBIN: 12.5 g/dL — AB (ref 13.0–17.0)
MCH: 30.9 pg (ref 26.0–34.0)
MCHC: 33.1 g/dL (ref 30.0–36.0)
MCV: 93.3 fL (ref 78.0–100.0)
PLATELETS: 167 10*3/uL (ref 150–400)
RBC: 4.05 MIL/uL — ABNORMAL LOW (ref 4.22–5.81)
RDW: 14 % (ref 11.5–15.5)
WBC: 5.1 10*3/uL (ref 4.0–10.5)

## 2017-01-25 LAB — BASIC METABOLIC PANEL
Anion gap: 8 (ref 5–15)
BUN: 17 mg/dL (ref 6–20)
CALCIUM: 8.1 mg/dL — AB (ref 8.9–10.3)
CO2: 24 mmol/L (ref 22–32)
Chloride: 108 mmol/L (ref 101–111)
Creatinine, Ser: 0.9 mg/dL (ref 0.61–1.24)
GFR calc Af Amer: >60 mL/min (ref >60)
Glucose, Bld: 108 mg/dL — ABNORMAL HIGH (ref 65–99)
POTASSIUM: 2.9 mmol/L — AB (ref 3.5–5.1)
SODIUM: 140 mmol/L (ref 135–145)

## 2017-01-25 LAB — GLUCOSE, CAPILLARY
GLUCOSE-CAPILLARY: 101 mg/dL — AB (ref 65–99)
Glucose-Capillary: 105 mg/dL — ABNORMAL HIGH (ref 65–99)
Glucose-Capillary: 225 mg/dL — ABNORMAL HIGH (ref 65–99)
Glucose-Capillary: 142 mg/dL — ABNORMAL HIGH (ref 65–99)
Glucose-Capillary: 127 mg/dL — ABNORMAL HIGH (ref 65–99)

## 2017-01-25 LAB — MAGNESIUM: Magnesium: 1.2 mg/dL — ABNORMAL LOW (ref 1.7–2.4)

## 2017-01-25 LAB — LEGIONELLA PNEUMOPHILA SEROGP 1 UR AG: L. PNEUMOPHILA SEROGP 1 UR AG: NEGATIVE

## 2017-01-25 MED ORDER — POTASSIUM CHLORIDE CRYS ER 20 MEQ PO TBCR
40.0000 meq | EXTENDED_RELEASE_TABLET | ORAL | Status: AC
  Administered 2017-01-25 (×3): 40 meq via ORAL
  Filled 2017-01-25 (×3): qty 2

## 2017-01-25 MED ORDER — AMOXICILLIN-POT CLAVULANATE 500-125 MG PO TABS
1.0000 | ORAL_TABLET | Freq: Two times a day (BID) | ORAL | Status: DC
  Administered 2017-01-25 – 2017-01-27 (×5): 500 mg via ORAL
  Filled 2017-01-25 (×5): qty 1

## 2017-01-25 NOTE — Progress Notes (Signed)
[facility] PROGRESS NOTE  Nicholas Barnett [id number] DOB: 12-18-1931 DOA: 01/22/2017 PCP: Kermit Baloeed, Tiffany L, DO  Interim summary and HPI 83 y.o.malewith medical history significant ofParkinson's disease, difficulty swallowing, hypertension, hyperlipidemia, diabetes mellitus, GERD, hypothyroidism, depression, CKD-2, chronic back pain, diverticulitis, bifascicular block, who presents with fever, cough and shortness of breath. Pt was found to haveWBC 14.9, lactic acid is 3.2, 1.23, worsening renal function, temperature normal, no tachycardia, has tachypnea, negative chest x-ray. CT angiograms negative for PE.   Assessment/Plan: Acute resp failure with hypoxia and sepsis: due to aspiration PNA. -patient sepsis features resolved -in no acute distress now -tolerating dysphagia 2 diet and thin liquids -will transition antibiotics to PO (using augmentin) -palliative care consulted -continue PRN nebulizer -follow stability   HLD -continue statins  Parkinson's disease -continue sinemet  Acute on chronic renal failure: stage 2 at baseline -due to dehydration and sepsis -improved/resolved with IVF's -renal function back to baseline  -continue minimizing/avoiding nephrotoxic agents.  Type 2 diabetes -continue SSI while inpatient -last A1C 5.2 -hold oral hypoglycemic regimen   GERD -continue PPI  HTN -continue amlodipine     Code Status: DNR Family Communication: wife and daughters at bedside  Disposition Plan: to be determine; change abx's to PO; follow GOC meeting results; PT to evaluate patient.   Consultants:  Palliative care  Procedures:  See below for x-ray reports   Antibiotics:  vanc and zosyn 11/30>>>12/3  augmentin 12/3  HPI/Subjective: Afebrile, no CP, no nausea, no vomiting. Good O2 sat on RA.  Objective: Vitals:   01/25/17 0430 01/25/17 1358  BP: (!) 153/73 136/77  Pulse: 61 71  Resp: 18 18  Temp: 98.9 F (37.2 C) (!) 97.4 F (36.3  C)  SpO2: 94% 96%    Intake/Output Summary (Last 24 hours) at 01/25/2017 2207 Last data filed at 01/25/2017 1554 Gross per 24 hour  Intake 240 ml  Output 2300 ml  Net -2060 ml   Filed Weights   01/22/17 2338  Weight: 76.7 kg (169 lb 1.5 oz)    Exam:   General: no fever, no CP, tolerating dysphagia 2 diet and is currently in no distress.  Cardiovascular: S1 and S2, no rubs, no gallops, no JVD  Respiratory: improved air movement, no wheezing, no using accessory muscles; positive rhonchi.  Abdomen: soft, NT, ND, positive BS  Musculoskeletal: no edema, no cyanosis, no clubbing   Data Reviewed: Basic Metabolic Panel: Recent Labs  Lab 01/22/17 1720 01/24/17 0550 01/25/17 0535  NA 138  --  140  K 3.8  --  2.9*  CL 102  --  108  CO2 28  --  24  GLUCOSE 156*  --  108*  BUN 34*  --  17  CREATININE 1.81* 0.98 0.90  CALCIUM 9.4  --  8.1*  MG  --   --  1.2*   Liver Function Tests: Recent Labs  Lab 01/22/17 1720  AST 83*  ALT 34  ALKPHOS 84  BILITOT 1.3*  PROT 7.1  ALBUMIN 3.9   Recent Labs  Lab 01/22/17 1720  LIPASE 21   CBC: Recent Labs  Lab 01/22/17 1720 01/25/17 0535  WBC 14.9* 5.1  NEUTROABS 13.2*  --   HGB 15.5 12.5*  HCT 45.3 37.8*  MCV 94.2 93.3  PLT 206 167   BNP (last 3 results) Recent Labs    01/22/17 1720  BNP 216.2*    CBG: Recent Labs  Lab 01/24/17 1637 01/24/17 2101 01/25/17 0734 01/25/17 1218 01/25/17 1621  GLUCAP 142* 174* 101* 225* 142*    Recent Results (from the past 240 hour(s))  Culture, blood (Routine X 2) w Reflex to ID Panel     Status: None (Preliminary result)   Collection Time: 01/22/17  6:20 PM  Result Value Ref Range Status   Specimen Description BLOOD RIGHT ANTECUBITAL  Final   Special Requests   Final    BOTTLES DRAWN AEROBIC AND ANAEROBIC Blood Culture adequate volume   Culture   Final    NO GROWTH 3 DAYS Performed at [facility], [address] [place], [place] [address]    Report Status  PENDING  Incomplete  Culture, blood (Routine X 2) w Reflex to ID Panel     Status: None (Preliminary result)   Collection Time: 01/22/17  6:25 PM  Result Value Ref Range Status   Specimen Description BLOOD LEFT HAND  Final   Special Requests IN PEDIATRIC BOTTLE Blood Culture adequate volume  Final   Culture   Final    NO GROWTH 3 DAYS Performed at [facility], [address] [place], [place] [address]    Report Status PENDING  Incomplete  Urine Culture     Status: Abnormal   Collection Time: 01/22/17  9:38 PM  Result Value Ref Range Status   Specimen Description URINE, CATHETERIZED  Final   Special Requests NONE  Final   Culture (A)  Final    <10,000 COLONIES/mL INSIGNIFICANT GROWTH Performed at [facility], [address] [place], [place] [address]    Report Status 01/24/2017 FINAL  Final     Studies: No results found.  Scheduled Meds: . amLODipine  5 mg Oral Daily  . amoxicillin-clavulanate  1 tablet Oral Q12H  . aspirin EC  81 mg Oral Daily  . calcium-vitamin D  1 tablet Oral BID  . carbidopa-levodopa  3 tablet Oral 2 times per day  . [START ON 02/06/2017] cyanocobalamin  1,000 mcg Intramuscular Q30 days  . dextromethorphan-guaiFENesin  1 tablet Oral BID  . enoxaparin (LOVENOX) injection  40 mg Subcutaneous Q24H  . gabapentin  300 mg Oral QHS  . insulin aspart  0-5 Units Subcutaneous QHS  . insulin aspart  0-9 Units Subcutaneous TID WC  . mouth rinse  15 mL Mouth Rinse BID  . mirtazapine  30 mg Oral QHS  . mupirocin ointment   Topical Daily  . pantoprazole (PROTONIX) IV  40 mg Intravenous QHS  . pravastatin  20 mg Oral q1800  . tamsulosin  0.4 mg Oral QHS   Continuous Infusions: . sodium chloride 75 mL/hr at 01/25/17 0854     Time spent: 30 minutes    Vassie Lollarlos Dreama Kuna  [facility] Pager [phone]. If 7PM-7AM, please contact night-coverage at [email or web address], password [email or web address] 01/25/2017, 10:07 PM  LOS: 3 days

## 2017-01-25 NOTE — Progress Notes (Signed)
PT Cancellation Note  Patient Details Name: Nicholas HalterDavid L Barnett MRN: [id number] DOB: 01/18/1932   Cancelled Treatment:    Reason Eval/Treat Not Completed: Medical issues which prohibited therapy(potassium 2.9, will hold PT until he has received potassium and sinemet. Will follow. )   Tamala SerUhlenberg, Paiton Boultinghouse Kistler 01/25/2017, 9:08 AM [phone]

## 2017-01-25 NOTE — Evaluation (Signed)
Physical Therapy Evaluation Patient Details Name: Nicholas Barnett MRN: [id number] DOB: Aug 25, 1931 Today's Date: 01/25/2017   History of Present Illness  83 y.o. male with medical history significant of Parkinson's disease, difficulty swallowing, hypertension, hyperlipidemia, diabetes mellitus, GERD, hypothyroidism, depression, CKD-2, chronic back pain, diverticulitis, bifascicular block, who presents with fever, cough and shortness of breath. Dx of aspiration pneumonia.  Clinical Impression  Pt admitted with above diagnosis. Pt currently with functional limitations due to the deficits listed below (see PT Problem List). +2 max assist for mobility, wife is agreeable to ST-SNF.  Pt will benefit from skilled PT to increase their independence and safety with mobility to allow discharge to the venue listed below.       Follow Up Recommendations SNF    Equipment Recommendations  None recommended by PT    Recommendations for Other Services       Precautions / Restrictions Precautions Precautions: Fall Precaution Comments: ~12 falls in past 1 year Restrictions Weight Bearing Restrictions: No      Mobility  Bed Mobility               General bed mobility comments: up in recliner  Transfers Overall transfer level: Needs assistance Equipment used: Rolling walker (2 wheeled) Transfers: Sit to/from Stand Sit to Stand: +2 physical assistance;Max assist         General transfer comment: assist to rise and steady, pt stood with RW for 2 minutes for pericare, flexed trunk and neck in standing (family reports this is baseline)  Ambulation/Gait             General Gait Details: unable  Stairs            Wheelchair Mobility    Modified Rankin (Stroke Patients Only)       Balance Overall balance assessment: Needs assistance;History of Falls Sitting-balance support: Bilateral upper extremity supported;Feet supported       Standing balance support: Bilateral  upper extremity supported Standing balance-Leahy Scale: Poor Standing balance comment: requires BUE support                             Pertinent Vitals/Pain Pain Assessment: No/denies pain    Home Living Family/patient expects to be discharged to:: Private residence Living Arrangements: Spouse/significant other Available Help at Discharge: Family;Available 24 hours/day Type of Home: House Home Access: Stairs to enter Entrance Stairs-Rails: Right Entrance Stairs-Number of Steps: 2 Home Layout: Two level;Able to live on main level with bedroom/bathroom Home Equipment: Dan HumphreysWalker - 2 wheels;Walker - 4 wheels;Crutches;Shower seat;Electric scooter;Transport chair;Hospital bed      Prior Function Level of Independence: Needs assistance   Gait / Transfers Assistance Needed: walks short distances with crutches, uses motorized scooter and golf cart  ADL's / Homemaking Assistance Needed: assist for ADLs        Hand Dominance        Extremity/Trunk Assessment   Upper Extremity Assessment Upper Extremity Assessment: Defer to OT evaluation    Lower Extremity Assessment Lower Extremity Assessment: Generalized weakness(knee ext -15* AROM, -4/5 knee ext)    Cervical / Trunk Assessment Cervical / Trunk Assessment: Kyphotic(forward head. trunk flexed)  Communication   Communication: HOH;Expressive difficulties(low volume with speech)  Cognition Arousal/Alertness: Awake/alert Behavior During Therapy: WFL for tasks assessed/performed Overall Cognitive Status: Within Functional Limits for tasks assessed  General Comments      Exercises     Assessment/Plan    PT Assessment Patient needs continued PT services  PT Problem List Decreased strength;Decreased range of motion;Decreased activity tolerance;Decreased balance;Decreased mobility       PT Treatment Interventions DME instruction;Functional mobility  training;Therapeutic activities;Therapeutic exercise;Balance training;Patient/family education    PT Goals (Current goals can be found in the Care Plan section)  Acute Rehab PT Goals Patient Stated Goal: get strong enough to go home PT Goal Formulation: With patient/family Time For Goal Achievement: 02/08/17 Potential to Achieve Goals: Fair    Frequency Min 2X/week   Barriers to discharge        Co-evaluation PT/OT/SLP Co-Evaluation/Treatment: Yes Reason for Co-Treatment: Complexity of the patient's impairments (multi-system involvement);For patient/therapist safety;To address functional/ADL transfers PT goals addressed during session: Mobility/safety with mobility;Balance;Proper use of DME         AM-PAC PT "6 Clicks" Daily Activity  Outcome Measure Difficulty turning over in bed (including adjusting bedclothes, sheets and blankets)?: Unable Difficulty moving from lying on back to sitting on the side of the bed? : Unable Difficulty sitting down on and standing up from a chair with arms (e.g., wheelchair, bedside commode, etc,.)?: Unable Help needed moving to and from a bed to chair (including a wheelchair)?: Total Help needed walking in hospital room?: Total Help needed climbing 3-5 steps with a railing? : Total 6 Click Score: 6    End of Session Equipment Utilized During Treatment: Gait belt Activity Tolerance: Patient limited by fatigue Patient left: in chair;with call bell/phone within reach;with family/visitor present Nurse Communication: Mobility status PT Visit Diagnosis: Unsteadiness on feet (R26.81);Repeated falls (R29.6);History of falling (Z91.81);Muscle weakness (generalized) (M62.81);Difficulty in walking, not elsewhere classified (R26.2)    Time: 3329-51881206-1231 PT Time Calculation (min) (ACUTE ONLY): 25 min   Charges:   PT Evaluation $PT Eval Moderate Complexity: 1 Mod     PT G Codes:           Tamala SerUhlenberg, Desteny Freeman Kistler 01/25/2017, 12:49  PM [phone]

## 2017-01-25 NOTE — Consult Note (Signed)
Consultation Note Date: 01/25/2017   Patient Name: Nicholas Barnett  DOB: 1931/07/22  MRN: [id number]  Age / Sex: 83 y.o., male  PCP: Gayland Curry, DO Referring Physician: Barton Dubois, MD  Reason for Consultation: Establishing goals of care  HPI/Patient Profile: 83 y.o. male  with past medical history of Parkinson's, dysphagia, aspiration, mild dementia, HTN, HLD, DM, GERD, hypothyroidism, depression, CKD 2, chronic back pain, diverticulitis, and bifascicular block admitted on 01/22/2017 with weakness, fever, and chills. In ED, he was found to be hypoxic, short of breath, and coughing up green sputum. Being treated for likely aspiration pneumonia secondary to Parkinson's disease. Labs today show improved kidney function, resolved leukocytosis, and resolved lactic acidosis.   Patient lives at home with his wife. At baseline, patient is partially dependent with ADLs.  Clinical Assessment and Goals of Care: Met with patient, wife, Jacqlyn Larsen, and daughter, Judeen Hammans at bedside. Patient tells me he feels better, denies pain, but unable to participate in goals of care conversation due to mental status.  We thoroughly discussed patient's baseline level of function and level of support wife provides to patient for ADLs - needs maximum assistance with all ADLs. Discussed disposition plan, family's main goal is for patient to return home. They are agreeable to rehab placement from hospital. We discussed that once patient's rehab is complete, Hospice is an option for extra support at home. They were grateful for information and will make decision for Hospice once they see how patient progresses with rehab.   Their biggest concern is patient's mobility. He is too weak at this time to return home with his wife as his caregiver and so they are hopeful he will improve enough with rehab to return home.   We also discussed patient's dysphagia. They understand that this  is progressive and recurrent pneumonia is likely due to aspiration. They are hopeful to prevent aspiration with techniques taught by speech therapy, but also realistic that aspiration is likely. We discussed that at some point they may decide to focus on patient's comfort instead of aggressive medical care for aspiration pneumonia. They understand this, but are not yet ready to say that they would not want patient readmitted for treatment of pneumonia.   Primary Decision Maker NEXT OF KIN wife Becky    SUMMARY OF RECOMMENDATIONS   - DNR - please write for palliative follow up at SNF on discharge as family may use Hospice on discharge from rehab -continue current diet - family understands and accepts aspiration risks  Code Status/Advance Care Planning:  DNR   Symptom Management:   none  Palliative Prophylaxis:   Aspiration, Delirium Protocol, Frequent Pain Assessment and Oral Care  Additional Recommendations (Limitations, Scope, Preferences):  DNR, otherwise full treatment  Psycho-social/Spiritual:   Desire for further Chaplaincy support:no  Additional Recommendations: Education on Hospice  Prognosis:   < 6 months  Discharge Planning: [place] for rehab with Palliative care service follow-up      Primary Diagnoses: Present on Admission: . Parkinson's plus syndrome ([place]) . Hyperlipemia . GERD (gastroesophageal reflux disease) . Essential hypertension . Depression, major . Sepsis ([place]) . Acute respiratory failure with hypoxia ([place]) . Type II diabetes mellitus with renal manifestations ([place]) . Acute renal failure superimposed on stage 2 chronic kidney disease ([place]) . Aspiration pneumonia ([place])   I have reviewed the medical record, interviewed the patient and family, and examined the patient. The following aspects are pertinent.  Past Medical History:  Diagnosis Date  . Abnormality of  gait   . Arthritis   . Bifascicular block   .  Cyanocobalamin deficiency   . Depression   . Diverticulitis   . Dizzy   . Dysphagia, idiopathic    History of aspiration  . Gait disturbance   . Hearing loss of both ears   . Hyperlipidemia   . Hypertension   . Lumbar back pain   . Memory loss   . Neck pain   . Osteoarthritis   . Osteoporosis   . Other and unspecified hyperlipidemia   . Parkinsonism ([place])   . Recurrent falls    Using a cane and walker  . Restless leg   . Restless legs syndrome (RLS)   . Right knee pain   . Shoulder pain   . Thoracic back pain   . Thyroid disease   . Type 2 diabetes mellitus with autonomic neuropathy ([place])   . Unspecified essential hypertension   . Unspecified hypothyroidism   . Vitamin D deficiency    Social History   Socioeconomic History  . Marital status: Married    Spouse name: Inez Catalina  . Number of children: 3  . Years of education: College  . Highest education level: None  Social Needs  . Financial resource strain: None  . Food insecurity - worry: None  . Food insecurity - inability: None  . Transportation needs - medical: None  . Transportation needs - non-medical: None  Occupational History  . Occupation: Retired    Fish farm manager: RETIRED    Comment: Manager/ [employer]  . Smoking status: Former Smoker    Last attempt to quit: 01/31/1974    Years since quitting: 43.0  . Smokeless tobacco: Never Used  Substance and Sexual Activity  . Alcohol use: No    Alcohol/week: 0.0 oz  . Drug use: No  . Sexual activity: No  Other Topics Concern  . None  Social History Narrative   Married 1956. 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   Patient lives at home with his family.   Caffeine Use: none   Patient has a living will and healthcare power of attorney.   Family History  Problem Relation Age of Onset  . Heart disease Mother   . Diabetes Mother   . Stroke Mother   . Cancer Father        prostate  . Diabetes Sister   . Heart disease Brother    Scheduled Meds: .  amLODipine  5 mg Oral Daily  . amoxicillin-clavulanate  1 tablet Oral Q12H  . aspirin EC  81 mg Oral Daily  . calcium-vitamin D  1 tablet Oral BID  . carbidopa-levodopa  3 tablet Oral 2 times per day  . [START ON 02/06/2017] cyanocobalamin  1,000 mcg Intramuscular Q30 days  . dextromethorphan-guaiFENesin  1 tablet Oral BID  . enoxaparin (LOVENOX) injection  40 mg Subcutaneous Q24H  . gabapentin  300 mg Oral QHS  . insulin aspart  0-5 Units Subcutaneous QHS  . insulin aspart  0-9 Units Subcutaneous TID WC  . mouth rinse  15 mL Mouth Rinse BID  . mirtazapine  30 mg Oral QHS  . mupirocin ointment   Topical Daily  . pantoprazole (PROTONIX) IV  40 mg Intravenous QHS  . potassium chloride  40 mEq Oral Q4H  . pravastatin  20 mg Oral q1800  . tamsulosin  0.4 mg Oral QHS   Continuous Infusions: . sodium chloride 75 mL/hr at 01/25/17 0854   PRN Meds:.acetaminophen, albuterol, hydrALAZINE,  ondansetron, RESOURCE THICKENUP CLEAR, zolpidem Allergies  Allergen Reactions  . Caffeine Swelling and Other (See Comments)    Reaction:  Joint swelling   Review of Systems  Constitutional: Positive for activity change, appetite change and unexpected weight change.  Respiratory: Positive for cough. Negative for shortness of breath.   Cardiovascular: Negative for chest pain and leg swelling.  Genitourinary:       Incontinence   Neurological: Positive for weakness.  Psychiatric/Behavioral: Positive for confusion and decreased concentration.    Physical Exam  Constitutional: No distress.  HENT:  Head: Normocephalic and atraumatic.  Cardiovascular: Normal rate.  Pulmonary/Chest: Effort normal. No respiratory distress. He has decreased breath sounds in the right upper field, the right lower field, the left upper field and the left lower field.  Abdominal: Soft. There is no tenderness.  Musculoskeletal:       Right lower leg: He exhibits no edema.       Left lower leg: He exhibits no edema.    Neurological: He is alert. He is disoriented.  Skin: Skin is warm and dry. He is not diaphoretic.  Psychiatric: His speech is delayed. He is withdrawn. Cognition and memory are impaired.    Vital Signs: BP 136/77 (BP Location: Left Arm)   Pulse 71   Temp (!) 97.4 F (36.3 C) (Oral)   Resp 18   Ht _0  (1.854 m)   Wt 76.7 kg (169 lb 1.5 oz)   SpO2 96%   BMI 22.31 kg/m  Pain Assessment: No/denies pain   Pain Score: 0-No pain   SpO2: SpO2: 96 % O2 Device:SpO2: 96 % O2 Flow Rate: .O2 Flow Rate (L/min): 2 L/min  IO: Intake/output summary:   Intake/Output Summary (Last 24 hours) at 01/25/2017 1513 Last data filed at 01/25/2017 1125 Gross per 24 hour  Intake 360 ml  Output 2300 ml  Net -1940 ml    LBM: Last BM Date: 01/25/17 Baseline Weight: Weight: 76.7 kg (169 lb 1.5 oz) Most recent weight: Weight: 76.7 kg (169 lb 1.5 oz)     Palliative Assessment/Data: 40%      Time Total: 50 minutes Greater than 50%  of this time was spent counseling and coordinating care related to the above assessment and plan.  Juel Burrow, DNP, AGNP-C Palliative Medicine Team [phone]

## 2017-01-25 NOTE — Evaluation (Signed)
Occupational Therapy Evaluation Patient Details Name: Nicholas HalterDavid L Barnett MRN: [id number] DOB: 01-Mar-1931 Today's Date: 01/25/2017    History of Present Illness 83 y.o. male with medical history significant of Parkinson's disease, difficulty swallowing, hypertension, hyperlipidemia, diabetes mellitus, GERD, hypothyroidism, depression, CKD-2, chronic back pain, diverticulitis, bifascicular block, who presents with fever, cough and shortness of breath. Dx of aspiration pneumonia.   Clinical Impression   Pt admitted with aspiration pneumonia Pt currently with functional limitations due to the deficits listed below (see OT Problem List). Pt will benefit from skilled OT to increase their safety and independence with ADL and functional mobility for ADL to facilitate discharge to venue listed below.      Follow Up Recommendations  SNF    Equipment Recommendations  Other (comment)       Precautions / Restrictions Precautions Precautions: Fall Precaution Comments: ~12 falls in past 1 year Restrictions Weight Bearing Restrictions: No      Mobility Bed Mobility               General bed mobility comments: up in recliner  Transfers Overall transfer level: Needs assistance Equipment used: Rolling walker (2 wheeled) Transfers: Sit to/from Stand Sit to Stand: +2 physical assistance;Max assist         General transfer comment: assist to rise and steady, pt stood with RW for 2 minutes for pericare, flexed trunk and neck in standing (family reports this is baseline)    Balance Overall balance assessment: Needs assistance;History of Falls Sitting-balance support: Bilateral upper extremity supported;Feet supported       Standing balance support: Bilateral upper extremity supported Standing balance-Leahy Scale: Poor Standing balance comment: requires BUE support                           ADL either performed or assessed with clinical judgement   ADL Overall ADL's :  Needs assistance/impaired Eating/Feeding: Set up;Sitting   Grooming: Minimal assistance;Sitting   Upper Body Bathing: Moderate assistance;Sitting   Lower Body Bathing: +2 for physical assistance;+2 for safety/equipment;Sit to/from stand;Total assistance   Upper Body Dressing : Minimal assistance;Sitting   Lower Body Dressing: Sit to/from stand;Total assistance;+2 for safety/equipment;+2 for physical assistance                       Vision Patient Visual Report: No change from baseline              Pertinent Vitals/Pain Pain Assessment: No/denies pain        Extremity/Trunk Assessment Upper Extremity Assessment Upper Extremity Assessment: Generalized weakness   Lower Extremity Assessment Lower Extremity Assessment: Generalized weakness(knee ext -15* AROM, -4/5 knee ext)   Cervical / Trunk Assessment Cervical / Trunk Assessment: Kyphotic(forward head. trunk flexed)   Communication Communication Communication: HOH;Expressive difficulties(low volume with speech)   Cognition Arousal/Alertness: Awake/alert Behavior During Therapy: WFL for tasks assessed/performed Overall Cognitive Status: Within Functional Limits for tasks assessed                                                Home Living Family/patient expects to be discharged to:: Private residence Living Arrangements: Spouse/significant other Available Help at Discharge: Family;Available 24 hours/day Type of Home: House Home Access: Stairs to enter Entergy CorporationEntrance Stairs-Number of Steps: 2 Entrance Stairs-Rails: Right Home Layout: Two level;Able to live on  main level with bedroom/bathroom               Home Equipment: Walker - 2 wheels;Walker - 4 wheels;Crutches;Shower seat;Electric scooter;Transport chair;Hospital bed          Prior Functioning/Environment Level of Independence: Needs assistance  Gait / Transfers Assistance Needed: walks short distances with crutches, uses  motorized scooter and golf cart ADL's / Homemaking Assistance Needed: assist for ADLs            OT Problem List: Decreased strength;Decreased activity tolerance;Impaired balance (sitting and/or standing);Decreased safety awareness;Decreased knowledge of use of DME or AE      OT Treatment/Interventions: Self-care/ADL training;Patient/family education;DME and/or AE instruction    OT Goals(Current goals can be found in the care plan section) Acute Rehab OT Goals Patient Stated Goal: get strong enough to go home OT Goal Formulation: With patient Time For Goal Achievement: 02/08/17 Potential to Achieve Goals: Good  OT Frequency: Min 2X/week   Barriers to D/C: Decreased caregiver support          Co-evaluation PT/OT/SLP Co-Evaluation/Treatment: Yes Reason for Co-Treatment: Complexity of the patient's impairments (multi-system involvement) PT goals addressed during session: Mobility/safety with mobility;Balance;Proper use of DME OT goals addressed during session: ADL's and self-care      AM-PAC PT "6 Clicks" Daily Activity     Outcome Measure Help from another person eating meals?: A Little Help from another person taking care of personal grooming?: A Little Help from another person toileting, which includes using toliet, bedpan, or urinal?: Total Help from another person bathing (including washing, rinsing, drying)?: Total Help from another person to put on and taking off regular upper body clothing?: A Lot Help from another person to put on and taking off regular lower body clothing?: Total 6 Click Score: 11   End of Session Equipment Utilized During Treatment: Rolling walker Nurse Communication: Mobility status  Activity Tolerance: Patient limited by fatigue Patient left: in chair;with call bell/phone within reach;with chair alarm set;with nursing/sitter in room;with family/visitor present  OT Visit Diagnosis: Unsteadiness on feet (R26.81);Repeated falls (R29.6);Muscle  weakness (generalized) (M62.81);History of falling (Z91.81);Other abnormalities of gait and mobility (R26.89)                Time: 1610-96041206-1231 OT Time Calculation (min): 25 min Charges:  OT Evaluation $OT Eval Moderate Complexity: 1 Mod G-Codes:     Lise AuerLori Khloi Rawl, OT [phone]  Einar CrowEDDING, Liya Strollo D 01/25/2017, 1:17 PM

## 2017-01-25 NOTE — Care Management Important Message (Signed)
Important Message  Patient Details  Name: Nicholas Barnett MRN: [id number] Date of Birth: 08-Nov-1931   Medicare Important Message Given:  Yes    Caren MacadamFuller, Alleah Dearman 01/25/2017, 11:00 AMImportant Message  Patient Details  Name: Nicholas Barnett MRN: [id number] Date of Birth: 08-Nov-1931   Medicare Important Message Given:  Yes    Caren MacadamFuller, Ireland Chagnon 01/25/2017, 11:00 AM

## 2017-01-25 NOTE — Progress Notes (Signed)
Date:  January 25, 2017 Chart reviewed for concurrent status and case management needs.  Will continue to follow patient progress.  Discharge Planning: following for needs  Expected discharge date: January 28, 2017  Tiona Ruane, BSN, RN3, CCM   [phone]  

## 2017-01-26 LAB — GLUCOSE, CAPILLARY
GLUCOSE-CAPILLARY: 117 mg/dL — AB (ref 65–99)
Glucose-Capillary: 184 mg/dL — ABNORMAL HIGH (ref 65–99)
Glucose-Capillary: 154 mg/dL — ABNORMAL HIGH (ref 65–99)
Glucose-Capillary: 166 mg/dL — ABNORMAL HIGH (ref 65–99)

## 2017-01-26 LAB — BASIC METABOLIC PANEL
Anion gap: 5 (ref 5–15)
BUN: 12 mg/dL (ref 6–20)
CALCIUM: 8.5 mg/dL — AB (ref 8.9–10.3)
CO2: 26 mmol/L (ref 22–32)
CREATININE: 0.93 mg/dL (ref 0.61–1.24)
Chloride: 108 mmol/L (ref 101–111)
GFR calc Af Amer: >60 mL/min (ref >60)
Glucose, Bld: 120 mg/dL — ABNORMAL HIGH (ref 65–99)
Potassium: 3.4 mmol/L — ABNORMAL LOW (ref 3.5–5.1)
SODIUM: 139 mmol/L (ref 135–145)

## 2017-01-26 MED ORDER — POTASSIUM CHLORIDE CRYS ER 20 MEQ PO TBCR
40.0000 meq | EXTENDED_RELEASE_TABLET | Freq: Once | ORAL | Status: DC
  Filled 2017-01-26: qty 2

## 2017-01-26 NOTE — Progress Notes (Signed)
[facility] PROGRESS NOTE  Nicholas HalterDavid L Heaslip FAO:[id number]:[id number] DOB: 10-01-1931 DOA: 01/22/2017 PCP: Kermit Baloeed, Tiffany L, DO  Interim summary and HPI 83 y.o.malewith medical history significant ofParkinson's disease, difficulty swallowing, hypertension, hyperlipidemia, diabetes mellitus, GERD, hypothyroidism, depression, CKD-2, chronic back pain, diverticulitis, bifascicular block, who presents with fever, cough and shortness of breath. Pt was found to haveWBC 14.9, lactic acid is 3.2, 1.23, worsening renal function, temperature normal, no tachycardia, has tachypnea, negative chest x-ray. CT angiograms negative for PE.   Assessment/Plan: Acute resp failure with hypoxia and sepsis: due to aspiration PNA. -patient sepsis features resolved -In no acute distress and currently alert, awake and oriented x3. -Patient tolerating dysphagia 2 diet with thin liquids -Antibiotic has been transitioned to Augmentin and so far pretty well tolerated. -Appreciate goals of care discussion with palliative care team: Patient is DNR/DNI with intention to go to a skilled nursing facility with palliative care following him and anticipated discharge home with hospice. -Continue otherwise current active care. -Continue as needed nebulizer  HLD -will continue statins  Parkinson's disease -Continue Sinemet  Acute on chronic renal failure: stage 2 at baseline -due to dehydration and sepsis -Resolved with IV fluid resuscitation -Renal function is back to normal -Continue to monitor intermittently.   -Minimize the use of nephrotoxic agents  Type 2 diabetes -A1c 5.2 -Continue holding hypoglycemic regimen as an inpatient -Continue sliding scale insulin -He might not require any medication for blood sugar control at discharge.  GERD -Continue PPI  HTN -Continue amlodipine  Physical deconditioning -Family concern that they cannot take care of the patient right away given his increased deconditioning and  weakness. -Physical therapy recommending SNF -Patient currently refusing to go to any facility; social worker has already made a search for a facility and the family will discuss with the patient with anticipated discharge tomorrow (either to a skilled nursing facility with palliative care following him versus discharge home with set up for hospice to follow him along).   Code Status: DNR Family Communication: wife and daughters at bedside  Disposition Plan: Physical therapy has recommended skilled nursing facility and goals of care meeting delineate discharge to a skilled nursing facility with palliative care following patient and anticipated future transition home with hospice.  Patient currently declining skilled nursing facility.   Consultants:  Palliative care  Procedures:  See below for x-ray reports   Antibiotics:  vanc and zosyn 11/30>>>12/3  augmentin 12/3  HPI/Subjective: Feeling much better.  Afebrile and denying chest pain.  There is no nausea vomiting.  Still with mild shortness of breath but no using accessory muscles and with good saturation on room air.  Objective: Vitals:   01/26/17 0517 01/26/17 1341  BP: (!) 168/90 (!) 154/86  Pulse: 60 80  Resp: 18   Temp: (!) 97.4 F (36.3 C) 97.9 F (36.6 C)  SpO2: 98% 94%    Intake/Output Summary (Last 24 hours) at 01/26/2017 1841 Last data filed at 01/26/2017 1830 Gross per 24 hour  Intake 720 ml  Output 3525 ml  Net -2805 ml   Filed Weights   01/22/17 2338  Weight: 76.7 kg (169 lb 1.5 oz)    Exam:   General: Patient alert, awake and oriented x3; afebrile and in no acute distress.  Interactive and reporting just mild shortness of breath.  No chest pain.    Cardiovascular: S1 and S2, no rubs, no gallops, no JVD.   Respiratory: Scattered rhonchi, no wheezing, no crackles, improved air movement bilaterally.  Normal respiratory effort  and good oxygen saturation on room air.  Abdomen: Soft, nontender,  nondistended, positive bowel sounds.    Musculoskeletal: No edema, no cyanosis, no clubbing.     Data Reviewed: Basic Metabolic Panel: Recent Labs  Lab 01/22/17 1720 01/24/17 0550 01/25/17 0535 01/26/17 0606  NA 138  --  140 139  K 3.8  --  2.9* 3.4*  CL 102  --  108 108  CO2 28  --  24 26  GLUCOSE 156*  --  108* 120*  BUN 34*  --  17 12  CREATININE 1.81* 0.98 0.90 0.93  CALCIUM 9.4  --  8.1* 8.5*  MG  --   --  1.2*  --    Liver Function Tests: Recent Labs  Lab 01/22/17 1720  AST 83*  ALT 34  ALKPHOS 84  BILITOT 1.3*  PROT 7.1  ALBUMIN 3.9   Recent Labs  Lab 01/22/17 1720  LIPASE 21   CBC: Recent Labs  Lab 01/22/17 1720 01/25/17 0535  WBC 14.9* 5.1  NEUTROABS 13.2*  --   HGB 15.5 12.5*  HCT 45.3 37.8*  MCV 94.2 93.3  PLT 206 167   BNP (last 3 results) Recent Labs    01/22/17 1720  BNP 216.2*    CBG: Recent Labs  Lab 01/25/17 1621 01/25/17 2210 01/26/17 0728 01/26/17 1137 01/26/17 1710  GLUCAP 142* 127* 117* 184* 154*    Recent Results (from the past 240 hour(s))  Culture, blood (Routine X 2) w Reflex to ID Panel     Status: None (Preliminary result)   Collection Time: 01/22/17  6:20 PM  Result Value Ref Range Status   Specimen Description BLOOD RIGHT ANTECUBITAL  Final   Special Requests   Final    BOTTLES DRAWN AEROBIC AND ANAEROBIC Blood Culture adequate volume   Culture   Final    NO GROWTH 4 DAYS Performed at [facility], [address] [place], [place] [address]    Report Status PENDING  Incomplete  Culture, blood (Routine X 2) w Reflex to ID Panel     Status: None (Preliminary result)   Collection Time: 01/22/17  6:25 PM  Result Value Ref Range Status   Specimen Description BLOOD LEFT HAND  Final   Special Requests IN PEDIATRIC BOTTLE Blood Culture adequate volume  Final   Culture   Final    NO GROWTH 4 DAYS Performed at [facility], [address] [place], [place] [address]    Report Status PENDING   Incomplete  Urine Culture     Status: Abnormal   Collection Time: 01/22/17  9:38 PM  Result Value Ref Range Status   Specimen Description URINE, CATHETERIZED  Final   Special Requests NONE  Final   Culture (A)  Final    <10,000 COLONIES/mL INSIGNIFICANT GROWTH Performed at [facility], [address] [place], [place] [address]    Report Status 01/24/2017 FINAL  Final     Studies: No results found.  Scheduled Meds: . amLODipine  5 mg Oral Daily  . amoxicillin-clavulanate  1 tablet Oral Q12H  . aspirin EC  81 mg Oral Daily  . calcium-vitamin D  1 tablet Oral BID  . carbidopa-levodopa  3 tablet Oral 2 times per day  . [START ON 02/06/2017] cyanocobalamin  1,000 mcg Intramuscular Q30 days  . dextromethorphan-guaiFENesin  1 tablet Oral BID  . enoxaparin (LOVENOX) injection  40 mg Subcutaneous Q24H  . gabapentin  300 mg Oral QHS  .  insulin aspart  0-5 Units Subcutaneous QHS  . insulin aspart  0-9 Units Subcutaneous TID WC  . mouth rinse  15 mL Mouth Rinse BID  . mirtazapine  30 mg Oral QHS  . mupirocin ointment   Topical Daily  . pantoprazole (PROTONIX) IV  40 mg Intravenous QHS  . pravastatin  20 mg Oral q1800  . tamsulosin  0.4 mg Oral QHS   Continuous Infusions: . sodium chloride 50 mL/hr at 01/26/17 1442     Time spent: 25 minutes    Vassie Loll  [facility] Pager [phone]. If 7PM-7AM, please contact night-coverage at [email or web address], password [facility] 01/26/2017, 6:41 PM  LOS: 4 days

## 2017-01-26 NOTE — Progress Notes (Addendum)
LCSW following for SNF placement.  LCSW faxed patient out to facilities.   Passar # pending in Manual Review. Need MD signature on 30 day note on chart.   LCSW will continue to follow.   Beulah GandyBernette Landrey Mahurin, LSCW [facility] [phone]

## 2017-01-26 NOTE — Clinical Social Work Note (Signed)
Clinical Social Work Assessment  Patient Details  Name: Nicholas Barnett MRN: [id number] Date of Birth: 1931-04-04  Date of referral:  01/26/17               Reason for consult:  Facility Placement, Discharge Planning                Permission sought to share information with:  Case Manager, Customer service manager, Family Supports Permission granted to share information::  Yes, Verbal Permission Granted  Name::        Agency::     Relationship::  Wife  Contact Information:     Housing/Transportation Living arrangements for the past 2 months:  Single Family Home Source of Information:  Patient, Adult Children, Spouse Patient Interpreter Needed:  None Criminal Activity/Legal Involvement Pertinent to Current Situation/Hospitalization:  No - Comment as needed Significant Relationships:  Adult Children, Spouse Lives with:  Spouse Do you feel safe going back to the place where you live?  Yes Need for family participation in patient care:  Yes (Comment)  Care giving concerns:  Patient lives with his spouse who assist with all patients ADLs. Patient is able to transfer himself at baseline. Spouse is not able to assist with transfers. Patient has stairs at home and spouse is not able to assist patient with his current weakness.    Social Worker assessment / plan:  LCSW following for SNF placement.   Patient was admitted to hospital for Acute respiratory failure with hypoxia.  Patient has a history of past medical history of Parkinson's, dysphagia, aspiration, mild dementia, HTN, HLD, DM, GERD, hypothyroidism, depression, CKD 2, chronic back pain, diverticulitis, and bifascicular block  LCSW met with patient at bedside, wife and daughter were present.   LCSW discussed PT recommendations for SNF placement with patient and family. Patient is not agreeable to SNF at the time of assessment. Spouse and daughter are agreeable to SNF, for spouse is primary caretaker and is not able to meet  his needs with current weakness.    Palliative consulted with family 12/3 and recommended SNF with palliative and Hospice after rehab. Palliative noted that patient participation was limited due to mental status.  Patients wife is health care POA.   PLAN: Patients family would like SNF placement. Preferably [facility].    Employment status:  Retired Nurse, adult PT Recommendations:  [place] / Referral to community resources:  [place]  Patient/Family's Response to care:  Patient was not interested in [facility] visit. When asked about SNF patient said he would like to be left alone. Family is appreciative of LCSW visit. LCSW answered family questions.   Patient/Family's Understanding of and Emotional Response to Diagnosis, Current Treatment, and Prognosis:  Family is knowledgeable of patient diagnosis and agreeable to treatment plan. Patient is not agreeable to treatment plan and would like to go home.   Emotional Assessment Appearance:  Appears stated age Attitude/Demeanor/Rapport:  Avoidant Affect (typically observed):  Frustrated, Apprehensive Orientation:  Oriented to Self Alcohol / Substance use:  Not Applicable Psych involvement (Current and /or in the community):  No (Comment)  Discharge Needs  Concerns to be addressed:  No discharge needs identified Readmission within the last 30 days:  No Current discharge risk:  None Barriers to Discharge:  No SNF bed   Servando Snare, LCSW 01/26/2017, 1:59 PM

## 2017-01-26 NOTE — Progress Notes (Signed)
Asked pt's family member earlier in shift about turning him throughout night and they stated they would call when he awakens but would like him to rest otherwise. Matt Delpizzo P Jamaris Biernat

## 2017-01-26 NOTE — NC FL2 (Signed)
[place] MEDICAID FL2 LEVEL OF CARE SCREENING TOOL     IDENTIFICATION  Patient Name: Nicholas Barnett Birthdate: [birth year] Sex: male Admission Date (Current Location): 01/22/2017  [facility] [place]   Facility and Address:  [facility],  [address], [place] [address]      Provider Number: [id number]  Attending Physician Name and Address:  Vassie LollMadera, Carlos, MD  Relative Name and Phone Number:       Current Level of Care: Hospital Recommended Level of Care: Skilled Nursing Facility Prior Approval Number:    Date Approved/Denied:   PASRR Number:    Discharge Plan: SNF    Current Diagnoses: Patient Active Problem List   Diagnosis Date Noted  . Goals of care, counseling/discussion   . Palliative care by specialist   . Sepsis (HCC) 01/22/2017  . Acute respiratory failure with hypoxia (HCC) 01/22/2017  . Type II diabetes mellitus with renal manifestations (HCC) 01/22/2017  . Acute renal failure superimposed on stage 2 chronic kidney disease (HCC) 01/22/2017  . Aspiration pneumonia (HCC) 01/22/2017  . Fever, unspecified 10/16/2015  . Depression, major 10/16/2015  . GERD (gastroesophageal reflux disease) 04/17/2015  . UTI (urinary tract infection) 10/17/2014  . Pain in the chest   . Chest pain 07/28/2014  . Abnormal EKG 07/28/2014  . Insomnia 10/17/2013  . Pain in joint, shoulder region 01/25/2013  . Dysphagia, idiopathic   . Recurrent falls   . Dizzy   . Hearing loss of both ears   . Neck pain   . Right knee pain   . Thoracic back pain   . Lumbar back pain   . Shoulder pain   . Cyanocobalamin deficiency   . Type 2 diabetes mellitus with autonomic neuropathy (HCC)   . Restless legs syndrome (RLS)   . Parkinson's plus syndrome (HCC) 11/24/2012  . Gait difficulty 11/24/2012  . NOCTURIA 05/08/2009  . Memory loss 04/11/2009  . ECZEMA 03/21/2009  . BPH (benign prostatic hyperplasia) 12/25/2008  . OSTEOARTHRITIS, KNEE, RIGHT  12/25/2008  . VITAMIN D DEFICIENCY 12/22/2007  . DIVERTICULITIS, HX OF 06/01/2007  . Hyperlipemia 12/16/2006  . ERECTILE DYSFUNCTION 12/16/2006  . Essential hypertension 12/16/2006    Orientation RESPIRATION BLADDER Height & Weight     Self, Place  Normal Continent Weight: 169 lb 1.5 oz (76.7 kg) Height:  6\' 1"  (185.4 cm)  BEHAVIORAL SYMPTOMS/MOOD NEUROLOGICAL BOWEL NUTRITION STATUS      Continent Diet(See DC summary)  AMBULATORY STATUS COMMUNICATION OF NEEDS Skin   Extensive Assist Verbally Normal                       Personal Care Assistance Level of Assistance  Bathing, Feeding, Dressing Bathing Assistance: Maximum assistance Feeding assistance: Independent Dressing Assistance: Maximum assistance     Functional Limitations Info  Sight, Hearing, Speech Sight Info: Adequate Hearing Info: Impaired Speech Info: Adequate    SPECIAL CARE FACTORS FREQUENCY  PT (By licensed PT), OT (By licensed OT)     PT Frequency: 5x/week OT Frequency: 5x/week            Contractures Contractures Info: Not present    Additional Factors Info  Code Status, Allergies Code Status Info: Full Allergies Info: Caffeine           Current Medications (01/26/2017):  This is the current hospital active medication list Current Facility-Administered Medications  Medication Dose Route Frequency Provider Last Rate Last Dose  . 0.9 %  sodium chloride  infusion   Intravenous Continuous Vassie LollMadera, Carlos, MD 50 mL/hr at 01/26/17 1442    . acetaminophen (TYLENOL) suppository 650 mg  650 mg Rectal Q6H PRN Lorretta HarpNiu, Xilin, MD      . albuterol (PROVENTIL) (2.5 MG/3ML) 0.083% nebulizer solution 2.5 mg  2.5 mg Nebulization Q4H PRN Lorretta HarpNiu, Xilin, MD      . amLODipine (NORVASC) tablet 5 mg  5 mg Oral Daily Lorretta HarpNiu, Xilin, MD   5 mg at 01/26/17 1133  . amoxicillin-clavulanate (AUGMENTIN) 500-125 MG per tablet 500 mg  1 tablet Oral Q12H Vassie LollMadera, Carlos, MD   500 mg at 01/26/17 1134  . aspirin EC tablet 81 mg  81 mg  Oral Daily Lorretta HarpNiu, Xilin, MD   81 mg at 01/26/17 1133  . calcium-vitamin D (OSCAL WITH D) 500-200 MG-UNIT per tablet 1 tablet  1 tablet Oral BID Lorretta HarpNiu, Xilin, MD   1 tablet at 01/26/17 1133  . carbidopa-levodopa (SINEMET IR) 25-100 MG per tablet immediate release 3 tablet  3 tablet Oral 2 times per day Lorretta HarpNiu, Xilin, MD   3 tablet at 01/26/17 1554  . [START ON 02/06/2017] cyanocobalamin ((VITAMIN B-12)) injection 1,000 mcg  1,000 mcg Intramuscular Q30 days Lorretta HarpNiu, Xilin, MD      . dextromethorphan-guaiFENesin [facility](MUCINEX DM) 30-600 MG per 12 hr tablet 1 tablet  1 tablet Oral BID Lorretta HarpNiu, Xilin, MD   1 tablet at 01/26/17 1132  . enoxaparin (LOVENOX) injection 40 mg  40 mg Subcutaneous Q24H Lorretta HarpNiu, Xilin, MD   40 mg at 01/26/17 1133  . gabapentin (NEURONTIN) capsule 300 mg  300 mg Oral QHS Lorretta HarpNiu, Xilin, MD   300 mg at 01/25/17 2215  . hydrALAZINE (APRESOLINE) injection 5 mg  5 mg Intravenous Q2H PRN Lorretta HarpNiu, Xilin, MD      . insulin aspart (novoLOG) injection 0-5 Units  0-5 Units Subcutaneous QHS Lorretta HarpNiu, Xilin, MD      . insulin aspart (novoLOG) injection 0-9 Units  0-9 Units Subcutaneous TID WC Lorretta HarpNiu, Xilin, MD   2 Units at 01/26/17 1140  . MEDLINE mouth rinse  15 mL Mouth Rinse BID Osei-Bonsu, Greggory StallionGeorge, MD   15 mL at 01/26/17 1134  . mirtazapine (REMERON) tablet 30 mg  30 mg Oral QHS Lorretta HarpNiu, Xilin, MD   30 mg at 01/25/17 2215  . mupirocin ointment (BACTROBAN) 2 %   Topical Daily Lorretta HarpNiu, Xilin, MD      . ondansetron [facility](ZOFRAN) injection 4 mg  4 mg Intravenous Q8H PRN Lorretta HarpNiu, Xilin, MD      . pantoprazole (PROTONIX) injection 40 mg  40 mg Intravenous QHS Elray McgregorLynch, Mary A, NP   40 mg at 01/25/17 2215  . pravastatin (PRAVACHOL) tablet 20 mg  20 mg Oral q1800 Lorretta HarpNiu, Xilin, MD   20 mg at 01/25/17 1829  . RESOURCE THICKENUP CLEAR   Oral PRN Osei-Bonsu, Greggory StallionGeorge, MD      . tamsulosin (FLOMAX) capsule 0.4 mg  0.4 mg Oral QHS Lorretta HarpNiu, Xilin, MD   0.4 mg at 01/25/17 2215  . zolpidem (AMBIEN) tablet 5 mg  5 mg Oral QHS PRN Lorretta HarpNiu, Xilin, MD         Discharge  Medications: Please see discharge summary for a list of discharge medications.  Relevant Imaging Results:  Relevant Lab Results:   Additional Information ssn: [id number]  Coralyn HellingBernette Marcha Licklider, LCSW

## 2017-01-26 NOTE — Progress Notes (Signed)
Occupational Therapy Treatment Patient Details Name: Nicholas HalterDavid L Landa MRN: [id number] DOB: [birth year] Today's Date: 01/26/2017    History of present illness 83 y.o. male with medical history significant of Parkinson's disease, difficulty swallowing, hypertension, hyperlipidemia, diabetes mellitus, GERD, hypothyroidism, depression, CKD-2, chronic back pain, diverticulitis, bifascicular block, who presents with fever, cough and shortness of breath. Dx of aspiration pneumonia.   OT comments  Wife and daughter present for tx  Follow Up Recommendations  SNF    Equipment Recommendations  Other (comment)    Recommendations for Other Services      Precautions / Restrictions Precautions Precautions: Fall Precaution Comments: ~12 falls in past 1 year Restrictions Weight Bearing Restrictions: No       Mobility Bed Mobility               General bed mobility comments: up in recliner  Transfers Overall transfer level: Needs assistance Equipment used: Rolling walker (2 wheeled) Transfers: Sit to/from Stand Sit to Stand: Max assist              Balance Overall balance assessment: Needs assistance;History of Falls Sitting-balance support: Bilateral upper extremity supported;Feet supported       Standing balance support: Bilateral upper extremity supported Standing balance-Leahy Scale: Poor Standing balance comment: requires BUE support                           ADL either performed or assessed with clinical judgement   ADL Overall ADL's : Needs assistance/impaired                     Lower Body Dressing: Sit to/from stand;Total assistance;Maximal assistance;Cueing for safety;Cueing for sequencing                 General ADL Comments: OT session focused on sit to stand in preparation for increased I with toileting.  Pt performed 3 sit to stands focusing on hand placement and BLE placement. Pt tended to lean R and posterior with decreased  awareness.       Vision Patient Visual Report: No change from baseline     Perception     Praxis      Cognition Arousal/Alertness: Awake/alert Behavior During Therapy: WFL for tasks assessed/performed Overall Cognitive Status: Within Functional Limits for tasks assessed                                                     Pertinent Vitals/ Pain       Pain Assessment: 0-10 Pain Score: 3  Pain Location: shoulders Pain Descriptors / Indicators: Discomfort Pain Intervention(s): Limited activity within patient's tolerance;Monitored during session     Prior Functioning/Environment              Frequency  Min 2X/week        Progress Toward Goals  OT Goals(current goals can now be found in the care plan section)  Progress towards OT goals: Progressing toward goals            AM-PAC PT "6 Clicks" Daily Activity     Outcome Measure   Help from another person eating meals?: A Little Help from another person taking care of personal grooming?: A Little Help from another person toileting, which includes using toliet, bedpan, or urinal?: Total Help from another  person bathing (including washing, rinsing, drying)?: Total Help from another person to put on and taking off regular upper body clothing?: A Lot Help from another person to put on and taking off regular lower body clothing?: Total 6 Click Score: 11    End of Session Equipment Utilized During Treatment: Rolling walker  OT Visit Diagnosis: Unsteadiness on feet (R26.81);Repeated falls (R29.6);Muscle weakness (generalized) (M62.81);History of falling (Z91.81);Other abnormalities of gait and mobility (R26.89)   Activity Tolerance Patient tolerated treatment well   Patient Left in chair;with call bell/phone within reach;with chair alarm set;with family/visitor present   Nurse Communication Mobility status        Time: [id number] OT Time Calculation (min): 36 min  Charges: OT General  Charges $OT Visit: 1 Visit OT Treatments $Self Care/Home Management : 23-37 mins  WhittemoreLori Crystin Lechtenberg, [place] [phone]   Einar CrowEDDING, Margy Sumler D 01/26/2017, 11:06 AM

## 2017-01-27 DIAGNOSIS — J690 Pneumonitis due to inhalation of food and vomit: Secondary | ICD-10-CM

## 2017-01-27 DIAGNOSIS — N182 Chronic kidney disease, stage 2 (mild): Secondary | ICD-10-CM

## 2017-01-27 DIAGNOSIS — A419 Sepsis, unspecified organism: Secondary | ICD-10-CM | POA: Diagnosis not present

## 2017-01-27 DIAGNOSIS — J9601 Acute respiratory failure with hypoxia: Secondary | ICD-10-CM

## 2017-01-27 DIAGNOSIS — I10 Essential (primary) hypertension: Secondary | ICD-10-CM

## 2017-01-27 DIAGNOSIS — R0902 Hypoxemia: Secondary | ICD-10-CM | POA: Diagnosis not present

## 2017-01-27 DIAGNOSIS — R1312 Dysphagia, oropharyngeal phase: Secondary | ICD-10-CM | POA: Diagnosis not present

## 2017-01-27 DIAGNOSIS — M1711 Unilateral primary osteoarthritis, right knee: Secondary | ICD-10-CM | POA: Diagnosis not present

## 2017-01-27 DIAGNOSIS — N179 Acute kidney failure, unspecified: Secondary | ICD-10-CM | POA: Diagnosis not present

## 2017-01-27 DIAGNOSIS — M6281 Muscle weakness (generalized): Secondary | ICD-10-CM | POA: Diagnosis not present

## 2017-01-27 DIAGNOSIS — R278 Other lack of coordination: Secondary | ICD-10-CM | POA: Diagnosis not present

## 2017-01-27 DIAGNOSIS — G20 Parkinson's disease: Secondary | ICD-10-CM | POA: Diagnosis not present

## 2017-01-27 DIAGNOSIS — M25561 Pain in right knee: Secondary | ICD-10-CM | POA: Diagnosis not present

## 2017-01-27 DIAGNOSIS — R6520 Severe sepsis without septic shock: Secondary | ICD-10-CM | POA: Diagnosis not present

## 2017-01-27 DIAGNOSIS — G232 Striatonigral degeneration: Secondary | ICD-10-CM | POA: Diagnosis not present

## 2017-01-27 DIAGNOSIS — G214 Vascular parkinsonism: Secondary | ICD-10-CM | POA: Diagnosis not present

## 2017-01-27 DIAGNOSIS — R2689 Other abnormalities of gait and mobility: Secondary | ICD-10-CM | POA: Diagnosis not present

## 2017-01-27 DIAGNOSIS — R1319 Other dysphagia: Secondary | ICD-10-CM | POA: Diagnosis not present

## 2017-01-27 DIAGNOSIS — J9600 Acute respiratory failure, unspecified whether with hypoxia or hypercapnia: Secondary | ICD-10-CM | POA: Diagnosis not present

## 2017-01-27 DIAGNOSIS — M25551 Pain in right hip: Secondary | ICD-10-CM | POA: Diagnosis not present

## 2017-01-27 LAB — CULTURE, BLOOD (ROUTINE X 2)
CULTURE: NO GROWTH
Culture: NO GROWTH
Special Requests: ADEQUATE
Special Requests: ADEQUATE

## 2017-01-27 LAB — GLUCOSE, CAPILLARY
GLUCOSE-CAPILLARY: 185 mg/dL — AB (ref 65–99)
Glucose-Capillary: 131 mg/dL — ABNORMAL HIGH (ref 65–99)

## 2017-01-27 MED ORDER — AMOXICILLIN-POT CLAVULANATE 500-125 MG PO TABS: 1.0000 | ORAL_TABLET | Freq: Two times a day (BID) | ORAL | 0 refills | Status: AC

## 2017-01-27 MED ORDER — PANTOPRAZOLE SODIUM 40 MG PO TBEC: 40.0000 mg | DELAYED_RELEASE_TABLET | Freq: Every day | ORAL | Status: DC

## 2017-01-27 MED ORDER — POTASSIUM CHLORIDE CRYS ER 20 MEQ PO TBCR: 40.0000 meq | EXTENDED_RELEASE_TABLET | Freq: Every day | ORAL | 0 refills | Status: DC

## 2017-01-27 NOTE — Progress Notes (Signed)
Report called to Noah at [facility].

## 2017-01-27 NOTE — Discharge Summary (Signed)
Physician Discharge Summary  Suan HalterDavid L Burbridge XLK:[id number] DOB: Mar 01, 1931 DOA: 01/22/2017  PCP: Kermit Baloeed, Tiffany L, DO  Admit date: 01/22/2017 Discharge date: 01/27/2017  Admitted From:home Disposition:SNF  Recommendations for Outpatient Follow-up:  1. Follow up with PCP in 1-2 weeks 2. Please obtain BMP/CBC in one week  Home Health:SNF Equipment/Devices:none Discharge Condition:stable CODE STATUS:DNR Diet recommendation:  Dysphagia 2 (Fine chop);Thin liquid  Medication Administration: Other (Comment)(liquid or crushed with puree) Supervision: Staff to assist with self feeding   Brief/Interim Summary: 83 y.o.malewith medical history significant ofParkinson's disease, difficulty swallowing, hypertension, hyperlipidemia, diabetes mellitus, GERD, hypothyroidism, depression,  chronic back pain, diverticulitis, bifascicular block, who presents with fever, cough and shortness of breath.  #Acute respiratory failure with hypoxia and sepsis due to aspiration pneumonia: Patient developed aspiration pneumonia likely contributed by dysphagia.  Sepsis parameter resolved.  Discharging with oral Augmentin.  Patient clinically improved.  He is DNR/DNI.  Going to a skilled nursing facility for continuation of his care.  Discussed with the patient and his 2 daughters.  #Hyperlipidemia: Continue statin.  #Parkinson disease: Continue Sinemet.  #Acute kidney injury: Serum creatinine level improved. On potassium chloride for the management of hypokalemia.  Recommend to monitor lab outpatient.  #Type 2 diabetes: Continue metformin. #GERD: Continue PPI #Hypertension continue amlodipine.  Monitor blood pressure.  #Physical deconditioning: Discussed with the patient's daughter concerned about difficulty taking care of patient at home.  Plan to transfer his care to skilled for continuation of his therapy.  Also discussed with the patient's daughters. Continue Augmentin and on dysphagia  diet.     Discharge Diagnoses:  Principal Problem:   Acute respiratory failure with hypoxia (HCC) Active Problems:   Hyperlipemia   Essential hypertension   Parkinson's plus syndrome (HCC)   GERD (gastroesophageal reflux disease)   Depression, major   Sepsis (HCC)   Type II diabetes mellitus with renal manifestations (HCC)   Acute renal failure superimposed on stage 2 chronic kidney disease (HCC)   Aspiration pneumonia (HCC)   Goals of care, counseling/discussion   Palliative care by specialist    Discharge Instructions  Discharge Instructions    Call MD for:  difficulty breathing, headache or visual disturbances   Complete by:  As directed    Call MD for:  extreme fatigue   Complete by:  As directed    Call MD for:  hives   Complete by:  As directed    Call MD for:  persistant dizziness or light-headedness   Complete by:  As directed    Call MD for:  persistant nausea and vomiting   Complete by:  As directed    Call MD for:  severe uncontrolled pain   Complete by:  As directed    Call MD for:  temperature >100.4   Complete by:  As directed    Diet general   Complete by:  As directed    Diet Recommendation Dysphagia 2 (Fine chop);Thin liquid  Medication Administration: Other (Comment)(liquid or crushed with puree) Supervision: Staff to assist with self feeding   Increase activity slowly   Complete by:  As directed      Allergies as of 01/27/2017      Reactions   Caffeine Swelling, Other (See Comments)   Reaction:  Joint swelling      Medication List    TAKE these medications   acetaminophen 500 MG tablet Commonly known as:  TYLENOL Take 1,000 mg by mouth every 6 (six) hours as needed for moderate pain or fever.  amLODipine 5 MG tablet Commonly known as:  NORVASC Take 1 tablet (5 mg total) by mouth daily.   amoxicillin-clavulanate 500-125 MG tablet Commonly known as:  AUGMENTIN Take 1 tablet (500 mg total) by mouth every 12 (twelve) hours for 5  days.   aspirin EC 81 MG tablet Take 81 mg by mouth daily.   benazepril 5 MG tablet Commonly known as:  LOTENSIN Take 1 tablet (5 mg total) by mouth daily.   CALCIUM-CARB 600 + D 600-125 MG-UNIT Tabs Generic drug:  Calcium Carbonate-Vitamin D Take 1 tablet by mouth 2 (two) times daily.   carbidopa-levodopa 25-100 MG tablet Commonly known as:  SINEMET IR Take 3 tablets by mouth 2 (two) times daily.   cyanocobalamin 1000 MCG/ML injection Commonly known as:  (VITAMIN B-12) Inject 1 mL (1,000 mcg total) into the muscle every 30 (thirty) days. Pt receives on the 15th of every month.   gabapentin 300 MG capsule Commonly known as:  NEURONTIN Take 1 capsule (300 mg total) by mouth at bedtime.   glucose blood test strip Use to test blood sugar up to three times daily Dx:E11.43   Lancet Device Misc One Touch [employer] Device Use as Directed in testing blood sugar Dx:E11.9   metFORMIN 1000 MG tablet Commonly known as:  GLUCOPHAGE TAKE 1 TABLET BY MOUTH TWO  TIMES DAILY WITH A MEAL   mirtazapine 30 MG tablet Commonly known as:  REMERON TAKE 1 TABLET BY MOUTH AT  BEDTIME   mupirocin ointment 2 % Commonly known as:  BACTROBAN APPLY  OINTMENT TOPICALLY AT BEDTIME TO  LESION  ON  SCROTUM   ONETOUCH DELICA LANCETS FINE Misc Check blood sugar once daily DX E11.43   pantoprazole 40 MG tablet Commonly known as:  PROTONIX TAKE 1 TABLET BY MOUTH TWO  TIMES DAILY   potassium chloride SA 20 MEQ tablet Commonly known as:  K-DUR,KLOR-CON Take 2 tablets (40 mEq total) by mouth daily.   pravastatin 20 MG tablet Commonly known as:  PRAVACHOL Take one tablet by mouth at bedtime   tamsulosin 0.4 MG Caps capsule Commonly known as:  FLOMAX Take 1 capsule (0.4 mg total) by mouth at bedtime.       Contact information for follow-up providers    Reed, Tiffany L, DO. Schedule an appointment as soon as possible for a visit in 1 week(s).   Specialty:  Geriatric Medicine Contact  information: [address] [place] [address] [phone]            Contact information for after-discharge care    Destination    [facility] Follow up.   Service:  Skilled Nursing Contact information: [address] [place] [address] [phone]                 Allergies  Allergen Reactions  . Caffeine Swelling and Other (See Comments)    Reaction:  Joint swelling    Consultations: None  Procedures/Studies: None  Subjective: seen and examined at bedside.  Sitting on chair comfortable.  Denies headache, dizziness, nausea vomiting chest pain shortness of breath.. Able to tolerate diet well.  Discharge Exam: Vitals:   01/26/17 2108 01/27/17 0528  BP: (!) 180/93 (!) 151/85  Pulse: 73 61  Resp: 18 18  Temp: 97.7 F (36.5 C) 97.6 F (36.4 C)  SpO2: 97% 97%   Vitals:   01/26/17 0517 01/26/17 1341 01/26/17 2108 01/27/17 0528  BP: (!) 168/90 (!) 154/86 (!) 180/93 (!) 151/85  Pulse: 60 80 73 61  Resp: 18  18 18   Temp: (!) 97.4 F (36.3 C) 97.9 F (36.6 C) 97.7 F (36.5 C) 97.6 F (36.4 C)  TempSrc: Axillary Oral Oral Oral  SpO2: 98% 94% 97% 97%  Weight:      Height:        General: Pt is alert, awake, not in acute distress Cardiovascular: RRR, S1/S2 +, no rubs, no gallops Respiratory: CTA bilaterally, no wheezing, no rhonchi Abdominal: Soft, NT, ND, bowel sounds + Extremities: no edema, no cyanosis    The results of significant diagnostics from this hospitalization (including imaging, microbiology, ancillary and laboratory) are listed below for reference.     Microbiology: Recent Results (from the past 240 hour(s))  Culture, blood (Routine X 2) w Reflex to ID Panel     Status: None (Preliminary result)   Collection Time: 01/22/17  6:20 PM  Result Value Ref Range Status   Specimen Description BLOOD RIGHT ANTECUBITAL  Final   Special Requests   Final    BOTTLES DRAWN AEROBIC AND ANAEROBIC  Blood Culture adequate volume   Culture   Final    NO GROWTH 4 DAYS Performed at [facility], [address] [place], [place] [address]    Report Status PENDING  Incomplete  Culture, blood (Routine X 2) w Reflex to ID Panel     Status: None (Preliminary result)   Collection Time: 01/22/17  6:25 PM  Result Value Ref Range Status   Specimen Description BLOOD LEFT HAND  Final   Special Requests IN PEDIATRIC BOTTLE Blood Culture adequate volume  Final   Culture   Final    NO GROWTH 4 DAYS Performed at [facility], [address] [place], [place] [address]    Report Status PENDING  Incomplete  Urine Culture     Status: Abnormal   Collection Time: 01/22/17  9:38 PM  Result Value Ref Range Status   Specimen Description URINE, CATHETERIZED  Final   Special Requests NONE  Final   Culture (A)  Final    <10,000 COLONIES/mL INSIGNIFICANT GROWTH Performed at [facility], [address] [place], [place] [address]    Report Status 01/24/2017 FINAL  Final     Labs: BNP (last 3 results) Recent Labs    01/22/17 1720  BNP 216.2*   Basic Metabolic Panel: Recent Labs  Lab 01/22/17 1720 01/24/17 0550 01/25/17 0535 01/26/17 0606  NA 138  --  140 139  K 3.8  --  2.9* 3.4*  CL 102  --  108 108  CO2 28  --  24 26  GLUCOSE 156*  --  108* 120*  BUN 34*  --  17 12  CREATININE 1.81* 0.98 0.90 0.93  CALCIUM 9.4  --  8.1* 8.5*  MG  --   --  1.2*  --    Liver Function Tests: Recent Labs  Lab 01/22/17 1720  AST 83*  ALT 34  ALKPHOS 84  BILITOT 1.3*  PROT 7.1  ALBUMIN 3.9   Recent Labs  Lab 01/22/17 1720  LIPASE 21   No results for input(s): AMMONIA in the last 168 hours. CBC: Recent Labs  Lab 01/22/17 1720 01/25/17 0535  WBC 14.9* 5.1  NEUTROABS 13.2*  --   HGB 15.5 12.5*  HCT 45.3 37.8*  MCV 94.2 93.3  PLT 206 167   Cardiac Enzymes: No results for input(s): CKTOTAL, CKMB, CKMBINDEX, TROPONINI in the last 168 hours. BNP:  Invalid input(s):  POCBNP CBG: Recent Labs  Lab 01/26/17 1137 01/26/17 1710 01/26/17 2110 01/27/17 0729 01/27/17 1122  GLUCAP 184* 154* 166* 131* 185*   D-Dimer No results for input(s): DDIMER in the last 72 hours. Hgb A1c No results for input(s): HGBA1C in the last 72 hours. Lipid Profile No results for input(s): CHOL, HDL, LDLCALC, TRIG, CHOLHDL, LDLDIRECT in the last 72 hours. Thyroid function studies No results for input(s): TSH, T4TOTAL, T3FREE, THYROIDAB in the last 72 hours.  Invalid input(s): FREET3 Anemia work up No results for input(s): VITAMINB12, FOLATE, FERRITIN, TIBC, IRON, RETICCTPCT in the last 72 hours. Urinalysis    Component Value Date/Time   COLORURINE YELLOW 01/22/2017 2137   APPEARANCEUR CLEAR 01/22/2017 2137   APPEARANCEUR Cloudy (A) 08/21/2013 1345   LABSPEC >1.046 (H) 01/22/2017 2137   PHURINE 5.0 01/22/2017 2137   GLUCOSEU NEGATIVE 01/22/2017 2137   HGBUR SMALL (A) 01/22/2017 2137   HGBUR 1+ 03/21/2009 1105   BILIRUBINUR NEGATIVE 01/22/2017 2137   BILIRUBINUR small (A) 12/08/2016 1508   BILIRUBINUR Large 10/09/2014 1549   BILIRUBINUR Negative 08/21/2013 1345   KETONESUR NEGATIVE 01/22/2017 2137   PROTEINUR NEGATIVE 01/22/2017 2137   UROBILINOGEN 0.2 12/08/2016 1508   UROBILINOGEN 1.0 11/04/2012 1458   NITRITE NEGATIVE 01/22/2017 2137   LEUKOCYTESUR TRACE (A) 01/22/2017 2137   LEUKOCYTESUR 1+ (A) 08/21/2013 1345   Sepsis Labs Invalid input(s): PROCALCITONIN,  WBC,  LACTICIDVEN Microbiology Recent Results (from the past 240 hour(s))  Culture, blood (Routine X 2) w Reflex to ID Panel     Status: None (Preliminary result)   Collection Time: 01/22/17  6:20 PM  Result Value Ref Range Status   Specimen Description BLOOD RIGHT ANTECUBITAL  Final   Special Requests   Final    BOTTLES DRAWN AEROBIC AND ANAEROBIC Blood Culture adequate volume   Culture   Final    NO GROWTH 4 DAYS Performed at [facility], [address] [place], [place] [address]     Report Status PENDING  Incomplete  Culture, blood (Routine X 2) w Reflex to ID Panel     Status: None (Preliminary result)   Collection Time: 01/22/17  6:25 PM  Result Value Ref Range Status   Specimen Description BLOOD LEFT HAND  Final   Special Requests IN PEDIATRIC BOTTLE Blood Culture adequate volume  Final   Culture   Final    NO GROWTH 4 DAYS Performed at [facility], [address] [place], [place] [address]    Report Status PENDING  Incomplete  Urine Culture     Status: Abnormal   Collection Time: 01/22/17  9:38 PM  Result Value Ref Range Status   Specimen Description URINE, CATHETERIZED  Final   Special Requests NONE  Final   Culture (A)  Final    <10,000 COLONIES/mL INSIGNIFICANT GROWTH Performed at [facility], [address], [place], [place] [address]    Report Status 01/24/2017 FINAL  Final     Time coordinating discharge: 33 minutes  SIGNED:   Maxie Barb, MD  [facility] 01/27/2017, 12:14 PM  If 7PM-7AM, please contact night-coverage [email or web address] Password [email or web address]

## 2017-01-27 NOTE — Progress Notes (Signed)
Date: January 27, 2017 Chart review for discharge needs:  None found for case management. Patient has no questions concerning post hospital care. 

## 2017-01-27 NOTE — Progress Notes (Signed)
Occupational Therapy Treatment Patient Details Name: Nicholas HalterDavid L Barnett MRN: [id number] DOB: 09-02-1931 Today's Date: 01/27/2017    History of present illness 83 y.o. male with medical history significant of Parkinson's disease, difficulty swallowing, hypertension, hyperlipidemia, diabetes mellitus, GERD, hypothyroidism, depression, CKD-2, chronic back pain, diverticulitis, bifascicular block, who presents with fever, cough and shortness of breath. Dx of aspiration pneumonia.   OT comments  Pt continue to need rehab to increase level of I for wife to safely  For him  Follow Up Recommendations  SNF    Equipment Recommendations  Other (comment)    Recommendations for Other Services      Precautions / Restrictions Precautions Precautions: Fall Precaution Comments: ~12 falls in past 1 year Restrictions Weight Bearing Restrictions: No       Mobility Bed Mobility Overal bed mobility: Needs Assistance Bed Mobility: Supine to Sit     Supine to sit: Mod assist     General bed mobility comments: VC for technique, sequencing as well as increased time  Transfers Overall transfer level: Needs assistance Equipment used: Rolling walker (2 wheeled) Transfers: Sit to/from [employer]Stand;Stand Pivot Transfers Sit to Stand: Mod assist;+2 safety/equipment Stand pivot transfers: Mod assist;+2 safety/equipment            Balance Overall balance assessment: Needs assistance;History of Falls Sitting-balance support: Bilateral upper extremity supported;Feet supported       Standing balance support: Bilateral upper extremity supported Standing balance-Leahy Scale: Poor Standing balance comment: requires BUE support                           ADL either performed or assessed with clinical judgement   ADL Overall ADL's : Needs assistance/impaired Eating/Feeding: Set up;Sitting                       Toilet Transfer: Moderate assistance;+2 for  safety/equipment;RW;Stand-pivot;Cueing for sequencing;Cueing for safety;Maximal assistance Toilet Transfer Details (indicate cue type and reason): bed to chair Toileting- Clothing Manipulation and Hygiene: Maximal assistance;Sit to/from stand;Cueing for sequencing;Cueing for safety         General ADL Comments: Continue to recomend SNF for pt as wife not able to provide the amount of A pt will need.  Pt will benefit from ST SNF to increase I with ADL activity     Vision Patient Visual Report: No change from baseline     Perception     Praxis      Cognition Arousal/Alertness: Awake/alert Behavior During Therapy: WFL for tasks assessed/performed Overall Cognitive Status: Within Functional Limits for tasks assessed                                                     Pertinent Vitals/ Pain       Pain Score: 0-No pain     Prior Functioning/Environment              Frequency  Min 2X/week         AM-PAC PT "6 Clicks" Daily Activity     Outcome Measure   Help from another person eating meals?: A Little Help from another person taking care of personal grooming?: A Little Help from another person toileting, which includes using toliet, bedpan, or urinal?: Total Help from another person bathing (including washing, rinsing, drying)?: Total Help from  another person to put on and taking off regular upper body clothing?: A Lot Help from another person to put on and taking off regular lower body clothing?: Total 6 Click Score: 11    End of Session Equipment Utilized During Treatment: Rolling walker  OT Visit Diagnosis: Unsteadiness on feet (R26.81);Repeated falls (R29.6);Muscle weakness (generalized) (M62.81);History of falling (Z91.81);Other abnormalities of gait and mobility (R26.89)   Activity Tolerance Patient tolerated treatment well   Patient Left in chair;with call bell/phone within reach;with chair alarm set;with family/visitor present    Nurse Communication Mobility status        Time: [id number] OT Time Calculation (min): 20 min  Charges: OT General Charges $OT Visit: 1 Visit OT Treatments $Self Care/Home Management : 8-22 mins  RutledgeLori Eleyna Barnett, [place] [phone]   Einar CrowEDDING, Nicholas Barnett 01/27/2017, 10:36 AM

## 2017-01-27 NOTE — Clinical Social Work Placement (Signed)
    12:31 PM Patient and family chose bed at [facility].   LCSW confirmed bed with facility.  Patient will transport by [facility].   LCSW faxed dc documents via [facility].  RN report # [phone]  LCSW signing off.  BKJ  CLINICAL SOCIAL WORK PLACEMENT  NOTE  Date:  01/27/2017  Patient Details  Name: Nicholas HalterDavid L Sassone MRN: [id number] Date of Birth: 04/10/1931  Clinical Social Work is seeking post-discharge placement for this patient at the Skilled  Nursing Facility level of care (*CSW will initial, date and re-position this form in  chart as items are completed):  Yes   Patient/family provided with [facility] Clinical Social Work Department's list of facilities offering this level of care within the geographic area requested by the patient (or if unable, by the patient's family).  Yes   Patient/family informed of their freedom to choose among providers that offer the needed level of care, that participate in Medicare, Medicaid or managed care program needed by the patient, have an available bed and are willing to accept the patient.  Yes   Patient/family informed of [facility] ownership interest in [facility] and [facility], as well as of the fact that they are under no obligation to receive care at these facilities.  PASRR submitted to EDS on       PASRR number received on 01/27/17     Existing PASRR number confirmed on       FL2 transmitted to all facilities in geographic area requested by pt/family on 01/26/17     FL2 transmitted to all facilities within larger geographic area on       Patient informed that his/her managed care company has contracts with or will negotiate with certain facilities, including the following:        Yes   Patient/family informed of bed offers received.  Patient chooses bed at [facility]     Physician recommends and patient chooses bed at [facility]    Patient to be transferred to [facility] on 01/27/17.  Patient to be transferred to facility by EMS     Patient family notified on 01/27/17 of transfer.  Name of family member notified:  Daughters and Wife     PHYSICIAN       Additional Comment:    _______________________________________________ Coralyn HellingBernette Samyia Motter, LCSW 01/27/2017, 12:31 PM

## 2017-01-27 NOTE — Progress Notes (Addendum)
LCSW following for SNF placement.  Patient and family chose bed at [facility].  Passar still under manual review. Received passar # [id number]    LCSW confirmed bed availability with facility.   Patient will go to [facility] at DC.   Nicholas GandyBernette Oneal Barnett, LSCW [facility] CSW 610 [phone].

## 2017-01-29 ENCOUNTER — Telehealth: Payer: Self-pay

## 2017-01-29 ENCOUNTER — Other Ambulatory Visit: Payer: Self-pay | Admitting: Internal Medicine

## 2017-01-29 NOTE — Telephone Encounter (Signed)
Please notify us when patient is discharging from [facility] so we can see him for transitions of care in the office (unless he becomes a long term care resident there).  We do not go to [facility].

## 2017-01-29 NOTE — Telephone Encounter (Signed)
Noted. Thank you - MM 

## 2017-01-29 NOTE — Telephone Encounter (Signed)
This is a patient of [facility], who was admitted to [facility] after hospitalization. [facility] needed. [facility].

## 2017-01-30 DIAGNOSIS — R1319 Other dysphagia: Secondary | ICD-10-CM | POA: Diagnosis not present

## 2017-01-30 DIAGNOSIS — J9601 Acute respiratory failure with hypoxia: Secondary | ICD-10-CM | POA: Diagnosis not present

## 2017-01-30 DIAGNOSIS — M25561 Pain in right knee: Secondary | ICD-10-CM | POA: Diagnosis not present

## 2017-01-30 DIAGNOSIS — G232 Striatonigral degeneration: Secondary | ICD-10-CM | POA: Diagnosis not present

## 2017-01-30 DIAGNOSIS — R2689 Other abnormalities of gait and mobility: Secondary | ICD-10-CM | POA: Diagnosis not present

## 2017-02-07 DIAGNOSIS — J690 Pneumonitis due to inhalation of food and vomit: Secondary | ICD-10-CM | POA: Diagnosis not present

## 2017-02-07 DIAGNOSIS — M1711 Unilateral primary osteoarthritis, right knee: Secondary | ICD-10-CM | POA: Diagnosis not present

## 2017-02-07 DIAGNOSIS — R1319 Other dysphagia: Secondary | ICD-10-CM | POA: Diagnosis not present

## 2017-02-07 DIAGNOSIS — G20 Parkinson's disease: Secondary | ICD-10-CM | POA: Diagnosis not present

## 2017-02-07 DIAGNOSIS — R2689 Other abnormalities of gait and mobility: Secondary | ICD-10-CM | POA: Diagnosis not present

## 2017-02-09 ENCOUNTER — Telehealth: Payer: Self-pay | Admitting: *Deleted

## 2017-02-09 DIAGNOSIS — N182 Chronic kidney disease, stage 2 (mild): Secondary | ICD-10-CM | POA: Diagnosis not present

## 2017-02-09 DIAGNOSIS — I129 Hypertensive chronic kidney disease with stage 1 through stage 4 chronic kidney disease, or unspecified chronic kidney disease: Secondary | ICD-10-CM | POA: Diagnosis not present

## 2017-02-09 DIAGNOSIS — E785 Hyperlipidemia, unspecified: Secondary | ICD-10-CM | POA: Diagnosis not present

## 2017-02-09 DIAGNOSIS — D519 Vitamin B12 deficiency anemia, unspecified: Secondary | ICD-10-CM | POA: Diagnosis not present

## 2017-02-09 DIAGNOSIS — R1312 Dysphagia, oropharyngeal phase: Secondary | ICD-10-CM | POA: Diagnosis not present

## 2017-02-09 DIAGNOSIS — N390 Urinary tract infection, site not specified: Secondary | ICD-10-CM | POA: Diagnosis not present

## 2017-02-09 DIAGNOSIS — S0083XD Contusion of other part of head, subsequent encounter: Secondary | ICD-10-CM | POA: Diagnosis not present

## 2017-02-09 DIAGNOSIS — G20 Parkinson's disease: Secondary | ICD-10-CM | POA: Diagnosis not present

## 2017-02-09 DIAGNOSIS — E1140 Type 2 diabetes mellitus with diabetic neuropathy, unspecified: Secondary | ICD-10-CM | POA: Diagnosis not present

## 2017-02-09 DIAGNOSIS — I452 Bifascicular block: Secondary | ICD-10-CM | POA: Diagnosis not present

## 2017-02-09 DIAGNOSIS — M25561 Pain in right knee: Secondary | ICD-10-CM | POA: Diagnosis not present

## 2017-02-09 DIAGNOSIS — E1122 Type 2 diabetes mellitus with diabetic chronic kidney disease: Secondary | ICD-10-CM | POA: Diagnosis not present

## 2017-02-09 NOTE — Telephone Encounter (Signed)
Odd that they would discharge him if they were concerned he had an infection.  Perhaps it was obtained with a catheter change?  Hard to know.  Considering his wife does not know about symptoms, I will await the input from the nurse at [facility].  Thanks.

## 2017-02-09 NOTE — Telephone Encounter (Signed)
Note that pt had aspiration pneumonia at the hospital.  FYI.  He may not actually have a UTI.

## 2017-02-09 NOTE — Telephone Encounter (Signed)
Spoke with anita and pt's appt was canceled by mistake, so Autumn MessingGerri will schedule him at 3:45 with Shanda BumpsJessica.

## 2017-02-09 NOTE — Telephone Encounter (Signed)
Per Dr.Reed need to know what symptoms patient is having.  Spoke with Mrs.Subramanian and she was unaware of what symptoms patient is/was having that prompted urine collection.   Left message on voicemail for Dori to return call when available, reason for call, need to answer all questions below:    1. What symptoms are you having (frequency, urgency, dysuria, incontinence, confusion)?  2. Any fever or chills?  3. Any suprapubic pain?  4. Have you taken anything OTC for symptoms?  5. How much water are you drinking daily?  6. How long have you had your symptoms (onset)?   I will forward this information to your provider and call with instructions, if your symptoms persist or progress seek medical attention at your nearest urgent care or emergency room. Patient verbalized understanding.

## 2017-02-09 NOTE — Telephone Encounter (Signed)
Nicholas Barnett, daughter called and stated that Patient was released from [facility]. They called her today with the results of U/A and stated that patient has UTI and would need to get medication from PCP. Stated that the nurse will be faxing the results to us today. Daughter is wanting patient to be treated ASAP so he doesn't end up back at the hospital. Patient does have an appointment tomorrow to follow up discharge. Would like to go ahead and get started on an antibiotic. Please Advise. (Awaiting Urine Result)

## 2017-02-10 ENCOUNTER — Ambulatory Visit: Payer: Medicare Other | Admitting: Nurse Practitioner

## 2017-02-10 ENCOUNTER — Encounter: Payer: Self-pay | Admitting: Nurse Practitioner

## 2017-02-10 ENCOUNTER — Ambulatory Visit (INDEPENDENT_AMBULATORY_CARE_PROVIDER_SITE_OTHER): Payer: Medicare Other | Admitting: Nurse Practitioner

## 2017-02-10 VITALS — BP 122/74 | HR 81 | Temp 98.4°F | Resp 17 | Ht 73.0 in | Wt 169.0 lb

## 2017-02-10 DIAGNOSIS — J690 Pneumonitis due to inhalation of food and vomit: Secondary | ICD-10-CM | POA: Diagnosis not present

## 2017-02-10 DIAGNOSIS — R1310 Dysphagia, unspecified: Secondary | ICD-10-CM | POA: Diagnosis not present

## 2017-02-10 DIAGNOSIS — R269 Unspecified abnormalities of gait and mobility: Secondary | ICD-10-CM | POA: Diagnosis not present

## 2017-02-10 DIAGNOSIS — N390 Urinary tract infection, site not specified: Secondary | ICD-10-CM | POA: Diagnosis not present

## 2017-02-10 LAB — CBC WITH DIFFERENTIAL/PLATELET
BASOS ABS: 69 {cells}/uL (ref 0–200)
Basophils Relative: 0.7 %
EOS ABS: 118 {cells}/uL (ref 15–500)
EOS PCT: 1.2 %
HEMATOCRIT: 42.6 % (ref 38.5–50.0)
Hemoglobin: 14.3 g/dL (ref 13.2–17.1)
LYMPHS ABS: 2411 {cells}/uL (ref 850–3900)
MCH: 31.2 pg (ref 27.0–33.0)
MCHC: 33.6 g/dL (ref 32.0–36.0)
MCV: 93 fL (ref 80.0–100.0)
MPV: 10.5 fL (ref 7.5–12.5)
Monocytes Relative: 6.2 %
NEUTROS PCT: 67.3 %
Neutro Abs: 6595 cells/uL (ref 1500–7800)
Platelets: 373 10*3/uL (ref 140–400)
RBC: 4.58 10*6/uL (ref 4.20–5.80)
RDW: 12.7 % (ref 11.0–15.0)
Total Lymphocyte: 24.6 %
WBC mixed population: 608 cells/uL (ref 200–950)
WBC: 9.8 10*3/uL (ref 3.8–10.8)

## 2017-02-10 LAB — BASIC METABOLIC PANEL WITH GFR
BUN / CREAT RATIO: 22 (calc) (ref 6–22)
BUN: 26 mg/dL — ABNORMAL HIGH (ref 7–25)
CO2: 26 mmol/L (ref 20–32)
Calcium: 10 mg/dL (ref 8.6–10.3)
Chloride: 103 mmol/L (ref 98–110)
Creat: 1.2 mg/dL — ABNORMAL HIGH (ref 0.70–1.11)
GFR, EST AFRICAN AMERICAN: 64 mL/min/{1.73_m2} (ref >=60)
GFR, EST NON AFRICAN AMERICAN: 56 mL/min/{1.73_m2} — AB (ref >=60)
Glucose, Bld: 164 mg/dL — ABNORMAL HIGH (ref 65–139)
POTASSIUM: 4.7 mmol/L (ref 3.5–5.3)
SODIUM: 141 mmol/L (ref 135–146)

## 2017-02-10 NOTE — Patient Instructions (Addendum)
To take Cipro every 12 hours until course complete  Make sure to use probiotic twice daily for 7 days   Increase hydration.   Cont dysphagia diet

## 2017-02-10 NOTE — Progress Notes (Signed)
Careteam: Patient Care Team: Gayland Curry, DO as PCP - General (Geriatric Medicine) Tat, Eustace Quail, DO as Consulting Physician (Neurology) Latanya Maudlin, MD as Consulting Physician (Orthopedic Surgery) Calvert Cantor, MD as Consulting Physician (Ophthalmology) Sable Feil, MD as Consulting Physician (Gastroenterology)  Advanced Directive information Does Patient Have a Medical Advance Directive?: Yes, Type of Advance Directive: [place];Living will  Allergies  Allergen Reactions  . Caffeine Swelling and Other (See Comments)    Reaction:  Joint swelling    Chief Complaint  Patient presents with  . Acute Visit    Pt is being seen due having UTI when discharged from [facility].      HPI: Patient is a 83 y.o. male seen in the office today to follow up nursing home and UTI.  Pt with medical history significant ofParkinson's disease, difficulty swallowing, hypertension, hyperlipidemia, diabetes mellitus, GERD, hypothyroidism, depression,  chronic back pain, diverticulitis, bifascicular block Pt was hospitalized 11/30-12/5/18 due to aspiration pneumonia he has completed Augmentin. He went to [facility] and was discharged 2 days home with his wife.  No shortness of breath or cough. Currently with dysphagia 2 diet with honey thick liquids -- per hospital notes thin liquid but daughter reports he is honey thick He was tested for UTI but has not had any symptoms. No abdominal discomfort, dysuria or fever.  Reports he has been dizzy but daughter said she has not heard him complain of this.  Urine culture resulted and facility prescribed him cipro which he started.  Attempted to go to bathroom alone and fell at nursing facility- bruised on right side of forehead.  Overall strength has improved since he has been at home working with home health.   Review of Systems:  Review of Systems  Constitutional: Negative for chills, fever and weight loss.  HENT:  Negative for tinnitus.   Eyes: Negative for blurred vision.  Respiratory: Negative for cough, sputum production and shortness of breath.   Cardiovascular: Negative for chest pain, palpitations and leg swelling.  Gastrointestinal: Negative for constipation, diarrhea and heartburn.  Genitourinary: Negative for dysuria, frequency and urgency.       Incontinence.  Musculoskeletal: Positive for back pain. Negative for falls, joint pain and myalgias.  Skin: Negative.   Neurological: Positive for dizziness and weakness. Negative for headaches.  Psychiatric/Behavioral: Negative for depression and memory loss. The patient does not have insomnia.     Past Medical History:  Diagnosis Date  . Abnormality of gait   . Arthritis   . Bifascicular block   . Cyanocobalamin deficiency   . Depression   . Diverticulitis   . Dizzy   . Dysphagia, idiopathic    History of aspiration  . Gait disturbance   . Hearing loss of both ears   . Hyperlipidemia   . Hypertension   . Lumbar back pain   . Memory loss   . Neck pain   . Osteoarthritis   . Osteoporosis   . Other and unspecified hyperlipidemia   . Parkinsonism ([place])   . Recurrent falls    Using a cane and walker  . Restless leg   . Restless legs syndrome (RLS)   . Right knee pain   . Shoulder pain   . Thoracic back pain   . Thyroid disease   . Type 2 diabetes mellitus with autonomic neuropathy ([place])   . Unspecified essential hypertension   . Unspecified hypothyroidism   . Vitamin D deficiency  Past Surgical History:  Procedure Laterality Date  . CATARACT EXTRACTION Bilateral 2012   Dr. Bing Plume   Social History:   reports that he quit smoking about 43 years ago. he has never used smokeless tobacco. He reports that he does not drink alcohol or use drugs.  Family History  Problem Relation Age of Onset  . Heart disease Mother   . Diabetes Mother   . Stroke Mother   . Cancer Father        prostate  . Diabetes Sister   . Heart  disease Brother     Medications:   Medication List        Accurate as of 02/10/17  4:05 PM. Always use your most recent med list.          acetaminophen 500 MG tablet Commonly known as:  TYLENOL   amLODipine 5 MG tablet Commonly known as:  NORVASC TAKE 1 TABLET BY MOUTH  DAILY   aspirin EC 81 MG tablet   benazepril 5 MG tablet Commonly known as:  LOTENSIN TAKE 1 TABLET BY MOUTH  DAILY   CALCIUM-CARB 600 + D 600-125 MG-UNIT Tabs Generic drug:  Calcium Carbonate-Vitamin D   carbidopa-levodopa 25-100 MG tablet Commonly known as:  SINEMET IR Take 3 tablets by mouth 2 (two) times daily.   ciprofloxacin 500 MG tablet Commonly known as:  CIPRO   cyanocobalamin 1000 MCG/ML injection Commonly known as:  (VITAMIN B-12) Inject 1 mL (1,000 mcg total) into the muscle every 30 (thirty) days. Pt receives on the 15th of every month.   gabapentin 300 MG capsule Commonly known as:  NEURONTIN TAKE 1 CAPSULE BY MOUTH AT  BEDTIME   glucose blood test strip Use to test blood sugar up to three times daily Dx:E11.43   Lancet Device Misc One Touch [employer] Device Use as Directed in testing blood sugar Dx:E11.9   metFORMIN 1000 MG tablet Commonly known as:  GLUCOPHAGE TAKE 1 TABLET BY MOUTH TWO  TIMES DAILY WITH A MEAL   mirtazapine 30 MG tablet Commonly known as:  REMERON TAKE 1 TABLET BY MOUTH AT  BEDTIME   mupirocin ointment 2 % Commonly known as:  BACTROBAN APPLY  OINTMENT TOPICALLY AT BEDTIME TO  LESION  ON  SCROTUM   ONETOUCH DELICA LANCETS FINE Misc Check blood sugar once daily DX E11.43   pantoprazole 40 MG tablet Commonly known as:  PROTONIX TAKE 1 TABLET BY MOUTH TWO  TIMES DAILY   pravastatin 20 MG tablet Commonly known as:  PRAVACHOL Take one tablet by mouth at bedtime   tamsulosin 0.4 MG Caps capsule Commonly known as:  FLOMAX TAKE 1 CAPSULE BY MOUTH AT  BEDTIME        Physical Exam:  Vitals:   02/10/17 1533  BP: 122/74  Pulse: 81  Resp:  17  Temp: 98.4 F (36.9 C)  TempSrc: Oral  SpO2: 96%  Weight: 169 lb (76.7 kg)  Height: '6\' 1"'  (1.854 m)   Body mass index is 22.3 kg/m.  Physical Exam  Constitutional: He is oriented to person, place, and time. He appears well-developed and well-nourished. No distress.  HENT:  Head: Normocephalic and atraumatic.  Hearing aids  Eyes:  glasses  Cardiovascular: Normal rate, regular rhythm and normal heart sounds.  Pulmonary/Chest: Effort normal and breath sounds normal. No respiratory distress.  Abdominal: Soft. Bowel sounds are normal. He exhibits no distension. There is no tenderness.  Musculoskeletal: He exhibits no edema.  Uses wheelchair   Neurological: He  is alert and oriented to person, place, and time.  Poor historian, his wife and daughter correct him during visit  Skin: Skin is warm and dry.  Bruising noted over right eye  Psychiatric:  Flat affect    Labs reviewed: Basic Metabolic Panel: Recent Labs    10/20/16 0955  01/22/17 1720 01/24/17 0550 01/25/17 0535 01/26/17 0606  NA 141   < > 138  --  140 139  K 4.0   < > 3.8  --  2.9* 3.4*  CL 101   < > 102  --  108 108  CO2 24   < > 28  --  24 26  GLUCOSE 95   < > 156*  --  108* 120*  BUN 24   < > 34*  --  17 12  CREATININE 1.13*   < > 1.81* 0.98 0.90 0.93  CALCIUM 9.8   < > 9.4  --  8.1* 8.5*  MG  --   --   --   --  1.2*  --   TSH 2.70  --   --   --   --   --    < > = values in this interval not displayed.   Liver Function Tests: Recent Labs    10/20/16 0955 12/08/16 1216 01/22/17 1720  AST 13 14 83*  ALT 13 6 34  ALKPHOS 78 90 84  BILITOT 0.6 0.4 1.3*  PROT 6.8 6.7 7.1  ALBUMIN 4.3 4.2 3.9   Recent Labs    01/22/17 1720  LIPASE 21   No results for input(s): AMMONIA in the last 8760 hours. CBC: Recent Labs    12/08/16 1207 01/22/17 1720 01/25/17 0535  WBC 8.2 14.9* 5.1  NEUTROABS  --  13.2*  --   HGB 14.9 15.5 12.5*  HCT 44.1 45.3 37.8*  MCV 90.6 94.2 93.3  PLT  --  206 167    Lipid Panel: Recent Labs    10/20/16 0955  CHOL 154  HDL 51  LDLCALC 83  TRIG 99  CHOLHDL 3.0   TSH: Recent Labs    10/20/16 0955  TSH 2.70   A1C: Lab Results  Component Value Date   HGBA1C 5.2 10/20/2016     Assessment/Plan 1. Urinary tract infection without hematuria, site unspecified -without symptoms, went into details about testing without symptoms and being colonized. Daughter understood but wife still had a hard time with this and frequently wants him checked for UTI without symptoms.  - CBC with Differential/Platelets - BMP with eGFR -to complete course of antibiotic, encouraged probiotic twice daily   2. Dysphagia, idiopathic -cont dysphagia diet recommended by ST  3. Gait difficulty -ongoing gait difficulties however strength improving and participating with home health PT  4. Aspiration pneumonia, unspecified aspiration pneumonia type, unspecified laterality, unspecified part of lung ([place]) Completed Augmentin in nursing facility, no recurrent symptoms   Next appt:  Carlos American. Harle Battiest  [facility] [phone] 8 am - 5 pm) [phone] (after hours)

## 2017-02-10 NOTE — Telephone Encounter (Signed)
Patient has appointment to see Abbey ChattersJessica Eubanks, NP today

## 2017-02-11 DIAGNOSIS — I129 Hypertensive chronic kidney disease with stage 1 through stage 4 chronic kidney disease, or unspecified chronic kidney disease: Secondary | ICD-10-CM | POA: Diagnosis not present

## 2017-02-11 DIAGNOSIS — E1122 Type 2 diabetes mellitus with diabetic chronic kidney disease: Secondary | ICD-10-CM | POA: Diagnosis not present

## 2017-02-11 DIAGNOSIS — R1312 Dysphagia, oropharyngeal phase: Secondary | ICD-10-CM | POA: Diagnosis not present

## 2017-02-11 DIAGNOSIS — G20 Parkinson's disease: Secondary | ICD-10-CM | POA: Diagnosis not present

## 2017-02-11 DIAGNOSIS — S0083XD Contusion of other part of head, subsequent encounter: Secondary | ICD-10-CM | POA: Diagnosis not present

## 2017-02-11 DIAGNOSIS — I452 Bifascicular block: Secondary | ICD-10-CM | POA: Diagnosis not present

## 2017-02-11 DIAGNOSIS — M25561 Pain in right knee: Secondary | ICD-10-CM | POA: Diagnosis not present

## 2017-02-11 DIAGNOSIS — N182 Chronic kidney disease, stage 2 (mild): Secondary | ICD-10-CM | POA: Diagnosis not present

## 2017-02-11 DIAGNOSIS — D519 Vitamin B12 deficiency anemia, unspecified: Secondary | ICD-10-CM | POA: Diagnosis not present

## 2017-02-11 DIAGNOSIS — E785 Hyperlipidemia, unspecified: Secondary | ICD-10-CM | POA: Diagnosis not present

## 2017-02-11 DIAGNOSIS — E1140 Type 2 diabetes mellitus with diabetic neuropathy, unspecified: Secondary | ICD-10-CM | POA: Diagnosis not present

## 2017-02-11 DIAGNOSIS — N390 Urinary tract infection, site not specified: Secondary | ICD-10-CM | POA: Diagnosis not present

## 2017-02-12 ENCOUNTER — Telehealth: Payer: Self-pay | Admitting: *Deleted

## 2017-02-12 NOTE — Telephone Encounter (Signed)
Nicholas Barnett with [facility] called requesting verbal orders for PT 2X4wks Verbal orders given.

## 2017-02-13 DIAGNOSIS — E785 Hyperlipidemia, unspecified: Secondary | ICD-10-CM | POA: Diagnosis not present

## 2017-02-13 DIAGNOSIS — S0083XD Contusion of other part of head, subsequent encounter: Secondary | ICD-10-CM | POA: Diagnosis not present

## 2017-02-13 DIAGNOSIS — N182 Chronic kidney disease, stage 2 (mild): Secondary | ICD-10-CM | POA: Diagnosis not present

## 2017-02-13 DIAGNOSIS — G20 Parkinson's disease: Secondary | ICD-10-CM | POA: Diagnosis not present

## 2017-02-13 DIAGNOSIS — D519 Vitamin B12 deficiency anemia, unspecified: Secondary | ICD-10-CM | POA: Diagnosis not present

## 2017-02-13 DIAGNOSIS — E1140 Type 2 diabetes mellitus with diabetic neuropathy, unspecified: Secondary | ICD-10-CM | POA: Diagnosis not present

## 2017-02-13 DIAGNOSIS — E1122 Type 2 diabetes mellitus with diabetic chronic kidney disease: Secondary | ICD-10-CM | POA: Diagnosis not present

## 2017-02-13 DIAGNOSIS — I452 Bifascicular block: Secondary | ICD-10-CM | POA: Diagnosis not present

## 2017-02-13 DIAGNOSIS — R1312 Dysphagia, oropharyngeal phase: Secondary | ICD-10-CM | POA: Diagnosis not present

## 2017-02-13 DIAGNOSIS — M25561 Pain in right knee: Secondary | ICD-10-CM | POA: Diagnosis not present

## 2017-02-13 DIAGNOSIS — I129 Hypertensive chronic kidney disease with stage 1 through stage 4 chronic kidney disease, or unspecified chronic kidney disease: Secondary | ICD-10-CM | POA: Diagnosis not present

## 2017-02-13 DIAGNOSIS — N390 Urinary tract infection, site not specified: Secondary | ICD-10-CM | POA: Diagnosis not present

## 2017-02-22 DIAGNOSIS — E1122 Type 2 diabetes mellitus with diabetic chronic kidney disease: Secondary | ICD-10-CM | POA: Diagnosis not present

## 2017-02-22 DIAGNOSIS — E785 Hyperlipidemia, unspecified: Secondary | ICD-10-CM | POA: Diagnosis not present

## 2017-02-22 DIAGNOSIS — N390 Urinary tract infection, site not specified: Secondary | ICD-10-CM | POA: Diagnosis not present

## 2017-02-22 DIAGNOSIS — G20 Parkinson's disease: Secondary | ICD-10-CM | POA: Diagnosis not present

## 2017-02-22 DIAGNOSIS — S0083XD Contusion of other part of head, subsequent encounter: Secondary | ICD-10-CM | POA: Diagnosis not present

## 2017-02-22 DIAGNOSIS — E1140 Type 2 diabetes mellitus with diabetic neuropathy, unspecified: Secondary | ICD-10-CM | POA: Diagnosis not present

## 2017-02-22 DIAGNOSIS — N182 Chronic kidney disease, stage 2 (mild): Secondary | ICD-10-CM | POA: Diagnosis not present

## 2017-02-22 DIAGNOSIS — I452 Bifascicular block: Secondary | ICD-10-CM | POA: Diagnosis not present

## 2017-02-22 DIAGNOSIS — I129 Hypertensive chronic kidney disease with stage 1 through stage 4 chronic kidney disease, or unspecified chronic kidney disease: Secondary | ICD-10-CM | POA: Diagnosis not present

## 2017-02-22 DIAGNOSIS — M25561 Pain in right knee: Secondary | ICD-10-CM | POA: Diagnosis not present

## 2017-02-22 DIAGNOSIS — D519 Vitamin B12 deficiency anemia, unspecified: Secondary | ICD-10-CM | POA: Diagnosis not present

## 2017-02-22 DIAGNOSIS — R1312 Dysphagia, oropharyngeal phase: Secondary | ICD-10-CM | POA: Diagnosis not present

## 2017-02-26 ENCOUNTER — Telehealth: Payer: Self-pay | Admitting: *Deleted

## 2017-02-26 NOTE — Telephone Encounter (Signed)
Mary with [facility] called and stated that patient has a new insurance with the beginning of the year and needs new Home Health orders. Verbal order given for PT/OT/ST to eval and Tx.

## 2017-02-28 DIAGNOSIS — R1312 Dysphagia, oropharyngeal phase: Secondary | ICD-10-CM | POA: Diagnosis not present

## 2017-02-28 DIAGNOSIS — N182 Chronic kidney disease, stage 2 (mild): Secondary | ICD-10-CM | POA: Diagnosis not present

## 2017-02-28 DIAGNOSIS — F329 Major depressive disorder, single episode, unspecified: Secondary | ICD-10-CM | POA: Diagnosis not present

## 2017-02-28 DIAGNOSIS — G20 Parkinson's disease: Secondary | ICD-10-CM | POA: Diagnosis not present

## 2017-02-28 DIAGNOSIS — I129 Hypertensive chronic kidney disease with stage 1 through stage 4 chronic kidney disease, or unspecified chronic kidney disease: Secondary | ICD-10-CM | POA: Diagnosis not present

## 2017-02-28 DIAGNOSIS — Z8701 Personal history of pneumonia (recurrent): Secondary | ICD-10-CM | POA: Diagnosis not present

## 2017-02-28 DIAGNOSIS — M1711 Unilateral primary osteoarthritis, right knee: Secondary | ICD-10-CM | POA: Diagnosis not present

## 2017-02-28 DIAGNOSIS — E1122 Type 2 diabetes mellitus with diabetic chronic kidney disease: Secondary | ICD-10-CM | POA: Diagnosis not present

## 2017-03-01 ENCOUNTER — Telehealth: Payer: Self-pay | Admitting: *Deleted

## 2017-03-01 DIAGNOSIS — E1122 Type 2 diabetes mellitus with diabetic chronic kidney disease: Secondary | ICD-10-CM | POA: Diagnosis not present

## 2017-03-01 DIAGNOSIS — R1312 Dysphagia, oropharyngeal phase: Secondary | ICD-10-CM | POA: Diagnosis not present

## 2017-03-01 DIAGNOSIS — I129 Hypertensive chronic kidney disease with stage 1 through stage 4 chronic kidney disease, or unspecified chronic kidney disease: Secondary | ICD-10-CM | POA: Diagnosis not present

## 2017-03-01 DIAGNOSIS — N182 Chronic kidney disease, stage 2 (mild): Secondary | ICD-10-CM | POA: Diagnosis not present

## 2017-03-01 DIAGNOSIS — M1711 Unilateral primary osteoarthritis, right knee: Secondary | ICD-10-CM | POA: Diagnosis not present

## 2017-03-01 DIAGNOSIS — G20 Parkinson's disease: Secondary | ICD-10-CM | POA: Diagnosis not present

## 2017-03-01 NOTE — Telephone Encounter (Signed)
Nicholas Barnett with [facility] called requesting verbal orders for PT 2x5wks. Verbal orders given.

## 2017-03-03 DIAGNOSIS — I129 Hypertensive chronic kidney disease with stage 1 through stage 4 chronic kidney disease, or unspecified chronic kidney disease: Secondary | ICD-10-CM | POA: Diagnosis not present

## 2017-03-03 DIAGNOSIS — E1122 Type 2 diabetes mellitus with diabetic chronic kidney disease: Secondary | ICD-10-CM | POA: Diagnosis not present

## 2017-03-03 DIAGNOSIS — R1312 Dysphagia, oropharyngeal phase: Secondary | ICD-10-CM | POA: Diagnosis not present

## 2017-03-03 DIAGNOSIS — G20 Parkinson's disease: Secondary | ICD-10-CM | POA: Diagnosis not present

## 2017-03-03 DIAGNOSIS — N182 Chronic kidney disease, stage 2 (mild): Secondary | ICD-10-CM | POA: Diagnosis not present

## 2017-03-03 DIAGNOSIS — M1711 Unilateral primary osteoarthritis, right knee: Secondary | ICD-10-CM | POA: Diagnosis not present

## 2017-03-05 DIAGNOSIS — G20 Parkinson's disease: Secondary | ICD-10-CM | POA: Diagnosis not present

## 2017-03-05 DIAGNOSIS — I129 Hypertensive chronic kidney disease with stage 1 through stage 4 chronic kidney disease, or unspecified chronic kidney disease: Secondary | ICD-10-CM | POA: Diagnosis not present

## 2017-03-05 DIAGNOSIS — E1122 Type 2 diabetes mellitus with diabetic chronic kidney disease: Secondary | ICD-10-CM | POA: Diagnosis not present

## 2017-03-05 DIAGNOSIS — M1711 Unilateral primary osteoarthritis, right knee: Secondary | ICD-10-CM | POA: Diagnosis not present

## 2017-03-05 DIAGNOSIS — R1312 Dysphagia, oropharyngeal phase: Secondary | ICD-10-CM | POA: Diagnosis not present

## 2017-03-05 DIAGNOSIS — N182 Chronic kidney disease, stage 2 (mild): Secondary | ICD-10-CM | POA: Diagnosis not present

## 2017-03-09 DIAGNOSIS — N182 Chronic kidney disease, stage 2 (mild): Secondary | ICD-10-CM | POA: Diagnosis not present

## 2017-03-09 DIAGNOSIS — G20 Parkinson's disease: Secondary | ICD-10-CM | POA: Diagnosis not present

## 2017-03-09 DIAGNOSIS — E1122 Type 2 diabetes mellitus with diabetic chronic kidney disease: Secondary | ICD-10-CM | POA: Diagnosis not present

## 2017-03-09 DIAGNOSIS — I129 Hypertensive chronic kidney disease with stage 1 through stage 4 chronic kidney disease, or unspecified chronic kidney disease: Secondary | ICD-10-CM | POA: Diagnosis not present

## 2017-03-09 DIAGNOSIS — R1312 Dysphagia, oropharyngeal phase: Secondary | ICD-10-CM | POA: Diagnosis not present

## 2017-03-09 DIAGNOSIS — M1711 Unilateral primary osteoarthritis, right knee: Secondary | ICD-10-CM | POA: Diagnosis not present

## 2017-03-11 DIAGNOSIS — I129 Hypertensive chronic kidney disease with stage 1 through stage 4 chronic kidney disease, or unspecified chronic kidney disease: Secondary | ICD-10-CM | POA: Diagnosis not present

## 2017-03-11 DIAGNOSIS — G214 Vascular parkinsonism: Secondary | ICD-10-CM | POA: Diagnosis not present

## 2017-03-11 DIAGNOSIS — G232 Striatonigral degeneration: Secondary | ICD-10-CM | POA: Diagnosis not present

## 2017-03-11 DIAGNOSIS — R1312 Dysphagia, oropharyngeal phase: Secondary | ICD-10-CM | POA: Diagnosis not present

## 2017-03-11 DIAGNOSIS — N182 Chronic kidney disease, stage 2 (mild): Secondary | ICD-10-CM | POA: Diagnosis not present

## 2017-03-11 DIAGNOSIS — J9601 Acute respiratory failure with hypoxia: Secondary | ICD-10-CM | POA: Diagnosis not present

## 2017-03-11 DIAGNOSIS — E1122 Type 2 diabetes mellitus with diabetic chronic kidney disease: Secondary | ICD-10-CM | POA: Diagnosis not present

## 2017-03-11 DIAGNOSIS — M1711 Unilateral primary osteoarthritis, right knee: Secondary | ICD-10-CM | POA: Diagnosis not present

## 2017-03-11 DIAGNOSIS — G20 Parkinson's disease: Secondary | ICD-10-CM | POA: Diagnosis not present

## 2017-03-16 DIAGNOSIS — M1711 Unilateral primary osteoarthritis, right knee: Secondary | ICD-10-CM | POA: Diagnosis not present

## 2017-03-16 DIAGNOSIS — N182 Chronic kidney disease, stage 2 (mild): Secondary | ICD-10-CM | POA: Diagnosis not present

## 2017-03-16 DIAGNOSIS — R1312 Dysphagia, oropharyngeal phase: Secondary | ICD-10-CM | POA: Diagnosis not present

## 2017-03-16 DIAGNOSIS — I129 Hypertensive chronic kidney disease with stage 1 through stage 4 chronic kidney disease, or unspecified chronic kidney disease: Secondary | ICD-10-CM | POA: Diagnosis not present

## 2017-03-16 DIAGNOSIS — E1122 Type 2 diabetes mellitus with diabetic chronic kidney disease: Secondary | ICD-10-CM | POA: Diagnosis not present

## 2017-03-16 DIAGNOSIS — G20 Parkinson's disease: Secondary | ICD-10-CM | POA: Diagnosis not present

## 2017-03-17 ENCOUNTER — Other Ambulatory Visit: Payer: Self-pay | Admitting: *Deleted

## 2017-03-17 MED ORDER — AMLODIPINE BESYLATE 5 MG PO TABS: 5.0000 mg | ORAL_TABLET | Freq: Every day | ORAL | 3 refills | Status: DC

## 2017-03-17 MED ORDER — PRAVASTATIN SODIUM 20 MG PO TABS: ORAL_TABLET | ORAL | 3 refills | Status: DC

## 2017-03-17 MED ORDER — GABAPENTIN 300 MG PO CAPS: 300.0000 mg | ORAL_CAPSULE | Freq: Every day | ORAL | 3 refills | Status: DC

## 2017-03-17 MED ORDER — BENAZEPRIL HCL 5 MG PO TABS: 5.0000 mg | ORAL_TABLET | Freq: Every day | ORAL | 3 refills | Status: DC

## 2017-03-17 MED ORDER — TAMSULOSIN HCL 0.4 MG PO CAPS: 0.4000 mg | ORAL_CAPSULE | Freq: Every day | ORAL | 3 refills | Status: DC

## 2017-03-17 MED ORDER — PANTOPRAZOLE SODIUM 40 MG PO TBEC: DELAYED_RELEASE_TABLET | ORAL | 3 refills | Status: DC

## 2017-03-17 MED ORDER — MIRTAZAPINE 30 MG PO TABS: ORAL_TABLET | ORAL | 3 refills | Status: DC

## 2017-03-17 MED ORDER — CARBIDOPA-LEVODOPA 25-100 MG PO TABS: 3.0000 | ORAL_TABLET | Freq: Two times a day (BID) | ORAL | 3 refills | Status: DC

## 2017-03-17 MED ORDER — METFORMIN HCL 1000 MG PO TABS: ORAL_TABLET | ORAL | 3 refills | Status: DC

## 2017-03-17 NOTE — Telephone Encounter (Signed)
[facility] 

## 2017-03-18 DIAGNOSIS — R1312 Dysphagia, oropharyngeal phase: Secondary | ICD-10-CM | POA: Diagnosis not present

## 2017-03-18 DIAGNOSIS — I129 Hypertensive chronic kidney disease with stage 1 through stage 4 chronic kidney disease, or unspecified chronic kidney disease: Secondary | ICD-10-CM | POA: Diagnosis not present

## 2017-03-18 DIAGNOSIS — E1122 Type 2 diabetes mellitus with diabetic chronic kidney disease: Secondary | ICD-10-CM | POA: Diagnosis not present

## 2017-03-18 DIAGNOSIS — G20 Parkinson's disease: Secondary | ICD-10-CM | POA: Diagnosis not present

## 2017-03-18 DIAGNOSIS — M1711 Unilateral primary osteoarthritis, right knee: Secondary | ICD-10-CM | POA: Diagnosis not present

## 2017-03-18 DIAGNOSIS — N182 Chronic kidney disease, stage 2 (mild): Secondary | ICD-10-CM | POA: Diagnosis not present

## 2017-03-19 ENCOUNTER — Other Ambulatory Visit: Payer: Self-pay | Admitting: *Deleted

## 2017-03-19 MED ORDER — TRUEPLUS LANCETS 33G MISC: 3 refills | Status: DC

## 2017-03-19 MED ORDER — GLUCOSE BLOOD VI STRP: ORAL_STRIP | 3 refills | Status: DC

## 2017-03-19 MED ORDER — TRUE METRIX AIR GLUCOSE METER W/DEVICE KIT: 1.0000 | PACK | Freq: Three times a day (TID) | 0 refills | Status: DC

## 2017-03-19 NOTE — Telephone Encounter (Signed)
[facility] 

## 2017-03-23 DIAGNOSIS — M1711 Unilateral primary osteoarthritis, right knee: Secondary | ICD-10-CM | POA: Diagnosis not present

## 2017-03-23 DIAGNOSIS — E1122 Type 2 diabetes mellitus with diabetic chronic kidney disease: Secondary | ICD-10-CM | POA: Diagnosis not present

## 2017-03-23 DIAGNOSIS — G20 Parkinson's disease: Secondary | ICD-10-CM | POA: Diagnosis not present

## 2017-03-23 DIAGNOSIS — N182 Chronic kidney disease, stage 2 (mild): Secondary | ICD-10-CM | POA: Diagnosis not present

## 2017-03-23 DIAGNOSIS — R1312 Dysphagia, oropharyngeal phase: Secondary | ICD-10-CM | POA: Diagnosis not present

## 2017-03-23 DIAGNOSIS — I129 Hypertensive chronic kidney disease with stage 1 through stage 4 chronic kidney disease, or unspecified chronic kidney disease: Secondary | ICD-10-CM | POA: Diagnosis not present

## 2017-03-24 ENCOUNTER — Telehealth: Payer: Self-pay | Admitting: *Deleted

## 2017-03-24 MED ORDER — DICLOFENAC SODIUM 1 % TD GEL: 4.0000 g | Freq: Four times a day (QID) | TRANSDERMAL | 5 refills | Status: DC

## 2017-03-24 NOTE — Telephone Encounter (Signed)
Wife, [name] called and stated that patient has been having some knee pain. PT nurse told them to call us to see if he could get a Rx for Voltaren Gel to put on it to ease the pain. Stated that his knee cap is about "shot" and when he does PT it hurts. No swelling and no redness just painful. Please Advise.   Pharmacy Confirmed-[facility]

## 2017-03-24 NOTE — Telephone Encounter (Signed)
Medication list updated. Faxed Rx to pharmacy. Wife notified.

## 2017-03-24 NOTE — Telephone Encounter (Signed)
voltaren gel 4 g qid to affected knee is fine with me--use the generic diclofenac gel selection, please.  1 tube with 5 refills.

## 2017-03-25 DIAGNOSIS — R1312 Dysphagia, oropharyngeal phase: Secondary | ICD-10-CM | POA: Diagnosis not present

## 2017-03-25 DIAGNOSIS — M1711 Unilateral primary osteoarthritis, right knee: Secondary | ICD-10-CM | POA: Diagnosis not present

## 2017-03-25 DIAGNOSIS — I129 Hypertensive chronic kidney disease with stage 1 through stage 4 chronic kidney disease, or unspecified chronic kidney disease: Secondary | ICD-10-CM | POA: Diagnosis not present

## 2017-03-25 DIAGNOSIS — N182 Chronic kidney disease, stage 2 (mild): Secondary | ICD-10-CM | POA: Diagnosis not present

## 2017-03-25 DIAGNOSIS — E1122 Type 2 diabetes mellitus with diabetic chronic kidney disease: Secondary | ICD-10-CM | POA: Diagnosis not present

## 2017-03-25 DIAGNOSIS — G20 Parkinson's disease: Secondary | ICD-10-CM | POA: Diagnosis not present

## 2017-03-30 DIAGNOSIS — M1711 Unilateral primary osteoarthritis, right knee: Secondary | ICD-10-CM | POA: Diagnosis not present

## 2017-03-30 DIAGNOSIS — E1122 Type 2 diabetes mellitus with diabetic chronic kidney disease: Secondary | ICD-10-CM | POA: Diagnosis not present

## 2017-03-30 DIAGNOSIS — G20 Parkinson's disease: Secondary | ICD-10-CM | POA: Diagnosis not present

## 2017-03-30 DIAGNOSIS — N182 Chronic kidney disease, stage 2 (mild): Secondary | ICD-10-CM | POA: Diagnosis not present

## 2017-03-30 DIAGNOSIS — R1312 Dysphagia, oropharyngeal phase: Secondary | ICD-10-CM | POA: Diagnosis not present

## 2017-03-30 DIAGNOSIS — I129 Hypertensive chronic kidney disease with stage 1 through stage 4 chronic kidney disease, or unspecified chronic kidney disease: Secondary | ICD-10-CM | POA: Diagnosis not present

## 2017-04-01 DIAGNOSIS — N182 Chronic kidney disease, stage 2 (mild): Secondary | ICD-10-CM | POA: Diagnosis not present

## 2017-04-01 DIAGNOSIS — E1122 Type 2 diabetes mellitus with diabetic chronic kidney disease: Secondary | ICD-10-CM | POA: Diagnosis not present

## 2017-04-01 DIAGNOSIS — G20 Parkinson's disease: Secondary | ICD-10-CM | POA: Diagnosis not present

## 2017-04-01 DIAGNOSIS — I129 Hypertensive chronic kidney disease with stage 1 through stage 4 chronic kidney disease, or unspecified chronic kidney disease: Secondary | ICD-10-CM | POA: Diagnosis not present

## 2017-04-01 DIAGNOSIS — R1312 Dysphagia, oropharyngeal phase: Secondary | ICD-10-CM | POA: Diagnosis not present

## 2017-04-01 DIAGNOSIS — M1711 Unilateral primary osteoarthritis, right knee: Secondary | ICD-10-CM | POA: Diagnosis not present

## 2017-04-06 DIAGNOSIS — G232 Striatonigral degeneration: Secondary | ICD-10-CM | POA: Diagnosis not present

## 2017-04-06 DIAGNOSIS — R1312 Dysphagia, oropharyngeal phase: Secondary | ICD-10-CM | POA: Diagnosis not present

## 2017-04-06 DIAGNOSIS — G214 Vascular parkinsonism: Secondary | ICD-10-CM | POA: Diagnosis not present

## 2017-04-06 DIAGNOSIS — J9601 Acute respiratory failure with hypoxia: Secondary | ICD-10-CM | POA: Diagnosis not present

## 2017-04-06 DIAGNOSIS — G20 Parkinson's disease: Secondary | ICD-10-CM | POA: Diagnosis not present

## 2017-04-09 ENCOUNTER — Encounter: Payer: Self-pay | Admitting: Internal Medicine

## 2017-04-09 DIAGNOSIS — J209 Acute bronchitis, unspecified: Secondary | ICD-10-CM | POA: Diagnosis not present

## 2017-04-09 LAB — CBC AND DIFFERENTIAL
HCT: 45 (ref 41–53)
HEMOGLOBIN: 15.2 (ref 13.5–17.5)
Platelets: 271 (ref 150–399)
WBC: 10

## 2017-04-11 DIAGNOSIS — G232 Striatonigral degeneration: Secondary | ICD-10-CM | POA: Diagnosis not present

## 2017-04-11 DIAGNOSIS — R1312 Dysphagia, oropharyngeal phase: Secondary | ICD-10-CM | POA: Diagnosis not present

## 2017-04-11 DIAGNOSIS — G20 Parkinson's disease: Secondary | ICD-10-CM | POA: Diagnosis not present

## 2017-04-11 DIAGNOSIS — J9601 Acute respiratory failure with hypoxia: Secondary | ICD-10-CM | POA: Diagnosis not present

## 2017-04-11 DIAGNOSIS — G214 Vascular parkinsonism: Secondary | ICD-10-CM | POA: Diagnosis not present

## 2017-04-19 ENCOUNTER — Ambulatory Visit: Payer: Medicare Other

## 2017-04-26 ENCOUNTER — Ambulatory Visit: Payer: Medicare Other | Admitting: Internal Medicine

## 2017-04-29 ENCOUNTER — Encounter: Payer: Self-pay | Admitting: Nurse Practitioner

## 2017-04-29 ENCOUNTER — Ambulatory Visit
Admission: RE | Admit: 2017-04-29 | Discharge: 2017-04-29 | Disposition: A | Discharge status: Home / Self Care | Payer: Medicare Other | Source: Ambulatory Visit | Attending: Nurse Practitioner | Admitting: Nurse Practitioner

## 2017-04-29 ENCOUNTER — Ambulatory Visit (INDEPENDENT_AMBULATORY_CARE_PROVIDER_SITE_OTHER): Payer: Medicare Other | Admitting: Nurse Practitioner

## 2017-04-29 VITALS — BP 108/74 | HR 70 | Temp 97.9°F | Ht 73.0 in | Wt 159.0 lb

## 2017-04-29 DIAGNOSIS — J209 Acute bronchitis, unspecified: Secondary | ICD-10-CM | POA: Diagnosis not present

## 2017-04-29 DIAGNOSIS — R05 Cough: Secondary | ICD-10-CM | POA: Diagnosis not present

## 2017-04-29 LAB — BASIC METABOLIC PANEL WITH GFR
BUN / CREAT RATIO: 21 (calc) (ref 6–22)
BUN: 24 mg/dL (ref 7–25)
CO2: 29 mmol/L (ref 20–32)
Calcium: 9.8 mg/dL (ref 8.6–10.3)
Chloride: 101 mmol/L (ref 98–110)
Creat: 1.12 mg/dL — ABNORMAL HIGH (ref 0.70–1.11)
GFR, EST AFRICAN AMERICAN: 70 mL/min/{1.73_m2} (ref >=60)
GFR, Est Non African American: 60 mL/min/{1.73_m2} (ref >=60)
Glucose, Bld: 160 mg/dL — ABNORMAL HIGH (ref 65–139)
Potassium: 4.3 mmol/L (ref 3.5–5.3)
SODIUM: 141 mmol/L (ref 135–146)

## 2017-04-29 LAB — CBC WITH DIFFERENTIAL/PLATELET
BASOS PCT: 0.8 %
Basophils Absolute: 68 cells/uL (ref 0–200)
EOS ABS: 77 {cells}/uL (ref 15–500)
Eosinophils Relative: 0.9 %
HCT: 44.1 % (ref 38.5–50.0)
HEMOGLOBIN: 15.4 g/dL (ref 13.2–17.1)
Lymphs Abs: 1938 cells/uL (ref 850–3900)
MCH: 31.6 pg (ref 27.0–33.0)
MCHC: 34.9 g/dL (ref 32.0–36.0)
MCV: 90.6 fL (ref 80.0–100.0)
MPV: 10.5 fL (ref 7.5–12.5)
Monocytes Relative: 7 %
NEUTROS ABS: 5823 {cells}/uL (ref 1500–7800)
Neutrophils Relative %: 68.5 %
Platelets: 296 10*3/uL (ref 140–400)
RBC: 4.87 10*6/uL (ref 4.20–5.80)
RDW: 12.3 % (ref 11.0–15.0)
TOTAL LYMPHOCYTE: 22.8 %
WBC: 8.5 10*3/uL (ref 3.8–10.8)
WBCMIX: 595 {cells}/uL (ref 200–950)

## 2017-04-29 MED ORDER — AMOXICILLIN-POT CLAVULANATE 875-125 MG PO TABS: 1.0000 | ORAL_TABLET | Freq: Two times a day (BID) | ORAL | 0 refills | Status: DC

## 2017-04-29 MED ORDER — MUPIROCIN 2 % EX OINT: TOPICAL_OINTMENT | CUTANEOUS | 2 refills | Status: DC

## 2017-04-29 NOTE — Progress Notes (Signed)
Careteam: Patient Care Team: Nicholas Curry, DO as PCP - General (Geriatric Medicine) Tat, Eustace Quail, DO as Consulting Physician (Neurology) Latanya Maudlin, MD as Consulting Physician (Orthopedic Surgery) Calvert Cantor, MD as Consulting Physician (Ophthalmology) Sable Feil, MD as Consulting Physician (Gastroenterology)  Advanced Directive information    Allergies  Allergen Reactions  . Caffeine Swelling and Other (See Comments)    Reaction:  Joint swelling    Chief Complaint  Patient presents with  . Acute Visit    Pt is being seen due to chest congestion, cough with green to black sputum for 2 weeks. Pt was treated for pneumonia in jan-Feb at [facility].   . Other    Wife in room     HPI: Patient is a 84 y.o. male seen in the office today due to chest congestion and cough for 2 weeks.  Wife reports he was seen in Feb at urgent care with increase cough and congestion and did chest xray which did not reveal pneumonia but treated him with doxycycline and a shot in office.  Wife reports he got better but pt reports he does not feel like he did.  Wife states cough and congestion has worsen over ~ 2 weeks.  Pt with hx of aspiration, has suction machine he uses at home. Has not taken anything for cough.  No fever or chills.  No recent tylenol, always states he does not need per wife.  States he does have some shortness of breath with activity due to being so congested. Then states he does not know, he unsure if he is or not Wife states sometimes he is more confused than others. Has not seen him short of breath or increase RR.    Review of Systems:  Review of Systems  Constitutional: Negative for chills, fever and malaise/fatigue.  HENT: Negative for congestion, ear discharge, hearing loss, sinus pain and sore throat.   Respiratory: Positive for cough, sputum production, shortness of breath and wheezing (wife reports wheezing at night, cough also worse at  night).        Pt in hospital bed at home, sleep in bedroom with wife  Cardiovascular: Negative for chest pain and leg swelling.  Musculoskeletal:       Progressive parkinsonism plus syndrome  Neurological: Positive for weakness.    Past Medical History:  Diagnosis Date  . Abnormality of gait   . Arthritis   . Bifascicular block   . Cyanocobalamin deficiency   . Depression   . Diverticulitis   . Dizzy   . Dysphagia, idiopathic    History of aspiration  . Gait disturbance   . Hearing loss of both ears   . Hyperlipidemia   . Hypertension   . Lumbar back pain   . Memory loss   . Neck pain   . Osteoarthritis   . Osteoporosis   . Other and unspecified hyperlipidemia   . Parkinsonism ([place])   . Recurrent falls    Using a cane and walker  . Restless leg   . Restless legs syndrome (RLS)   . Right knee pain   . Shoulder pain   . Thoracic back pain   . Thyroid disease   . Type 2 diabetes mellitus with autonomic neuropathy ([place])   . Unspecified essential hypertension   . Unspecified hypothyroidism   . Vitamin D deficiency    Past Surgical History:  Procedure Laterality Date  . CATARACT EXTRACTION Bilateral 2012   Dr.  Digby   Social History:   reports that he quit smoking about 43 years ago. he has never used smokeless tobacco. He reports that he does not drink alcohol or use drugs.  Family History  Problem Relation Age of Onset  . Heart disease Mother   . Diabetes Mother   . Stroke Mother   . Cancer Father        prostate  . Diabetes Sister   . Heart disease Brother     Medications: Patient's Medications  New Prescriptions   No medications on file  Previous Medications   ACETAMINOPHEN (TYLENOL) 500 MG TABLET    Take 1,000 mg by mouth every 6 (six) hours as needed for moderate pain or fever.   AMLODIPINE (NORVASC) 5 MG TABLET    Take 1 tablet (5 mg total) by mouth daily.   ASPIRIN EC 81 MG TABLET    Take 81 mg by mouth daily.   BENAZEPRIL (LOTENSIN) 5 MG  TABLET    Take 1 tablet (5 mg total) by mouth daily.   BLOOD GLUCOSE MONITORING SUPPL (TRUE METRIX AIR GLUCOSE METER) W/DEVICE KIT    1 each by Does not apply route 3 (three) times daily. Use to test blood sugar three times daily. Dx: E11.43   CALCIUM CARBONATE-VITAMIN D (CALCIUM-CARB 600 + D) 600-125 MG-UNIT TABS    Take 1 tablet by mouth 2 (two) times daily.    CARBIDOPA-LEVODOPA (SINEMET IR) 25-100 MG TABLET    Take 3 tablets by mouth 2 (two) times daily.   CYANOCOBALAMIN (,VITAMIN B-12,) 1000 MCG/ML INJECTION    Inject 1 mL (1,000 mcg total) into the muscle every 30 (thirty) days. Pt receives on the 15th of every month.   DICLOFENAC SODIUM (VOLTAREN) 1 % GEL    Apply 4 g topically 4 (four) times daily. To affected knee   GABAPENTIN (NEURONTIN) 300 MG CAPSULE    Take 1 capsule (300 mg total) by mouth at bedtime.   GLUCOSE BLOOD TEST STRIP    True Metrix Test Strips Use to test blood sugar three times daily. Dx:E11.43   METFORMIN (GLUCOPHAGE) 1000 MG TABLET    Take one tablet by mouth twice daily with a meal   MIRTAZAPINE (REMERON) 30 MG TABLET    Take one tablet by mouth once daily at bedtime   MUPIROCIN OINTMENT (BACTROBAN) 2 %    APPLY  OINTMENT TOPICALLY AT BEDTIME TO  LESION  ON  SCROTUM   PANTOPRAZOLE (PROTONIX) 40 MG TABLET    Take one tablet by mouth twice daily   PRAVASTATIN (PRAVACHOL) 20 MG TABLET    Take one tablet by mouth at bedtime   TAMSULOSIN (FLOMAX) 0.4 MG CAPS CAPSULE    Take 1 capsule (0.4 mg total) by mouth at bedtime.   TRUEPLUS LANCETS 33G MISC    Use to test blood sugar three times daily. Dx: E11.43  Modified Medications   No medications on file  Discontinued Medications   CIPROFLOXACIN (CIPRO) 500 MG TABLET    Take 500 mg by mouth 2 (two) times daily.      Physical Exam:  Vitals:   04/29/17 1425  BP: 108/74  Pulse: 70  Temp: 97.9 F (36.6 C)  TempSrc: Oral  SpO2: 99%  Weight: 159 lb (72.1 kg)  Height: '6\' 1"'  (1.854 m)   Body mass index is 20.98  kg/m.  Physical Exam  Constitutional: He is oriented to person, place, and time. He appears well-developed and well-nourished. No distress.  HENT:  Head: Normocephalic and atraumatic.  Right Ear: External ear normal.  Left Ear: External ear normal.  Nose: Nose normal.  Mouth/Throat: Oropharynx is clear and moist. No oropharyngeal exudate.  Hearing aids  Eyes: Pupils are equal, round, and reactive to light.  glasses  Cardiovascular: Normal rate, regular rhythm and normal heart sounds.  Pulmonary/Chest: Effort normal and breath sounds normal. No respiratory distress. He has no wheezes.  Abdominal: Soft. Bowel sounds are normal. He exhibits no distension. There is no tenderness.  Musculoskeletal: He exhibits no edema.  Uses wheelchair   Neurological: He is alert and oriented to person, place, and time.  Poor historian, his wife gives most of history   Skin: Skin is warm and dry.  Bruising noted over right eye  Psychiatric:  Flat affect    Labs reviewed: Basic Metabolic Panel: Recent Labs    10/20/16 0955  01/25/17 0535 01/26/17 0606 02/10/17 1623  NA 141   < > 140 139 141  K 4.0   < > 2.9* 3.4* 4.7  CL 101   < > 108 108 103  CO2 24   < > '24 26 26  ' GLUCOSE 95   < > 108* 120* 164*  BUN 24   < > 17 12 26*  CREATININE 1.13*   < > 0.90 0.93 1.20*  CALCIUM 9.8   < > 8.1* 8.5* 10.0  MG  --   --  1.2*  --   --   TSH 2.70  --   --   --   --    < > = values in this interval not displayed.   Liver Function Tests: Recent Labs    10/20/16 0955 12/08/16 1216 01/22/17 1720  AST 13 14 83*  ALT 13 6 34  ALKPHOS 78 90 84  BILITOT 0.6 0.4 1.3*  PROT 6.8 6.7 7.1  ALBUMIN 4.3 4.2 3.9   Recent Labs    01/22/17 1720  LIPASE 21   No results for input(s): AMMONIA in the last 8760 hours. CBC: Recent Labs    01/22/17 1720 01/25/17 0535 02/10/17 1623  WBC 14.9* 5.1 9.8  NEUTROABS 13.2*  --  6,595  HGB 15.5 12.5* 14.3  HCT 45.3 37.8* 42.6  MCV 94.2 93.3 93.0  PLT 206 167  373   Lipid Panel: Recent Labs    10/20/16 0955  CHOL 154  HDL 51  LDLCALC 83  TRIG 99  CHOLHDL 3.0   TSH: Recent Labs    10/20/16 0955  TSH 2.70   A1C: Lab Results  Component Value Date   HGBA1C 5.2 10/20/2016     Assessment/Plan 1. Acute bronchitis, unspecified organism -recently treated with doxycycline 100 mg BID at urgent care cough had initially improved but now worse so wife is concerned, lungs CTA, no cough during OV, VSS however at high risk for aspiration pneumonia.  - DG Chest 2 View- rule out PNA, continue aspiration precautions -mucinex DM by mouth twice daily  - amoxicillin-clavulanate (AUGMENTIN) 875-125 MG tablet; Take 1 tablet by mouth 2 (two) times daily.  Dispense: 14 tablet; Refill: 0 - CBC with Differential/Platelets - BASIC METABOLIC PANEL WITH GFR -follow up precautions given  Next appt: 05/20/2017 Janett Billow K. [facility] [phone]

## 2017-04-29 NOTE — Patient Instructions (Signed)
mucinex DM by mouth twice daily with full glass of water for cough and congestion  Increase water intake augmentin 875-125 mg by mouth twice daily for 1 week  Acute Bronchitis, Adult Acute bronchitis is when air tubes (bronchi) in the lungs suddenly get swollen. The condition can make it hard to breathe. It can also cause these symptoms:  A cough.  Coughing up clear, yellow, or green mucus.  Wheezing.  Chest congestion.  Shortness of breath.  A fever.  Body aches.  Chills.  A sore throat.  Follow these instructions at home: Medicines  Take over-the-counter and prescription medicines only as told by your doctor.  If you were prescribed an antibiotic medicine, take it as told by your doctor. Do not stop taking the antibiotic even if you start to feel better. General instructions  Rest.  Drink enough fluids to keep your pee (urine) clear or pale yellow.  Avoid smoking and secondhand smoke. If you smoke and you need help quitting, ask your doctor. Quitting will help your lungs heal faster.  Use an inhaler, cool mist vaporizer, or humidifier as told by your doctor.  Keep all follow-up visits as told by your doctor. This is important. How is this prevented? To lower your risk of getting this condition again:  Wash your hands often with soap and water. If you cannot use soap and water, use hand sanitizer.  Avoid contact with people who have cold symptoms.  Try not to touch your hands to your mouth, nose, or eyes.  Make sure to get the flu shot every year.  Contact a doctor if:  Your symptoms do not get better in 2 weeks. Get help right away if:  You cough up blood.  You have chest pain.  You have very bad shortness of breath.  You become dehydrated.  You faint (pass out) or keep feeling like you are going to pass out.  You keep throwing up (vomiting).  You have a very bad headache.  Your fever or chills gets worse. This information is not intended to  replace advice given to you by your health care provider. Make sure you discuss any questions you have with your health care provider. Document Released: 07/29/2007 Document Revised: 09/18/2015 Document Reviewed: 07/31/2015 Elsevier Interactive Patient Education  [employer]2018 [employer]

## 2017-05-04 ENCOUNTER — Other Ambulatory Visit: Payer: Self-pay | Admitting: Internal Medicine

## 2017-05-09 DIAGNOSIS — R1312 Dysphagia, oropharyngeal phase: Secondary | ICD-10-CM | POA: Diagnosis not present

## 2017-05-09 DIAGNOSIS — G20 Parkinson's disease: Secondary | ICD-10-CM | POA: Diagnosis not present

## 2017-05-11 ENCOUNTER — Telehealth: Payer: Self-pay | Admitting: *Deleted

## 2017-05-11 NOTE — Telephone Encounter (Signed)
Received fax from [facility] for [facility]. Contact patient and supplies were not requested from this pharmacy. Denied per patient request.

## 2017-05-20 ENCOUNTER — Encounter: Payer: Self-pay | Admitting: Internal Medicine

## 2017-05-20 ENCOUNTER — Ambulatory Visit (INDEPENDENT_AMBULATORY_CARE_PROVIDER_SITE_OTHER): Payer: Medicare Other

## 2017-05-20 ENCOUNTER — Ambulatory Visit (INDEPENDENT_AMBULATORY_CARE_PROVIDER_SITE_OTHER): Payer: Medicare Other | Admitting: Internal Medicine

## 2017-05-20 VITALS — BP 114/70 | HR 88 | Temp 97.5°F | Ht 73.0 in | Wt 163.0 lb

## 2017-05-20 DIAGNOSIS — Z0000 Encounter for general adult medical examination without abnormal findings: Secondary | ICD-10-CM | POA: Diagnosis not present

## 2017-05-20 DIAGNOSIS — K5900 Constipation, unspecified: Secondary | ICD-10-CM

## 2017-05-20 DIAGNOSIS — E1143 Type 2 diabetes mellitus with diabetic autonomic (poly)neuropathy: Secondary | ICD-10-CM

## 2017-05-20 DIAGNOSIS — G214 Vascular parkinsonism: Secondary | ICD-10-CM | POA: Diagnosis not present

## 2017-05-20 DIAGNOSIS — R1319 Other dysphagia: Secondary | ICD-10-CM

## 2017-05-20 DIAGNOSIS — R413 Other amnesia: Secondary | ICD-10-CM | POA: Diagnosis not present

## 2017-05-20 DIAGNOSIS — I10 Essential (primary) hypertension: Secondary | ICD-10-CM

## 2017-05-20 DIAGNOSIS — R269 Unspecified abnormalities of gait and mobility: Secondary | ICD-10-CM

## 2017-05-20 NOTE — Patient Instructions (Addendum)
Nicholas Barnett , Thank you for taking time to come for your Medicare Wellness Visit. I appreciate your ongoing commitment to your health goals. Please review the following plan we discussed and let me know if I can assist you in the future.   Screening recommendations/referrals: Colonoscopy excluded, over age 82 Recommended yearly ophthalmology/optometry visit for glaucoma screening and checkup Recommended yearly dental visit for hygiene and checkup  Vaccinations: Influenza vaccine up to date, due 2019 fall season Pneumococcal vaccine up to date, completed Tdap vaccine due, prescription sent to pharmacy Shingles vaccine due, declined    Advanced directives: in chart  Conditions/risks identified: none  Next appointment: Nicholas RussellSara Athleen Feltner, RN 05/23/2018 @ 2:30pm  Preventive Care 65 Years and Older, Male Preventive care refers to lifestyle choices and visits with your health care provider that can promote health and wellness. What does preventive care include?  A yearly physical exam. This is also called an annual well check.  Dental exams once or twice a year.  Routine eye exams. Ask your health care provider how often you should have your eyes checked.  Personal lifestyle choices, including:  Daily care of your teeth and gums.  Regular physical activity.  Eating a healthy diet.  Avoiding tobacco and drug use.  Limiting alcohol use.  Practicing safe sex.  Taking low doses of aspirin every day.  Taking vitamin and mineral supplements as recommended by your health care provider. What happens during an annual well check? The services and screenings done by your health care provider during your annual well check will depend on your age, overall health, lifestyle risk factors, and family history of disease. Counseling  Your health care provider may ask you questions about your:  Alcohol use.  Tobacco use.  Drug use.  Emotional well-being.  Home and relationship  well-being.  Sexual activity.  Eating habits.  History of falls.  Memory and ability to understand (cognition).  Work and work Astronomerenvironment. Screening  You may have the following tests or measurements:  Height, weight, and BMI.  Blood pressure.  Lipid and cholesterol levels. These may be checked every 5 years, or more frequently if you are over 82 years old.  Skin check.  Lung cancer screening. You may have this screening every year starting at age 82 if you have a 30-pack-year history of smoking and currently smoke or have quit within the past 15 years.  Fecal occult blood test (FOBT) of the stool. You may have this test every year starting at age 82.  Flexible sigmoidoscopy or colonoscopy. You may have a sigmoidoscopy every 5 years or a colonoscopy every 10 years starting at age 82.  Prostate cancer screening. Recommendations will vary depending on your family history and other risks.  Hepatitis C blood test.  Hepatitis B blood test.  Sexually transmitted disease (STD) testing.  Diabetes screening. This is done by checking your blood sugar (glucose) after you have not eaten for a while (fasting). You may have this done every 1-3 years.  Abdominal aortic aneurysm (AAA) screening. You may need this if you are a current or former smoker.  Osteoporosis. You may be screened starting at age 82 if you are at high risk. Talk with your health care provider about your test results, treatment options, and if necessary, the need for more tests. Vaccines  Your health care provider may recommend certain vaccines, such as:  Influenza vaccine. This is recommended every year.  Tetanus, diphtheria, and acellular pertussis (Tdap, Td) vaccine. You may need a  Td booster every 10 years.  Zoster vaccine. You may need this after age 16.  Pneumococcal 13-valent conjugate (PCV13) vaccine. One dose is recommended after age 23.  Pneumococcal polysaccharide (PPSV23) vaccine. One dose is  recommended after age 60. Talk to your health care provider about which screenings and vaccines you need and how often you need them. This information is not intended to replace advice given to you by your health care provider. Make sure you discuss any questions you have with your health care provider. Document Released: 03/08/2015 Document Revised: 10/30/2015 Document Reviewed: 12/11/2014 Elsevier Interactive Patient Education  2017 [place] Prevention in the Home Falls can cause injuries. They can happen to people of all ages. There are many things you can do to make your home safe and to help prevent falls. What can I do on the outside of my home?  Regularly fix the edges of walkways and driveways and fix any cracks.  Remove anything that might make you trip as you walk through a door, such as a raised step or threshold.  Trim any bushes or trees on the path to your home.  Use bright outdoor lighting.  Clear any walking paths of anything that might make someone trip, such as rocks or tools.  Regularly check to see if handrails are loose or broken. Make sure that both sides of any steps have handrails.  Any raised decks and porches should have guardrails on the edges.  Have any leaves, snow, or ice cleared regularly.  Use sand or salt on walking paths during winter.  Clean up any spills in your garage right away. This includes oil or grease spills. What can I do in the bathroom?  Use night lights.  Install grab bars by the toilet and in the tub and shower. Do not use towel bars as grab bars.  Use non-skid mats or decals in the tub or shower.  If you need to sit down in the shower, use a plastic, non-slip stool.  Keep the floor dry. Clean up any water that spills on the floor as soon as it happens.  Remove soap buildup in the tub or shower regularly.  Attach bath mats securely with double-sided non-slip rug tape.  Do not have throw rugs and other things on  the floor that can make you trip. What can I do in the bedroom?  Use night lights.  Make sure that you have a light by your bed that is easy to reach.  Do not use any sheets or blankets that are too big for your bed. They should not hang down onto the floor.  Have a firm chair that has side arms. You can use this for support while you get dressed.  Do not have throw rugs and other things on the floor that can make you trip. What can I do in the kitchen?  Clean up any spills right away.  Avoid walking on wet floors.  Keep items that you use a lot in easy-to-reach places.  If you need to reach something above you, use a strong step stool that has a grab bar.  Keep electrical cords out of the way.  Do not use floor polish or wax that makes floors slippery. If you must use wax, use non-skid floor wax.  Do not have throw rugs and other things on the floor that can make you trip. What can I do with my stairs?  Do not leave any items on the stairs.  Make sure that there are handrails on both sides of the stairs and use them. Fix handrails that are broken or loose. Make sure that handrails are as long as the stairways.  Check any carpeting to make sure that it is firmly attached to the stairs. Fix any carpet that is loose or worn.  Avoid having throw rugs at the top or bottom of the stairs. If you do have throw rugs, attach them to the floor with carpet tape.  Make sure that you have a light switch at the top of the stairs and the bottom of the stairs. If you do not have them, ask someone to add them for you. What else can I do to help prevent falls?  Wear shoes that:  Do not have high heels.  Have rubber bottoms.  Are comfortable and fit you well.  Are closed at the toe. Do not wear sandals.  If you use a stepladder:  Make sure that it is fully opened. Do not climb a closed stepladder.  Make sure that both sides of the stepladder are locked into place.  Ask someone to  hold it for you, if possible.  Clearly mark and make sure that you can see:  Any grab bars or handrails.  First and last steps.  Where the edge of each step is.  Use tools that help you move around (mobility aids) if they are needed. These include:  Canes.  Walkers.  Scooters.  Crutches.  Turn on the lights when you go into a dark area. Replace any light bulbs as soon as they burn out.  Set up your furniture so you have a clear path. Avoid moving your furniture around.  If any of your floors are uneven, fix them.  If there are any pets around you, be aware of where they are.  Review your medicines with your doctor. Some medicines can make you feel dizzy. This can increase your chance of falling. Ask your doctor what other things that you can do to help prevent falls. This information is not intended to replace advice given to you by your health care provider. Make sure you discuss any questions you have with your health care provider. Document Released: 12/06/2008 Document Revised: 07/18/2015 Document Reviewed: 03/16/2014 Elsevier Interactive Patient Education  2017 [employer].

## 2017-05-20 NOTE — Progress Notes (Signed)
Subjective:   Nicholas Barnett is a 84 y.o. male who presents for Medicare Annual/Subsequent preventive examination.  Last AWV-04/14/2016    Objective:    Vitals: BP 114/70 (BP Location: Left Arm, Patient Position: Sitting)   Pulse 88   Temp (!) 97.5 F (36.4 C) (Oral)   Ht '6\' 1"'  (1.854 m)   Wt 163 lb (73.9 kg)   SpO2 96%   BMI 21.51 kg/m   Body mass index is 21.51 kg/m.  Advanced Directives 05/20/2017 02/10/2017 01/22/2017 12/28/2016 10/22/2016 04/14/2016 04/14/2016  Does Patient Have a Medical Advance Directive? Yes Yes Yes Yes Yes Yes Yes  Type of Advance Directive Living will [place];Living will [place];Living will [place];Living will [place];Living will Living will;Healthcare Power of [place]  Does patient want to make changes to medical advance directive? No - Patient declined - No - Patient declined No - Patient declined - - -  Copy of [place] in Chart? - - Yes Yes Yes - Yes    Tobacco Social History   Tobacco Use  Smoking Status Former Smoker  . Last attempt to quit: 01/31/1974  . Years since quitting: 43.3  Smokeless Tobacco Never Used     Counseling given: Not Answered   Clinical Intake:  Pre-visit preparation completed: No  Pain : 0-10 Pain Score: 7  Pain Type: Chronic pain Pain Location: Shoulder Pain Orientation: Right, Left Pain Descriptors / Indicators: Aching Pain Onset: More than a month ago Pain Frequency: Constant     Nutritional Risks: None Diabetes: No  How often do you need to have someone help you when you read instructions, pamphlets, or other written materials from your doctor or pharmacy?: 1 - Never What is the last grade level you completed in school?: College  Interpreter Needed?: No  Information entered by :: Tyson Dense, RN  Past Medical History:  Diagnosis Date  . Abnormality of gait   .  Arthritis   . Bifascicular block   . Cyanocobalamin deficiency   . Depression   . Diverticulitis   . Dizzy   . Dysphagia, idiopathic    History of aspiration  . Gait disturbance   . Hearing loss of both ears   . Hyperlipidemia   . Hypertension   . Lumbar back pain   . Memory loss   . Neck pain   . Osteoarthritis   . Osteoporosis   . Other and unspecified hyperlipidemia   . Parkinsonism ([place])   . Recurrent falls    Using a cane and walker  . Restless leg   . Restless legs syndrome (RLS)   . Right knee pain   . Shoulder pain   . Thoracic back pain   . Thyroid disease   . Type 2 diabetes mellitus with autonomic neuropathy ([place])   . Unspecified essential hypertension   . Unspecified hypothyroidism   . Vitamin D deficiency    Past Surgical History:  Procedure Laterality Date  . CATARACT EXTRACTION Bilateral 2012   Dr. Bing Plume   Family History  Problem Relation Age of Onset  . Heart disease Mother   . Diabetes Mother   . Stroke Mother   . Cancer Father        prostate  . Diabetes Sister   . Heart disease Brother    Social History   Socioeconomic History  . Marital status: Married    Spouse name: Inez Catalina  .  Number of children: 3  . Years of education: College  . Highest education level: Not on file  Occupational History  . Occupation: Retired    Fish farm manager: RETIRED    Comment: Manager/ [employer]  . Financial resource strain: Not hard at all  . Food insecurity:    Worry: Never true    Inability: Never true  . Transportation needs:    Medical: No    Non-medical: No  Tobacco Use  . Smoking status: Former Smoker    Last attempt to quit: 01/31/1974    Years since quitting: 43.3  . Smokeless tobacco: Never Used  Substance and Sexual Activity  . Alcohol use: No    Alcohol/week: 0.0 oz  . Drug use: No  . Sexual activity: Never  Lifestyle  . Physical activity:    Days per week: 0 days    Minutes per session: 0 min  . Stress: To some extent    Relationships  . Social connections:    Talks on phone: More than three times a week    Gets together: More than three times a week    Attends religious service: Never    Active member of club or organization: No    Attends meetings of clubs or organizations: Never    Relationship status: Married  Other Topics Concern  . Not on file  Social History Narrative   Married 1956. 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   Patient lives at home with his family.   Caffeine Use: none   Patient has a living will and healthcare power of attorney.    Outpatient Encounter Medications as of 05/20/2017  Medication Sig  . acetaminophen (TYLENOL) 500 MG tablet Take 1,000 mg by mouth every 6 (six) hours as needed for moderate pain or fever.  Marland Kitchen amLODipine (NORVASC) 5 MG tablet Take 1 tablet (5 mg total) by mouth daily.  Marland Kitchen aspirin EC 81 MG tablet Take 81 mg by mouth daily.  . benazepril (LOTENSIN) 5 MG tablet Take 1 tablet (5 mg total) by mouth daily.  . Blood Glucose Monitoring Suppl (TRUE METRIX AIR GLUCOSE METER) w/Device KIT 1 each by Does not apply route 3 (three) times daily. Use to test blood sugar three times daily. Dx: E11.43  . Calcium Carbonate-Vitamin D (CALCIUM-CARB 600 + D) 600-125 MG-UNIT TABS Take 1 tablet by mouth 2 (two) times daily.   . carbidopa-levodopa (SINEMET IR) 25-100 MG tablet Take 3 tablets by mouth 2 (two) times daily.  . cyanocobalamin (,VITAMIN B-12,) 1000 MCG/ML injection Inject 1 mL (1,000 mcg total) into the muscle every 30 (thirty) days. Pt receives on the 15th of every month.  . diclofenac sodium (VOLTAREN) 1 % GEL Apply 4 g topically 4 (four) times daily. To affected knee  . gabapentin (NEURONTIN) 300 MG capsule Take 1 capsule (300 mg total) by mouth at bedtime.  Marland Kitchen glucose blood test strip True Metrix Test Strips Use to test blood sugar three times daily. Dx:E11.43  . metFORMIN (GLUCOPHAGE) 1000 MG tablet Take one tablet by mouth twice daily with a meal  . mirtazapine  (REMERON) 30 MG tablet Take one tablet by mouth once daily at bedtime  . mupirocin ointment (BACTROBAN) 2 % APPLY  OINTMENT TOPICALLY AT BEDTIME TO  LESION  ON  SCROTUM  . pantoprazole (PROTONIX) 40 MG tablet Take one tablet by mouth twice daily  . pravastatin (PRAVACHOL) 20 MG tablet Take one tablet by mouth at bedtime  . tamsulosin (FLOMAX) 0.4  MG CAPS capsule Take 1 capsule (0.4 mg total) by mouth at bedtime.  . TRUEPLUS LANCETS 33G MISC Use to test blood sugar three times daily. Dx: E11.43  . [DISCONTINUED] amoxicillin-clavulanate (AUGMENTIN) 875-125 MG tablet Take 1 tablet by mouth 2 (two) times daily.   No facility-administered encounter medications on file as of 05/20/2017.     Activities of Daily Living In your present state of health, do you have any difficulty performing the following activities: 05/20/2017 01/22/2017  Hearing? N Y  Vision? N N  Difficulty concentrating or making decisions? Tempie Donning  Walking or climbing stairs? Y Y  Dressing or bathing? Y Y  Doing errands, shopping? Tempie Donning  Preparing Food and eating ? Y -  Using the Toilet? Y -  In the past six months, have you accidently leaked urine? Y -  Do you have problems with loss of bowel control? N -  Managing your Medications? Y -  Managing your Finances? Y -  Housekeeping or managing your Housekeeping? Y -  Some recent data might be hidden    Patient Care Team: Gayland Curry, DO as PCP - General (Geriatric Medicine) Tat, Eustace Quail, DO as Consulting Physician (Neurology) Latanya Maudlin, MD as Consulting Physician (Orthopedic Surgery) Calvert Cantor, MD as Consulting Physician (Ophthalmology) Sable Feil, MD as Consulting Physician (Gastroenterology)   Assessment:   This is a routine wellness examination for [place].  Exercise Activities and Dietary recommendations Current Exercise Habits: The patient does not participate in regular exercise at present, Exercise limited by: orthopedic condition(s)  Goals     None      Fall Risk Fall Risk  05/20/2017 04/29/2017 02/10/2017 12/28/2016 12/08/2016  Falls in the past year? Yes No Yes Yes Yes  Number falls in past yr: 2 or more - 2 or more 2 or more 2 or more  Comment - - - - -  Injury with Fall? Yes - No No -  Comment shoulder and knee - - - -  Risk Factor Category  - - - - -  Risk for fall due to : - - - - -  Follow up - - - - -   Is the patient's home free of loose throw rugs in walkways, pet beds, electrical cords, etc?   yes      Grab bars in the bathroom? yes      Handrails on the stairs?   yes      Adequate lighting?   yes  Timed Get Up and Go Performed: unable to perform, pt is in a wheelchair and does not have a walker  Depression Screen PHQ 2/9 Scores 05/20/2017 12/08/2016 04/14/2016 10/16/2015  PHQ - 2 Score 1 0 0 0  PHQ- 9 Score - - - -    Cognitive Function MMSE - Mini Mental State Exam 05/20/2017 04/14/2016 04/26/2013  Orientation to time '5 5 4  ' Orientation to Place '4 5 5  ' Registration '3 3 3  ' Attention/ Calculation 0 5 0  Recall '2 3 2  ' Language- name 2 objects '2 2 2  ' Language- repeat '1 1 1  ' Language- follow 3 step command '3 3 1  ' Language- read & follow direction 1 1 0  Language-read & follow direction-comments - - unable to due to Parkinsons Disease  Write a sentence 1 0 0  Write a sentence-comments - Unable to write due to Parkinson's.  unable to due to Parkinsons Disease  Copy design 1 0 0  Copy design-comments -  Unable to write due to Parkinson's.  unable to due to Parkinsons Disease  Total score '23 28 18        ' Immunization History  Administered Date(s) Administered  . Influenza Split 12/08/2011  . Influenza Whole 11/25/2005, 11/24/2006  . Influenza,inj,Quad PF,6+ Mos 11/07/2012, 12/08/2016  . Pneumococcal Conjugate-13 10/17/2014  . Pneumococcal Polysaccharide-23 12/22/2007  . Td 11/22/2003    Qualifies for Shingles Vaccine? Yes, educated and declined  Screening Tests Health Maintenance  Topic Date Due  .  COLONOSCOPY  11/23/2004  . TETANUS/TDAP  11/21/2013  . OPHTHALMOLOGY EXAM  03/23/2015  . HEMOGLOBIN A1C  04/22/2017  . FOOT EXAM  10/22/2017  . INFLUENZA VACCINE  Completed  . PNA vac Low Risk Adult  Completed   Cancer Screenings: Lung: Low Dose CT Chest recommended if Age 2-80 years, 30 pack-year currently smoking OR have quit w/in 15years. Patient does not qualify. Colorectal: up to date  Additional Screenings: Hepatitis C Screening: declined Next eye appointment scheduled    Plan:    I have personally reviewed and addressed the Medicare Annual Wellness questionnaire and have noted the following in the patient's chart:  A. Medical and social history B. Use of alcohol, tobacco or illicit drugs  C. Current medications and supplements D. Functional ability and status E.  Nutritional status F.  Physical activity G. Advance directives H. List of other physicians I.  Hospitalizations, surgeries, and ER visits in previous 12 months J.  [place] to include hearing, vision, cognitive, depression L. Referrals and appointments - none  In addition, I have reviewed and discussed with patient certain preventive protocols, quality metrics, and best practice recommendations. A written personalized care plan for preventive services as well as general preventive health recommendations were provided to patient.  See attached scanned questionnaire for additional information.   Signed,   Tyson Dense, RN Nurse Health Advisor  Patient concerns: Bursitis in shoulders

## 2017-05-20 NOTE — Progress Notes (Signed)
Location:  [facility] clinic Provider:  Tiffany L. Mariea Clonts, D.O., C.M.D.  Code Status: DNR  Goals of Care:  Advanced Directives 05/20/2017  Does Patient Have a Medical Advance Directive? Yes  Type of Advance Directive Living will  Does patient want to make changes to medical advance directive? No - Patient declined  Copy of [place] in Chart? -     Chief Complaint  Patient presents with  . Medical Management of Chronic Issues    [facility] follow-up    HPI: Patient is a 84y.o. male seen today for medical management of chronic diseases.     Parkinson's: Followed by Dr. TCarles Collet but has not been seen in a while. Wife notices increase in forgetfulness. He uses a scooter at home to get around, limited on walking. FGolden Circlethis morning (slid out of bed). Reports no injury with this. Does have a hospital bed with trapeze bard. Also have a ramp to get in and out of home.   DM II with neuropathy: Blood sugars average is around 100. Is on metformin. Reports eating okay, has gained about 4 pounds since last visit. Supplements with Glucerna. Drinking water, might not be enough-per wife.  Dysphagia: Coughs a lot. Good mouth care. Dry mouth interventions. He has continued a rattle in his chest that stresses him and his wife.  He has had several checks for aspiration pneumonia as has happened often. The last check on 04/29/2017 was negative for findings.     Past Medical History:  Diagnosis Date  . Abnormality of gait   . Arthritis   . Bifascicular block   . Cyanocobalamin deficiency   . Depression   . Diverticulitis   . Dizzy   . Dysphagia, idiopathic    History of aspiration  . Gait disturbance   . Hearing loss of both ears   . Hyperlipidemia   . Hypertension   . Lumbar back pain   . Memory loss   . Neck pain   . Osteoarthritis   . Osteoporosis   . Other and unspecified hyperlipidemia   . Parkinsonism ([place]   . Recurrent falls    Using a cane and walker  . Restless leg   .  Restless legs syndrome (RLS)   . Right knee pain   . Shoulder pain   . Thoracic back pain   . Thyroid disease   . Type 2 diabetes mellitus with autonomic neuropathy ([place]   . Unspecified essential hypertension   . Unspecified hypothyroidism   . Vitamin D deficiency     Past Surgical History:  Procedure Laterality Date  . CATARACT EXTRACTION Bilateral 2012   Dr. DBing Plume   Allergies  Allergen Reactions  . Caffeine Swelling and Other (See Comments)    Reaction:  Joint swelling    Outpatient Encounter Medications as of 05/20/2017  Medication Sig  . acetaminophen (TYLENOL) 500 MG tablet Take 1,000 mg by mouth every 6 (six) hours as needed for moderate pain or fever.  .Marland KitchenamLODipine (NORVASC) 5 MG tablet Take 1 tablet (5 mg total) by mouth daily.  .Marland Kitchenaspirin EC 81 MG tablet Take 81 mg by mouth daily.  . benazepril (LOTENSIN) 5 MG tablet Take 1 tablet (5 mg total) by mouth daily.  . Blood Glucose Monitoring Suppl (TRUE METRIX AIR GLUCOSE METER) w/Device KIT 1 each by Does not apply route 3 (three) times daily. Use to test blood sugar three times daily. Dx: E11.43  . Calcium Carbonate-Vitamin D (CALCIUM-CARB 600 +  D) 600-125 MG-UNIT TABS Take 1 tablet by mouth 2 (two) times daily.   . carbidopa-levodopa (SINEMET IR) 25-100 MG tablet Take 3 tablets by mouth 2 (two) times daily.  . cyanocobalamin (,VITAMIN B-12,) 1000 MCG/ML injection Inject 1 mL (1,000 mcg total) into the muscle every 30 (thirty) days. Pt receives on the 15th of every month.  . diclofenac sodium (VOLTAREN) 1 % GEL Apply 4 g topically 4 (four) times daily. To affected knee  . gabapentin (NEURONTIN) 300 MG capsule Take 1 capsule (300 mg total) by mouth at bedtime.  Marland Kitchen glucose blood test strip True Metrix Test Strips Use to test blood sugar three times daily. Dx:E11.43  . metFORMIN (GLUCOPHAGE) 1000 MG tablet Take one tablet by mouth twice daily with a meal  . mirtazapine (REMERON) 30 MG tablet Take one tablet by mouth once daily  at bedtime  . mupirocin ointment (BACTROBAN) 2 % APPLY  OINTMENT TOPICALLY AT BEDTIME TO  LESION  ON  SCROTUM  . pantoprazole (PROTONIX) 40 MG tablet Take one tablet by mouth twice daily  . pravastatin (PRAVACHOL) 20 MG tablet Take one tablet by mouth at bedtime  . tamsulosin (FLOMAX) 0.4 MG CAPS capsule Take 1 capsule (0.4 mg total) by mouth at bedtime.  . TRUEPLUS LANCETS 33G MISC Use to test blood sugar three times daily. Dx: E11.43   No facility-administered encounter medications on file as of 05/20/2017.     Review of Systems:  Review of Systems  Constitutional: Negative for chills, fever and malaise/fatigue.  HENT: Positive for hearing loss.        Hearing aids   Eyes: Negative for blurred vision and double vision.       Glasses  Respiratory: Positive for cough. Negative for shortness of breath.   Cardiovascular: Negative for chest pain, palpitations and leg swelling.  Gastrointestinal: Positive for constipation. Negative for blood in stool and heartburn.  Genitourinary: Positive for frequency and urgency. Negative for hematuria.       Wear depend brief with a guard inside it   Musculoskeletal: Positive for falls, joint pain and neck pain.       Shoulder and knee (right)   Neurological: Positive for weakness. Negative for dizziness and headaches.       Numbness in feet   Psychiatric/Behavioral: Positive for memory loss. Negative for depression. The patient is nervous/anxious. The patient does not have insomnia.     Health Maintenance  Topic Date Due  . COLONOSCOPY  11/23/2004  . TETANUS/TDAP  11/21/2013  . OPHTHALMOLOGY EXAM  03/23/2015  . HEMOGLOBIN A1C  04/22/2017  . FOOT EXAM  10/22/2017  . INFLUENZA VACCINE  Completed  . PNA vac Low Risk Adult  Completed    Physical Exam: Vitals:   05/20/17 1321  BP: 114/70  Pulse: 88  Temp: (!) 97.5 F (36.4 C)  TempSrc: Oral  SpO2: 96%  Weight: 163 lb (73.9 kg)  Height: '6\' 1"'  (1.854 m)   Body mass index is 21.51  kg/m. Physical Exam  Constitutional: He appears well-developed.  HENT:  Head: Normocephalic.  Right Ear: External ear normal.  Left Ear: External ear normal.  Nose: Nose normal.  Hearing aids   Eyes: Conjunctivae are normal. Right eye exhibits no discharge. Left eye exhibits no discharge.  glasses  Neck: Normal range of motion. Neck supple.  Cardiovascular: Normal rate, regular rhythm, normal heart sounds, intact distal pulses and normal pulses.  Pulmonary/Chest: Effort normal. He has rhonchi in the right upper field and the  right middle field.  Abdominal: Soft. Bowel sounds are normal.  Musculoskeletal:  Use of wheelchair   Neurological: He is alert.  Poor historian wife reports history  Skin: Skin is warm and dry. Capillary refill takes less than 2 seconds.  Psychiatric:  Flat affect, pleasant in conversation  Vitals reviewed.   Labs reviewed: Basic Metabolic Panel: Recent Labs    10/20/16 0955  01/25/17 0535 01/26/17 0606 02/10/17 1623 04/29/17 1455  NA 141   < > 140 139 141 141  K 4.0   < > 2.9* 3.4* 4.7 4.3  CL 101   < > 108 108 103 101  CO2 24   < > '24 26 26 29  ' GLUCOSE 95   < > 108* 120* 164* 160*  BUN 24   < > 17 12 26* 24  CREATININE 1.13*   < > 0.90 0.93 1.20* 1.12*  CALCIUM 9.8   < > 8.1* 8.5* 10.0 9.8  MG  --   --  1.2*  --   --   --   TSH 2.70  --   --   --   --   --    < > = values in this interval not displayed.   Liver Function Tests: Recent Labs    10/20/16 0955 12/08/16 1216 01/22/17 1720  AST 13 14 83*  ALT 13 6 34  ALKPHOS 78 90 84  BILITOT 0.6 0.4 1.3*  PROT 6.8 6.7 7.1  ALBUMIN 4.3 4.2 3.9   Recent Labs    01/22/17 1720  LIPASE 21   No results for input(s): AMMONIA in the last 8760 hours. CBC: Recent Labs    01/22/17 1720 01/25/17 0535 02/10/17 1623 04/09/17 04/29/17 1455  WBC 14.9* 5.1 9.8 10.0 8.5  NEUTROABS 13.2*  --  6,595  --  5,823  HGB 15.5 12.5* 14.3 15.2 15.4  HCT 45.3 37.8* 42.6 45 44.1  MCV 94.2 93.3 93.0   --  90.6  PLT 206 167 373 271 296   Lipid Panel: Recent Labs    10/20/16 0955  CHOL 154  HDL 51  LDLCALC 83  TRIG 99  CHOLHDL 3.0   Lab Results  Component Value Date   HGBA1C 5.2 10/20/2016    Procedures since last visit: Dg Chest 2 View  Result Date: 04/30/2017 CLINICAL DATA:  Cough, congestion EXAM: CHEST - 2 VIEW COMPARISON:  01/22/2017 FINDINGS: Heart and mediastinal contours are within normal limits. No focal opacities or effusions. No acute bony abnormality. IMPRESSION: No active cardiopulmonary disease. Electronically Signed   By: Rolm Baptise M.D.   On: 04/30/2017 08:08    Assessment/Plan  1. Vascular parkinsonism [facility]) MMSE 23/30 today with Clarise Cruz. He is on sinemet as per neurology. Wife reports worsening of mobility. He feel this morning. Wife thinks she should follow back up with neurology. This is recommended. Appreciate collaboration in his care.   2. Constipation, unspecified constipation type Reports constipation at times. Not on anything at this time. Will continue to monitor.  3. Dysphagia, idiopathic This is persistent, due to parkinsonism. He has continued risk for aspiration. He did not feel of thickened liquids benefited him He has a hard time coughing up anything when it does go the wrong way.    4. Gait difficulty He has some on going trouble, but there is noticed increase in his weakness. Suggest going back to Dr Tat to see if she has some suggestions it is appreciated.   5. Essential hypertension BP is  well controlled. Continue Benazepril and Norvasc.  6. Type 2 diabetes mellitus with diabetic autonomic neuropathy, without long-term current use of insulin (HCC) Well controlled on current regimen. Will continue metformin and home blood sugar checks.  7. Memory loss MMSE 23/30 today on exam with Clarise Cruz for wellness visit. He is more quite than usual per wife. He will just watch the tv mostly now versus talking.    Labs/tests ordered:   Orders Placed  This Encounter  Procedures  . CBC with Differential/Platelet    Standing Status:   Future    Standing Expiration Date:   01/20/2018  . Basic metabolic panel    Standing Status:   Future    Standing Expiration Date:   01/20/2018    Order Specific Question:   Has the patient fasted?    Answer:   Yes  . Hemoglobin A1c    Standing Status:   Future    Standing Expiration Date:   01/20/2018     Next appt:  11/02/2017   Karen Kays, RN, DNP Student Geriatric[facility] [phone], [place] [address] Cell Phone (Mon-Fri 8am-5pm):  [phone] On Call:  [phone] & follow prompts after 5pm & weekends Office Phone:  [phone] Office Fax:  [phone]

## 2017-05-31 ENCOUNTER — Other Ambulatory Visit: Payer: Self-pay | Admitting: *Deleted

## 2017-05-31 MED ORDER — CARBIDOPA-LEVODOPA 25-100 MG PO TABS: 3.0000 | ORAL_TABLET | Freq: Two times a day (BID) | ORAL | 3 refills | Status: DC

## 2017-05-31 MED ORDER — ONETOUCH DELICA LANCETS 33G MISC: 3 refills | Status: DC

## 2017-05-31 NOTE — Telephone Encounter (Signed)
[facility] 

## 2017-06-09 DIAGNOSIS — R1312 Dysphagia, oropharyngeal phase: Secondary | ICD-10-CM | POA: Diagnosis not present

## 2017-06-09 DIAGNOSIS — G20 Parkinson's disease: Secondary | ICD-10-CM | POA: Diagnosis not present

## 2017-06-21 ENCOUNTER — Ambulatory Visit (INDEPENDENT_AMBULATORY_CARE_PROVIDER_SITE_OTHER): Payer: Medicare Other | Admitting: Nurse Practitioner

## 2017-06-21 ENCOUNTER — Ambulatory Visit
Admission: RE | Admit: 2017-06-21 | Discharge: 2017-06-21 | Disposition: A | Discharge status: Home / Self Care | Payer: Medicare Other | Source: Ambulatory Visit | Attending: Nurse Practitioner | Admitting: Nurse Practitioner

## 2017-06-21 ENCOUNTER — Encounter: Payer: Self-pay | Admitting: Nurse Practitioner

## 2017-06-21 VITALS — BP 98/52 | HR 74 | Temp 98.4°F | Ht 73.0 in | Wt 161.0 lb

## 2017-06-21 DIAGNOSIS — G214 Vascular parkinsonism: Secondary | ICD-10-CM | POA: Diagnosis not present

## 2017-06-21 DIAGNOSIS — R1310 Dysphagia, unspecified: Secondary | ICD-10-CM

## 2017-06-21 DIAGNOSIS — R531 Weakness: Secondary | ICD-10-CM | POA: Diagnosis not present

## 2017-06-21 DIAGNOSIS — I959 Hypotension, unspecified: Secondary | ICD-10-CM

## 2017-06-21 DIAGNOSIS — R05 Cough: Secondary | ICD-10-CM | POA: Diagnosis not present

## 2017-06-21 LAB — BASIC METABOLIC PANEL WITH GFR
BUN/Creatinine Ratio: 21 (calc) (ref 6–22)
BUN: 32 mg/dL — ABNORMAL HIGH (ref 7–25)
CALCIUM: 9.6 mg/dL (ref 8.6–10.3)
CO2: 28 mmol/L (ref 20–32)
CREATININE: 1.53 mg/dL — AB (ref 0.70–1.11)
Chloride: 100 mmol/L (ref 98–110)
GFR, Est African American: 48 mL/min/{1.73_m2} — ABNORMAL LOW (ref >=60)
GFR, Est Non African American: 41 mL/min/{1.73_m2} — ABNORMAL LOW (ref >=60)
GLUCOSE: 153 mg/dL — AB (ref 65–139)
Potassium: 4 mmol/L (ref 3.5–5.3)
SODIUM: 138 mmol/L (ref 135–146)

## 2017-06-21 LAB — CBC WITH DIFFERENTIAL/PLATELET
BASOS PCT: 0.5 %
Basophils Absolute: 65 cells/uL (ref 0–200)
EOS PCT: 1.9 %
Eosinophils Absolute: 247 cells/uL (ref 15–500)
HCT: 40 % (ref 38.5–50.0)
HEMOGLOBIN: 13.7 g/dL (ref 13.2–17.1)
LYMPHS ABS: 1703 {cells}/uL (ref 850–3900)
MCH: 31.4 pg (ref 27.0–33.0)
MCHC: 34.3 g/dL (ref 32.0–36.0)
MCV: 91.5 fL (ref 80.0–100.0)
MONOS PCT: 8.4 %
MPV: 10.6 fL (ref 7.5–12.5)
Neutro Abs: 9893 cells/uL — ABNORMAL HIGH (ref 1500–7800)
Neutrophils Relative %: 76.1 %
Platelets: 261 10*3/uL (ref 140–400)
RBC: 4.37 10*6/uL (ref 4.20–5.80)
RDW: 12.6 % (ref 11.0–15.0)
Total Lymphocyte: 13.1 %
WBC mixed population: 1092 cells/uL — ABNORMAL HIGH (ref 200–950)
WBC: 13 10*3/uL — AB (ref 3.8–10.8)

## 2017-06-21 NOTE — Patient Instructions (Addendum)
STOP NORVASC (amlodipine)  due to low blood pressure.  Increase hydration.

## 2017-06-21 NOTE — Progress Notes (Signed)
Careteam: Patient Care Team: Gayland Curry, DO as PCP - General (Geriatric Medicine) Tat, Eustace Quail, DO as Consulting Physician (Neurology) Latanya Maudlin, MD as Consulting Physician (Orthopedic Surgery) Calvert Cantor, MD as Consulting Physician (Ophthalmology) Sable Feil, MD as Consulting Physician (Gastroenterology)  Advanced Directive information Does Patient Have a Medical Advance Directive?: Yes, Type of Advance Directive: [place];Living will  Allergies  Allergen Reactions  . Caffeine Swelling and Other (See Comments)    Reaction:  Joint swelling    Chief Complaint  Patient presents with  . Acute Visit    Pt is being seen due to suspected fevers at night for 3 days, pt's legs are very weak and he is unable to stand for long. pt has fallen several times in the last few weeks.      HPI: Patient is a 84 y.o. male seen in the office today due to multiple falls, increase weakness and feeling warm "burning up" per wife over the last 3 days.  Wife reports "over 100 based of feeling it all these years" Pt with a hx of multiple falls due to parkinson's syndrome.  Denies dysuria, abdominal pain, frequency.  Reports increase fatigue over the last few months, increase weakness over the last week. Pain in shoulder which has been chronic.  Reports pain in ears but this is due to hearing aids but going to audiologist for new hearing aids.  Pt with hx of aspiration with pneumonia and has chronic cough but reports more recently with congestion.  Has a suction machine but does not use it much because he is unable to cough anything up for the suction to get it.  Decrease in appetite and decrease in fluid intake.  Wife reports blood pressure was 74/59 yesterday and took several times with it still being low.   Review of Systems:  Review of Systems  Constitutional: Positive for fever (per wife ) and malaise/fatigue. Negative for chills.  HENT: Positive for  hearing loss.        Hearing aids   Eyes: Negative for blurred vision and double vision.       Glasses  Respiratory: Positive for cough. Negative for shortness of breath.   Cardiovascular: Negative for chest pain, palpitations and leg swelling.  Gastrointestinal: Positive for constipation. Negative for blood in stool and heartburn.  Genitourinary: Negative for dysuria, frequency, hematuria and urgency.       Incontinent of urine   Musculoskeletal: Positive for falls. Negative for joint pain.  Neurological: Positive for dizziness and weakness. Negative for headaches.       Reports some dizziness with being lightheaded   Psychiatric/Behavioral: Positive for memory loss.    Past Medical History:  Diagnosis Date  . Abnormality of gait   . Arthritis   . Bifascicular block   . Cyanocobalamin deficiency   . Depression   . Diverticulitis   . Dizzy   . Dysphagia, idiopathic    History of aspiration  . Gait disturbance   . Hearing loss of both ears   . Hyperlipidemia   . Hypertension   . Lumbar back pain   . Memory loss   . Neck pain   . Osteoarthritis   . Osteoporosis   . Other and unspecified hyperlipidemia   . Parkinsonism ([place])   . Recurrent falls    Using a cane and walker  . Restless leg   . Restless legs syndrome (RLS)   . Right knee pain   .  Shoulder pain   . Thoracic back pain   . Thyroid disease   . Type 2 diabetes mellitus with autonomic neuropathy ([place])   . Unspecified essential hypertension   . Unspecified hypothyroidism   . Vitamin D deficiency    Past Surgical History:  Procedure Laterality Date  . CATARACT EXTRACTION Bilateral 2012   Dr. Bing Plume   Social History:   reports that he quit smoking about 43 years ago. He has never used smokeless tobacco. He reports that he does not drink alcohol or use drugs.  Family History  Problem Relation Age of Onset  . Heart disease Mother   . Diabetes Mother   . Stroke Mother   . Cancer Father        prostate  .  Diabetes Sister   . Heart disease Brother     Medications: Patient's Medications  New Prescriptions   No medications on file  Previous Medications   ACETAMINOPHEN (TYLENOL) 500 MG TABLET    Take 1,000 mg by mouth every 6 (six) hours as needed for moderate pain or fever.   AMLODIPINE (NORVASC) 5 MG TABLET    Take 1 tablet (5 mg total) by mouth daily.   ASPIRIN EC 81 MG TABLET    Take 81 mg by mouth daily.   BENAZEPRIL (LOTENSIN) 5 MG TABLET    Take 1 tablet (5 mg total) by mouth daily.   BLOOD GLUCOSE MONITORING SUPPL (TRUE METRIX AIR GLUCOSE METER) W/DEVICE KIT    1 each by Does not apply route 3 (three) times daily. Use to test blood sugar three times daily. Dx: E11.43   CALCIUM CARBONATE-VITAMIN D (CALCIUM-CARB 600 + D) 600-125 MG-UNIT TABS    Take 1 tablet by mouth 2 (two) times daily.    CARBIDOPA-LEVODOPA (SINEMET IR) 25-100 MG TABLET    Take 3 tablets by mouth 2 (two) times daily.   CYANOCOBALAMIN (,VITAMIN B-12,) 1000 MCG/ML INJECTION    Inject 1 mL (1,000 mcg total) into the muscle every 30 (thirty) days. Pt receives on the 15th of every month.   DICLOFENAC SODIUM (VOLTAREN) 1 % GEL    Apply 4 g topically 4 (four) times daily. To affected knee   GABAPENTIN (NEURONTIN) 300 MG CAPSULE    Take 1 capsule (300 mg total) by mouth at bedtime.   GLUCOSE BLOOD TEST STRIP    True Metrix Test Strips Use to test blood sugar three times daily. Dx:E11.43   METFORMIN (GLUCOPHAGE) 1000 MG TABLET    Take one tablet by mouth twice daily with a meal   MIRTAZAPINE (REMERON) 30 MG TABLET    Take one tablet by mouth once daily at bedtime   MUPIROCIN OINTMENT (BACTROBAN) 2 %    APPLY  OINTMENT TOPICALLY AT BEDTIME TO  LESION  ON  SCROTUM   ONETOUCH DELICA LANCETS 81L MISC    Use to test blood sugar three times daily. Dx E11.43   PANTOPRAZOLE (PROTONIX) 40 MG TABLET    Take one tablet by mouth twice daily   PRAVASTATIN (PRAVACHOL) 20 MG TABLET    Take one tablet by mouth at bedtime   TAMSULOSIN (FLOMAX)  0.4 MG CAPS CAPSULE    Take 1 capsule (0.4 mg total) by mouth at bedtime.  Modified Medications   No medications on file  Discontinued Medications   No medications on file     Physical Exam:  Vitals:   06/21/17 1144  BP: (!) 98/52  Pulse: 74  Temp: 98.4 F (36.9 C)  TempSrc: Oral  SpO2: 98%  Weight: 161 lb (73 kg)  Height: _0  (1.854 m)   Body mass index is 21.24 kg/m.  Physical Exam  Constitutional: He is oriented to person, place, and time. He appears well-developed and well-nourished. No distress.  Frail elderly male in WC, NAD  HENT:  Head: Normocephalic and atraumatic.  Right Ear: External ear normal.  Left Ear: External ear normal.  Nose: Nose normal.  Mouth/Throat: Oropharynx is clear and moist. No oropharyngeal exudate.  Eyes: Pupils are equal, round, and reactive to light. Conjunctivae and EOM are normal.  Neck: Normal range of motion. Neck supple.  Cardiovascular: Normal rate, regular rhythm and normal heart sounds.  Pulmonary/Chest: Effort normal and breath sounds normal.  Abdominal: Soft. Bowel sounds are normal. He exhibits no distension. There is no tenderness. There is no guarding.  Musculoskeletal: He exhibits no edema or tenderness.  Lymphadenopathy:    He has no cervical adenopathy.  Neurological: He is alert and oriented to person, place, and time.  Skin: Skin is warm and dry. He is not diaphoretic.  Psychiatric: He has a normal mood and affect.    Labs reviewed: Basic Metabolic Panel: Recent Labs    10/20/16 0955  01/25/17 0535 01/26/17 0606 02/10/17 1623 04/29/17 1455  NA 141   < > 140 139 141 141  K 4.0   < > 2.9* 3.4* 4.7 4.3  CL 101   < > 108 108 103 101  CO2 24   < > _1 GLUCOSE 95   < > 108* 120* 164* 160*  BUN 24   < > 17 12 26* 24  CREATININE 1.13*   < > 0.90 0.93 1.20* 1.12*  CALCIUM 9.8   < > 8.1* 8.5* 10.0 9.8  MG  --   --  1.2*  --   --   --   TSH 2.70  --   --   --   --   --    < > = values in this interval  not displayed.   Liver Function Tests: Recent Labs    10/20/16 0955 12/08/16 1216 01/22/17 1720  AST 13 14 83*  ALT 13 6 34  ALKPHOS 78 90 84  BILITOT 0.6 0.4 1.3*  PROT 6.8 6.7 7.1  ALBUMIN 4.3 4.2 3.9   Recent Labs    01/22/17 1720  LIPASE 21   No results for input(s): AMMONIA in the last 8760 hours. CBC: Recent Labs    01/22/17 1720 01/25/17 0535 02/10/17 1623 04/09/17 04/29/17 1455  WBC 14.9* 5.1 9.8 10.0 8.5  NEUTROABS 13.2*  --  6,595  --  5,823  HGB 15.5 12.5* 14.3 15.2 15.4  HCT 45.3 37.8* 42.6 45 44.1  MCV 94.2 93.3 93.0  --  90.6  PLT 206 167 373 271 296   Lipid Panel: Recent Labs    10/20/16 0955  CHOL 154  HDL 51  LDLCALC 83  TRIG 99  CHOLHDL 3.0   TSH: Recent Labs    10/20/16 0955  TSH 2.70   A1C: Lab Results  Component Value Date   HGBA1C 5.2 10/20/2016     Assessment/Plan 1. Weakness -Without any obvious signs of infection but pt with high risk of aspiration and hx of UTI. Wife notes increase temperature in the evening however not confirmed with temperature, encouraged her to take oral temperature to confirm. Pt with decreased oral intake with weakness and hypotension today.  -to encourage increase in  fluids at this time.  - CBC with Differential/Platelets - BASIC METABOLIC PANEL WITH GFR - Urinalysis with Reflex Microscopic - Culture, Urine -chest xray  2. Vascular parkinsonism ([place]) -progressive worsening of gait with frequent falls, seems to progressively more weak over the last week. Will rule out infection as cause at this time.   3. Hypotension, unspecified hypotension type -to stop norvasc at this time, continue to check blood pressure and notify if sbp <100.   4. Dysphagia, unspecified type See number 1  - DG Chest 2 View  Next appt: 11/02/2017, sooner if needed  Janett Billow K. [place], [facility] [phone]

## 2017-06-22 ENCOUNTER — Other Ambulatory Visit: Payer: Self-pay | Admitting: Nurse Practitioner

## 2017-06-22 MED ORDER — CIPROFLOXACIN HCL 500 MG PO TABS: 500.0000 mg | ORAL_TABLET | Freq: Two times a day (BID) | ORAL | 0 refills | Status: DC

## 2017-06-28 LAB — URINALYSIS, ROUTINE W REFLEX MICROSCOPIC
Bilirubin Urine: NEGATIVE
Glucose, UA: NEGATIVE
HYALINE CAST: NONE SEEN /LPF
Hgb urine dipstick: NEGATIVE
NITRITE: NEGATIVE
SPECIFIC GRAVITY, URINE: 1.024 (ref 1.001–1.03)
WBC, UA: >=60 /HPF — AB (ref 0–5)
pH: <=5 (ref 5.0–8.0)

## 2017-06-28 LAB — URINE CULTURE
MICRO NUMBER: 90520361
SPECIMEN QUALITY:: ADEQUATE

## 2017-07-01 ENCOUNTER — Telehealth: Payer: Self-pay | Admitting: *Deleted

## 2017-07-01 NOTE — Telephone Encounter (Signed)
Wife dropped off patient's Handicap Placard and asked if it could be signed. Placed in Dr Ernest Mallick folder to review and sign. Placed note to return to [place]. To be mailed to patient once completed.

## 2017-07-08 NOTE — Progress Notes (Signed)
Nicholas Barnett was seen today in the movement disorders clinic for neurologic consultation at the request of Reed, Tiffany L, DO.  The consultation is for the evaluation of Parkinsonism.  The patient has previously seen Dr. Erling Cruz and Dr. Leta Baptist.  Records were reviewed from both of those physicians.  The patient's symptoms started in 2010.  First symptoms consisted of dizziness and falls.  He began to see Dr. Erling Cruz around the time that symptoms began.  He first had an MRI of the brain in February 2011 that demonstrated rather significant atrophy.  B12 at that time was found to be low at 181.  He had a swallow study in June, 2011 that demonstrated moderate dysphagia with intermittent aspiration of thin liquids.  He was tried on levodopa (I do not know what dose and they don't even remember the medication or how long he was on it) and that did not seem to help.  He was later tried on ropinirole which also did not help.  He was tried on, and is still on, gabapentin for restless leg syndrome.  His most recent MRI of the brain was on 11/21/2013 and I reviewed this as well as his previous MRI of the brain.  There remains very significant atrophy, although there is not significant basal ganglia disease.  There is mild to moderate small vessel disease.  02/13/14 update:  Pt was seen today for UPDRS motor on/off testing.  He is accompanied by his family who supplements the hx.  Pt had MBE on 02/05/14 and demonstrated mild oral and mod pharyngeal phase dysphagia and mechanical soft with honey thick liquid was recommended.  He is still awaiting ST to contact him (out pt).  05/15/14 update:  The patient is accompanied by his family supplements the history.  Patient has a history of multiple system atrophy.  He has been minimally responsive to levodopa via UPDRS on/off last visit but they wanted to retry the medication at last visit and he was told to take 2 tablets at 11 AM (wakeup time) 2 at 3 PM and 1 at 7 PM.  Pt  states that last visit he walked in here with a walker and left without it after the on/off test.  Has felt much better ever since.  He was doing well in PT but pt states that the last time he went he got put on a machine that hurt his shoulder.  His wife does state that the levodopa has helped him remarkably.  He is now able to do much of his own dressing and bathing and he was not able to do that previously.  No falls since our last visit.  Pt states that he could not deal with the thickened food and understands the risks of not following the recommended diet.  He is, however, eating better and has a much better appetite.  He has gained some weight.  He did not get speech therapy; wife states that they were to call back to set this up but didn't.    09/18/14 update:  The patient presents today, accompanied by his wife who supplements the history.  The patients records were reviewed since last visit.  Despite the fact that the patient's UPDRS motor on/off test did not show much benefit with levodopa, the patient and his wife think that the levodopa has been very beneficial at home and helped freezing.  Therefore, he has been on carbidopa/levodopa 25/100, 2 tablets in the morning, 2 in the afternoon  and one towards evening.  He doesn't think that's its working as well but his wife thinks that hes not taking it as prescribed.  He was admitted to the hospital from June 4 to June 6 with atypical chest pain.  While he was in the hospital he had a modified barium swallow study done on 07/30/2014.  This recommended a continued dysphasia 3 diet, this time with thin liquids with strict precautions.  There was evidence of moderate pharyngeal phase dysphagia.  Pt admits that he isn't following the diet.  Pt states that about 3 weeks ago he fell backward and hit his head on a patio table.  Wife states that it didn't hit hard and no LOC.  No other falls.  He did have an injection in both shoulders recently and told that he  would be candidate for shoulder replacement.  His shoulders are still hurting him.  07/02/15 update:  The patient is following up today, accompanied by his wife who supplements the history.  I have not seen the patient since July, 2016.  At that point in time, his levodopa was increased from a total of 500 mg per day to 700 mg per day.  He was supposed to be on the carbidopa/levodopa 25/100, 3 tablets in the morning, 3 in the afternoon and one in the evening.  The patient states that he now takes 4 in the AM and doesn't take any after that as he thinks that it causes headache.  He admits that he d/c it for a while and had no headache.  He doesn't think that the medication helps walking but it did help his "trembling."  He admits to falls; one time got off golf cart and walking into store and fell in doorway.  Another time, fell out near golf cart and wife didn't find him for 1.5 hours.  Had a fall out of bed while sleeping.  Wife states that he is usually very still in the bed.  No yelling out in the night.  His wife does sleep in the same bed with him.  His wife called me a few weeks after I increased the dose to state that it was helpful.  He has refused physical therapy, primarily because of pain elsewhere.  His wife recently called to make a follow-up appointment stating that his anxiety and depression have increased.  He admits to being depressed.  His wife admits to being frustrated over the fact that he does not move.  10/03/15 update:  The patient follows up today, accompanied by his wife who supplements the history.  I have reviewed records available to me from other providers as well.  Last visit, the patient was only taking levodopa in the morning and he did not think it was helpful, so I told him he could discontinue it.  I also referred him to physical therapy, but he only went one time and thought it was for a wheelchair evaluation, and refused to go back.  His wife called me after the discontinuation  of levodopa and stated that he had gotten worse.  I said that he could go back on it, but the patient refused but then ended up going back on 3 po bid.  She had also noticed personality changes after he went off of it, but he refused psychiatric therapy.  She did not think that the personality changes were related to the addition of Remeron because the timing did not seem to correlate with that.  Ran out  of that 2 days ago.  Wife not sure that "it was strong enough."  Asks about whether or not dose should be increased.  Comes in with crutches today and states that he can walk with those but not with walker or cane.  No falls since our last visit.  Denies swallowing issues unless he tries to take all of his pills at once, including his large calcium pill.  02/25/16 update:  Pt f/u today, accompanied by his wife who supplements the history.  Still on carbidopa/levodopa 25/100, 3 po bid. He doesn't think it works but wife does.  Last visit, remeron increased to 30 mg daily.  Pt states that sleep is much better.  The records that were made available to me were reviewed since last visit.  Went to ER at end of November after going to UC with c/o malaise and fatigue.  UC stated that BP was low and he had new RBBB.  ER workup unremarkable and d/c home and f/u with NP two days later.  Pt states that he has been stable and pt states that he has been "stable lousy."  He is not exercising.  Wife states that he is supposed to be starting in home PT this month.  States that he tried forearm crutches and they made his bursitis worse than with the crutches.  Had one fall out of the bed.  Wife states that she was gone and she tried to get him up before she left but he refused; he then tried to get himself out of the bed and ended up falling and had to wait for family to come help him up.  He asks about a RX for a hospital bed.  He is in the bed 14 hours per day.  He cannot maneuver himself out of the bed and takes 2 urinals at night  to relieve himself while in the bed.  He still has to have a pad in the bed.  States that it just leaks and he cannot feel it.  Wife changes pad daily.    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  I have not seen him in a year and a half.  I have reviewed medical records made available to me.  This patient is accompanied in the office by his spouse who supplements the history.He has been seeing his primary care physician as well as a Designer, jewellery.  He continues to take carbidopa/levodopa 25/100, 3 tablets twice per day, despite the many discussions that this does not work on twice per day dosing.  In addition, he has vascular parkinsonism, and we have discussed in the past that levodopa may not be beneficial for this.  Wife states that pt doesn't walk any more except to transfer.  They describe start hesitation.  Did PT from December to feb.  Able to walk better then per wife but pt denies that.   In hospital bed but no longer able to use the trapeze and wife has to pull him up.  Rides scooter within the house.  Primary care notes indicate that the patient has had recurrent aspiration.  Patient has had multiple falls since our last visit.  He presented to the nurse practitioner June 21, 2017 with complaints of falls and weakness.  Klebsiella UTI was identified.  He was subsequently treated with Cipro.  He also had significant worsening of his renal function at that time.    Neuroimaging has previously been performed.  It is available  for my review.  I reviewed pts MRI brain from 11/21/13.  There was no evidence of BG disease but there was very significant atrophy.    PREVIOUS MEDICATIONS: Sinemet and Requip  ALLERGIES:   Allergies  Allergen Reactions  . Caffeine Swelling and Other (See Comments)    Reaction:  Joint swelling    CURRENT MEDICATIONS:  Outpatient Encounter Medications as of 07/09/2017  Medication Sig  . aspirin EC 81 MG tablet Take 81 mg by mouth daily.  .  benazepril (LOTENSIN) 5 MG tablet Take 1 tablet (5 mg total) by mouth daily.  . Blood Glucose Monitoring Suppl (TRUE METRIX AIR GLUCOSE METER) w/Device KIT 1 each by Does not apply route 3 (three) times daily. Use to test blood sugar three times daily. Dx: E11.43  . Calcium Carbonate-Vitamin D (CALCIUM-CARB 600 + D) 600-125 MG-UNIT TABS Take 1 tablet by mouth 2 (two) times daily.   . carbidopa-levodopa (SINEMET IR) 25-100 MG tablet Take 3 tablets by mouth 2 (two) times daily.  . cyanocobalamin (,VITAMIN B-12,) 1000 MCG/ML injection Inject 1 mL (1,000 mcg total) into the muscle every 30 (thirty) days. Pt receives on the 15th of every month.  . diclofenac sodium (VOLTAREN) 1 % GEL Apply 4 g topically 4 (four) times daily. To affected knee  . gabapentin (NEURONTIN) 300 MG capsule Take 1 capsule (300 mg total) by mouth at bedtime.  Marland Kitchen glucose blood test strip True Metrix Test Strips Use to test blood sugar three times daily. Dx:E11.43  . metFORMIN (GLUCOPHAGE) 1000 MG tablet Take one tablet by mouth twice daily with a meal  . mirtazapine (REMERON) 30 MG tablet Take one tablet by mouth once daily at bedtime  . mupirocin ointment (BACTROBAN) 2 % APPLY  OINTMENT TOPICALLY AT BEDTIME TO  LESION  ON  SCROTUM  . ONETOUCH DELICA LANCETS 40G MISC Use to test blood sugar three times daily. Dx E11.43  . pantoprazole (PROTONIX) 40 MG tablet Take one tablet by mouth twice daily  . pravastatin (PRAVACHOL) 20 MG tablet Take one tablet by mouth at bedtime  . tamsulosin (FLOMAX) 0.4 MG CAPS capsule Take 1 capsule (0.4 mg total) by mouth at bedtime.  . [DISCONTINUED] acetaminophen (TYLENOL) 500 MG tablet Take 1,000 mg by mouth every 6 (six) hours as needed for moderate pain or fever.  . [DISCONTINUED] amLODipine (NORVASC) 5 MG tablet Take 1 tablet (5 mg total) by mouth daily.  . [DISCONTINUED] ciprofloxacin (CIPRO) 500 MG tablet Take 1 tablet (500 mg total) by mouth 2 (two) times daily.   No facility-administered  encounter medications on file as of 07/09/2017.     PAST MEDICAL HISTORY:   Past Medical History:  Diagnosis Date  . Abnormality of gait   . Arthritis   . Bifascicular block   . Cyanocobalamin deficiency   . Depression   . Diverticulitis   . Dizzy   . Dysphagia, idiopathic    History of aspiration  . Gait disturbance   . Hearing loss of both ears   . Hyperlipidemia   . Hypertension   . Lumbar back pain   . Memory loss   . Neck pain   . Osteoarthritis   . Osteoporosis   . Other and unspecified hyperlipidemia   . Parkinsonism ([place])   . Recurrent falls    Using a cane and walker  . Restless leg   . Restless legs syndrome (RLS)   . Right knee pain   . Shoulder pain   . Thoracic back  pain   . Thyroid disease   . Type 2 diabetes mellitus with autonomic neuropathy ([place])   . Unspecified essential hypertension   . Unspecified hypothyroidism   . Vitamin D deficiency     PAST SURGICAL HISTORY:   Past Surgical History:  Procedure Laterality Date  . CATARACT EXTRACTION Bilateral 2012   Dr. Bing Plume    SOCIAL HISTORY:   Social History   Socioeconomic History  . Marital status: Married    Spouse name: Inez Catalina  . Number of children: 3  . Years of education: College  . Highest education level: Not on file  Occupational History  . Occupation: Retired    Fish farm manager: RETIRED    Comment: Manager/ [employer]  . Financial resource strain: Not hard at all  . Food insecurity:    Worry: Never true    Inability: Never true  . Transportation needs:    Medical: No    Non-medical: No  Tobacco Use  . Smoking status: Former Smoker    Last attempt to quit: 01/31/1974    Years since quitting: 43.4  . Smokeless tobacco: Never Used  Substance and Sexual Activity  . Alcohol use: No    Alcohol/week: 0.0 oz  . Drug use: No  . Sexual activity: Never  Lifestyle  . Physical activity:    Days per week: 0 days    Minutes per session: 0 min  . Stress: To some extent    Relationships  . Social connections:    Talks on phone: More than three times a week    Gets together: More than three times a week    Attends religious service: Never    Active member of club or organization: No    Attends meetings of clubs or organizations: Never    Relationship status: Married  . Intimate partner violence:    Fear of current or ex partner: No    Emotionally abused: No    Physically abused: No    Forced sexual activity: No  Other Topics Concern  . Not on file  Social History Narrative   Married 1956. 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   Patient lives at home with his family.   Caffeine Use: none   Patient has a living will and healthcare power of attorney.    FAMILY HISTORY:   Family Status  Relation Name Status  . Mother  Deceased at age 71       stroke, MI  . Father  Deceased at age 16       prostate cancer  . Sister  Deceased at age 22       diabetic, cirrhosis  . Brother  Deceased at age 8 months       crib death  . Brother  Deceased at age 10       MI    ROS:  A complete 10 system review of systems was obtained and was unremarkable apart from what is mentioned above.  PHYSICAL EXAMINATION:    VITALS:   Vitals:   07/09/17 0924  BP: 116/80  Pulse: 82  SpO2: 90%   Wt Readings from Last 3 Encounters:  06/21/17 161 lb (73 kg)  05/20/17 163 lb (73.9 kg)  05/20/17 163 lb (73.9 kg)     GEN:  The patient appears stated age and is in NAD. HEENT:  Normocephalic, atraumatic.  The mucous membranes are moist. The superficial temporal arteries are without ropiness or tenderness. CV:  RRR Lungs:  CTAB Neck/HEME:  There are no carotid bruits bilaterally.  Neurological examination:  Orientation: The patient is alert and oriented x3.  He is slow to answer but is able.   Cranial nerves: There is good facial symmetry. There is facial hypomimia.  The visual fields are full to confrontational testing. The speech is fluent and clear but quite  hypophonic. Soft palate rises symmetrically and there is no tongue deviation. Hearing is intact to conversational tone. Sensation: Sensation is intact to light touch throughout.   Movement examination: Tone: There is normal tone in the bilateral UE/LE Abnormal movements: none Coordination:  There is slow RAMs but not necessarily decremation. Gait and Station: The patient was given a gait belt and a walker.  With assist he arises fairly easily.  He is slow and slightly drags the right leg but he is able to ambulate on own with someone standing next to him.  He has a flexed posture.    Labs:   Chemistry      Component Value Date/Time   NA 138 06/21/2017 1224   NA 142 12/08/2016 1216   K 4.0 06/21/2017 1224   CL 100 06/21/2017 1224   CO2 28 06/21/2017 1224   BUN 32 (H) 06/21/2017 1224   BUN 28 (H) 12/08/2016 1216   CREATININE 1.53 (H) 06/21/2017 1224      Component Value Date/Time   CALCIUM 9.6 06/21/2017 1224   ALKPHOS 84 01/22/2017 1720   AST 83 (H) 01/22/2017 1720   ALT 34 01/22/2017 1720   BILITOT 1.3 (H) 01/22/2017 1720   BILITOT 0.4 12/08/2016 1216     Lab Results  Component Value Date   WBC 13.0 (H) 06/21/2017   HGB 13.7 06/21/2017   HCT 40.0 06/21/2017   MCV 91.5 06/21/2017   PLT 261 06/21/2017     ASSESSMENT/PLAN:  1.  Vascular parkinsonism  -Minimal response to levodopa on our UPDRS motor on/off test, but patient and his wife initially reported marked response to levodopa, even though I did not see that in the office.  When he stopped it, his wife thought he got much worse and she made him go back on it but he will only take it bid and he takes 3 po bid.   Have discussed many times that levodopa really does not work as a twice daily medication.  Reiterated this today.  He does not wake up until 11 AM.  Can take 2 tablets at 11 AM/2 tablets at 3 PM/2 tablets at 7 PM.  -I think that part of the issue we are seeing is nearly lack of motivation.  They told me he could  not walk, but he was given a gait belt and walked down the hall fairly well.  Talked with the patient about the fact that his wife is not going to be able to lift him for long without hurting herself, which will ultimately land him in a nursing home if his caregiver cannot help him.  He was insistent that he does better with crutches, but I have seen him with crutches and that is just not the case.  Explained that he is a fall risk and crutches would not be appropriate.  He reports that he has been told this by his other providers as well, but he still thinks crutches are better.  -Do think that worsening of symptoms is really multifactorial and not just related to vascular parkinsonism.  I think this is related to recent urinary tract infection along with  renal insufficiency.  We will recheck those labs today.  Will add cbc as had leukocytosis then too, likely related to his UTI but want to make sure that has resolved  -Explained to patient and his wife that there really is nothing more to do, short of exercise and physical therapy.  He was done with physical therapy in February.  This would be too soon to restart it again. 2.  Dysphagia.  -he had a modified barium swallow study done on 07/30/2014.  This recommended a continued dysphasia 3 diet, this time with thin liquids with strict precautions.  He is not doing that and understands the risks of morbidity and mortality.  He denies that he has this issue 3.  Anxiety, depression and some difficulty getting to sleep  -continue remeron 30 mg.  Pt/wife both think that it has been helpful.  Still on this and will continue 4.  renal insufficiency  -recheck labs, as above.  Hydration discussed 5.  Follow-up as needed.  Much greater than 50% of this visit was spent in counseling and coordinating care.  Total face to face time:  25 min

## 2017-07-09 ENCOUNTER — Encounter: Payer: Self-pay | Admitting: Neurology

## 2017-07-09 ENCOUNTER — Ambulatory Visit: Payer: Medicare Other | Admitting: Neurology

## 2017-07-09 ENCOUNTER — Other Ambulatory Visit: Payer: Medicare Other

## 2017-07-09 VITALS — BP 116/80 | HR 82

## 2017-07-09 DIAGNOSIS — G20 Parkinson's disease: Secondary | ICD-10-CM | POA: Diagnosis not present

## 2017-07-09 DIAGNOSIS — D72829 Elevated white blood cell count, unspecified: Secondary | ICD-10-CM

## 2017-07-09 DIAGNOSIS — R1312 Dysphagia, oropharyngeal phase: Secondary | ICD-10-CM | POA: Diagnosis not present

## 2017-07-09 DIAGNOSIS — N289 Disorder of kidney and ureter, unspecified: Secondary | ICD-10-CM | POA: Diagnosis not present

## 2017-07-09 DIAGNOSIS — G214 Vascular parkinsonism: Secondary | ICD-10-CM

## 2017-07-09 LAB — CBC WITH DIFFERENTIAL/PLATELET
BASOS ABS: 77 {cells}/uL (ref 0–200)
Basophils Relative: 1.1 %
Eosinophils Absolute: 280 cells/uL (ref 15–500)
Eosinophils Relative: 4 %
HCT: 42.3 % (ref 38.5–50.0)
Hemoglobin: 14.3 g/dL (ref 13.2–17.1)
Lymphs Abs: 2338 cells/uL (ref 850–3900)
MCH: 31.1 pg (ref 27.0–33.0)
MCHC: 33.8 g/dL (ref 32.0–36.0)
MCV: 92 fL (ref 80.0–100.0)
MONOS PCT: 7.2 %
MPV: 10.5 fL (ref 7.5–12.5)
NEUTROS PCT: 54.3 %
Neutro Abs: 3801 cells/uL (ref 1500–7800)
Platelets: 294 10*3/uL (ref 140–400)
RBC: 4.6 10*6/uL (ref 4.20–5.80)
RDW: 12.7 % (ref 11.0–15.0)
TOTAL LYMPHOCYTE: 33.4 %
WBC: 7 10*3/uL (ref 3.8–10.8)
WBCMIX: 504 {cells}/uL (ref 200–950)

## 2017-07-09 LAB — COMPREHENSIVE METABOLIC PANEL
AG Ratio: 1.6 (calc) (ref 1.0–2.5)
ALKALINE PHOSPHATASE (APISO): 67 U/L (ref 40–115)
ALT: 4 U/L — AB (ref 9–46)
AST: 13 U/L (ref 10–35)
Albumin: 3.9 g/dL (ref 3.6–5.1)
BUN: 20 mg/dL (ref 7–25)
CHLORIDE: 103 mmol/L (ref 98–110)
CO2: 29 mmol/L (ref 20–32)
CREATININE: 1.08 mg/dL (ref 0.70–1.11)
Calcium: 9.9 mg/dL (ref 8.6–10.3)
GLUCOSE: 114 mg/dL — AB (ref 65–99)
Globulin: 2.5 g/dL (calc) (ref 1.9–3.7)
Potassium: 4.2 mmol/L (ref 3.5–5.3)
Sodium: 142 mmol/L (ref 135–146)
Total Bilirubin: 0.7 mg/dL (ref 0.2–1.2)
Total Protein: 6.4 g/dL (ref 6.1–8.1)

## 2017-07-09 NOTE — Patient Instructions (Addendum)
1. Your provider has requested that you have labwork completed today. Please go to [facility] ([id number]) on the second floor of this building before leaving the office today. You do not need to check in. If you are not called within 15 minutes please check with the front desk.   2. We recommend you take Carbidopa Levodopa 2 tablets three times daily at 11 am, 3 pm, and 7 pm.

## 2017-07-12 ENCOUNTER — Telehealth: Payer: Self-pay | Admitting: Neurology

## 2017-07-12 NOTE — Telephone Encounter (Signed)
Patient's wife made aware of results

## 2017-07-12 NOTE — Telephone Encounter (Signed)
-----   Message from Octaviano Batty Tat, DO sent at 07/12/2017  7:22 AM EDT ----- Let pt/wife know that labs okay and kidney function much improved

## 2017-07-16 ENCOUNTER — Other Ambulatory Visit: Payer: Self-pay | Admitting: Internal Medicine

## 2017-08-04 ENCOUNTER — Ambulatory Visit (INDEPENDENT_AMBULATORY_CARE_PROVIDER_SITE_OTHER): Payer: Medicare Other | Admitting: Nurse Practitioner

## 2017-08-04 ENCOUNTER — Encounter: Payer: Self-pay | Admitting: Nurse Practitioner

## 2017-08-04 VITALS — BP 132/84 | HR 89 | Temp 98.3°F | Ht 73.0 in | Wt 164.0 lb

## 2017-08-04 DIAGNOSIS — G214 Vascular parkinsonism: Secondary | ICD-10-CM

## 2017-08-04 NOTE — Progress Notes (Signed)
Careteam: Patient Care Team: Gayland Curry, DO as PCP - General (Geriatric Medicine) Tat, Eustace Quail, DO as Consulting Physician (Neurology) Latanya Maudlin, MD as Consulting Physician (Orthopedic Surgery) Calvert Cantor, MD as Consulting Physician (Ophthalmology) Sable Feil, MD as Consulting Physician (Gastroenterology)  Advanced Directive information Does Patient Have a Medical Advance Directive?: Yes, Type of Advance Directive: [place];Living will  Allergies  Allergen Reactions  . Caffeine Swelling and Other (See Comments)    Reaction:  Joint swelling    Chief Complaint  Patient presents with  . Acute Visit    Pt is being seen to be elevaluated for home health.      HPI: Patient is a 84 y.o. male seen in the office today for home health care. It is taking a toll on the wife to get him out of bed and help with his ADLS due to his progressive disease. Needs someone to help get him out of bed. She has a hard time with this with increase in her back pain.  Wife reports they do NOT need home health. Has had PT in the past and this is not what they need. They would like a part time in home caregiver to help with care.   Review of Systems:  Review of Systems  Constitutional: Negative for chills, fever and malaise/fatigue.  HENT: Positive for hearing loss.        Hearing aids   Eyes: Negative for blurred vision and double vision.       Glasses  Respiratory: Negative for shortness of breath.   Cardiovascular: Negative for chest pain, palpitations and leg swelling.  Gastrointestinal: Positive for constipation. Negative for blood in stool and heartburn.  Genitourinary: Positive for frequency and urgency. Negative for hematuria.       Wear depend brief with a guard inside it   Musculoskeletal: Positive for falls, joint pain and neck pain.       Shoulder and knee (right)   Neurological: Positive for weakness. Negative for dizziness and headaches.     Numbness in feet   Psychiatric/Behavioral: Positive for memory loss. Negative for depression. The patient is nervous/anxious. The patient does not have insomnia.     Past Medical History:  Diagnosis Date  . Abnormality of gait   . Arthritis   . Bifascicular block   . Cyanocobalamin deficiency   . Depression   . Diverticulitis   . Dizzy   . Dysphagia, idiopathic    History of aspiration  . Gait disturbance   . Hearing loss of both ears   . Hyperlipidemia   . Hypertension   . Lumbar back pain   . Memory loss   . Neck pain   . Osteoarthritis   . Osteoporosis   . Other and unspecified hyperlipidemia   . Parkinsonism ([place])   . Recurrent falls    Using a cane and walker  . Restless leg   . Restless legs syndrome (RLS)   . Right knee pain   . Shoulder pain   . Thoracic back pain   . Thyroid disease   . Type 2 diabetes mellitus with autonomic neuropathy ([place])   . Unspecified essential hypertension   . Unspecified hypothyroidism   . Vitamin D deficiency    Past Surgical History:  Procedure Laterality Date  . CATARACT EXTRACTION Bilateral 2012   Dr. Bing Plume   Social History:   reports that he quit smoking about 43 years ago. He has never used smokeless  tobacco. He reports that he does not drink alcohol or use drugs.  Family History  Problem Relation Age of Onset  . Heart disease Mother   . Diabetes Mother   . Stroke Mother   . Cancer Father        prostate  . Diabetes Sister   . Heart disease Brother     Medications: Patient's Medications  New Prescriptions   No medications on file  Previous Medications   ASPIRIN EC 81 MG TABLET    Take 81 mg by mouth daily.   BENAZEPRIL (LOTENSIN) 5 MG TABLET    Take 1 tablet (5 mg total) by mouth daily.   BLOOD GLUCOSE MONITORING SUPPL (TRUE METRIX AIR GLUCOSE METER) W/DEVICE KIT    1 each by Does not apply route 3 (three) times daily. Use to test blood sugar three times daily. Dx: E11.43   CALCIUM CARBONATE-VITAMIN D  (CALCIUM-CARB 600 + D) 600-125 MG-UNIT TABS    Take 1 tablet by mouth 2 (two) times daily.    CARBIDOPA-LEVODOPA (SINEMET IR) 25-100 MG TABLET    Take 3 tablets by mouth 2 (two) times daily.   CYANOCOBALAMIN (,VITAMIN B-12,) 1000 MCG/ML INJECTION    Inject 1 mL (1,000 mcg total) into the muscle every 30 (thirty) days. Pt receives on the 15th of every month.   DICLOFENAC SODIUM (VOLTAREN) 1 % GEL    Apply 4 g topically 4 (four) times daily. To affected knee   GABAPENTIN (NEURONTIN) 300 MG CAPSULE    Take 1 capsule (300 mg total) by mouth at bedtime.   GLUCOSE BLOOD TEST STRIP    True Metrix Test Strips Use to test blood sugar three times daily. Dx:E11.43   METFORMIN (GLUCOPHAGE) 1000 MG TABLET    TAKE 1 TABLET BY MOUTH TWO  TIMES DAILY WITH MEALS   MIRTAZAPINE (REMERON) 30 MG TABLET    TAKE 1 TABLET BY MOUTH AT  BEDTIME   MUPIROCIN OINTMENT (BACTROBAN) 2 %    APPLY  OINTMENT TOPICALLY AT BEDTIME TO  LESION  ON  SCROTUM   ONETOUCH DELICA LANCETS 40J MISC    Use to test blood sugar three times daily. Dx E11.43   PANTOPRAZOLE (PROTONIX) 40 MG TABLET    TAKE 1 TABLET BY MOUTH TWO  TIMES DAILY   PRAVASTATIN (PRAVACHOL) 20 MG TABLET    TAKE 1 TABLET BY MOUTH AT  BEDTIME   TAMSULOSIN (FLOMAX) 0.4 MG CAPS CAPSULE    Take 1 capsule (0.4 mg total) by mouth at bedtime.  Modified Medications   No medications on file  Discontinued Medications   No medications on file     Physical Exam:  Vitals:   08/04/17 1516  BP: 132/84  Pulse: 89  Temp: 98.3 F (36.8 C)  TempSrc: Oral  SpO2: 95%  Weight: 164 lb (74.4 kg)  Height: '6\' 1"'  (1.854 m)   Body mass index is 21.64 kg/m.  Physical Exam  Constitutional: He appears well-developed. No distress.  Frail elderly male in WC, NAD  HENT:  Head: Normocephalic and atraumatic.  Nose: Nose normal.  Mouth/Throat: No oropharyngeal exudate.  Eyes: Pupils are equal, round, and reactive to light. Conjunctivae and EOM are normal.  Neck: Normal range of motion.  Neck supple.  Cardiovascular: Normal rate, regular rhythm and normal heart sounds.  Pulmonary/Chest: Effort normal and breath sounds normal.  Musculoskeletal: He exhibits no edema or tenderness.  Neurological: He is alert.  Significant weakness due to parkinson   Skin: Skin is warm  and dry. He is not diaphoretic.  Psychiatric: He has a normal mood and affect.    Labs reviewed: Basic Metabolic Panel: Recent Labs    10/20/16 0955  01/25/17 0535  04/29/17 1455 06/21/17 1224 07/09/17 1008  NA 141   < > 140   < > 141 138 142  K 4.0   < > 2.9*   < > 4.3 4.0 4.2  CL 101   < > 108   < > 101 100 103  CO2 24   < > 24   < > '29 28 29  ' GLUCOSE 95   < > 108*   < > 160* 153* 114*  BUN 24   < > 17   < > 24 32* 20  CREATININE 1.13*   < > 0.90   < > 1.12* 1.53* 1.08  CALCIUM 9.8   < > 8.1*   < > 9.8 9.6 9.9  MG  --   --  1.2*  --   --   --   --   TSH 2.70  --   --   --   --   --   --    < > = values in this interval not displayed.   Liver Function Tests: Recent Labs    10/20/16 0955 12/08/16 1216 01/22/17 1720 07/09/17 1008  AST 13 14 83* 13  ALT 13 6 34 4*  ALKPHOS 78 90 84  --   BILITOT 0.6 0.4 1.3* 0.7  PROT 6.8 6.7 7.1 6.4  ALBUMIN 4.3 4.2 3.9  --    Recent Labs    01/22/17 1720  LIPASE 21   No results for input(s): AMMONIA in the last 8760 hours. CBC: Recent Labs    04/29/17 1455 06/21/17 1224 07/09/17 1008  WBC 8.5 13.0* 7.0  NEUTROABS 5,823 9,893* 3,801  HGB 15.4 13.7 14.3  HCT 44.1 40.0 42.3  MCV 90.6 91.5 92.0  PLT 296 261 294   Lipid Panel: Recent Labs    10/20/16 0955  CHOL 154  HDL 51  LDLCALC 83  TRIG 99  CHOLHDL 3.0   TSH: Recent Labs    10/20/16 0955  TSH 2.70   A1C: Lab Results  Component Value Date   HGBA1C 5.2 10/20/2016     Assessment/Plan 1. 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 She is needing increase assistance on a regular basis, was told insurance  offered program for this but needed the "okay" by primary care. Does not have paperwork today but will bring back to office if needed - Ambulatory referral to [facility] to also help with resources.   Next appt: 11/02/2017, sooner if needed Carlos American. [place], [facility] [phone]

## 2017-08-04 NOTE — Patient Instructions (Signed)
Let us know about any paperwork to help assist with increase in care in the home.  Referral has been placed on our end and they will call you to see about assisting you with resources

## 2017-08-09 DIAGNOSIS — R1312 Dysphagia, oropharyngeal phase: Secondary | ICD-10-CM | POA: Diagnosis not present

## 2017-08-09 DIAGNOSIS — G20 Parkinson's disease: Secondary | ICD-10-CM | POA: Diagnosis not present

## 2017-08-10 DIAGNOSIS — G20 Parkinson's disease: Secondary | ICD-10-CM | POA: Diagnosis not present

## 2017-08-10 DIAGNOSIS — G214 Vascular parkinsonism: Secondary | ICD-10-CM | POA: Diagnosis not present

## 2017-08-10 DIAGNOSIS — G232 Striatonigral degeneration: Secondary | ICD-10-CM | POA: Diagnosis not present

## 2017-08-10 DIAGNOSIS — R1312 Dysphagia, oropharyngeal phase: Secondary | ICD-10-CM | POA: Diagnosis not present

## 2017-08-10 DIAGNOSIS — J9601 Acute respiratory failure with hypoxia: Secondary | ICD-10-CM | POA: Diagnosis not present

## 2017-08-16 ENCOUNTER — Telehealth: Payer: Self-pay | Admitting: *Deleted

## 2017-08-16 NOTE — Telephone Encounter (Signed)
Patient wife, [name] called and was wondering about the [facility] Referral that was placed at appointment on 08/04/17. Needing help with Husband.  Message Forwarded to DeeDee.

## 2017-08-17 NOTE — Telephone Encounter (Signed)
Okay to fax over order for this.

## 2017-08-17 NOTE — Telephone Encounter (Signed)
I spoke with the patient and gave her the information for [facility] [phone].  However, she stated that the insurance company told her to have the doctor write an order and fax it to them.  They would then assign her an agency that will come out to her home.

## 2017-08-17 NOTE — Telephone Encounter (Signed)
I think she is getting confused with home health vs in home care. I am not aware of any order for in home care.

## 2017-08-17 NOTE — Telephone Encounter (Signed)
I just spoke with her again and explained the difference between home health (clinical) and in-home care (non-clinical, personal care).  She said that she spoke to the insurance company yesterday, and they told her they would cover 3-6 hours of personal care services to assist with bathing and dressing if a prescription is faxed to them stating that's what's needed.  I asked her for the fax number, but she couldn't find it.  She says that Syn[facility] Failnita should have it.

## 2017-08-19 MED ORDER — AMBULATORY NON FORMULARY MEDICATION: 0 refills | Status: DC

## 2017-08-19 NOTE — Telephone Encounter (Signed)
[facility] no longer has patient in their services.  I called wife to see who I needed to fax the Rx to and she stated that the insurance company [facility] stated to fax it to them and then they would choice the company to send out. [facility].

## 2017-08-19 NOTE — Telephone Encounter (Signed)
Rx printed and faxed to [facility] Fax: [phone]

## 2017-09-08 DIAGNOSIS — G20 Parkinson's disease: Secondary | ICD-10-CM | POA: Diagnosis not present

## 2017-09-08 DIAGNOSIS — R1312 Dysphagia, oropharyngeal phase: Secondary | ICD-10-CM | POA: Diagnosis not present

## 2017-09-30 DIAGNOSIS — H524 Presbyopia: Secondary | ICD-10-CM | POA: Diagnosis not present

## 2017-09-30 DIAGNOSIS — Z961 Presence of intraocular lens: Secondary | ICD-10-CM | POA: Diagnosis not present

## 2017-09-30 DIAGNOSIS — H04123 Dry eye syndrome of bilateral lacrimal glands: Secondary | ICD-10-CM | POA: Diagnosis not present

## 2017-09-30 LAB — HM DIABETES EYE EXAM

## 2017-10-04 DIAGNOSIS — R1312 Dysphagia, oropharyngeal phase: Secondary | ICD-10-CM | POA: Diagnosis not present

## 2017-10-04 DIAGNOSIS — G20 Parkinson's disease: Secondary | ICD-10-CM | POA: Diagnosis not present

## 2017-10-04 DIAGNOSIS — J9601 Acute respiratory failure with hypoxia: Secondary | ICD-10-CM | POA: Diagnosis not present

## 2017-10-04 DIAGNOSIS — G232 Striatonigral degeneration: Secondary | ICD-10-CM | POA: Diagnosis not present

## 2017-10-04 DIAGNOSIS — G214 Vascular parkinsonism: Secondary | ICD-10-CM | POA: Diagnosis not present

## 2017-10-09 DIAGNOSIS — R1312 Dysphagia, oropharyngeal phase: Secondary | ICD-10-CM | POA: Diagnosis not present

## 2017-10-09 DIAGNOSIS — G20 Parkinson's disease: Secondary | ICD-10-CM | POA: Diagnosis not present

## 2017-10-18 ENCOUNTER — Other Ambulatory Visit: Payer: Self-pay | Admitting: Internal Medicine

## 2017-10-20 ENCOUNTER — Other Ambulatory Visit: Payer: Self-pay | Admitting: Nurse Practitioner

## 2017-10-21 ENCOUNTER — Other Ambulatory Visit: Payer: Self-pay

## 2017-10-21 MED ORDER — ONETOUCH DELICA LANCETS FINE MISC: 11 refills | Status: AC

## 2017-10-21 MED ORDER — GLUCOSE BLOOD VI STRP: 1.0000 | ORAL_STRIP | Freq: Three times a day (TID) | 11 refills | Status: DC

## 2017-10-21 NOTE — Telephone Encounter (Signed)
[facility] one touch ultra blue test strips and lancets. I confirmed with patient's wife that one touch were the correct supplies and she stated that they were. Rx sent electronically.

## 2017-10-22 ENCOUNTER — Ambulatory Visit: Payer: Medicare Other | Admitting: Neurology

## 2017-10-22 ENCOUNTER — Encounter

## 2017-11-02 ENCOUNTER — Telehealth: Payer: Self-pay

## 2017-11-02 ENCOUNTER — Other Ambulatory Visit: Payer: Medicare Other

## 2017-11-02 DIAGNOSIS — E1143 Type 2 diabetes mellitus with diabetic autonomic (poly)neuropathy: Secondary | ICD-10-CM | POA: Diagnosis not present

## 2017-11-02 NOTE — Telephone Encounter (Signed)
Patient's wife asked that a UA be done on patient due to increased memory issues recently. Mrs. Harleman was informed that an appointment was normally necessary to determine cause of cognitive changes. Patient is scheduled to see Dr. Renato Gails on 11/04/17, so Mrs. Solow asked that a specimen cup be sent in case Dr. Renato Gails wanted a urine specimen for that visit.   Urine cup was provided with instructions to secure a clean catch specimen.   Should an order for a UA be placed prior to visit on 11/04/17.

## 2017-11-02 NOTE — Telephone Encounter (Signed)
Mrs. Stmarie expressed understanding.

## 2017-11-02 NOTE — Telephone Encounter (Signed)
No order until seen.  Pt also recurrently aspirates and sometimes that is why he is delirious in addition to his baseline dementia from Parkinsons.  Encourage water intake until visit.

## 2017-11-03 LAB — CBC WITH DIFFERENTIAL/PLATELET
Basophils Absolute: 52 cells/uL (ref 0–200)
Basophils Relative: 0.6 %
Eosinophils Absolute: 209 cells/uL (ref 15–500)
Eosinophils Relative: 2.4 %
HCT: 42.6 % (ref 38.5–50.0)
Hemoglobin: 14.3 g/dL (ref 13.2–17.1)
Lymphs Abs: 2245 cells/uL (ref 850–3900)
MCH: 31.2 pg (ref 27.0–33.0)
MCHC: 33.6 g/dL (ref 32.0–36.0)
MCV: 92.8 fL (ref 80.0–100.0)
MPV: 10.3 fL (ref 7.5–12.5)
Monocytes Relative: 6.2 %
Neutro Abs: 5655 cells/uL (ref 1500–7800)
Neutrophils Relative %: 65 %
Platelets: 262 10*3/uL (ref 140–400)
RBC: 4.59 10*6/uL (ref 4.20–5.80)
RDW: 12.8 % (ref 11.0–15.0)
Total Lymphocyte: 25.8 %
WBC mixed population: 539 cells/uL (ref 200–950)
WBC: 8.7 10*3/uL (ref 3.8–10.8)

## 2017-11-03 LAB — BASIC METABOLIC PANEL
BUN: 18 mg/dL (ref 7–25)
CO2: 31 mmol/L (ref 20–32)
Calcium: 9.8 mg/dL (ref 8.6–10.3)
Chloride: 102 mmol/L (ref 98–110)
Creat: 1.08 mg/dL (ref 0.70–1.11)
Glucose, Bld: 99 mg/dL (ref 65–99)
Potassium: 3.8 mmol/L (ref 3.5–5.3)
Sodium: 141 mmol/L (ref 135–146)

## 2017-11-03 LAB — HEMOGLOBIN A1C
Hgb A1c MFr Bld: 5.7 % of total Hgb — ABNORMAL HIGH (ref <5.7)
Mean Plasma Glucose: 117 (calc)
eAG (mmol/L): 6.5 (calc)

## 2017-11-04 ENCOUNTER — Encounter: Payer: Self-pay | Admitting: Internal Medicine

## 2017-11-04 ENCOUNTER — Ambulatory Visit (INDEPENDENT_AMBULATORY_CARE_PROVIDER_SITE_OTHER): Payer: Medicare Other | Admitting: Internal Medicine

## 2017-11-04 VITALS — BP 132/70 | HR 76 | Temp 97.5°F | Ht 73.0 in | Wt 154.0 lb

## 2017-11-04 DIAGNOSIS — E1143 Type 2 diabetes mellitus with diabetic autonomic (poly)neuropathy: Secondary | ICD-10-CM

## 2017-11-04 DIAGNOSIS — Z23 Encounter for immunization: Secondary | ICD-10-CM | POA: Diagnosis not present

## 2017-11-04 DIAGNOSIS — K5900 Constipation, unspecified: Secondary | ICD-10-CM | POA: Diagnosis not present

## 2017-11-04 DIAGNOSIS — R1319 Other dysphagia: Secondary | ICD-10-CM | POA: Diagnosis not present

## 2017-11-04 DIAGNOSIS — G214 Vascular parkinsonism: Secondary | ICD-10-CM

## 2017-11-04 DIAGNOSIS — R531 Weakness: Secondary | ICD-10-CM

## 2017-11-04 DIAGNOSIS — R269 Unspecified abnormalities of gait and mobility: Secondary | ICD-10-CM

## 2017-11-04 NOTE — Progress Notes (Signed)
Location:  [facility] clinic Provider:  Elainah Rhyne L. Renato Gails, D.O., C.M.D.  Code Status: DNR Goals of Care:  Advanced Directives 08/04/2017  Does Patient Have a Medical Advance Directive? Yes  Type of Estate agent of [place];Living will  Does patient want to make changes to medical advance directive? -  Copy of Healthcare Power of Attorney in Chart? Yes   Chief Complaint  Patient presents with  . Medical Management of Chronic Issues    follow-up    HPI: Patient is a 84 y.o. male seen today for medical management of chronic diseases. Here with this wife and his daughter.  Shoulders are hurting him.  Hurt when he moves.  Dr. Darrelyn Hillock has said he has bone against bone arthritis.  He is cold all the time. Feet and hands are cold.  His wife covers him with a throw despite 75 degrees in the house.    He's lost 10 lbs.  He does eat his fair share of sweets.    He has a deformed left finger.    He is constipated. She gives him fleets enemas.  She tries to push fluids on him.  He always has something beside his chair.    BP running between 80 and 90 lately diastolic.  Here bp is good.  Has h/o orthostatic hypotension.  Kathie Rhodes feels like he's more confused.  She says he's actually been gradually getting worse over the past few months to a year.  Gets tv and chair controls mixed up.  Difficulty with dates.    He sleeps later during the day--doesn't want to get up till at least noon (used to be 10:30-11am).  Would sleep even later.  Has trouble then going to bed at 10-11pm and doesn't sleep until 1-2am.  He's not had hallucinations.    Bladder control has worsened.  He hardly makes it to the bathroom now.  Wears depends with guards.  Does not try to get up anymore.  Does not have fecal incontinence.  May go almost a weak.  Kathie Rhodes has been giving him stool softeners also otc--psyllium.  He gets choked all the time and spits up awful stuff.  He gets a rattle afterwards also.   They have a suction apparatus that he uses.    Past Medical History:  Diagnosis Date  . Abnormality of gait   . Arthritis   . Bifascicular block   . Cyanocobalamin deficiency   . Depression   . Diverticulitis   . Dizzy   . Dysphagia, idiopathic    History of aspiration  . Gait disturbance   . Hearing loss of both ears   . Hyperlipidemia   . Hypertension   . Lumbar back pain   . Memory loss   . Neck pain   . Osteoarthritis   . Osteoporosis   . Other and unspecified hyperlipidemia   . Parkinsonism ([facility])   . Recurrent falls    Using a cane and walker  . Restless leg   . Restless legs syndrome (RLS)   . Right knee pain   . Shoulder pain   . Thoracic back pain   . Thyroid disease   . Type 2 diabetes mellitus with autonomic neuropathy ([facility])   . Unspecified essential hypertension   . Unspecified hypothyroidism   . Vitamin D deficiency     Past Surgical History:  Procedure Laterality Date  . CATARACT EXTRACTION Bilateral 2012   Dr. Hazle Quant    Allergies  Allergen Reactions  .  Caffeine Swelling and Other (See Comments)    Reaction:  Joint swelling    Outpatient Encounter Medications as of 11/04/2017  Medication Sig  . aspirin EC 81 MG tablet Take 81 mg by mouth daily.  . benazepril (LOTENSIN) 5 MG tablet TAKE 1 TABLET BY MOUTH  DAILY  . Calcium Carbonate-Vitamin D (CALCIUM-CARB 600 + D) 600-125 MG-UNIT TABS Take 1 tablet by mouth 2 (two) times daily.   . carbidopa-levodopa (SINEMET IR) 25-100 MG tablet Take 3 tablets by mouth 2 (two) times daily.  . cyanocobalamin (,VITAMIN B-12,) 1000 MCG/ML injection Inject 1 mL (1,000 mcg total) into the muscle every 30 (thirty) days. Pt receives on the 15th of every month.  . diclofenac sodium (VOLTAREN) 1 % GEL Apply 4 g topically 4 (four) times daily. To affected knee  . gabapentin (NEURONTIN) 300 MG capsule TAKE 1 CAPSULE BY MOUTH AT  BEDTIME  . glucose blood (ONE TOUCH ULTRA TEST) test strip 1 each by Other route 3 (three) times  daily. Dx: E11.43  . metFORMIN (GLUCOPHAGE) 1000 MG tablet TAKE 1 TABLET BY MOUTH TWO  TIMES DAILY WITH MEALS  . mirtazapine (REMERON) 30 MG tablet TAKE 1 TABLET BY MOUTH AT  BEDTIME  . mupirocin ointment (BACTROBAN) 2 % APPLY  OINTMENT TOPICALLY AT BEDTIME TO  LESION  ON  SCROTUM  . ONETOUCH DELICA LANCETS FINE MISC Use to check blood glucose three times daily. Dx: E11.43  . pantoprazole (PROTONIX) 40 MG tablet TAKE 1 TABLET BY MOUTH TWO  TIMES DAILY  . pravastatin (PRAVACHOL) 20 MG tablet TAKE 1 TABLET BY MOUTH AT  BEDTIME  . tamsulosin (FLOMAX) 0.4 MG CAPS capsule TAKE 1 CAPSULE BY MOUTH AT  BEDTIME  . [DISCONTINUED] AMBULATORY NON FORMULARY MEDICATION Personal Care Services 3-6 hours to assist with bathing and dressing Dx: G21.4, R53.1, I95.1   No facility-administered encounter medications on file as of 11/04/2017.     Review of Systems:  Review of Systems  Constitutional: Positive for malaise/fatigue and weight loss. Negative for chills and fever.  HENT: Positive for hearing loss. Negative for congestion.        Hearing aids  Eyes: Negative for blurred vision.       Glasses  Respiratory: Positive for cough and sputum production. Negative for shortness of breath and wheezing.   Cardiovascular: Negative for chest pain, palpitations and leg swelling.  Gastrointestinal: Positive for constipation. Negative for abdominal pain, blood in stool, diarrhea and melena.  Genitourinary: Negative for dysuria.  Musculoskeletal: Positive for joint pain and myalgias. Negative for falls.  Skin: Negative for itching and rash.  Neurological: Positive for weakness. Negative for dizziness and loss of consciousness.  Psychiatric/Behavioral: Positive for memory loss. Negative for depression. The patient is not nervous/anxious and does not have insomnia.     Health Maintenance  Topic Date Due  . COLONOSCOPY  11/23/2004  . INFLUENZA VACCINE  09/23/2017  . FOOT EXAM  10/22/2017  . HEMOGLOBIN A1C   05/03/2018  . OPHTHALMOLOGY EXAM  10/01/2018  . TETANUS/TDAP  05/28/2027  . PNA vac Low Risk Adult  Completed    Physical Exam: Vitals:   11/04/17 1101  BP: 132/70  Pulse: 76  Temp: (!) 97.5 F (36.4 C)  TempSrc: Oral  SpO2: 92%  Weight: 154 lb (69.9 kg)  Height: 6\' 1"  (1.854 m)   Body mass index is 20.32 kg/m. Physical Exam  Constitutional: No distress.  HENT:  Head: Normocephalic and atraumatic.  Hearing aids  Eyes:  glasses  Cardiovascular: Normal rate, regular rhythm, normal heart sounds and intact distal pulses.  Pulmonary/Chest: Effort normal. No respiratory distress. He has no wheezes. He has no rales.  Coarse rhonchi throughout lung fields  Abdominal: Soft. Bowel sounds are normal. He exhibits no distension. There is no tenderness.  Musculoskeletal: Normal range of motion.  Come in wheelchair, very weak and requires assistance for transfers  Neurological: He is alert.  Poor historian some of the time; asks his wife to contribute to history  Skin: Skin is warm and dry.    Labs reviewed: Basic Metabolic Panel: Recent Labs    01/25/17 0535  06/21/17 1224 07/09/17 1008 11/02/17 1119  NA 140   < > 138 142 141  K 2.9*   < > 4.0 4.2 3.8  CL 108   < > 100 103 102  CO2 24   < > 28 29 31   GLUCOSE 108*   < > 153* 114* 99  BUN 17   < > 32* 20 18  CREATININE 0.90   < > 1.53* 1.08 1.08  CALCIUM 8.1*   < > 9.6 9.9 9.8  MG 1.2*  --   --   --   --    < > = values in this interval not displayed.   Liver Function Tests: Recent Labs    12/08/16 1216 01/22/17 1720 07/09/17 1008  AST 14 83* 13  ALT 6 34 4*  ALKPHOS 90 84  --   BILITOT 0.4 1.3* 0.7  PROT 6.7 7.1 6.4  ALBUMIN 4.2 3.9  --    Recent Labs    01/22/17 1720  LIPASE 21   No results for input(s): AMMONIA in the last 8760 hours. CBC: Recent Labs    06/21/17 1224 07/09/17 1008 11/02/17 1119  WBC 13.0* 7.0 8.7  NEUTROABS 9,893* 3,801 5,655  HGB 13.7 14.3 14.3  HCT 40.0 42.3 42.6  MCV 91.5  92.0 92.8  PLT 261 294 262   Lipid Panel: No results for input(s): CHOL, HDL, LDLCALC, TRIG, CHOLHDL, LDLDIRECT in the last 8760 hours. Lab Results  Component Value Date   HGBA1C 5.7 (H) 11/02/2017    Assessment/Plan 1. Vascular parkinsonism ([facility]) - progressively weaker - refer for more home health PT, OT, ST--has not been seen since march of last year -cognition also declining and dysphagia seems to be worse with more coughing with meals--using suction - Ambulatory referral to Home Health  2. Weakness -increased lately, will get therapy involved again to help prevent falls and prevent his wife from getting injured again - Ambulatory referral to Home Health  3. Constipation, unspecified constipation type -worse lately per pt--using stool softeners and fleets enemas  4. Neurogenic dysphagia - progressive, repeat ST at home for this more than his speech volume - Ambulatory referral to Home Health -cognition has been a gradual decline, nothing suggests a UTI  5. Gait difficulty - Ambulatory referral to Home Health - not ambulating lately, using wheelchair and recliner   6. Type 2 diabetes mellitus with diabetic autonomic neuropathy, without long-term current use of insulin ([facility]) -control is great with metformin, no known lows, still eats sweets so will keep metformin  7. Need for influenza vaccination -given today  Labs/tests ordered:   Orders Placed This Encounter  Procedures  . Flu vaccine HIGH DOSE PF (Fluzone High dose)  . Ambulatory referral to Home Health    Referral Priority:   Routine    Referral Type:   Home Health Care  Referral Reason:   Specialty Services Required    Requested Specialty:   Home Health Services    Number of Visits Requested:   1   Next appt:  4 mos med mgt with same day labs   Wacey Zieger L. Ivery Nanney, D.O. Geriatrics [facility] [address], [place] [address] Cell Phone (Mon-Fri 8am-5pm):   [phone] On Call:  [phone] & follow prompts after 5pm & weekends Office Phone:  [phone] Office Fax:  [phone]

## 2017-11-09 DIAGNOSIS — R1312 Dysphagia, oropharyngeal phase: Secondary | ICD-10-CM | POA: Diagnosis not present

## 2017-11-09 DIAGNOSIS — G20 Parkinson's disease: Secondary | ICD-10-CM | POA: Diagnosis not present

## 2017-11-10 DIAGNOSIS — E1143 Type 2 diabetes mellitus with diabetic autonomic (poly)neuropathy: Secondary | ICD-10-CM

## 2017-11-10 DIAGNOSIS — M1991 Primary osteoarthritis, unspecified site: Secondary | ICD-10-CM

## 2017-11-10 DIAGNOSIS — G214 Vascular parkinsonism: Secondary | ICD-10-CM | POA: Diagnosis not present

## 2017-11-10 DIAGNOSIS — H9193 Unspecified hearing loss, bilateral: Secondary | ICD-10-CM | POA: Diagnosis not present

## 2017-11-10 DIAGNOSIS — D519 Vitamin B12 deficiency anemia, unspecified: Secondary | ICD-10-CM | POA: Diagnosis not present

## 2017-11-10 DIAGNOSIS — M810 Age-related osteoporosis without current pathological fracture: Secondary | ICD-10-CM

## 2017-11-10 DIAGNOSIS — G232 Striatonigral degeneration: Secondary | ICD-10-CM | POA: Diagnosis not present

## 2017-11-10 DIAGNOSIS — E1122 Type 2 diabetes mellitus with diabetic chronic kidney disease: Secondary | ICD-10-CM | POA: Diagnosis not present

## 2017-11-10 DIAGNOSIS — E559 Vitamin D deficiency, unspecified: Secondary | ICD-10-CM | POA: Diagnosis not present

## 2017-11-10 DIAGNOSIS — M19012 Primary osteoarthritis, left shoulder: Secondary | ICD-10-CM | POA: Diagnosis not present

## 2017-11-10 DIAGNOSIS — Z7984 Long term (current) use of oral hypoglycemic drugs: Secondary | ICD-10-CM | POA: Diagnosis not present

## 2017-11-10 DIAGNOSIS — M1711 Unilateral primary osteoarthritis, right knee: Secondary | ICD-10-CM | POA: Diagnosis not present

## 2017-11-10 DIAGNOSIS — R1319 Other dysphagia: Secondary | ICD-10-CM | POA: Diagnosis not present

## 2017-11-10 DIAGNOSIS — I129 Hypertensive chronic kidney disease with stage 1 through stage 4 chronic kidney disease, or unspecified chronic kidney disease: Secondary | ICD-10-CM | POA: Diagnosis not present

## 2017-11-10 DIAGNOSIS — M19011 Primary osteoarthritis, right shoulder: Secondary | ICD-10-CM | POA: Diagnosis not present

## 2017-11-10 DIAGNOSIS — Z87440 Personal history of urinary (tract) infections: Secondary | ICD-10-CM | POA: Diagnosis not present

## 2017-11-10 DIAGNOSIS — I452 Bifascicular block: Secondary | ICD-10-CM | POA: Diagnosis not present

## 2017-11-10 DIAGNOSIS — N182 Chronic kidney disease, stage 2 (mild): Secondary | ICD-10-CM

## 2017-11-10 DIAGNOSIS — Z87891 Personal history of nicotine dependence: Secondary | ICD-10-CM | POA: Diagnosis not present

## 2017-11-10 DIAGNOSIS — Z7982 Long term (current) use of aspirin: Secondary | ICD-10-CM | POA: Diagnosis not present

## 2017-11-10 DIAGNOSIS — G2581 Restless legs syndrome: Secondary | ICD-10-CM | POA: Diagnosis not present

## 2017-11-10 DIAGNOSIS — M5033 Other cervical disc degeneration, cervicothoracic region: Secondary | ICD-10-CM | POA: Diagnosis not present

## 2017-11-10 DIAGNOSIS — Z9181 History of falling: Secondary | ICD-10-CM | POA: Diagnosis not present

## 2017-11-10 DIAGNOSIS — E43 Unspecified severe protein-calorie malnutrition: Secondary | ICD-10-CM | POA: Diagnosis not present

## 2017-11-16 DIAGNOSIS — I452 Bifascicular block: Secondary | ICD-10-CM | POA: Diagnosis not present

## 2017-11-16 DIAGNOSIS — Z9181 History of falling: Secondary | ICD-10-CM | POA: Diagnosis not present

## 2017-11-16 DIAGNOSIS — H9193 Unspecified hearing loss, bilateral: Secondary | ICD-10-CM | POA: Diagnosis not present

## 2017-11-16 DIAGNOSIS — M19011 Primary osteoarthritis, right shoulder: Secondary | ICD-10-CM | POA: Diagnosis not present

## 2017-11-16 DIAGNOSIS — M810 Age-related osteoporosis without current pathological fracture: Secondary | ICD-10-CM | POA: Diagnosis not present

## 2017-11-16 DIAGNOSIS — E1143 Type 2 diabetes mellitus with diabetic autonomic (poly)neuropathy: Secondary | ICD-10-CM | POA: Diagnosis not present

## 2017-11-16 DIAGNOSIS — M5033 Other cervical disc degeneration, cervicothoracic region: Secondary | ICD-10-CM | POA: Diagnosis not present

## 2017-11-16 DIAGNOSIS — E559 Vitamin D deficiency, unspecified: Secondary | ICD-10-CM | POA: Diagnosis not present

## 2017-11-16 DIAGNOSIS — Z87440 Personal history of urinary (tract) infections: Secondary | ICD-10-CM | POA: Diagnosis not present

## 2017-11-16 DIAGNOSIS — I129 Hypertensive chronic kidney disease with stage 1 through stage 4 chronic kidney disease, or unspecified chronic kidney disease: Secondary | ICD-10-CM | POA: Diagnosis not present

## 2017-11-16 DIAGNOSIS — M19012 Primary osteoarthritis, left shoulder: Secondary | ICD-10-CM | POA: Diagnosis not present

## 2017-11-16 DIAGNOSIS — Z87891 Personal history of nicotine dependence: Secondary | ICD-10-CM | POA: Diagnosis not present

## 2017-11-16 DIAGNOSIS — E43 Unspecified severe protein-calorie malnutrition: Secondary | ICD-10-CM | POA: Diagnosis not present

## 2017-11-16 DIAGNOSIS — Z7984 Long term (current) use of oral hypoglycemic drugs: Secondary | ICD-10-CM | POA: Diagnosis not present

## 2017-11-16 DIAGNOSIS — N182 Chronic kidney disease, stage 2 (mild): Secondary | ICD-10-CM | POA: Diagnosis not present

## 2017-11-16 DIAGNOSIS — E1122 Type 2 diabetes mellitus with diabetic chronic kidney disease: Secondary | ICD-10-CM | POA: Diagnosis not present

## 2017-11-16 DIAGNOSIS — D519 Vitamin B12 deficiency anemia, unspecified: Secondary | ICD-10-CM | POA: Diagnosis not present

## 2017-11-16 DIAGNOSIS — G214 Vascular parkinsonism: Secondary | ICD-10-CM | POA: Diagnosis not present

## 2017-11-16 DIAGNOSIS — R1319 Other dysphagia: Secondary | ICD-10-CM | POA: Diagnosis not present

## 2017-11-16 DIAGNOSIS — Z7982 Long term (current) use of aspirin: Secondary | ICD-10-CM | POA: Diagnosis not present

## 2017-11-16 DIAGNOSIS — G2581 Restless legs syndrome: Secondary | ICD-10-CM | POA: Diagnosis not present

## 2017-11-16 DIAGNOSIS — M1711 Unilateral primary osteoarthritis, right knee: Secondary | ICD-10-CM | POA: Diagnosis not present

## 2017-11-21 ENCOUNTER — Emergency Department (HOSPITAL_COMMUNITY): Payer: Medicare Other

## 2017-11-21 ENCOUNTER — Inpatient Hospital Stay (HOSPITAL_COMMUNITY)
Admission: EM | Admit: 2017-11-21 | Discharge: 2017-11-24 | DRG: 312 | Disposition: A | Discharge status: Home Health | Payer: Medicare Other | Attending: Internal Medicine | Admitting: Internal Medicine

## 2017-11-21 ENCOUNTER — Observation Stay (HOSPITAL_COMMUNITY): Payer: Medicare Other

## 2017-11-21 ENCOUNTER — Encounter (HOSPITAL_COMMUNITY): Payer: Self-pay | Admitting: Emergency Medicine

## 2017-11-21 DIAGNOSIS — R55 Syncope and collapse: Secondary | ICD-10-CM | POA: Diagnosis not present

## 2017-11-21 DIAGNOSIS — Z7984 Long term (current) use of oral hypoglycemic drugs: Secondary | ICD-10-CM

## 2017-11-21 DIAGNOSIS — Z7982 Long term (current) use of aspirin: Secondary | ICD-10-CM

## 2017-11-21 DIAGNOSIS — Z91048 Other nonmedicinal substance allergy status: Secondary | ICD-10-CM

## 2017-11-21 DIAGNOSIS — H9193 Unspecified hearing loss, bilateral: Secondary | ICD-10-CM | POA: Diagnosis present

## 2017-11-21 DIAGNOSIS — W19XXXA Unspecified fall, initial encounter: Secondary | ICD-10-CM | POA: Diagnosis present

## 2017-11-21 DIAGNOSIS — R1313 Dysphagia, pharyngeal phase: Secondary | ICD-10-CM | POA: Diagnosis not present

## 2017-11-21 DIAGNOSIS — E1122 Type 2 diabetes mellitus with diabetic chronic kidney disease: Secondary | ICD-10-CM | POA: Diagnosis not present

## 2017-11-21 DIAGNOSIS — G2581 Restless legs syndrome: Secondary | ICD-10-CM | POA: Diagnosis not present

## 2017-11-21 DIAGNOSIS — I340 Nonrheumatic mitral (valve) insufficiency: Secondary | ICD-10-CM | POA: Diagnosis not present

## 2017-11-21 DIAGNOSIS — S80821A Blister (nonthermal), right lower leg, initial encounter: Secondary | ICD-10-CM | POA: Diagnosis not present

## 2017-11-21 DIAGNOSIS — X58XXXA Exposure to other specified factors, initial encounter: Secondary | ICD-10-CM | POA: Diagnosis present

## 2017-11-21 DIAGNOSIS — E7849 Other hyperlipidemia: Secondary | ICD-10-CM | POA: Diagnosis present

## 2017-11-21 DIAGNOSIS — Z823 Family history of stroke: Secondary | ICD-10-CM | POA: Diagnosis not present

## 2017-11-21 DIAGNOSIS — G232 Striatonigral degeneration: Secondary | ICD-10-CM | POA: Diagnosis present

## 2017-11-21 DIAGNOSIS — Z8249 Family history of ischemic heart disease and other diseases of the circulatory system: Secondary | ICD-10-CM | POA: Diagnosis not present

## 2017-11-21 DIAGNOSIS — E1143 Type 2 diabetes mellitus with diabetic autonomic (poly)neuropathy: Secondary | ICD-10-CM | POA: Diagnosis not present

## 2017-11-21 DIAGNOSIS — Z791 Long term (current) use of non-steroidal anti-inflammatories (NSAID): Secondary | ICD-10-CM

## 2017-11-21 DIAGNOSIS — Z6820 Body mass index (BMI) 20.0-20.9, adult: Secondary | ICD-10-CM | POA: Diagnosis not present

## 2017-11-21 DIAGNOSIS — J690 Pneumonitis due to inhalation of food and vomit: Secondary | ICD-10-CM | POA: Diagnosis not present

## 2017-11-21 DIAGNOSIS — T24231A Burn of second degree of right lower leg, initial encounter: Secondary | ICD-10-CM | POA: Diagnosis present

## 2017-11-21 DIAGNOSIS — I452 Bifascicular block: Secondary | ICD-10-CM | POA: Diagnosis present

## 2017-11-21 DIAGNOSIS — I10 Essential (primary) hypertension: Secondary | ICD-10-CM | POA: Diagnosis present

## 2017-11-21 DIAGNOSIS — R918 Other nonspecific abnormal finding of lung field: Secondary | ICD-10-CM | POA: Diagnosis not present

## 2017-11-21 DIAGNOSIS — R404 Transient alteration of awareness: Secondary | ICD-10-CM | POA: Diagnosis not present

## 2017-11-21 DIAGNOSIS — R402362 Coma scale, best motor response, obeys commands, at arrival to emergency department: Secondary | ICD-10-CM | POA: Diagnosis present

## 2017-11-21 DIAGNOSIS — E039 Hypothyroidism, unspecified: Secondary | ICD-10-CM | POA: Diagnosis not present

## 2017-11-21 DIAGNOSIS — R7989 Other specified abnormal findings of blood chemistry: Secondary | ICD-10-CM | POA: Insufficient documentation

## 2017-11-21 DIAGNOSIS — R402252 Coma scale, best verbal response, oriented, at arrival to emergency department: Secondary | ICD-10-CM | POA: Diagnosis present

## 2017-11-21 DIAGNOSIS — R748 Abnormal levels of other serum enzymes: Secondary | ICD-10-CM | POA: Diagnosis not present

## 2017-11-21 DIAGNOSIS — E538 Deficiency of other specified B group vitamins: Secondary | ICD-10-CM | POA: Diagnosis not present

## 2017-11-21 DIAGNOSIS — Z9841 Cataract extraction status, right eye: Secondary | ICD-10-CM

## 2017-11-21 DIAGNOSIS — R778 Other specified abnormalities of plasma proteins: Secondary | ICD-10-CM

## 2017-11-21 DIAGNOSIS — E559 Vitamin D deficiency, unspecified: Secondary | ICD-10-CM | POA: Diagnosis present

## 2017-11-21 DIAGNOSIS — Z87891 Personal history of nicotine dependence: Secondary | ICD-10-CM | POA: Diagnosis not present

## 2017-11-21 DIAGNOSIS — R402142 Coma scale, eyes open, spontaneous, at arrival to emergency department: Secondary | ICD-10-CM | POA: Diagnosis present

## 2017-11-21 DIAGNOSIS — Z743 Need for continuous supervision: Secondary | ICD-10-CM | POA: Diagnosis not present

## 2017-11-21 DIAGNOSIS — N182 Chronic kidney disease, stage 2 (mild): Secondary | ICD-10-CM | POA: Diagnosis not present

## 2017-11-21 DIAGNOSIS — Z833 Family history of diabetes mellitus: Secondary | ICD-10-CM

## 2017-11-21 DIAGNOSIS — G20 Parkinson's disease: Secondary | ICD-10-CM | POA: Diagnosis not present

## 2017-11-21 DIAGNOSIS — M810 Age-related osteoporosis without current pathological fracture: Secondary | ICD-10-CM | POA: Diagnosis present

## 2017-11-21 DIAGNOSIS — E119 Type 2 diabetes mellitus without complications: Secondary | ICD-10-CM | POA: Diagnosis present

## 2017-11-21 DIAGNOSIS — E1129 Type 2 diabetes mellitus with other diabetic kidney complication: Secondary | ICD-10-CM | POA: Diagnosis present

## 2017-11-21 DIAGNOSIS — R0902 Hypoxemia: Secondary | ICD-10-CM | POA: Diagnosis not present

## 2017-11-21 DIAGNOSIS — Z79899 Other long term (current) drug therapy: Secondary | ICD-10-CM

## 2017-11-21 DIAGNOSIS — E43 Unspecified severe protein-calorie malnutrition: Secondary | ICD-10-CM | POA: Diagnosis not present

## 2017-11-21 DIAGNOSIS — R9389 Abnormal findings on diagnostic imaging of other specified body structures: Secondary | ICD-10-CM

## 2017-11-21 DIAGNOSIS — R4020 Unspecified coma: Secondary | ICD-10-CM | POA: Diagnosis not present

## 2017-11-21 DIAGNOSIS — Z9842 Cataract extraction status, left eye: Secondary | ICD-10-CM

## 2017-11-21 DIAGNOSIS — Z8042 Family history of malignant neoplasm of prostate: Secondary | ICD-10-CM

## 2017-11-21 DIAGNOSIS — Y92002 Bathroom of unspecified non-institutional (private) residence single-family (private) house as the place of occurrence of the external cause: Secondary | ICD-10-CM

## 2017-11-21 DIAGNOSIS — Z9181 History of falling: Secondary | ICD-10-CM

## 2017-11-21 LAB — URINALYSIS, ROUTINE W REFLEX MICROSCOPIC
BILIRUBIN URINE: NEGATIVE
Bacteria, UA: NONE SEEN
Glucose, UA: NEGATIVE mg/dL
KETONES UR: 20 mg/dL — AB
LEUKOCYTES UA: NEGATIVE
Nitrite: NEGATIVE
PROTEIN: NEGATIVE mg/dL
Specific Gravity, Urine: 1.013 (ref 1.005–1.030)
pH: 6 (ref 5.0–8.0)

## 2017-11-21 LAB — CBC WITH DIFFERENTIAL/PLATELET
ABS IMMATURE GRANULOCYTES: 0.1 10*3/uL (ref 0.0–0.1)
BASOS PCT: 0 %
Basophils Absolute: 0 10*3/uL (ref 0.0–0.1)
Eosinophils Absolute: 0 10*3/uL (ref 0.0–0.7)
Eosinophils Relative: 0 %
HEMATOCRIT: 48 % (ref 39.0–52.0)
Hemoglobin: 16 g/dL (ref 13.0–17.0)
IMMATURE GRANULOCYTES: 0 %
LYMPHS ABS: 0.7 10*3/uL (ref 0.7–4.0)
Lymphocytes Relative: 4 %
MCH: 31.7 pg (ref 26.0–34.0)
MCHC: 33.3 g/dL (ref 30.0–36.0)
MCV: 95 fL (ref 78.0–100.0)
MONOS PCT: 3 %
Monocytes Absolute: 0.5 10*3/uL (ref 0.1–1.0)
NEUTROS ABS: 15.1 10*3/uL — AB (ref 1.7–7.7)
NEUTROS PCT: 93 %
Platelets: 281 10*3/uL (ref 150–400)
RBC: 5.05 MIL/uL (ref 4.22–5.81)
RDW: 13.5 % (ref 11.5–15.5)
WBC: 16.5 10*3/uL — ABNORMAL HIGH (ref 4.0–10.5)

## 2017-11-21 LAB — COMPREHENSIVE METABOLIC PANEL
ALT: 15 U/L (ref 0–44)
ANION GAP: 13 (ref 5–15)
AST: 27 U/L (ref 15–41)
Albumin: 3.9 g/dL (ref 3.5–5.0)
Alkaline Phosphatase: 63 U/L (ref 38–126)
BUN: 21 mg/dL (ref 8–23)
CHLORIDE: 105 mmol/L (ref 98–111)
CO2: 24 mmol/L (ref 22–32)
Calcium: 9.9 mg/dL (ref 8.9–10.3)
Creatinine, Ser: 1.1 mg/dL (ref 0.61–1.24)
GFR calc non Af Amer: 60 mL/min — ABNORMAL LOW (ref >60)
Glucose, Bld: 132 mg/dL — ABNORMAL HIGH (ref 70–99)
POTASSIUM: 3.8 mmol/L (ref 3.5–5.1)
SODIUM: 142 mmol/L (ref 135–145)
Total Bilirubin: 1.1 mg/dL (ref 0.3–1.2)
Total Protein: 7.1 g/dL (ref 6.5–8.1)

## 2017-11-21 LAB — TSH: TSH: 0.99 u[IU]/mL (ref 0.350–4.500)

## 2017-11-21 LAB — I-STAT TROPONIN, ED
Troponin i, poc: 0 ng/mL (ref 0.00–0.08)
Troponin i, poc: 0.14 ng/mL (ref 0.00–0.08)

## 2017-11-21 LAB — TROPONIN I
Troponin I: 0.04 ng/mL (ref <0.03)
Troponin I: 0.25 ng/mL (ref <0.03)

## 2017-11-21 LAB — CREATININE, SERUM: CREATININE: 1.08 mg/dL (ref 0.61–1.24)

## 2017-11-21 MED ORDER — VITAMIN B-6 100 MG PO TABS
100.0000 mg | ORAL_TABLET | Freq: Every day | ORAL | Status: DC
  Administered 2017-11-22 – 2017-11-24 (×3): 100 mg via ORAL
  Filled 2017-11-21 (×3): qty 1

## 2017-11-21 MED ORDER — SODIUM CHLORIDE 0.9 % IV BOLUS
1000.0000 mL | Freq: Once | INTRAVENOUS | Status: AC
  Administered 2017-11-21: 1000 mL via INTRAVENOUS

## 2017-11-21 MED ORDER — ONDANSETRON HCL 4 MG PO TABS: 4.0000 mg | ORAL_TABLET | Freq: Four times a day (QID) | ORAL | Status: DC | PRN

## 2017-11-21 MED ORDER — ASPIRIN 300 MG RE SUPP
300.0000 mg | Freq: Once | RECTAL | Status: AC
  Administered 2017-11-21: 300 mg via RECTAL
  Filled 2017-11-21: qty 1

## 2017-11-21 MED ORDER — MIRTAZAPINE 30 MG PO TABS
30.0000 mg | ORAL_TABLET | Freq: Every day | ORAL | Status: DC
  Administered 2017-11-22 – 2017-11-23 (×2): 30 mg via ORAL
  Filled 2017-11-21: qty 2
  Filled 2017-11-21: qty 1
  Filled 2017-11-21: qty 2
  Filled 2017-11-21 (×2): qty 1

## 2017-11-21 MED ORDER — ONDANSETRON HCL 4 MG/2ML IJ SOLN: 4.0000 mg | Freq: Four times a day (QID) | INTRAMUSCULAR | Status: DC | PRN

## 2017-11-21 MED ORDER — PANTOPRAZOLE SODIUM 40 MG PO TBEC
40.0000 mg | DELAYED_RELEASE_TABLET | Freq: Two times a day (BID) | ORAL | Status: DC
  Administered 2017-11-22 – 2017-11-24 (×5): 40 mg via ORAL
  Filled 2017-11-21 (×5): qty 1

## 2017-11-21 MED ORDER — CARBIDOPA-LEVODOPA 25-100 MG PO TABS
3.0000 | ORAL_TABLET | ORAL | Status: DC
  Administered 2017-11-22 – 2017-11-24 (×5): 3 via ORAL
  Filled 2017-11-21 (×5): qty 3

## 2017-11-21 MED ORDER — ACETAMINOPHEN 650 MG RE SUPP: 650.0000 mg | Freq: Four times a day (QID) | RECTAL | Status: DC | PRN

## 2017-11-21 MED ORDER — TAMSULOSIN HCL 0.4 MG PO CAPS
0.4000 mg | ORAL_CAPSULE | Freq: Every day | ORAL | Status: DC
  Administered 2017-11-22 – 2017-11-23 (×2): 0.4 mg via ORAL
  Filled 2017-11-21 (×2): qty 1

## 2017-11-21 MED ORDER — CALCIUM CARBONATE-VITAMIN D 500-200 MG-UNIT PO TABS
1.0000 | ORAL_TABLET | Freq: Two times a day (BID) | ORAL | Status: DC
  Administered 2017-11-22 – 2017-11-24 (×4): 1 via ORAL
  Filled 2017-11-21 (×3): qty 1

## 2017-11-21 MED ORDER — ASPIRIN EC 81 MG PO TBEC
81.0000 mg | DELAYED_RELEASE_TABLET | Freq: Every day | ORAL | Status: DC
  Administered 2017-11-22 – 2017-11-24 (×3): 81 mg via ORAL
  Filled 2017-11-21 (×3): qty 1

## 2017-11-21 MED ORDER — ACETAMINOPHEN 325 MG PO TABS: 650.0000 mg | ORAL_TABLET | Freq: Four times a day (QID) | ORAL | Status: DC | PRN

## 2017-11-21 MED ORDER — ENOXAPARIN SODIUM 40 MG/0.4ML ~~LOC~~ SOLN
40.0000 mg | SUBCUTANEOUS | Status: DC
  Administered 2017-11-22 – 2017-11-24 (×3): 40 mg via SUBCUTANEOUS
  Filled 2017-11-21 (×3): qty 0.4

## 2017-11-21 MED ORDER — PRAVASTATIN SODIUM 20 MG PO TABS
20.0000 mg | ORAL_TABLET | Freq: Every day | ORAL | Status: DC
  Administered 2017-11-22 – 2017-11-23 (×2): 20 mg via ORAL
  Filled 2017-11-21 (×2): qty 1

## 2017-11-21 MED ORDER — SODIUM CHLORIDE 0.9 % IV SOLN: INTRAVENOUS | Status: DC

## 2017-11-21 MED ORDER — BENAZEPRIL HCL 5 MG PO TABS
5.0000 mg | ORAL_TABLET | Freq: Every day | ORAL | Status: DC
  Administered 2017-11-22 – 2017-11-24 (×3): 5 mg via ORAL
  Filled 2017-11-21 (×4): qty 1

## 2017-11-21 MED ORDER — GABAPENTIN 300 MG PO CAPS
300.0000 mg | ORAL_CAPSULE | Freq: Every day | ORAL | Status: DC
  Administered 2017-11-22 – 2017-11-23 (×2): 300 mg via ORAL
  Filled 2017-11-21 (×2): qty 1

## 2017-11-21 MED ORDER — ASPIRIN 81 MG PO CHEW
324.0000 mg | CHEWABLE_TABLET | Freq: Once | ORAL | Status: DC
  Filled 2017-11-21: qty 4

## 2017-11-21 MED ORDER — SODIUM CHLORIDE 0.9 % IV SOLN
INTRAVENOUS | Status: AC
  Administered 2017-11-22: 04:00:00 via INTRAVENOUS

## 2017-11-21 MED ORDER — CALCIUM POLYCARBOPHIL 625 MG PO TABS
625.0000 mg | ORAL_TABLET | Freq: Every day | ORAL | Status: DC
  Administered 2017-11-22 – 2017-11-23 (×2): 625 mg via ORAL
  Filled 2017-11-21 (×3): qty 1

## 2017-11-21 NOTE — ED Notes (Signed)
Dr Jodi Mourning informed of troponin results .14

## 2017-11-21 NOTE — Consult Note (Signed)
[facility] Consult Note   Primary Physician:  Bufford Spikes Primary Cardiologist:   Rickey Barbara  Reason for Consultation: Syncope and elevated troponin  HPI:    Nicholas Barnett is an 84 year old gentleman with a past medical history significant for Parkinson's disease, memory loss, dysphagia, gait difficulty and previous recurrent falls, type 2 diabetes mellitus, hypertension, and hyperlipidemia who is admitted after an episode of unwitnessed syncope. The patient was found on the floor of the bathroom by his wife. He does not recall what happened. He was warm to touch with the shower still running. He communicated by nods and whispers. His mentation improved by the time he came to the ED and he appeared more alert. He denies chest pain or headaches. No obvious visible trauma.  He does not have any history of orthopnea, paroxysmal nocturnal dyspnea or leg edema. He does not have palpitations or shortness of breath. He does struggle with aspirating secretions and gets choked frequently.  He had a negative cardiac work-up back in 2016 when he had presented with pleuritic chest discomfort.   Echocardiogram 07/30/2014: - Left ventricle: The cavity size was normal. Wall thickness was   increased in a pattern of severe LVH. Systolic function was   normal. The estimated ejection fraction was in the range of 60%   to 65%. Although no diagnostic regional wall motion abnormality   was identified, this possibility cannot be completely excluded on   the basis of this study. Doppler parameters are consistent with   abnormal left ventricular relaxation (grade 1 diastolic   dysfunction). The E/e&' ratio is between 8-15, suggesting   indeterminate LV filling pressure. - Aortic valve: Trileaflet. Sclerosis without stenosis. There was   trivial regurgitation. - Aorta: Ascending aortic diameter: 43 mm (S). - Ascending aorta: The ascending aorta is dilated. - Mitral valve: Mild leafelet tip  sclerosis. Trivial regurgitation. - Left atrium: Moderately dilated at 47 ml/m2. Impressions: - LVEF 60-65%, severe LVH, diastolic dysfunction, indeterminate LV   filling pressure, dilated ascending aorta to 4.3 cm, trivial to   mild AI, trivial MR, moderately dilated LA.   CT Angio Chest 01/22/2017 IMPRESSION: 1. Negative for acute pulmonary embolus or aortic dissection 2. Stable ascending aortic aneurysm measuring up to 4.3 cm. 3. Stable 5 mm pulmonary nodule in the lingula, felt benign given lack of interval change 4. Gallstones   Home Medications Prior to Admission medications   Medication Sig Start Date End Date Taking? Authorizing Provider  aspirin EC 81 MG tablet Take 81 mg by mouth daily.   Yes [provider]  benazepril (LOTENSIN) 5 MG tablet TAKE 1 TABLET BY MOUTH  DAILY Patient taking differently: Take 5 mg by mouth daily.  10/18/17  Yes Reed, Tiffany L, DO  Calcium Carbonate-Vitamin D (CALCIUM-CARB 600 + D) 600-125 MG-UNIT TABS Take 1 tablet by mouth 2 (two) times daily.    Yes [provider]  Calcium Polycarbophil (FIBERCON PO) Take 4 tablets by mouth at bedtime.   Yes [provider]  carbidopa-levodopa (SINEMET IR) 25-100 MG tablet Take 3 tablets by mouth 2 (two) times daily. Patient taking differently: Take 3 tablets by mouth See admin instructions. Take 3 tablets by mouth daily at noon and at 4pm 05/31/17  Yes Reed, Tiffany L, DO  cyanocobalamin (,VITAMIN B-12,) 1000 MCG/ML injection Inject 1 mL (1,000 mcg total) into the muscle every 30 (thirty) days. Pt receives on the 15th of every month. Patient taking differently: Inject 1,000 mcg  into the muscle every 30 (thirty) days. On or about the 15th of each month 03/31/16  Yes Kimber Relic, MD  diclofenac sodium (VOLTAREN) 1 % GEL Apply 4 g topically 4 (four) times daily. To affected knee Patient taking differently: Apply 4 g topically 4 (four) times daily as needed (knee pain).  03/24/17  Yes  Reed, Tiffany L, DO  gabapentin (NEURONTIN) 300 MG capsule TAKE 1 CAPSULE BY MOUTH AT  BEDTIME Patient taking differently: Take 300 mg by mouth at bedtime.  10/18/17  Yes Reed, Tiffany L, DO  ibuprofen (ADVIL,MOTRIN) 200 MG tablet Take 200 mg by mouth daily as needed (pain).   Yes [provider]  metFORMIN (GLUCOPHAGE) 1000 MG tablet TAKE 1 TABLET BY MOUTH TWO  TIMES DAILY WITH MEALS Patient taking differently: Take 1,000 mg by mouth 2 (two) times daily with a meal.  07/16/17  Yes Reed, Tiffany L, DO  mirtazapine (REMERON) 30 MG tablet TAKE 1 TABLET BY MOUTH AT  BEDTIME Patient taking differently: Take 30 mg by mouth at bedtime.  07/16/17  Yes Reed, Tiffany L, DO  mupirocin ointment (BACTROBAN) 2 % APPLY  OINTMENT TOPICALLY AT BEDTIME TO  LESION  ON  SCROTUM Patient taking differently: Apply 1 application topically See admin instructions. Apply ointment topically at bedtime to lesion on scrotum 10/20/17  Yes Reed, Tiffany L, DO  naproxen sodium (ALEVE) 220 MG tablet Take 220 mg by mouth daily as needed (pain).   Yes [provider]  OVER THE COUNTER MEDICATION Take 1 capsule by mouth at bedtime. CBD oil   Yes [provider]  pantoprazole (PROTONIX) 40 MG tablet TAKE 1 TABLET BY MOUTH TWO  TIMES DAILY Patient taking differently: Take 40 mg by mouth 2 (two) times daily.  07/16/17  Yes Reed, Tiffany L, DO  Polyethyl Glycol-Propyl Glycol (SYSTANE OP) Place 1 drop into both eyes 2 (two) times daily.   Yes [provider]  pravastatin (PRAVACHOL) 20 MG tablet TAKE 1 TABLET BY MOUTH AT  BEDTIME Patient taking differently: Take 20 mg by mouth at bedtime.  07/16/17  Yes Reed, Tiffany L, DO  pyridOXINE (VITAMIN B-6) 100 MG tablet Take 100 mg by mouth daily.   Yes [provider]  sodium phosphate (FLEET) 7-19 GM/118ML ENEM Place 1 enema rectally once a week.   Yes [provider]  tamsulosin (FLOMAX) 0.4 MG CAPS capsule TAKE 1 CAPSULE BY MOUTH AT   BEDTIME Patient taking differently: Take 0.4 mg by mouth at bedtime.  10/18/17  Yes Reed, Tiffany L, DO  glucose blood (ONE TOUCH ULTRA TEST) test strip 1 each by Other route 3 (three) times daily. Dx: E11.43 10/21/17   Reed, Tiffany L, DO  ONETOUCH DELICA LANCETS FINE MISC Use to check blood glucose three times daily. Dx: E11.43 10/21/17   [name], DO    Past Medical History: Past Medical History:  Diagnosis Date  . Abnormality of gait   . Arthritis   . Bifascicular block   . Cyanocobalamin deficiency   . Depression   . Diverticulitis   . Dizzy   . Dysphagia, idiopathic    History of aspiration  . Gait disturbance   . Hearing loss of both ears   . Hyperlipidemia   . Hypertension   . Lumbar back pain   . Memory loss   . Neck pain   . Osteoarthritis   . Osteoporosis   . Other and unspecified hyperlipidemia   . Parkinsonism (HCC)   .  Recurrent falls    Using a cane and walker  . Restless leg   . Restless legs syndrome (RLS)   . Right knee pain   . Shoulder pain   . Thoracic back pain   . Thyroid disease   . Type 2 diabetes mellitus with autonomic neuropathy (HCC)   . Unspecified essential hypertension   . Unspecified hypothyroidism   . Vitamin D deficiency     Past Surgical History: Past Surgical History:  Procedure Laterality Date  . CATARACT EXTRACTION Bilateral 2012   Dr. Hazle Quant    Family History: Family History  Problem Relation Age of Onset  . Heart disease Mother   . Diabetes Mother   . Stroke Mother   . Cancer Father        prostate  . Diabetes Sister   . Heart disease Brother     Social History: Social History   Socioeconomic History  . Marital status: Married    Spouse name: Kathie Rhodes  . Number of children: 3  . Years of education: College  . Highest education level: Not on file  Occupational History  . Occupation: Retired    Associate Professor: RETIRED    Comment: Manager/ [employer]  . Financial resource strain: Not hard at  all  . Food insecurity:    Worry: Never true    Inability: Never true  . Transportation needs:    Medical: No    Non-medical: No  Tobacco Use  . Smoking status: Former Smoker    Last attempt to quit: 01/31/1974    Years since quitting: 43.8  . Smokeless tobacco: Never Used  Substance and Sexual Activity  . Alcohol use: No    Alcohol/week: 0.0 standard drinks  . Drug use: No  . Sexual activity: Never  Lifestyle  . Physical activity:    Days per week: 0 days    Minutes per session: 0 min  . Stress: To some extent  Relationships  . Social connections:    Talks on phone: More than three times a week    Gets together: More than three times a week    Attends religious service: Never    Active member of club or organization: No    Attends meetings of clubs or organizations: Never    Relationship status: Married  Other Topics Concern  . Not on file  Social History Narrative   Married 1956. Returned from a Production designer, theatre/television/film for [employer].   Patient lives at home with his family.   Caffeine Use: none   Patient has a living will and healthcare power of attorney.    Allergies:  Allergies  Allergen Reactions  . Caffeine Swelling and Other (See Comments)    Reaction:  Joint swelling     Review of Systems: [y] = yes, [ ]  = no   . General: Weight gain [ ] ; Weight loss [y - 10lbs]; Anorexia [ ] ; Fatigue [ ] ; Fever [ ] ; Chills [ ] ; Weakness [y]  . Cardiac: Chest pain/pressure [ ] ; Resting SOB [ ] ; Exertional SOB [ ] ; Orthopnea [ ] ; Pedal Edema [ ] ; Palpitations [ ] ; Syncope [ ] ; Presyncope [ ] ; Paroxysmal nocturnal dyspnea[ ]   . Pulmonary: Cough [y; Wheezing[ ] ; Hemoptysis[ ] ; Sputum [ ] ; Snoring [ ]   . GI: Vomiting[ ] ; Dysphagia[ ] ; Melena[ ] ; Hematochezia [ ] ; Heartburn[ ] ; Abdominal pain [ ] ; Constipation [y]; Diarrhea [ ] ; BRBPR [ ]   . GU: Hematuria[ ] ; Dysuria [ ] ; Nocturia[ ]   .  Vascular: Pain in legs with walking [ ] ; Pain in feet with lying flat [ ] ; Non-healing sores [ ] ; Stroke [ ] ; TIA  [ ] ; Slurred speech [ ] ;  . Neuro: Headaches[ ] ; Vertigo[ ] ; Seizures[ ] ; Paresthesias[ ] ;Blurred vision [ ] ; Diplopia [ ] ; Vision changes [ ]   . Ortho/Skin: Arthritis [y]; Joint pain [y]; Muscle pain [y]; Joint swelling [ ] ; Back Pain [ ] ; Rash [ ]   . Psych: Depression[y]; Anxiety[ ]   . Heme: Bleeding problems [ ] ; Clotting disorders [ ] ; Anemia [ ]   . Endocrine: Diabetes [ ] ; Thyroid dysfunction[ ]      Objective:    Vital Signs:   Temp:  [97.6 F (36.4 C)] 97.6 F (36.4 C) (09/29 1528) Pulse Rate:  [39-93] 74 (09/29 2030) Resp:  [16-27] 16 (09/29 2030) BP: (145-180)/(92-103) 173/92 (09/29 2030) SpO2:  [90 %-98 %] 98 % (09/29 2030)    Weight change: There were no vitals filed for this visit.  Intake/Output:  No intake or output data in the 24 hours ending 11/21/17 2109    Physical Exam    General:  Well appearing, poor historian HEENT: normal Neck: supple. JVP . Carotids 2+ bilat; no bruits. No lymphadenopathy or thyromegaly appreciated. Cor: PMI nondisplaced. Regular rate & rhythm. No rubs, gallops or murmurs. Lungs: clear Abdomen: soft, nontender, nondistended. No hepatosplenomegaly. No bruits or masses. Extremities: no cyanosis, clubbing, rash, edema Neuro: alert ,moves all 4 extremities w/o difficulty    EKG    Sinus rhythm, rate 88 bpm, bigeminy, RBBB, LVH  Labs   Basic Metabolic Panel: Recent Labs  Lab 11/21/17 1648  NA 142  K 3.8  CL 105  CO2 24  GLUCOSE 132*  BUN 21  CREATININE 1.10  CALCIUM 9.9    Liver Function Tests: Recent Labs  Lab 11/21/17 1648  AST 27  ALT 15  ALKPHOS 63  BILITOT 1.1  PROT 7.1  ALBUMIN 3.9   No results for input(s): LIPASE, AMYLASE in the last 168 hours. No results for input(s): AMMONIA in the last 168 hours.  CBC: Recent Labs  Lab 11/21/17 1648  WBC 16.5*  NEUTROABS 15.1*  HGB 16.0  HCT 48.0  MCV 95.0  PLT 281    Cardiac Enzymes: Recent Labs  Lab 11/21/17 1648  TROPONINI 0.04*    BNP: BNP  (last 3 results) Recent Labs    01/22/17 1720  BNP 216.2*    ProBNP (last 3 results) No results for input(s): PROBNP in the last 8760 hours.   CBG: No results for input(s): GLUCAP in the last 168 hours.  Coagulation Studies: No results for input(s): LABPROT, INR in the last 72 hours.   Imaging   Ct Head Wo Contrast  Result Date: 11/21/2017 CLINICAL DATA:  Syncope. EXAM: CT HEAD WITHOUT CONTRAST CT CERVICAL SPINE WITHOUT CONTRAST TECHNIQUE: Multidetector CT imaging of the head and cervical spine was performed following the standard protocol without intravenous contrast. Multiplanar CT image reconstructions of the cervical spine were also generated. COMPARISON:  None. FINDINGS: CT HEAD FINDINGS Brain: Moderate diffuse cortical atrophy is noted. Mild chronic ischemic white matter disease is noted. No mass effect or midline shift is noted. Ventricular size is within normal limits. There is no evidence of mass lesion, hemorrhage or acute infarction. Vascular: No hyperdense vessel or unexpected calcification. Skull: Normal. Negative for fracture or focal lesion. Sinuses/Orbits: No acute finding. Other: None. CT CERVICAL SPINE FINDINGS Alignment: Normal. Skull base and vertebrae: No acute fracture. No primary bone lesion  or focal pathologic process. Soft tissues and spinal canal: No prevertebral fluid or swelling. No visible canal hematoma. Disc levels: Moderate degenerative disc disease is noted at C6-7 and C7-T1. Upper chest: Negative. Other: Degenerative changes are seen involving posterior facet joints bilaterally. IMPRESSION: Moderate diffuse cortical atrophy. Mild chronic ischemic white matter disease. No acute intracranial abnormality seen. Multilevel degenerative disc disease. No acute abnormality seen in the cervical spine. Electronically Signed   By: Lupita Raider, M.D.   On: 11/21/2017 18:32   Ct Cervical Spine Wo Contrast  Result Date: 11/21/2017 CLINICAL DATA:  Syncope. EXAM: CT  HEAD WITHOUT CONTRAST CT CERVICAL SPINE WITHOUT CONTRAST TECHNIQUE: Multidetector CT imaging of the head and cervical spine was performed following the standard protocol without intravenous contrast. Multiplanar CT image reconstructions of the cervical spine were also generated. COMPARISON:  None. FINDINGS: CT HEAD FINDINGS Brain: Moderate diffuse cortical atrophy is noted. Mild chronic ischemic white matter disease is noted. No mass effect or midline shift is noted. Ventricular size is within normal limits. There is no evidence of mass lesion, hemorrhage or acute infarction. Vascular: No hyperdense vessel or unexpected calcification. Skull: Normal. Negative for fracture or focal lesion. Sinuses/Orbits: No acute finding. Other: None. CT CERVICAL SPINE FINDINGS Alignment: Normal. Skull base and vertebrae: No acute fracture. No primary bone lesion or focal pathologic process. Soft tissues and spinal canal: No prevertebral fluid or swelling. No visible canal hematoma. Disc levels: Moderate degenerative disc disease is noted at C6-7 and C7-T1. Upper chest: Negative. Other: Degenerative changes are seen involving posterior facet joints bilaterally. IMPRESSION: Moderate diffuse cortical atrophy. Mild chronic ischemic white matter disease. No acute intracranial abnormality seen. Multilevel degenerative disc disease. No acute abnormality seen in the cervical spine. Electronically Signed   By: Lupita Raider, M.D.   On: 11/21/2017 18:32   Dg Pelvis Portable  Result Date: 11/21/2017 CLINICAL DATA:  Patient found down. EXAM: PORTABLE PELVIS 1-2 VIEWS COMPARISON:  None. FINDINGS: There is no evidence of pelvic fracture or diastasis. No pelvic bone lesions are seen. IMPRESSION: Negative. Electronically Signed   By: Gerome Sam III M.D   On: 11/21/2017 16:32   Dg Chest Portable 1 View  Result Date: 11/21/2017 CLINICAL DATA:  Change in mental status EXAM: PORTABLE CHEST 1 VIEW COMPARISON:  June 21, 2017 FINDINGS:  Subtle haziness over the right upper lung is thought to be artifactual. Stable tortuous thoracic aorta. The heart, hila, mediastinum, lungs, and pleura are otherwise normal. IMPRESSION: Subtle haziness over the right apex is favored to be artifactual rather than subtle infiltrate. A PA and lateral chest x-ray could further evaluate if clinically warranted. Electronically Signed   By: Gerome Sam III M.D   On: 11/21/2017 16:31   Dg Foot Complete Left  Result Date: 11/21/2017 CLINICAL DATA:  Patient found on floor bathroom. EXAM: LEFT FOOT - COMPLETE 3+ VIEW COMPARISON:  None. FINDINGS: Vascular calcifications.  No fractures identified. IMPRESSION: Negative. Electronically Signed   By: Gerome Sam III M.D   On: 11/21/2017 16:32      Medications:     Current Medications: . [START ON 11/22/2017] aspirin EC  81 mg Oral Daily  . aspirin  300 mg Rectal Once  . benazepril  5 mg Oral Daily  . Calcium Carbonate-Vitamin D  1 tablet Oral BID  . carbidopa-levodopa  3 tablet Oral See admin instructions  . enoxaparin (LOVENOX) injection  40 mg Subcutaneous Q24H  . gabapentin  300 mg Oral QHS  . mirtazapine  30 mg Oral QHS  . pantoprazole  40 mg Oral BID  . polycarbophil  625 mg Oral QHS  . pravastatin  20 mg Oral QHS  . pyridOXINE  100 mg Oral Daily  . tamsulosin  0.4 mg Oral QHS     Infusions: . sodium chloride         Assessment/Plan   1. Elevated Troponin  - Trend cardiac biomarkers and obtain serial ECGs - Continue aspirin 81 mg daily - Continue Pravastatin. Check a lipid panel in the morning. - Consider intravenous unfractionated heparin per pharmacy protocol if patient complains of chest pain or Troponin trends significantly higher. -  Obtain a transthoracic echocardiogram to evaluate LV systolic and diastolic function.    2. Syncope  - Admit to telemetry (patient will also benefit from an outpatient cardiac monitor as well) - Etiology is likely to be multifactorial  (with an appreciable contribution from generalized frailty and Parkinsonism.    Lonie Peak, MD  11/21/2017, 9:09 PM  Cardiology Overnight Team Please contact [facility] for night-coverage after hours (4p -7a ) and weekends on [email or web address]

## 2017-11-21 NOTE — ED Notes (Addendum)
Pt's family stated that he has been choking when they give him water.  RN explained that he is NPO and requested that they please not give him anymore to drink until we can have him evaluated.  RN explained the risks, family agreed.

## 2017-11-21 NOTE — ED Triage Notes (Addendum)
Per EMS- the pt was last seen at his baseline this morning around 0900. She got home and found him on the floor in the bathroom, wet with shower on. The pt was warm to touch and red all over. Pt is not speaking currently. Has parkinsons, EMS states that the wife said normally he will "whisper" his answers to you. States he was hypertensive. No obvious deformities. Wife is in route for better explanation of baseline. Not on blood thinners.  Upon assessment, the pt is able to follow commands and answers yes or no and can whisper some answers. He does state he passed out in the shower, he denies pain anywhere.

## 2017-11-21 NOTE — H&P (Addendum)
History and Physical    Nicholas Barnett:[id number] DOB: 1931/10/13 DOA: 11/21/2017  PCP: [name], DO   Patient coming from: Home.  History was obtained from patient patient's wife and patient's daughter and previous records.  Chief Complaint: Loss of consciousness.  HPI: Nicholas Barnett is a 84 y.o. male with history of Parkinson's disease, gait difficulty, hypertension, diabetes mellitus, dysphagia, B12 deficiency, bifascicular block was brought to the ER after patient had a unwitnessed syncopal episode at home.  Family found patient lying on the floor and was brought to the ER.  Patient does not recall the event.  Did not have any incontinence of urine or tongue bite.  Did not have any chest pain or shortness of breath nausea vomiting or diarrhea.  No change in his medications recently.  ED Course: In the ER EKG shows normal sinus rhythm with nonspecific changes with mildly elevated troponin.  Cardiology was consulted at this time they requested trending cardiac markers and checking 2D echo.  CT head C-spine was unremarkable.  Patient's family states that patient is difficulty swallowing is worsened.  Swallow evaluation has been requested.  Chest x-ray shows possible infiltrates.  Patient admitted for further management of syncope, elevated troponin and dysphagia with possible aspiration.  Review of Systems: As per HPI, rest all negative.   Past Medical History:  Diagnosis Date  . Abnormality of gait   . Arthritis   . Bifascicular block   . Cyanocobalamin deficiency   . Depression   . Diverticulitis   . Dizzy   . Dysphagia, idiopathic    History of aspiration  . Gait disturbance   . Hearing loss of both ears   . Hyperlipidemia   . Hypertension   . Lumbar back pain   . Memory loss   . Neck pain   . Osteoarthritis   . Osteoporosis   . Other and unspecified hyperlipidemia   . Parkinsonism (HCC)   . Recurrent falls    Using a cane and walker  . Restless leg   .  Restless legs syndrome (RLS)   . Right knee pain   . Shoulder pain   . Thoracic back pain   . Thyroid disease   . Type 2 diabetes mellitus with autonomic neuropathy (HCC)   . Unspecified essential hypertension   . Unspecified hypothyroidism   . Vitamin D deficiency     Past Surgical History:  Procedure Laterality Date  . CATARACT EXTRACTION Bilateral 2012   Dr. Hazle Quant     reports that he quit smoking about 43 years ago. He has never used smokeless tobacco. He reports that he does not drink alcohol or use drugs.  Allergies  Allergen Reactions  . Caffeine Swelling and Other (See Comments)    Reaction:  Joint swelling    Family History  Problem Relation Age of Onset  . Heart disease Mother   . Diabetes Mother   . Stroke Mother   . Cancer Father        prostate  . Diabetes Sister   . Heart disease Brother     Prior to Admission medications   Medication Sig Start Date End Date Taking? Authorizing Provider  aspirin EC 81 MG tablet Take 81 mg by mouth daily.   Yes [provider]  benazepril (LOTENSIN) 5 MG tablet TAKE 1 TABLET BY MOUTH  DAILY Patient taking differently: Take 5 mg by mouth daily.  10/18/17  Yes Reed, Tiffany L, DO  Calcium Carbonate-Vitamin D (  CALCIUM-CARB 600 + D) 600-125 MG-UNIT TABS Take 1 tablet by mouth 2 (two) times daily.    Yes [provider]  Calcium Polycarbophil (FIBERCON PO) Take 4 tablets by mouth at bedtime.   Yes [provider]  carbidopa-levodopa (SINEMET IR) 25-100 MG tablet Take 3 tablets by mouth 2 (two) times daily. Patient taking differently: Take 3 tablets by mouth See admin instructions. Take 3 tablets by mouth daily at noon and at 4pm 05/31/17  Yes Reed, Tiffany L, DO  cyanocobalamin (,VITAMIN B-12,) 1000 MCG/ML injection Inject 1 mL (1,000 mcg total) into the muscle every 30 (thirty) days. Pt receives on the 15th of every month. Patient taking differently: Inject 1,000 mcg into the muscle every 30 (thirty) days.  On or about the 15th of each month 03/31/16  Yes Kimber Relic, MD  diclofenac sodium (VOLTAREN) 1 % GEL Apply 4 g topically 4 (four) times daily. To affected knee Patient taking differently: Apply 4 g topically 4 (four) times daily as needed (knee pain).  03/24/17  Yes Reed, Tiffany L, DO  gabapentin (NEURONTIN) 300 MG capsule TAKE 1 CAPSULE BY MOUTH AT  BEDTIME Patient taking differently: Take 300 mg by mouth at bedtime.  10/18/17  Yes Reed, Tiffany L, DO  ibuprofen (ADVIL,MOTRIN) 200 MG tablet Take 200 mg by mouth daily as needed (pain).   Yes [provider]  metFORMIN (GLUCOPHAGE) 1000 MG tablet TAKE 1 TABLET BY MOUTH TWO  TIMES DAILY WITH MEALS Patient taking differently: Take 1,000 mg by mouth 2 (two) times daily with a meal.  07/16/17  Yes Reed, Tiffany L, DO  mirtazapine (REMERON) 30 MG tablet TAKE 1 TABLET BY MOUTH AT  BEDTIME Patient taking differently: Take 30 mg by mouth at bedtime.  07/16/17  Yes Reed, Tiffany L, DO  mupirocin ointment (BACTROBAN) 2 % APPLY  OINTMENT TOPICALLY AT BEDTIME TO  LESION  ON  SCROTUM Patient taking differently: Apply 1 application topically See admin instructions. Apply ointment topically at bedtime to lesion on scrotum 10/20/17  Yes Reed, Tiffany L, DO  naproxen sodium (ALEVE) 220 MG tablet Take 220 mg by mouth daily as needed (pain).   Yes [provider]  OVER THE COUNTER MEDICATION Take 1 capsule by mouth at bedtime. CBD oil   Yes [provider]  pantoprazole (PROTONIX) 40 MG tablet TAKE 1 TABLET BY MOUTH TWO  TIMES DAILY Patient taking differently: Take 40 mg by mouth 2 (two) times daily.  07/16/17  Yes Reed, Tiffany L, DO  Polyethyl Glycol-Propyl Glycol (SYSTANE OP) Place 1 drop into both eyes 2 (two) times daily.   Yes [provider]  pravastatin (PRAVACHOL) 20 MG tablet TAKE 1 TABLET BY MOUTH AT  BEDTIME Patient taking differently: Take 20 mg by mouth at bedtime.  07/16/17  Yes Reed, Tiffany L, DO  pyridOXINE (VITAMIN  B-6) 100 MG tablet Take 100 mg by mouth daily.   Yes [provider]  sodium phosphate (FLEET) 7-19 GM/118ML ENEM Place 1 enema rectally once a week.   Yes [provider]  tamsulosin (FLOMAX) 0.4 MG CAPS capsule TAKE 1 CAPSULE BY MOUTH AT  BEDTIME Patient taking differently: Take 0.4 mg by mouth at bedtime.  10/18/17  Yes Reed, Tiffany L, DO  glucose blood (ONE TOUCH ULTRA TEST) test strip 1 each by Other route 3 (three) times daily. Dx: E11.43 10/21/17   Reed, Tiffany L, DO  ONETOUCH DELICA LANCETS FINE MISC Use to check blood glucose three times daily. Dx:  E11.43 10/21/17   [name], DO    Physical Exam: Vitals:   11/21/17 1830 11/21/17 1845 11/21/17 1945 11/21/17 2030  BP: (!) 156/97 (!) 145/96 (!) 163/103 (!) 173/92  Pulse: 73 71 74 74  Resp: (!) 23 (!) 23 (!) 22 16  Temp:      TempSrc:      SpO2: 97% 97% 98% 98%      Constitutional: Moderately built and nourished. Vitals:   11/21/17 1830 11/21/17 1845 11/21/17 1945 11/21/17 2030  BP: (!) 156/97 (!) 145/96 (!) 163/103 (!) 173/92  Pulse: 73 71 74 74  Resp: (!) 23 (!) 23 (!) 22 16  Temp:      TempSrc:      SpO2: 97% 97% 98% 98%   Eyes: Anicteric no pallor. ENMT: No discharge from the ears eyes nose or mouth. Neck: No mass felt.  No neck rigidity. Respiratory: No rhonchi or crepitations. Cardiovascular: S1-S2 heard no murmurs appreciated. Abdomen: Soft nontender bowel sounds present. Musculoskeletal: No edema.  No joint effusion. Skin: No rash.  Skin appears warm. Neurologic: Alert awake oriented to place and person moves all extremities 5 x 5.  No facial asymmetry tongue is midline. Psychiatric: Appears normal per normal affect.   Labs on Admission: I have personally reviewed following labs and imaging studies  CBC: Recent Labs  Lab 11/21/17 1648  WBC 16.5*  NEUTROABS 15.1*  HGB 16.0  HCT 48.0  MCV 95.0  PLT 281   Basic Metabolic Panel: Recent Labs  Lab 11/21/17 1648  NA 142  K 3.8   CL 105  CO2 24  GLUCOSE 132*  BUN 21  CREATININE 1.10  CALCIUM 9.9   GFR: CrCl cannot be calculated (Unknown ideal weight.). Liver Function Tests: Recent Labs  Lab 11/21/17 1648  AST 27  ALT 15  ALKPHOS 63  BILITOT 1.1  PROT 7.1  ALBUMIN 3.9   No results for input(s): LIPASE, AMYLASE in the last 168 hours. No results for input(s): AMMONIA in the last 168 hours. Coagulation Profile: No results for input(s): INR, PROTIME in the last 168 hours. Cardiac Enzymes: Recent Labs  Lab 11/21/17 1648  TROPONINI 0.04*   BNP (last 3 results) No results for input(s): PROBNP in the last 8760 hours. HbA1C: No results for input(s): HGBA1C in the last 72 hours. CBG: No results for input(s): GLUCAP in the last 168 hours. Lipid Profile: No results for input(s): CHOL, HDL, LDLCALC, TRIG, CHOLHDL, LDLDIRECT in the last 72 hours. Thyroid Function Tests: No results for input(s): TSH, T4TOTAL, FREET4, T3FREE, THYROIDAB in the last 72 hours. Anemia Panel: No results for input(s): VITAMINB12, FOLATE, FERRITIN, TIBC, IRON, RETICCTPCT in the last 72 hours. Urine analysis:    Component Value Date/Time   COLORURINE DARK YELLOW 06/21/2017 1545   APPEARANCEUR CLOUDY (A) 06/21/2017 1545   APPEARANCEUR Cloudy (A) 08/21/2013 1345   LABSPEC 1.024 06/21/2017 1545   PHURINE < OR = 5.0 06/21/2017 1545   GLUCOSEU NEGATIVE 06/21/2017 1545   HGBUR NEGATIVE 06/21/2017 1545   HGBUR 1+ 03/21/2009 1105   BILIRUBINUR NEGATIVE 01/22/2017 2137   BILIRUBINUR small (A) 12/08/2016 1508   BILIRUBINUR Large 10/09/2014 1549   BILIRUBINUR Negative 08/21/2013 1345   KETONESUR TRACE (A) 06/21/2017 1545   PROTEINUR TRACE (A) 06/21/2017 1545   UROBILINOGEN 0.2 12/08/2016 1508   UROBILINOGEN 1.0 11/04/2012 1458   NITRITE NEGATIVE 06/21/2017 1545   LEUKOCYTESUR 3+ (A) 06/21/2017 1545   LEUKOCYTESUR 1+ (A) 08/21/2013 1345   Sepsis Labs: @LABRCNTIP (procalcitonin:4,lacticidven:4) )  No results found for this or any  previous visit (from the past 240 hour(s)).   Radiological Exams on Admission: Ct Head Wo Contrast  Result Date: 11/21/2017 CLINICAL DATA:  Syncope. EXAM: CT HEAD WITHOUT CONTRAST CT CERVICAL SPINE WITHOUT CONTRAST TECHNIQUE: Multidetector CT imaging of the head and cervical spine was performed following the standard protocol without intravenous contrast. Multiplanar CT image reconstructions of the cervical spine were also generated. COMPARISON:  None. FINDINGS: CT HEAD FINDINGS Brain: Moderate diffuse cortical atrophy is noted. Mild chronic ischemic white matter disease is noted. No mass effect or midline shift is noted. Ventricular size is within normal limits. There is no evidence of mass lesion, hemorrhage or acute infarction. Vascular: No hyperdense vessel or unexpected calcification. Skull: Normal. Negative for fracture or focal lesion. Sinuses/Orbits: No acute finding. Other: None. CT CERVICAL SPINE FINDINGS Alignment: Normal. Skull base and vertebrae: No acute fracture. No primary bone lesion or focal pathologic process. Soft tissues and spinal canal: No prevertebral fluid or swelling. No visible canal hematoma. Disc levels: Moderate degenerative disc disease is noted at C6-7 and C7-T1. Upper chest: Negative. Other: Degenerative changes are seen involving posterior facet joints bilaterally. IMPRESSION: Moderate diffuse cortical atrophy. Mild chronic ischemic white matter disease. No acute intracranial abnormality seen. Multilevel degenerative disc disease. No acute abnormality seen in the cervical spine. Electronically Signed   By: Lupita Raider, M.D.   On: 11/21/2017 18:32   Ct Cervical Spine Wo Contrast  Result Date: 11/21/2017 CLINICAL DATA:  Syncope. EXAM: CT HEAD WITHOUT CONTRAST CT CERVICAL SPINE WITHOUT CONTRAST TECHNIQUE: Multidetector CT imaging of the head and cervical spine was performed following the standard protocol without intravenous contrast. Multiplanar CT image reconstructions  of the cervical spine were also generated. COMPARISON:  None. FINDINGS: CT HEAD FINDINGS Brain: Moderate diffuse cortical atrophy is noted. Mild chronic ischemic white matter disease is noted. No mass effect or midline shift is noted. Ventricular size is within normal limits. There is no evidence of mass lesion, hemorrhage or acute infarction. Vascular: No hyperdense vessel or unexpected calcification. Skull: Normal. Negative for fracture or focal lesion. Sinuses/Orbits: No acute finding. Other: None. CT CERVICAL SPINE FINDINGS Alignment: Normal. Skull base and vertebrae: No acute fracture. No primary bone lesion or focal pathologic process. Soft tissues and spinal canal: No prevertebral fluid or swelling. No visible canal hematoma. Disc levels: Moderate degenerative disc disease is noted at C6-7 and C7-T1. Upper chest: Negative. Other: Degenerative changes are seen involving posterior facet joints bilaterally. IMPRESSION: Moderate diffuse cortical atrophy. Mild chronic ischemic white matter disease. No acute intracranial abnormality seen. Multilevel degenerative disc disease. No acute abnormality seen in the cervical spine. Electronically Signed   By: Lupita Raider, M.D.   On: 11/21/2017 18:32   Dg Pelvis Portable  Result Date: 11/21/2017 CLINICAL DATA:  Patient found down. EXAM: PORTABLE PELVIS 1-2 VIEWS COMPARISON:  None. FINDINGS: There is no evidence of pelvic fracture or diastasis. No pelvic bone lesions are seen. IMPRESSION: Negative. Electronically Signed   By: Gerome Sam III M.D   On: 11/21/2017 16:32   Dg Chest Portable 1 View  Result Date: 11/21/2017 CLINICAL DATA:  Change in mental status EXAM: PORTABLE CHEST 1 VIEW COMPARISON:  June 21, 2017 FINDINGS: Subtle haziness over the right upper lung is thought to be artifactual. Stable tortuous thoracic aorta. The heart, hila, mediastinum, lungs, and pleura are otherwise normal. IMPRESSION: Subtle haziness over the right apex is favored to be  artifactual rather than subtle infiltrate. A  PA and lateral chest x-ray could further evaluate if clinically warranted. Electronically Signed   By: Gerome Sam III M.D   On: 11/21/2017 16:31   Dg Foot Complete Left  Result Date: 11/21/2017 CLINICAL DATA:  Patient found on floor bathroom. EXAM: LEFT FOOT - COMPLETE 3+ VIEW COMPARISON:  None. FINDINGS: Vascular calcifications.  No fractures identified. IMPRESSION: Negative. Electronically Signed   By: Gerome Sam III M.D   On: 11/21/2017 16:32    EKG: Independently reviewed.  Normal sinus rhythm with RBBB and LAFB.  Assessment/Plan Principal Problem:   Syncope Active Problems:   Essential hypertension   Parkinson's plus syndrome (HCC)   Restless legs syndrome (RLS)   Type II diabetes mellitus with renal manifestations (HCC)    1. Syncope -recent cardiology consult.  We will closely monitor telemetry check 2D echo cycle cardiac markers check orthostatic in the morning.  Since patient does not recall the event we will check EEG.  2. Elevated troponin -denies any chest pain.  Appreciate cardiology consult.  Will trend cardiac markers and check 2D echo. 3. Possible aspiration with history of dysphagia-kept patient on Unasyn.  We will get swallow evaluation.  Patient's family states that patient is swallowing difficulty and is acutely worsened for which we will check MRI of the brain. 4. Hypertension -if patient failed swallow and keep patient on PRN IV hydralazine and restart home medication once patient passes swallow. 5. History of Parkinson's disease on Sinemet which will be continued.  Patient failed swallow and cannot take medicine may need NG tube placement. 6. Diabetes mellitus type 2 we will keep patient on sliding scale coverage.   DVT prophylaxis: Lovenox. Code Status: DNR. Family Communication: Patient's wife and daughter. Disposition Plan: Home. Consults called: Cardiology and physical therapy. Admission status:  Observation.   Eduard Clos MD [facility] Pager [phone].  If 7PM-7AM, please contact night-coverage [email or web address] Password [email or web address]  11/21/2017, 9:09 PM

## 2017-11-21 NOTE — ED Notes (Deleted)
Attempted to call report to 2W x 2 no answer Pt in xray at this time

## 2017-11-21 NOTE — ED Provider Notes (Signed)
[facility] EMERGENCY DEPARTMENT Provider Note   CSN: [id number] Arrival date & time: 11/21/17  1512     History   Chief Complaint Chief Complaint  Patient presents with  . Fall  . Altered Mental Status    HPI Nicholas Barnett is a 84 y.o. male.  Patient with history of Parkinson's disease, high blood pressure, memory loss presents with EMS after he was found on the floor in the bathroom.  Patient was warm to touch and red all over at that time.  Patient communicates with nodding yes and no with whispers.  Unwitnessed event, patient says he does not recall details.  Report patient had syncopal episode in the shower.  Patient denies pain at this time.  Difficulty obtaining details due to minimal verbal and only head nods and difficulty hearing.     Past Medical History:  Diagnosis Date  . Abnormality of gait   . Arthritis   . Bifascicular block   . Cyanocobalamin deficiency   . Depression   . Diverticulitis   . Dizzy   . Dysphagia, idiopathic    History of aspiration  . Gait disturbance   . Hearing loss of both ears   . Hyperlipidemia   . Hypertension   . Lumbar back pain   . Memory loss   . Neck pain   . Osteoarthritis   . Osteoporosis   . Other and unspecified hyperlipidemia   . Parkinsonism (HCC)   . Recurrent falls    Using a cane and walker  . Restless leg   . Restless legs syndrome (RLS)   . Right knee pain   . Shoulder pain   . Thoracic back pain   . Thyroid disease   . Type 2 diabetes mellitus with autonomic neuropathy (HCC)   . Unspecified essential hypertension   . Unspecified hypothyroidism   . Vitamin D deficiency     Patient Active Problem List   Diagnosis Date Noted  . Goals of care, counseling/discussion   . Palliative care by specialist   . Sepsis (HCC) 01/22/2017  . Acute respiratory failure with hypoxia (HCC) 01/22/2017  . Type II diabetes mellitus with renal manifestations (HCC) 01/22/2017  . Acute renal failure  superimposed on stage 2 chronic kidney disease (HCC) 01/22/2017  . Aspiration pneumonia (HCC) 01/22/2017  . Fever, unspecified 10/16/2015  . Depression, major 10/16/2015  . GERD (gastroesophageal reflux disease) 04/17/2015  . UTI (urinary tract infection) 10/17/2014  . Pain in the chest   . Chest pain 07/28/2014  . Abnormal EKG 07/28/2014  . Insomnia 10/17/2013  . Pain in joint, shoulder region 01/25/2013  . Dysphagia, idiopathic   . Recurrent falls   . Dizzy   . Hearing loss of both ears   . Neck pain   . Right knee pain   . Thoracic back pain   . Lumbar back pain   . Shoulder pain   . Cyanocobalamin deficiency   . Type 2 diabetes mellitus with autonomic neuropathy (HCC)   . Restless legs syndrome (RLS)   . Parkinson's plus syndrome (HCC) 11/24/2012  . Gait difficulty 11/24/2012  . NOCTURIA 05/08/2009  . Memory loss 04/11/2009  . ECZEMA 03/21/2009  . BPH (benign prostatic hyperplasia) 12/25/2008  . OSTEOARTHRITIS, KNEE, RIGHT 12/25/2008  . VITAMIN D DEFICIENCY 12/22/2007  . DIVERTICULITIS, HX OF 06/01/2007  . Hyperlipemia 12/16/2006  . ERECTILE DYSFUNCTION 12/16/2006  . Essential hypertension 12/16/2006    Past Surgical History:  Procedure Laterality Date  .  CATARACT EXTRACTION Bilateral 2012   Dr. Hazle Quant        Home Medications    Prior to Admission medications   Medication Sig Start Date End Date Taking? Authorizing Provider  aspirin EC 81 MG tablet Take 81 mg by mouth daily.   Yes [provider]  benazepril (LOTENSIN) 5 MG tablet TAKE 1 TABLET BY MOUTH  DAILY Patient taking differently: Take 5 mg by mouth daily.  10/18/17  Yes Reed, Tiffany L, DO  Calcium Carbonate-Vitamin D (CALCIUM-CARB 600 + D) 600-125 MG-UNIT TABS Take 1 tablet by mouth 2 (two) times daily.    Yes [provider]  Calcium Polycarbophil (FIBERCON PO) Take 4 tablets by mouth at bedtime.   Yes [provider]  carbidopa-levodopa (SINEMET IR) 25-100 MG tablet Take 3  tablets by mouth 2 (two) times daily. Patient taking differently: Take 3 tablets by mouth See admin instructions. Take 3 tablets by mouth daily at noon and at 4pm 05/31/17  Yes Reed, Tiffany L, DO  cyanocobalamin (,VITAMIN B-12,) 1000 MCG/ML injection Inject 1 mL (1,000 mcg total) into the muscle every 30 (thirty) days. Pt receives on the 15th of every month. Patient taking differently: Inject 1,000 mcg into the muscle every 30 (thirty) days. On or about the 15th of each month 03/31/16  Yes Kimber Relic, MD  diclofenac sodium (VOLTAREN) 1 % GEL Apply 4 g topically 4 (four) times daily. To affected knee Patient taking differently: Apply 4 g topically 4 (four) times daily as needed (knee pain).  03/24/17  Yes Reed, Tiffany L, DO  gabapentin (NEURONTIN) 300 MG capsule TAKE 1 CAPSULE BY MOUTH AT  BEDTIME Patient taking differently: Take 300 mg by mouth at bedtime.  10/18/17  Yes Reed, Tiffany L, DO  ibuprofen (ADVIL,MOTRIN) 200 MG tablet Take 200 mg by mouth daily as needed (pain).   Yes [provider]  metFORMIN (GLUCOPHAGE) 1000 MG tablet TAKE 1 TABLET BY MOUTH TWO  TIMES DAILY WITH MEALS Patient taking differently: Take 1,000 mg by mouth 2 (two) times daily with a meal.  07/16/17  Yes Reed, Tiffany L, DO  mirtazapine (REMERON) 30 MG tablet TAKE 1 TABLET BY MOUTH AT  BEDTIME Patient taking differently: Take 30 mg by mouth at bedtime.  07/16/17  Yes Reed, Tiffany L, DO  mupirocin ointment (BACTROBAN) 2 % APPLY  OINTMENT TOPICALLY AT BEDTIME TO  LESION  ON  SCROTUM Patient taking differently: Apply 1 application topically See admin instructions. Apply ointment topically at bedtime to lesion on scrotum 10/20/17  Yes Reed, Tiffany L, DO  naproxen sodium (ALEVE) 220 MG tablet Take 220 mg by mouth daily as needed (pain).   Yes [provider]  OVER THE COUNTER MEDICATION Take 1 capsule by mouth at bedtime. CBD oil   Yes [provider]  pantoprazole (PROTONIX) 40 MG tablet TAKE 1  TABLET BY MOUTH TWO  TIMES DAILY Patient taking differently: Take 40 mg by mouth 2 (two) times daily.  07/16/17  Yes Reed, Tiffany L, DO  Polyethyl Glycol-Propyl Glycol (SYSTANE OP) Place 1 drop into both eyes 2 (two) times daily.   Yes [provider]  pravastatin (PRAVACHOL) 20 MG tablet TAKE 1 TABLET BY MOUTH AT  BEDTIME Patient taking differently: Take 20 mg by mouth at bedtime.  07/16/17  Yes Reed, Tiffany L, DO  pyridOXINE (VITAMIN B-6) 100 MG tablet Take 100 mg by mouth daily.   Yes [provider]  sodium phosphate (FLEET) 7-19 GM/118ML ENEM Place  1 enema rectally once a week.   Yes [provider]  tamsulosin (FLOMAX) 0.4 MG CAPS capsule TAKE 1 CAPSULE BY MOUTH AT  BEDTIME Patient taking differently: Take 0.4 mg by mouth at bedtime.  10/18/17  Yes Reed, Tiffany L, DO  glucose blood (ONE TOUCH ULTRA TEST) test strip 1 each by Other route 3 (three) times daily. Dx: E11.43 10/21/17   Reed, Tiffany L, DO  ONETOUCH DELICA LANCETS FINE MISC Use to check blood glucose three times daily. Dx: E11.43 10/21/17   [name], DO    Family History Family History  Problem Relation Age of Onset  . Heart disease Mother   . Diabetes Mother   . Stroke Mother   . Cancer Father        prostate  . Diabetes Sister   . Heart disease Brother     Social History Social History   Tobacco Use  . Smoking status: Former Smoker    Last attempt to quit: 01/31/1974    Years since quitting: 43.8  . Smokeless tobacco: Never Used  Substance Use Topics  . Alcohol use: No    Alcohol/week: 0.0 standard drinks  . Drug use: No     Allergies   Caffeine   Review of Systems Review of Systems  Unable to perform ROS: Patient nonverbal     Physical Exam Updated Vital Signs BP (!) 163/103   Pulse 74   Temp 97.6 F (36.4 C) (Oral)   Resp (!) 22   SpO2 98%   Physical Exam  Constitutional: He appears well-developed and well-nourished.  HENT:  Head: Normocephalic and  atraumatic.  Congested  Eyes: Right eye exhibits no discharge. Left eye exhibits no discharge.  Neck: Normal range of motion. Neck supple. No tracheal deviation present.  Cardiovascular: Normal rate and regular rhythm.  Pulmonary/Chest: Effort normal.  Abdominal: Soft. He exhibits no distension. There is no tenderness. There is no guarding.  Musculoskeletal: He exhibits no edema.  Patient has no tenderness to flexion extension of major joints upper and lower extremity.  No midline cervical thoracic or lumbar tenderness, c-collar in place.  Neurological: He is alert. GCS eye subscore is 4. GCS verbal subscore is 5. GCS motor subscore is 6.  Pupils equal bilateral, patient follows commands and moves all extremities with general slowness however equal strength upper and lower extremity and can hold against gravity.  Sensation and palpation are intact bilateral.  Difficulty hearing.  Skin: Skin is warm. No rash noted.  Psychiatric: He has a normal mood and affect.  Nursing note and vitals reviewed.    ED Treatments / Results  Labs (all labs ordered are listed, but only abnormal results are displayed) Labs Reviewed  COMPREHENSIVE METABOLIC PANEL - Abnormal; Notable for the following components:      Result Value   Glucose, Bld 132 (*)    GFR calc non Af Amer 60 (*)    All other components within normal limits  CBC WITH DIFFERENTIAL/PLATELET - Abnormal; Notable for the following components:   WBC 16.5 (*)    Neutro Abs 15.1 (*)    All other components within normal limits  TROPONIN I - Abnormal; Notable for the following components:   Troponin I 0.04 (*)    All other components within normal limits  I-STAT TROPONIN, ED - Abnormal; Notable for the following components:   Troponin i, poc 0.14 (*)    All other components within normal limits  URINE CULTURE  URINALYSIS, ROUTINE W  REFLEX MICROSCOPIC  TSH  I-STAT TROPONIN, ED    EKG EKG Interpretation  Date/Time:  Sunday November 21 2017 15:19:59 EDT Ventricular Rate:  93 PR Interval:    QRS Duration: 155 QT Interval:  397 QTC Calculation: 494 R Axis:   -83 Text Interpretation:  Sinus rhythm Multiple premature complexes, vent & supraven RBBB and LAFB LVH by voltage Inferior elevation compared to previous, similar morphology  Confirmed by Blane Ohara [phone]) on 11/21/2017 3:41:13 PM Also confirmed by Blane Ohara [phone]), editor Barbette Hair [phone])  on 11/21/2017 4:24:08 PM   Radiology Ct Head Wo Contrast  Result Date: 11/21/2017 CLINICAL DATA:  Syncope. EXAM: CT HEAD WITHOUT CONTRAST CT CERVICAL SPINE WITHOUT CONTRAST TECHNIQUE: Multidetector CT imaging of the head and cervical spine was performed following the standard protocol without intravenous contrast. Multiplanar CT image reconstructions of the cervical spine were also generated. COMPARISON:  None. FINDINGS: CT HEAD FINDINGS Brain: Moderate diffuse cortical atrophy is noted. Mild chronic ischemic white matter disease is noted. No mass effect or midline shift is noted. Ventricular size is within normal limits. There is no evidence of mass lesion, hemorrhage or acute infarction. Vascular: No hyperdense vessel or unexpected calcification. Skull: Normal. Negative for fracture or focal lesion. Sinuses/Orbits: No acute finding. Other: None. CT CERVICAL SPINE FINDINGS Alignment: Normal. Skull base and vertebrae: No acute fracture. No primary bone lesion or focal pathologic process. Soft tissues and spinal canal: No prevertebral fluid or swelling. No visible canal hematoma. Disc levels: Moderate degenerative disc disease is noted at C6-7 and C7-T1. Upper chest: Negative. Other: Degenerative changes are seen involving posterior facet joints bilaterally. IMPRESSION: Moderate diffuse cortical atrophy. Mild chronic ischemic white matter disease. No acute intracranial abnormality seen. Multilevel degenerative disc disease. No acute abnormality seen in the cervical spine.  Electronically Signed   By: Lupita Raider, M.D.   On: 11/21/2017 18:32   Ct Cervical Spine Wo Contrast  Result Date: 11/21/2017 CLINICAL DATA:  Syncope. EXAM: CT HEAD WITHOUT CONTRAST CT CERVICAL SPINE WITHOUT CONTRAST TECHNIQUE: Multidetector CT imaging of the head and cervical spine was performed following the standard protocol without intravenous contrast. Multiplanar CT image reconstructions of the cervical spine were also generated. COMPARISON:  None. FINDINGS: CT HEAD FINDINGS Brain: Moderate diffuse cortical atrophy is noted. Mild chronic ischemic white matter disease is noted. No mass effect or midline shift is noted. Ventricular size is within normal limits. There is no evidence of mass lesion, hemorrhage or acute infarction. Vascular: No hyperdense vessel or unexpected calcification. Skull: Normal. Negative for fracture or focal lesion. Sinuses/Orbits: No acute finding. Other: None. CT CERVICAL SPINE FINDINGS Alignment: Normal. Skull base and vertebrae: No acute fracture. No primary bone lesion or focal pathologic process. Soft tissues and spinal canal: No prevertebral fluid or swelling. No visible canal hematoma. Disc levels: Moderate degenerative disc disease is noted at C6-7 and C7-T1. Upper chest: Negative. Other: Degenerative changes are seen involving posterior facet joints bilaterally. IMPRESSION: Moderate diffuse cortical atrophy. Mild chronic ischemic white matter disease. No acute intracranial abnormality seen. Multilevel degenerative disc disease. No acute abnormality seen in the cervical spine. Electronically Signed   By: Lupita Raider, M.D.   On: 11/21/2017 18:32   Dg Pelvis Portable  Result Date: 11/21/2017 CLINICAL DATA:  Patient found down. EXAM: PORTABLE PELVIS 1-2 VIEWS COMPARISON:  None. FINDINGS: There is no evidence of pelvic fracture or diastasis. No pelvic bone lesions are seen. IMPRESSION: Negative. Electronically Signed   By: Gerome Sam III  M.D   On: 11/21/2017  16:32   Dg Chest Portable 1 View  Result Date: 11/21/2017 CLINICAL DATA:  Change in mental status EXAM: PORTABLE CHEST 1 VIEW COMPARISON:  June 21, 2017 FINDINGS: Subtle haziness over the right upper lung is thought to be artifactual. Stable tortuous thoracic aorta. The heart, hila, mediastinum, lungs, and pleura are otherwise normal. IMPRESSION: Subtle haziness over the right apex is favored to be artifactual rather than subtle infiltrate. A PA and lateral chest x-ray could further evaluate if clinically warranted. Electronically Signed   By: Gerome Sam III M.D   On: 11/21/2017 16:31   Dg Foot Complete Left  Result Date: 11/21/2017 CLINICAL DATA:  Patient found on floor bathroom. EXAM: LEFT FOOT - COMPLETE 3+ VIEW COMPARISON:  None. FINDINGS: Vascular calcifications.  No fractures identified. IMPRESSION: Negative. Electronically Signed   By: Gerome Sam III M.D   On: 11/21/2017 16:32    Procedures Procedures (including critical care time)  Medications Ordered in ED Medications  0.9 %  sodium chloride infusion (has no administration in time range)  aspirin chewable tablet 324 mg (has no administration in time range)  sodium chloride 0.9 % bolus 1,000 mL (1,000 mLs Intravenous New Bag/Given 11/21/17 2003)     Initial Impression / Assessment and Plan / ED Course  I have reviewed the triage vital signs and the nursing notes.  Pertinent labs & imaging results that were available during my care of the patient were reviewed by me and considered in my medical decision making (see chart for details).    Patient with Parkinson's history presents after unwitnessed syncopal event and found down in the shower.  Patient denies pain at this time, no chest pain and denies shortness of breath.  Patient does have mild cough in the room and mild ecchymosis to the left foot.  Plan for screening blood work, EKG, troponin, CT head and neck and x-rays.  EKG did show mild increased elevation in  inferior leads, similar overall pattern to compared to previous.  Patient denies any chest pain at this time.  Plan for aspirin, troponin  Family arrived to clarify details, mild general weakness/ more whispering than usual with parkinsonism.  No active cp or sob.  No fevers. Blood work reviewed, mild leukocytosis, UA pending.  Troponin mild elevation, repeat sent. Spoke with hospitalist for admission tele for syncope.   Repeat trop mild elevated, requested repeat Ekg, pt had asa.  The patients results and plan were reviewed and discussed.   Any x-rays performed were independently reviewed by myself.   Differential diagnosis were considered with the presenting HPI.  Medications  0.9 %  sodium chloride infusion (has no administration in time range)  aspirin chewable tablet 324 mg (has no administration in time range)  sodium chloride 0.9 % bolus 1,000 mL (1,000 mLs Intravenous New Bag/Given 11/21/17 2003)    Vitals:   11/21/17 1800 11/21/17 1830 11/21/17 1845 11/21/17 1945  BP: (!) 180/98 (!) 156/97 (!) 145/96 (!) 163/103  Pulse: 81 73 71 74  Resp: (!) 24 (!) 23 (!) 23 (!) 22  Temp:      TempSrc:      SpO2: 96% 97% 97% 98%    Final diagnoses:  Syncope and collapse  Fall, initial encounter  Troponin level elevated    Admission/ observation were discussed with the admitting physician, patient and/or family and they are comfortable with the plan.    Final Clinical Impressions(s) / ED Diagnoses   Final diagnoses:  Syncope and collapse  Fall, initial encounter  Troponin level elevated    ED Discharge Orders    None       Blane Ohara, MD 11/21/17 2225

## 2017-11-22 ENCOUNTER — Other Ambulatory Visit: Payer: Self-pay | Admitting: Cardiology

## 2017-11-22 ENCOUNTER — Observation Stay (HOSPITAL_COMMUNITY): Payer: Medicare Other

## 2017-11-22 ENCOUNTER — Ambulatory Visit (HOSPITAL_BASED_OUTPATIENT_CLINIC_OR_DEPARTMENT_OTHER): Payer: Medicare Other

## 2017-11-22 ENCOUNTER — Encounter (HOSPITAL_COMMUNITY): Payer: Self-pay | Admitting: Radiology

## 2017-11-22 DIAGNOSIS — I10 Essential (primary) hypertension: Secondary | ICD-10-CM

## 2017-11-22 DIAGNOSIS — I340 Nonrheumatic mitral (valve) insufficiency: Secondary | ICD-10-CM | POA: Diagnosis not present

## 2017-11-22 DIAGNOSIS — R4020 Unspecified coma: Secondary | ICD-10-CM | POA: Diagnosis not present

## 2017-11-22 DIAGNOSIS — R55 Syncope and collapse: Secondary | ICD-10-CM

## 2017-11-22 DIAGNOSIS — G232 Striatonigral degeneration: Secondary | ICD-10-CM

## 2017-11-22 LAB — BASIC METABOLIC PANEL
ANION GAP: 13 (ref 5–15)
BUN: 22 mg/dL (ref 8–23)
CHLORIDE: 104 mmol/L (ref 98–111)
CO2: 24 mmol/L (ref 22–32)
CREATININE: 1.07 mg/dL (ref 0.61–1.24)
Calcium: 9.5 mg/dL (ref 8.9–10.3)
GFR calc Af Amer: >60 mL/min (ref >60)
GFR calc non Af Amer: >60 mL/min (ref >60)
Glucose, Bld: 99 mg/dL (ref 70–99)
POTASSIUM: 3.7 mmol/L (ref 3.5–5.1)
SODIUM: 141 mmol/L (ref 135–145)

## 2017-11-22 LAB — TROPONIN I
TROPONIN I: 0.64 ng/mL — AB (ref <0.03)
TROPONIN I: 0.48 ng/mL — AB (ref <0.03)

## 2017-11-22 LAB — CBC
HEMATOCRIT: 42.5 % (ref 39.0–52.0)
HEMOGLOBIN: 13.9 g/dL (ref 13.0–17.0)
MCH: 31.2 pg (ref 26.0–34.0)
MCHC: 32.7 g/dL (ref 30.0–36.0)
MCV: 95.5 fL (ref 78.0–100.0)
Platelets: 277 10*3/uL (ref 150–400)
RBC: 4.45 MIL/uL (ref 4.22–5.81)
RDW: 13.4 % (ref 11.5–15.5)
WBC: 12.2 10*3/uL — AB (ref 4.0–10.5)

## 2017-11-22 LAB — ECHOCARDIOGRAM COMPLETE: WEIGHTICAEL: 2366.86 [oz_av]

## 2017-11-22 LAB — GLUCOSE, CAPILLARY
GLUCOSE-CAPILLARY: 120 mg/dL — AB (ref 70–99)
GLUCOSE-CAPILLARY: 152 mg/dL — AB (ref 70–99)
Glucose-Capillary: 110 mg/dL — ABNORMAL HIGH (ref 70–99)
Glucose-Capillary: 135 mg/dL — ABNORMAL HIGH (ref 70–99)

## 2017-11-22 MED ORDER — SODIUM CHLORIDE 0.9 % IV SOLN
3.0000 g | Freq: Three times a day (TID) | INTRAVENOUS | Status: DC
  Administered 2017-11-22 – 2017-11-23 (×5): 3 g via INTRAVENOUS
  Filled 2017-11-22 (×6): qty 3

## 2017-11-22 MED ORDER — PERFLUTREN LIPID MICROSPHERE
1.0000 mL | INTRAVENOUS | Status: AC | PRN
  Administered 2017-11-22: 2 mL via INTRAVENOUS
  Filled 2017-11-22: qty 10

## 2017-11-22 MED ORDER — INSULIN ASPART 100 UNIT/ML ~~LOC~~ SOLN
0.0000 [IU] | Freq: Three times a day (TID) | SUBCUTANEOUS | Status: DC
  Administered 2017-11-22: 1 [IU] via SUBCUTANEOUS
  Administered 2017-11-23: 3 [IU] via SUBCUTANEOUS

## 2017-11-22 MED ORDER — HYDRALAZINE HCL 20 MG/ML IJ SOLN: 5.0000 mg | INTRAMUSCULAR | Status: DC | PRN

## 2017-11-22 NOTE — Progress Notes (Signed)
Progress Note    BENJAMEN Barnett  ZOX:[id number] DOB: [birth year]  DOA: 11/21/2017 PCP: [name], DO \ Neurology: Tat   Brief Narrative:   Chief complaint: altered mental status  Nicholas Barnett is an 84 y.o. male with pmh of Parkinson's disease with progressive decline over the last several months requiring assistance for all ADLs, memory loss, htn, hyperlipidemia, DM2, chronic dysphagia and b12 deficiency admitted last evening for unwitnessed syncopal event. Wife came home from church and found pt passed out in the shower with water still running. He was able to communicate with wife at time with nods and mentation improved by the time he arrived to ED. Pt remembers getting in shower, but no recollection of syncopal event. No bowel incontinence or tongue bite. Denies having any chest pain, palpitations, sob, n/v/d, recent fever or chills.   ED work-up significant for possible infiltrate on chest xray and mild elevation of troponin. Cardiology asked to consult.   Assessment/Plan:   Principal Problem:   Syncope Active Problems:   Essential hypertension   Parkinson's plus syndrome (HCC)   Restless legs syndrome (RLS)   Type II diabetes mellitus with renal manifestations (HCC)   Syncope -unclear etiology. Pt denies any chest pain. Could be related to Parkinsonism and recent decline -MRI brain no acute process, chronic ischemic changes, remote infarcts -No PE or aortic dissection on CT angio -no events overnight on telemetry.  -mild elevation in trop 0.25>>0.64>>0.48. EKG NSR, bigeminy. Cardiology following -Await 2Decho and EEG results, continue monitoring on tele  Elevated troponin -see above. Pt denies any chest pain.  -neg cardiac w/u in 2016 -await 2Decho results  Possible aspiration with known pharyngeal dysphagia -WBC improved on Unasyn D1. Continue to monitor trend -afebrile and NT appearing -repeat SLP eval done today revealing pt is severe aspiration risk and  recommending regular diet with thin liquids.  Blistering of skin -right calf, medial aspect with blistering. Could be related to water temp/synocopal event -no s/sxs of infection -dressing, monitor  Parkinsons disease  -Progressive decline over the last several months per daughter. Wife acts as primary caretaker and pt requiring assistance for all ADL's.  -Ok for PT to eval to determine d/c needs. Family would prefer home, but have done ST rehab in past -continue Sinemet, Remeron  Hypertension -mild elevation -continue ACEI with prn hydralazine and monitor  DM2 -holding Metformin while inpt -will order SSI  Family Communication/Anticipated D/C date and plan/Code Status   DVT prophylaxis: SQ Lovenox Code Status: DNR Family Communication: spoke with daughter at bedside Disposition Plan: await PT recs. Short-term rehab vs Home with [facility]   Medical Consultants:    Cardiology   Anti-Infectives:    Unasyn D1  Subjective:   No complaints. Daughter at bedside  Objective:    Vitals:   11/22/17 0212 11/22/17 0653 11/22/17 1114 11/22/17 1254  BP:  (!) 171/98 (!) 150/94 129/80  Pulse:  69 70 76  Resp:  16  18  Temp:  (!) 97.4 F (36.3 C)  97.7 F (36.5 C)  TempSrc:  Oral  Oral  SpO2:  100% 100% 95%  Weight: 67.1 kg       Intake/Output Summary (Last 24 hours) at 11/22/2017 1534 Last data filed at 11/22/2017 1341 Gross per 24 hour  Intake 390 ml  Output 450 ml  Net -60 ml   Filed Weights   11/22/17 0212  Weight: 67.1 kg    Exam: General: easily, awakened alert lying supine in  bed in NAD EENT: atraumatic, normocephalic, EOMI, MMM Neck: supple without thyromegaly or lymphadenopathy, no JVD or carotid bruits CV: S1S2 RRR, no m/r/g, no LE edema Resp: Decreased effort, CTAB, no w/r/c, no increased wob GI: abdomen flat, soft, NT/ND, BS+ Neuro: no facial asymmetry, tongue is midline. MAEx4 generalized weakness without focal deficit on exam Psych: normal mood and  affect  Data Reviewed:   I have personally reviewed following labs and imaging studies:  Labs: Labs show the following:   Basic Metabolic Panel: Recent Labs  Lab 11/21/17 1648 11/21/17 2123 11/22/17 0232  NA 142  --  141  K 3.8  --  3.7  CL 105  --  104  CO2 24  --  24  GLUCOSE 132*  --  99  BUN 21  --  22  CREATININE 1.10 1.08 1.07  CALCIUM 9.9  --  9.5   GFR Estimated Creatinine Clearance: 48.8 mL/min (by C-G formula based on SCr of 1.07 mg/dL). Liver Function Tests: Recent Labs  Lab 11/21/17 1648  AST 27  ALT 15  ALKPHOS 63  BILITOT 1.1  PROT 7.1  ALBUMIN 3.9   No results for input(s): LIPASE, AMYLASE in the last 168 hours. No results for input(s): AMMONIA in the last 168 hours. Coagulation profile No results for input(s): INR, PROTIME in the last 168 hours.  CBC: Recent Labs  Lab 11/21/17 1648 11/22/17 0232  WBC 16.5* 12.2*  NEUTROABS 15.1*  --   HGB 16.0 13.9  HCT 48.0 42.5  MCV 95.0 95.5  PLT 281 277   Cardiac Enzymes: Recent Labs  Lab 11/21/17 1648 11/21/17 2123 11/22/17 0232 11/22/17 0907  TROPONINI 0.04* 0.25* 0.64* 0.48*   BNP (last 3 results) No results for input(s): PROBNP in the last 8760 hours. CBG: Recent Labs  Lab 11/21/17 2359 11/22/17 1251  GLUCAP 110* 120*   D-Dimer: No results for input(s): DDIMER in the last 72 hours. Hgb A1c: No results for input(s): HGBA1C in the last 72 hours. Lipid Profile: No results for input(s): CHOL, HDL, LDLCALC, TRIG, CHOLHDL, LDLDIRECT in the last 72 hours. Thyroid function studies: Recent Labs    11/21/17 2020  TSH 0.990   Anemia work up: No results for input(s): VITAMINB12, FOLATE, FERRITIN, TIBC, IRON, RETICCTPCT in the last 72 hours. Sepsis Labs: Recent Labs  Lab 11/21/17 1648 11/22/17 0232  WBC 16.5* 12.2*    Microbiology No results found for this or any previous visit (from the past 240 hour(s)).  Procedures and diagnostic studies:  Dg Chest 2 View  Result Date:  11/21/2017 CLINICAL DATA:  Abnormal chest radiograph EXAM: CHEST - 2 VIEW COMPARISON:  11/21/2017 at 1532 hours FINDINGS: Persistent medial right upper lobe opacity. Additional mild patchy opacity overlying the right mid lung. This appearance favors multifocal infection/pneumonia. Left lung is essentially clear. No pleural effusion or pneumothorax. The heart is normal in size. IMPRESSION: Mild patchy right lung opacities, favoring multifocal infection/pneumonia. Follow-up is suggested to document clearance. Electronically Signed   By: Charline Bills M.D.   On: 11/21/2017 22:23   Ct Head Wo Contrast  Result Date: 11/21/2017 CLINICAL DATA:  Syncope. EXAM: CT HEAD WITHOUT CONTRAST CT CERVICAL SPINE WITHOUT CONTRAST TECHNIQUE: Multidetector CT imaging of the head and cervical spine was performed following the standard protocol without intravenous contrast. Multiplanar CT image reconstructions of the cervical spine were also generated. COMPARISON:  None. FINDINGS: CT HEAD FINDINGS Brain: Moderate diffuse cortical atrophy is noted. Mild chronic ischemic white matter disease is noted.  No mass effect or midline shift is noted. Ventricular size is within normal limits. There is no evidence of mass lesion, hemorrhage or acute infarction. Vascular: No hyperdense vessel or unexpected calcification. Skull: Normal. Negative for fracture or focal lesion. Sinuses/Orbits: No acute finding. Other: None. CT CERVICAL SPINE FINDINGS Alignment: Normal. Skull base and vertebrae: No acute fracture. No primary bone lesion or focal pathologic process. Soft tissues and spinal canal: No prevertebral fluid or swelling. No visible canal hematoma. Disc levels: Moderate degenerative disc disease is noted at C6-7 and C7-T1. Upper chest: Negative. Other: Degenerative changes are seen involving posterior facet joints bilaterally. IMPRESSION: Moderate diffuse cortical atrophy. Mild chronic ischemic white matter disease. No acute intracranial  abnormality seen. Multilevel degenerative disc disease. No acute abnormality seen in the cervical spine. Electronically Signed   By: Lupita Raider, M.D.   On: 11/21/2017 18:32   Ct Cervical Spine Wo Contrast  Result Date: 11/21/2017 CLINICAL DATA:  Syncope. EXAM: CT HEAD WITHOUT CONTRAST CT CERVICAL SPINE WITHOUT CONTRAST TECHNIQUE: Multidetector CT imaging of the head and cervical spine was performed following the standard protocol without intravenous contrast. Multiplanar CT image reconstructions of the cervical spine were also generated. COMPARISON:  None. FINDINGS: CT HEAD FINDINGS Brain: Moderate diffuse cortical atrophy is noted. Mild chronic ischemic white matter disease is noted. No mass effect or midline shift is noted. Ventricular size is within normal limits. There is no evidence of mass lesion, hemorrhage or acute infarction. Vascular: No hyperdense vessel or unexpected calcification. Skull: Normal. Negative for fracture or focal lesion. Sinuses/Orbits: No acute finding. Other: None. CT CERVICAL SPINE FINDINGS Alignment: Normal. Skull base and vertebrae: No acute fracture. No primary bone lesion or focal pathologic process. Soft tissues and spinal canal: No prevertebral fluid or swelling. No visible canal hematoma. Disc levels: Moderate degenerative disc disease is noted at C6-7 and C7-T1. Upper chest: Negative. Other: Degenerative changes are seen involving posterior facet joints bilaterally. IMPRESSION: Moderate diffuse cortical atrophy. Mild chronic ischemic white matter disease. No acute intracranial abnormality seen. Multilevel degenerative disc disease. No acute abnormality seen in the cervical spine. Electronically Signed   By: Lupita Raider, M.D.   On: 11/21/2017 18:32   Mr Brain Wo Contrast  Result Date: 11/22/2017 CLINICAL DATA:  Syncopal episode, found down in bathroom. Altered level of consciousness. History of Parkinson's disease, recurrent falls, hypertension and diabetes. EXAM:  MRI HEAD WITHOUT CONTRAST TECHNIQUE: Multiplanar, multiecho pulse sequences of the brain and surrounding structures were obtained without intravenous contrast. COMPARISON:  CT HEAD November 21, 2017 and MRI head November 21, 2013. FINDINGS: INTRACRANIAL CONTENTS: No reduced diffusion to suggest acute ischemia. No susceptibility artifact to suggest hemorrhage. Moderate to severe ventriculomegaly with narrowed callosum angle and sulcal effacement at the convexities. Symmetric basal ganglia mineralization. Susceptibility artifact within bilateral parietal and LEFT occipital lobe encephalomalacia. Old RIGHT thalamus lacunar infarct. Patchy supratentorial white matter FLAIR T2 hyperintensities exclusive aforementioned abnormality. No midline shift. Similar prominent bifrontal extra-axial spaces without extra-axial fluid collection. VASCULAR: Normal major intracranial vascular flow voids present at skull base. SKULL AND UPPER CERVICAL SPINE: No abnormal sellar expansion. No suspicious calvarial bone marrow signal. Craniocervical junction maintained. SINUSES/ORBITS: The mastoid air-cells and included paranasal sinuses are well-aerated.The included ocular globes and orbital contents are non-suspicious. Status post bilateral ocular lens implants. OTHER: None. IMPRESSION: 1. No acute intracranial process. 2. Old small biparietal and LEFT occipital lobe infarcts, possible old PRES or TBI. 3. Moderate to severe parenchymal brain volume loss and imaging findings  of normal pressure hydrocephalus. 4. Mild chronic small vessel ischemic changes. Old RIGHT thalamus lacunar infarct. Electronically Signed   By: Awilda Metro M.D.   On: 11/22/2017 06:37   Dg Pelvis Portable  Result Date: 11/21/2017 CLINICAL DATA:  Patient found down. EXAM: PORTABLE PELVIS 1-2 VIEWS COMPARISON:  None. FINDINGS: There is no evidence of pelvic fracture or diastasis. No pelvic bone lesions are seen. IMPRESSION: Negative. Electronically Signed    By: Gerome Sam III M.D   On: 11/21/2017 16:32   Dg Chest Portable 1 View  Result Date: 11/21/2017 CLINICAL DATA:  Change in mental status EXAM: PORTABLE CHEST 1 VIEW COMPARISON:  June 21, 2017 FINDINGS: Subtle haziness over the right upper lung is thought to be artifactual. Stable tortuous thoracic aorta. The heart, hila, mediastinum, lungs, and pleura are otherwise normal. IMPRESSION: Subtle haziness over the right apex is favored to be artifactual rather than subtle infiltrate. A PA and lateral chest x-ray could further evaluate if clinically warranted. Electronically Signed   By: Gerome Sam III M.D   On: 11/21/2017 16:31   Dg Foot Complete Left  Result Date: 11/21/2017 CLINICAL DATA:  Patient found on floor bathroom. EXAM: LEFT FOOT - COMPLETE 3+ VIEW COMPARISON:  None. FINDINGS: Vascular calcifications.  No fractures identified. IMPRESSION: Negative. Electronically Signed   By: Gerome Sam III M.D   On: 11/21/2017 16:32    Medications:   . aspirin EC  81 mg Oral Daily  . benazepril  5 mg Oral Daily  . calcium-vitamin D  1 tablet Oral BID WC  . carbidopa-levodopa  3 tablet Oral 2 times per day  . enoxaparin (LOVENOX) injection  40 mg Subcutaneous Q24H  . gabapentin  300 mg Oral QHS  . mirtazapine  30 mg Oral QHS  . pantoprazole  40 mg Oral BID  . polycarbophil  625 mg Oral QHS  . pravastatin  20 mg Oral QHS  . pyridOXINE  100 mg Oral Daily  . tamsulosin  0.4 mg Oral QHS   Continuous Infusions: . sodium chloride 75 mL/hr at 11/22/17 0340  . ampicillin-sulbactam (UNASYN) IV 3 g (11/22/17 1124)     LOS: 0 days   Cordelia Pen, NP-C [facility]  pgr [phone]    *Please refer to [email or web address], password [email or web address] to get updated schedule on who will round on this patient, as hospitalists switch teams weekly. If 7PM-7AM, please contact night-coverage at [email or web address], password [email or web address] for any overnight needs.  11/22/2017, 3:34 PM

## 2017-11-22 NOTE — Progress Notes (Signed)
PT Cancellation Note  Patient Details Name: Nicholas Barnett MRN: [id number] DOB: 1931/08/19   Cancelled Treatment:    Reason Eval/Treat Not Completed: Medical issues which prohibited therapy Patient with troponins steadily increasing (0.25 to 0.64 last 2 labs); hold for now due to increasing troponins in face of possible cardiac issues. Will follow and initiate eval when patient is medically appropriate and safe to participate in activity.    Nedra Hai PT, DPT, CBIS  Supplemental Physical Therapist [facility]    Pager [phone] Acute Rehab Office [phone]

## 2017-11-22 NOTE — Progress Notes (Addendum)
Progress Note  Patient Name: Nicholas Barnett Date of Encounter: 11/22/2017  Primary Cardiologist:   Subjective   The patient in bed, no complaints.  Inpatient Medications    Scheduled Meds: . aspirin EC  81 mg Oral Daily  . benazepril  5 mg Oral Daily  . calcium-vitamin D  1 tablet Oral BID WC  . carbidopa-levodopa  3 tablet Oral 2 times per day  . enoxaparin (LOVENOX) injection  40 mg Subcutaneous Q24H  . gabapentin  300 mg Oral QHS  . insulin aspart  0-9 Units Subcutaneous TID WC  . mirtazapine  30 mg Oral QHS  . pantoprazole  40 mg Oral BID  . polycarbophil  625 mg Oral QHS  . pravastatin  20 mg Oral QHS  . pyridOXINE  100 mg Oral Daily  . tamsulosin  0.4 mg Oral QHS   Continuous Infusions: . sodium chloride 75 mL/hr at 11/22/17 0340  . ampicillin-sulbactam (UNASYN) IV 3 g (11/22/17 1625)   PRN Meds: acetaminophen **OR** acetaminophen, hydrALAZINE, ondansetron **OR** ondansetron (ZOFRAN) IV   Vital Signs    Vitals:   11/22/17 0653 11/22/17 1114 11/22/17 1254 11/22/17 1634  BP: (!) 171/98 (!) 150/94 129/80 132/85  Pulse: 69 70 76 66  Resp: 16  18 20   Temp: (!) 97.4 F (36.3 C)  97.7 F (36.5 C) 97.7 F (36.5 C)  TempSrc: Oral  Oral Oral  SpO2: 100% 100% 95% 99%  Weight:        Intake/Output Summary (Last 24 hours) at 11/22/2017 1652 Last data filed at 11/22/2017 1341 Gross per 24 hour  Intake 390 ml  Output 450 ml  Net -60 ml   Filed Weights   11/22/17 0212  Weight: 67.1 kg    Telemetry    Sinus rhythm, PACs- Personally Reviewed  ECG    Sinus rhythm, PACs in a pattern of bigeminy- Personally Reviewed  Physical Exam  Resting comfortably, early appearing gentleman, whispering words GEN: No acute distress.   Neck: No JVD Cardiac: RRR, no murmurs, rubs, or gallops.  Respiratory: Clear to auscultation bilaterally. GI: Soft, nontender, non-distended  MS: No edema; No deformity. Neuro:  Nonfocal  Psych: Normal affect   Labs     Chemistry Recent Labs  Lab 11/21/17 1648 11/21/17 2123 11/22/17 0232  NA 142  --  141  K 3.8  --  3.7  CL 105  --  104  CO2 24  --  24  GLUCOSE 132*  --  99  BUN 21  --  22  CREATININE 1.10 1.08 1.07  CALCIUM 9.9  --  9.5  PROT 7.1  --   --   ALBUMIN 3.9  --   --   AST 27  --   --   ALT 15  --   --   ALKPHOS 63  --   --   BILITOT 1.1  --   --   GFRNONAA 60* >60 >60  GFRAA >60 >60 >60  ANIONGAP 13  --  13     Hematology Recent Labs  Lab 11/21/17 1648 11/22/17 0232  WBC 16.5* 12.2*  RBC 5.05 4.45  HGB 16.0 13.9  HCT 48.0 42.5  MCV 95.0 95.5  MCH 31.7 31.2  MCHC 33.3 32.7  RDW 13.5 13.4  PLT 281 277    Cardiac Enzymes Recent Labs  Lab 11/21/17 1648 11/21/17 2123 11/22/17 0232 11/22/17 0907  TROPONINI 0.04* 0.25* 0.64* 0.48*    Recent Labs  Lab 11/21/17 1656  11/21/17 2005  TROPIPOC 0.00 0.14*     BNPNo results for input(s): BNP, PROBNP in the last 168 hours.   DDimer No results for input(s): DDIMER in the last 168 hours.   Radiology    Dg Chest 2 View  Result Date: 11/21/2017 CLINICAL DATA:  Abnormal chest radiograph EXAM: CHEST - 2 VIEW COMPARISON:  11/21/2017 at 1532 hours FINDINGS: Persistent medial right upper lobe opacity. Additional mild patchy opacity overlying the right mid lung. This appearance favors multifocal infection/pneumonia. Left lung is essentially clear. No pleural effusion or pneumothorax. The heart is normal in size. IMPRESSION: Mild patchy right lung opacities, favoring multifocal infection/pneumonia. Follow-up is suggested to document clearance. Electronically Signed   By: Charline Bills M.D.   On: 11/21/2017 22:23   Ct Head Wo Contrast  Result Date: 11/21/2017 CLINICAL DATA:  Syncope. EXAM: CT HEAD WITHOUT CONTRAST CT CERVICAL SPINE WITHOUT CONTRAST TECHNIQUE: Multidetector CT imaging of the head and cervical spine was performed following the standard protocol without intravenous contrast. Multiplanar CT image  reconstructions of the cervical spine were also generated. COMPARISON:  None. FINDINGS: CT HEAD FINDINGS Brain: Moderate diffuse cortical atrophy is noted. Mild chronic ischemic white matter disease is noted. No mass effect or midline shift is noted. Ventricular size is within normal limits. There is no evidence of mass lesion, hemorrhage or acute infarction. Vascular: No hyperdense vessel or unexpected calcification. Skull: Normal. Negative for fracture or focal lesion. Sinuses/Orbits: No acute finding. Other: None. CT CERVICAL SPINE FINDINGS Alignment: Normal. Skull base and vertebrae: No acute fracture. No primary bone lesion or focal pathologic process. Soft tissues and spinal canal: No prevertebral fluid or swelling. No visible canal hematoma. Disc levels: Moderate degenerative disc disease is noted at C6-7 and C7-T1. Upper chest: Negative. Other: Degenerative changes are seen involving posterior facet joints bilaterally. IMPRESSION: Moderate diffuse cortical atrophy. Mild chronic ischemic white matter disease. No acute intracranial abnormality seen. Multilevel degenerative disc disease. No acute abnormality seen in the cervical spine. Electronically Signed   By: Lupita Raider, M.D.   On: 11/21/2017 18:32   Ct Cervical Spine Wo Contrast  Result Date: 11/21/2017 CLINICAL DATA:  Syncope. EXAM: CT HEAD WITHOUT CONTRAST CT CERVICAL SPINE WITHOUT CONTRAST TECHNIQUE: Multidetector CT imaging of the head and cervical spine was performed following the standard protocol without intravenous contrast. Multiplanar CT image reconstructions of the cervical spine were also generated. COMPARISON:  None. FINDINGS: CT HEAD FINDINGS Brain: Moderate diffuse cortical atrophy is noted. Mild chronic ischemic white matter disease is noted. No mass effect or midline shift is noted. Ventricular size is within normal limits. There is no evidence of mass lesion, hemorrhage or acute infarction. Vascular: No hyperdense vessel or  unexpected calcification. Skull: Normal. Negative for fracture or focal lesion. Sinuses/Orbits: No acute finding. Other: None. CT CERVICAL SPINE FINDINGS Alignment: Normal. Skull base and vertebrae: No acute fracture. No primary bone lesion or focal pathologic process. Soft tissues and spinal canal: No prevertebral fluid or swelling. No visible canal hematoma. Disc levels: Moderate degenerative disc disease is noted at C6-7 and C7-T1. Upper chest: Negative. Other: Degenerative changes are seen involving posterior facet joints bilaterally. IMPRESSION: Moderate diffuse cortical atrophy. Mild chronic ischemic white matter disease. No acute intracranial abnormality seen. Multilevel degenerative disc disease. No acute abnormality seen in the cervical spine. Electronically Signed   By: Lupita Raider, M.D.   On: 11/21/2017 18:32   Mr Brain Wo Contrast  Result Date: 11/22/2017 CLINICAL DATA:  Syncopal episode, found  down in bathroom. Altered level of consciousness. History of Parkinson's disease, recurrent falls, hypertension and diabetes. EXAM: MRI HEAD WITHOUT CONTRAST TECHNIQUE: Multiplanar, multiecho pulse sequences of the brain and surrounding structures were obtained without intravenous contrast. COMPARISON:  CT HEAD November 21, 2017 and MRI head November 21, 2013. FINDINGS: INTRACRANIAL CONTENTS: No reduced diffusion to suggest acute ischemia. No susceptibility artifact to suggest hemorrhage. Moderate to severe ventriculomegaly with narrowed callosum angle and sulcal effacement at the convexities. Symmetric basal ganglia mineralization. Susceptibility artifact within bilateral parietal and LEFT occipital lobe encephalomalacia. Old RIGHT thalamus lacunar infarct. Patchy supratentorial white matter FLAIR T2 hyperintensities exclusive aforementioned abnormality. No midline shift. Similar prominent bifrontal extra-axial spaces without extra-axial fluid collection. VASCULAR: Normal major intracranial vascular  flow voids present at skull base. SKULL AND UPPER CERVICAL SPINE: No abnormal sellar expansion. No suspicious calvarial bone marrow signal. Craniocervical junction maintained. SINUSES/ORBITS: The mastoid air-cells and included paranasal sinuses are well-aerated.The included ocular globes and orbital contents are non-suspicious. Status post bilateral ocular lens implants. OTHER: None. IMPRESSION: 1. No acute intracranial process. 2. Old small biparietal and LEFT occipital lobe infarcts, possible old PRES or TBI. 3. Moderate to severe parenchymal brain volume loss and imaging findings of normal pressure hydrocephalus. 4. Mild chronic small vessel ischemic changes. Old RIGHT thalamus lacunar infarct. Electronically Signed   By: Awilda Metro M.D.   On: 11/22/2017 06:37   Dg Pelvis Portable  Result Date: 11/21/2017 CLINICAL DATA:  Patient found down. EXAM: PORTABLE PELVIS 1-2 VIEWS COMPARISON:  None. FINDINGS: There is no evidence of pelvic fracture or diastasis. No pelvic bone lesions are seen. IMPRESSION: Negative. Electronically Signed   By: Gerome Sam III M.D   On: 11/21/2017 16:32   Dg Chest Portable 1 View  Result Date: 11/21/2017 CLINICAL DATA:  Change in mental status EXAM: PORTABLE CHEST 1 VIEW COMPARISON:  June 21, 2017 FINDINGS: Subtle haziness over the right upper lung is thought to be artifactual. Stable tortuous thoracic aorta. The heart, hila, mediastinum, lungs, and pleura are otherwise normal. IMPRESSION: Subtle haziness over the right apex is favored to be artifactual rather than subtle infiltrate. A PA and lateral chest x-ray could further evaluate if clinically warranted. Electronically Signed   By: Gerome Sam III M.D   On: 11/21/2017 16:31   Dg Foot Complete Left  Result Date: 11/21/2017 CLINICAL DATA:  Patient found on floor bathroom. EXAM: LEFT FOOT - COMPLETE 3+ VIEW COMPARISON:  None. FINDINGS: Vascular calcifications.  No fractures identified. IMPRESSION: Negative.  Electronically Signed   By: Gerome Sam III M.D   On: 11/21/2017 16:32   Cardiac Studies     Patient Profile     84 y.o. male with a past medical history significant for Parkinson's disease, memory loss, dysphagia, gait difficulty and previous recurrent falls, type 2 diabetes mellitus, hypertension, and hyperlipidemia who is admitted after an episode of unwitnessed syncope.  Assessment & Plan    1.   Elevated Troponin   Troponin peak at 0.64 now downtrending 0.48, the patient is completely asymptomatic, per daughter this is third episode of troponin elevation in the last 3 years, in 2016 similar troponin elevation of 0.40 when medical management was recommended.  In addition head CT this hospitalization showed prior ischemic changes.  His echocardiogram shows preserved LVEF -I have personally reviewed it.  I have discussed his condition with his daughter and wife, the patient is minimally functional with prior ischemic strokes and alternating mental status, he has no symptoms of chest  pain or shortness of breath and preserved LVEF,  At this point I would proceed with medical management, his family agrees.  Continue aspirin, and ACE inhibitor, uptitrate as needed for blood pressure control.  It is possible that he is requiring troponin elevations might be related to his intracranial changes.     2.  Hypertension -currently controlled, but significantly elevated on admission, uptitrate benazepril as needed.  3.  Syncope -telemetry only shows PACs, no pauses or bradycardia, we will arrange for 2-week outpatient monitor followed by an appointment with Dr. Allyson Sabal. -This is possibly a combination of Parkinson related autonomic dysfunction exacerbated by hot water in the shower.  Telemetry no-revealing no bradycardia, pauses or arrhythmias, he can be discharged from cardiac standpoint, we will arrange for an outpatient follow up and 2 week event monitor.   For questions or updates,  please contact [facility] Please consult [email or web address] for contact info under     Signed, Tobias Alexander, MD  11/22/2017, 4:52 PM

## 2017-11-22 NOTE — Progress Notes (Signed)
  Echocardiogram 2D Echocardiogram has been performed.  Nicholas Barnett F 11/22/2017, 10:46 AM

## 2017-11-22 NOTE — Evaluation (Addendum)
Clinical/Bedside Swallow Evaluation Patient Details  Name: Nicholas Barnett MRN: [id number] Date of Birth: September 09, 1931  Today's Date: 11/22/2017 Time: SLP Start Time (ACUTE ONLY): 8295 SLP Stop Time (ACUTE ONLY): 0836 SLP Time Calculation (min) (ACUTE ONLY): 15 min  Past Medical History:  Past Medical History:  Diagnosis Date  . Abnormality of gait   . Arthritis   . Bifascicular block   . Cyanocobalamin deficiency   . Depression   . Diverticulitis   . Dizzy   . Dysphagia, idiopathic    History of aspiration  . Gait disturbance   . Hearing loss of both ears   . Hyperlipidemia   . Hypertension   . Lumbar back pain   . Memory loss   . Neck pain   . Osteoarthritis   . Osteoporosis   . Other and unspecified hyperlipidemia   . Parkinsonism (HCC)   . Recurrent falls    Using a cane and walker  . Restless leg   . Restless legs syndrome (RLS)   . Right knee pain   . Shoulder pain   . Thoracic back pain   . Thyroid disease   . Type 2 diabetes mellitus with autonomic neuropathy (HCC)   . Unspecified essential hypertension   . Unspecified hypothyroidism   . Vitamin D deficiency    Past Surgical History:  Past Surgical History:  Procedure Laterality Date  . CATARACT EXTRACTION Bilateral 2012   Dr. Hazle Quant   HPI:  Nicholas Barnett is a 84 y.o. with hx of Parkinson's disease, pneumonia, high blood pressure, type 2 diabetes, thyroid disease, memory loss, bilateral hearing loss, idiophathic dysphagia and diverticulitis. He presented to ED on 9/29 with AMS after an unwitnessed fall in the shower. Per chart, pt has difficulty managing secretions and often feels strangled during meals at baseline and previous MBS (2015 and 2016) confirmed silent aspiration of liquids. Was seen at [facility] again for pna in 2018. CXR revealed mild patchy right lung opacities, favoring multifocal infection/pneumonia. MRI displayed no acute intracranial process, old biparietal, left occipital lobe, and right thalamus  lacunar infarcts.   Assessment / Plan / Recommendation Clinical Impression  Pt was alert and cooperative throughout our evaluation, which revealed findings consistent with pharyngeal dysphagia marked by immediate and delayed cough following deglutation of thin. His daughter at bedside provided details regarding pt's worsening dysphagia secondary to Parkinsonism. She reported pt exhibits frequent coughing particularly with liquids, but also in absence of POs (difficulty mangaing secretions). Following last hospital admission for pneumonia (2018) and many attempts to manage dysphagia via diet modification and specialized feeding instruments, pt and family decided to pursue regular diet and thin liquids to increase overall PO intake and pleasure, accepting the risk of aspiration. Pt has suction available at home and family available to supervise/encourage pt's use of compensatory strategies for safest swallow, including 90 degree upright positioning, small bites and sips, and minimizing environmental distractions. SLP further educated pt and family regarding aspiration precautions and also recommended pt utilize intermittent volitional cough/throat clear throughout meals to facilitate clearance of aspirates, check oral cavity and perform lingual sweep to remove any pocketed POs. Given pt's/family's wishes and evidence of their extensive education regarding aspiration/pneumonia as well as evaluative and treatment measures available, recommend pt continue regular diet and thin liquids using compensatory strategies mentioned above. No further ST is indicated at this time.    SLP Visit Diagnosis: Dysphagia, pharyngeal phase (R13.13)    Aspiration Risk  Severe aspiration risk    Diet  Recommendation Regular;Thin liquid   Liquid Administration via: Cup Medication Administration: Whole meds with puree Supervision: Staff to assist with self feeding;Full supervision/cueing for compensatory  strategies Compensations: Minimize environmental distractions;Slow rate;Small sips/bites;Lingual sweep for clearance of pocketing;Clear throat intermittently Postural Changes: Seated upright at 90 degrees    Other  Recommendations Oral Care Recommendations: Oral care BID Other Recommendations: Have oral suction available   Follow up Recommendations None      Frequency and Duration            Prognosis        Swallow Study   General HPI: Nicholas Barnett is a 84 y.o. with hx of Parkinson's disease, pneumonia, high blood pressure, type 2 diabetes, thyroid disease, memory loss, bilateral hearing loss, idiophathic dysphagia and diverticulitis. He presented to ED on 9/29 with AMS after an unwitnessed fall in the shower. Per chart, pt has difficulty managing secretions and often feels strangled during meals at baseline and previous MBS (2015 and 2016) confirmed silent aspiration of liquids. Was seen at [facility] again for pna in 2018. CXR revealed mild patchy right lung opacities, favoring multifocal infection/pneumonia. MRI displayed no acute intracranial process, old biparietal, left occipital lobe, and right thalamus lacunar infarcts. Type of Study: Bedside Swallow Evaluation Previous Swallow Assessment: BSE (2016) MBS (2015 and 2016)(BSE 01/2017) Diet Prior to this Study: Regular;Thin liquids Temperature Spikes Noted: No Respiratory Status: Nasal cannula History of Recent Intubation: No Behavior/Cognition: Alert;Cooperative;Pleasant mood;Requires cueing Oral Cavity Assessment: Within Functional Limits Oral Care Completed by SLP: No Oral Cavity - Dentition: Adequate natural dentition Vision: Functional for self-feeding Self-Feeding Abilities: Able to feed self;Needs set up Patient Positioning: Upright in bed Baseline Vocal Quality: Low vocal intensity Volitional Cough: Weak Volitional Swallow: Able to elicit    Oral/Motor/Sensory Function Overall Oral Motor/Sensory Function: Mild  impairment Facial ROM: Within Functional Limits Facial Symmetry: Within Functional Limits Facial Strength: Reduced right;Reduced left Lingual ROM: Within Functional Limits Lingual Symmetry: Within Functional Limits Lingual Strength: Reduced Mandible: Within Functional Limits   Ice Chips Ice chips: Not tested   Thin Liquid Thin Liquid: Impaired Presentation: Cup;Self Fed Oral Phase Impairments: (none) Oral Phase Functional Implications: (none) Pharyngeal  Phase Impairments: Cough - Immediate;Cough - Delayed    Nectar Thick Nectar Thick Liquid: Not tested   Honey Thick Honey Thick Liquid: Not tested   Puree Puree: Within functional limits Presentation: Spoon;Self Fed   Solid    Suzzette Righter, Student SLP  Solid: Not tested      Suzzette Righter 11/22/2017,9:35 AM

## 2017-11-22 NOTE — Progress Notes (Signed)
EEG complete - results pending 

## 2017-11-22 NOTE — Plan of Care (Signed)
  Problem: Pain Managment: Goal: General experience of comfort will improve Outcome: Progressing   Problem: Safety: Goal: Ability to remain free from injury will improve Outcome: Progressing   

## 2017-11-22 NOTE — Progress Notes (Signed)
PT. Arrived to the unit via stretcher from ED. Pt. Alert and stable. Family at bedside. Pt. Placed on telemetry. CCMD notified of pts. Arrival to the unit. Call light placed within reach. Pts. Family stated pt. Does not need low bed at this time. Mats to be placed.

## 2017-11-22 NOTE — Procedures (Signed)
ELECTROENCEPHALOGRAM REPORT   Patient: Nicholas Barnett       Room #: 3E19C EEG No. ID: [id number] Age: 84 y.o.        Sex: male Referring Physician: Benjamine Mola Report Date:  11/22/2017        Interpreting Physician: Thana Farr  History: [name] is an 84 y.o. male with syncope evaluated to rule out seizure  Medications:  Unasyn, ASA, Lotensin, Oscal, Sinemet, Neurontin, Insulin, Remeron, Protonix, Fibercon, Pravachol, B6, Flomax  Conditions of Recording:  This is a 21 channel routine scalp EEG performed with bipolar and monopolar montages arranged in accordance to the international 10/20 system of electrode placement. One channel was dedicated to EKG recording.  The patient is in the awake and drowsy states.  Description:  The waking background activity consists of a low voltage, symmetrical, fairly well organized, 8 Hz alpha activity, seen from the parieto-occipital and posterior temporal regions.  Low voltage fast activity, poorly organized, is seen anteriorly and is at times superimposed on more posterior regions.  A mixture of theta and alpha rhythms are seen from the central and temporal regions. The patient drowses with slowing to irregular, low voltage theta and beta activity.   Stage II sleep is not obtained. No epileptiform activity is noted.   Hyperventilation and intermittent photic stimulation were not performed.   IMPRESSION: Normal electroencephalogram, awake and drowsy. There are no focal lateralizing or epileptiform features.   Thana Farr, MD Neurology [phone] 11/22/2017, 6:04 PM

## 2017-11-22 NOTE — Progress Notes (Signed)
Pharmacy Antibiotic Note  Nicholas Barnett is a 84 y.o. male admitted on 11/21/2017 with fall/syncope/altered mental status.  Pharmacy has been consulted for Unasyn dosing for possible aspiration PNA. WBC is elevated. Renal function Ok. CXR with patchy right lung opacities.   Plan: Unasyn 3g IV q8h Trend WBC, temp, renal function  F/U infectious work-up  Temp (24hrs), Avg:97 F (36.1 C), Min:95.8 F (35.4 C), Max:97.7 F (36.5 C)  Recent Labs  Lab 11/21/17 1648 11/21/17 2123  WBC 16.5*  --   CREATININE 1.10 1.08    CrCl cannot be calculated (Unknown ideal weight.).    Allergies  Allergen Reactions  . Caffeine Swelling and Other (See Comments)    Reaction:  Joint swelling     Abran Duke 11/22/2017 12:29 AM

## 2017-11-22 NOTE — Consult Note (Addendum)
WOC Nurse wound consult note Reason for Consult: Consult requested for right leg.  According to pt's wife, he fell at home prior to admission and this location was pressed against an object in the bathroom; it is semicircular and clear fluid filled blisters to anterior calf.  Affected area is 7X2cm; blisters ruptured easily when touched, draining small amt clear yellow fluid and skin flap is intact over the site. There is another partial thickness abrasion located nearby on the leg; 1X1X.1xm, pink and dry Wound type: partial thickness wounds Dressing procedure/placement/frequency:  Foam dressing to protect sites and promote healing.  Discussed plan of care with patient and wife at the bedside. Please re-consult if further assistance is needed.  Thank-you,  Cammie Mcgee MSN, RN, CWOCN, [place], CNS [phone]

## 2017-11-23 ENCOUNTER — Encounter (HOSPITAL_COMMUNITY): Payer: Self-pay | Admitting: General Practice

## 2017-11-23 ENCOUNTER — Other Ambulatory Visit: Payer: Self-pay

## 2017-11-23 ENCOUNTER — Other Ambulatory Visit: Payer: Self-pay | Admitting: Cardiology

## 2017-11-23 DIAGNOSIS — I10 Essential (primary) hypertension: Secondary | ICD-10-CM | POA: Diagnosis not present

## 2017-11-23 DIAGNOSIS — R7989 Other specified abnormal findings of blood chemistry: Secondary | ICD-10-CM | POA: Diagnosis not present

## 2017-11-23 DIAGNOSIS — Z9841 Cataract extraction status, right eye: Secondary | ICD-10-CM | POA: Diagnosis not present

## 2017-11-23 DIAGNOSIS — S80821A Blister (nonthermal), right lower leg, initial encounter: Secondary | ICD-10-CM | POA: Diagnosis present

## 2017-11-23 DIAGNOSIS — Z9842 Cataract extraction status, left eye: Secondary | ICD-10-CM | POA: Diagnosis not present

## 2017-11-23 DIAGNOSIS — G20 Parkinson's disease: Secondary | ICD-10-CM | POA: Diagnosis not present

## 2017-11-23 DIAGNOSIS — E1122 Type 2 diabetes mellitus with diabetic chronic kidney disease: Secondary | ICD-10-CM | POA: Diagnosis not present

## 2017-11-23 DIAGNOSIS — Z91048 Other nonmedicinal substance allergy status: Secondary | ICD-10-CM | POA: Diagnosis not present

## 2017-11-23 DIAGNOSIS — G2581 Restless legs syndrome: Secondary | ICD-10-CM | POA: Diagnosis present

## 2017-11-23 DIAGNOSIS — J690 Pneumonitis due to inhalation of food and vomit: Secondary | ICD-10-CM

## 2017-11-23 DIAGNOSIS — E559 Vitamin D deficiency, unspecified: Secondary | ICD-10-CM | POA: Diagnosis present

## 2017-11-23 DIAGNOSIS — Z8249 Family history of ischemic heart disease and other diseases of the circulatory system: Secondary | ICD-10-CM | POA: Diagnosis not present

## 2017-11-23 DIAGNOSIS — E039 Hypothyroidism, unspecified: Secondary | ICD-10-CM | POA: Diagnosis present

## 2017-11-23 DIAGNOSIS — X58XXXA Exposure to other specified factors, initial encounter: Secondary | ICD-10-CM | POA: Diagnosis present

## 2017-11-23 DIAGNOSIS — E43 Unspecified severe protein-calorie malnutrition: Secondary | ICD-10-CM | POA: Diagnosis present

## 2017-11-23 DIAGNOSIS — R1313 Dysphagia, pharyngeal phase: Secondary | ICD-10-CM | POA: Diagnosis present

## 2017-11-23 DIAGNOSIS — E7849 Other hyperlipidemia: Secondary | ICD-10-CM | POA: Diagnosis present

## 2017-11-23 DIAGNOSIS — Y92002 Bathroom of unspecified non-institutional (private) residence single-family (private) house as the place of occurrence of the external cause: Secondary | ICD-10-CM | POA: Diagnosis not present

## 2017-11-23 DIAGNOSIS — G232 Striatonigral degeneration: Secondary | ICD-10-CM | POA: Diagnosis not present

## 2017-11-23 DIAGNOSIS — T24231A Burn of second degree of right lower leg, initial encounter: Secondary | ICD-10-CM | POA: Diagnosis present

## 2017-11-23 DIAGNOSIS — G214 Vascular parkinsonism: Secondary | ICD-10-CM | POA: Diagnosis not present

## 2017-11-23 DIAGNOSIS — Z823 Family history of stroke: Secondary | ICD-10-CM | POA: Diagnosis not present

## 2017-11-23 DIAGNOSIS — W19XXXA Unspecified fall, initial encounter: Secondary | ICD-10-CM | POA: Diagnosis not present

## 2017-11-23 DIAGNOSIS — E538 Deficiency of other specified B group vitamins: Secondary | ICD-10-CM | POA: Diagnosis present

## 2017-11-23 DIAGNOSIS — E1129 Type 2 diabetes mellitus with other diabetic kidney complication: Secondary | ICD-10-CM | POA: Diagnosis present

## 2017-11-23 DIAGNOSIS — Z87891 Personal history of nicotine dependence: Secondary | ICD-10-CM | POA: Diagnosis not present

## 2017-11-23 DIAGNOSIS — J9601 Acute respiratory failure with hypoxia: Secondary | ICD-10-CM | POA: Diagnosis not present

## 2017-11-23 DIAGNOSIS — E1143 Type 2 diabetes mellitus with diabetic autonomic (poly)neuropathy: Secondary | ICD-10-CM | POA: Diagnosis present

## 2017-11-23 DIAGNOSIS — I452 Bifascicular block: Secondary | ICD-10-CM | POA: Diagnosis present

## 2017-11-23 DIAGNOSIS — R55 Syncope and collapse: Secondary | ICD-10-CM | POA: Diagnosis not present

## 2017-11-23 DIAGNOSIS — R9389 Abnormal findings on diagnostic imaging of other specified body structures: Secondary | ICD-10-CM | POA: Diagnosis not present

## 2017-11-23 DIAGNOSIS — N182 Chronic kidney disease, stage 2 (mild): Secondary | ICD-10-CM

## 2017-11-23 DIAGNOSIS — R748 Abnormal levels of other serum enzymes: Secondary | ICD-10-CM | POA: Diagnosis not present

## 2017-11-23 DIAGNOSIS — Z6820 Body mass index (BMI) 20.0-20.9, adult: Secondary | ICD-10-CM | POA: Diagnosis not present

## 2017-11-23 DIAGNOSIS — R1312 Dysphagia, oropharyngeal phase: Secondary | ICD-10-CM | POA: Diagnosis not present

## 2017-11-23 LAB — BASIC METABOLIC PANEL
Anion gap: 6 (ref 5–15)
BUN: 22 mg/dL (ref 8–23)
CHLORIDE: 104 mmol/L (ref 98–111)
CO2: 27 mmol/L (ref 22–32)
CREATININE: 1.04 mg/dL (ref 0.61–1.24)
Calcium: 8.7 mg/dL — ABNORMAL LOW (ref 8.9–10.3)
GFR calc Af Amer: >60 mL/min (ref >60)
GFR calc non Af Amer: >60 mL/min (ref >60)
GLUCOSE: 119 mg/dL — AB (ref 70–99)
Potassium: 3.5 mmol/L (ref 3.5–5.1)
SODIUM: 137 mmol/L (ref 135–145)

## 2017-11-23 LAB — CBC
HCT: 38 % — ABNORMAL LOW (ref 39.0–52.0)
HEMOGLOBIN: 12.2 g/dL — AB (ref 13.0–17.0)
MCH: 31.2 pg (ref 26.0–34.0)
MCHC: 32.1 g/dL (ref 30.0–36.0)
MCV: 97.2 fL (ref 78.0–100.0)
Platelets: 228 10*3/uL (ref 150–400)
RBC: 3.91 MIL/uL — ABNORMAL LOW (ref 4.22–5.81)
RDW: 13.8 % (ref 11.5–15.5)
WBC: 9.3 10*3/uL (ref 4.0–10.5)

## 2017-11-23 LAB — GLUCOSE, CAPILLARY
GLUCOSE-CAPILLARY: 115 mg/dL — AB (ref 70–99)
GLUCOSE-CAPILLARY: 169 mg/dL — AB (ref 70–99)
Glucose-Capillary: 94 mg/dL (ref 70–99)
Glucose-Capillary: 216 mg/dL — ABNORMAL HIGH (ref 70–99)

## 2017-11-23 MED ORDER — ADULT MULTIVITAMIN W/MINERALS CH
1.0000 | ORAL_TABLET | Freq: Every day | ORAL | Status: DC
  Administered 2017-11-23 – 2017-11-24 (×2): 1 via ORAL
  Filled 2017-11-23 (×2): qty 1

## 2017-11-23 MED ORDER — AMOXICILLIN-POT CLAVULANATE 875-125 MG PO TABS
1.0000 | ORAL_TABLET | Freq: Two times a day (BID) | ORAL | Status: DC
  Administered 2017-11-23 – 2017-11-24 (×3): 1 via ORAL
  Filled 2017-11-23 (×3): qty 1

## 2017-11-23 MED ORDER — ENSURE ENLIVE PO LIQD
237.0000 mL | Freq: Three times a day (TID) | ORAL | Status: DC
  Administered 2017-11-24: 237 mL via ORAL

## 2017-11-23 NOTE — Progress Notes (Signed)
MD verbal order to hold oral medications until pt is fully alert.

## 2017-11-23 NOTE — Progress Notes (Addendum)
Initial Nutrition Assessment  DOCUMENTATION CODES:   Severe malnutrition in context of chronic illness  INTERVENTION:   -Ensure Enlive po TID, each supplement provides 350 kcal and 20 grams of protein -MVI with minerals daily  NUTRITION DIAGNOSIS:   Severe Malnutrition related to chronic illness(Parkinson's) as evidenced by energy intake < or equal to 75% for > or equal to 1 month, moderate fat depletion, severe fat depletion, moderate muscle depletion, severe muscle depletion.  GOAL:   Patient will meet greater than or equal to 90% of their needs  MONITOR:   PO intake, Supplement acceptance, Labs, Weight trends, Skin, I & O's  REASON FOR ASSESSMENT:   Malnutrition Screening Tool, Consult Assessment of nutrition requirement/status  ASSESSMENT:   Nicholas Barnett is a 84 y.o. male with history of Parkinson's disease, gait difficulty, hypertension, diabetes mellitus, dysphagia, B12 deficiency, bifascicular block was brought to the ER after patient had a unwitnessed syncopal episode at home.  Family found patient lying on the floor and was brought to the ER.  Patient does not recall the event.  Did not have any incontinence of urine or tongue bite.  Did not have any chest pain or shortness of breath nausea vomiting or diarrhea.  No change in his medications recently.  Pt admitted with syncope.   9/30- s/p BSE- recommend regular diet with thin liquids  Case discussed with RN prior to visit, who reports appetite is minimal, but pt is consuming Ensure supplements well. He is on a dysphagia 3 diet.   Spoke with pt, wife, and daughter at bedside, who reports a general decline in health prior to Parkinson's diagnosis in 2014. Prior ot diagnosis, pt wife reports pt was a great eater and weighed about 250# at baseline. Since diagnosis, pt has had ongoing weight loss. Per daughter, pt has experienced a 10# weight loss at PCP appointment approximately 6 months ago. Per weight history, pt has  experienced a 7% wt loss over the past 6 months, which while not significant is concerning in the presence of continued poor oral intake.   Pt wife and daughter share that pt is a very selective eater. He typically does not arise until about noon daily, when he consumes 1-2 meals and multiple snacks throughout the day. Pt also consumes 1 Ensure supplement daily. Per pt wife, meal choices are limited- pt's favorite foods include hot and sour soup and steak. Wife and three daughter take turns preparing meals for pt. Observed breakfast tray at bedside- pt consumed about 25% of orange juice and 100% of muffin. Pt also had Ensure supplement prior to visit.   Discussed with pt and family importance of good meal and supplement intake to promote healing. They are amenable to offering Ensure supplements more often to assist in improving caloric intake. Discussed options of different supplements that pt can access (ex Equate Nutritional Drink Plus). Pt will likely d/c home tomorrow with home health services.  Pt with poor oral intake and would benefit from nutrient dense supplement. One Ensure Enlive supplement provides 350 kcals, 20 grams protein, and 44-45 grams of carbohydrate vs one Glucerna shake supplement, which provides 220 kcals, 10 grams of protein, and 26 grams of carbohydrate. Given pt's hx of DM, RD will continue to monitor PO intake, CBGS, and adjust supplement regimen as appropriate.   Last Hgb A1c: 5.7 (11/02/17). PTA DM medications are 1000 mg metformin BID.   Labs reviewed: CBGS: 94-152 (inpatient orders for glycemic control are 0-9 units insulin aspart TID with  meals).   NUTRITION - FOCUSED PHYSICAL EXAM:    Most Recent Value  Orbital Region  Severe depletion  Upper Arm Region  Moderate depletion  Thoracic and Lumbar Region  Moderate depletion  Buccal Region  Moderate depletion  Temple Region  Severe depletion  Clavicle Bone Region  Moderate depletion  Clavicle and Acromion Bone Region   Moderate depletion  Scapular Bone Region  Moderate depletion  Dorsal Hand  Severe depletion  Patellar Region  Mild depletion  Anterior Thigh Region  Mild depletion  Posterior Calf Region  Mild depletion  Edema (RD Assessment)  Mild  Hair  Reviewed  Eyes  Reviewed  Mouth  Reviewed  Skin  Reviewed  Nails  Reviewed       Diet Order:   Diet Order            DIET DYS 3 Room service appropriate? Yes; Fluid consistency: Thin  Diet effective now              EDUCATION NEEDS:   Education needs have been addressed  Skin:  Skin Assessment: Reviewed RN Assessment  Last BM:  PTA  Height:   Ht Readings from Last 1 Encounters:  11/04/17 6\' 1"  (1.854 m)    Weight:   Wt Readings from Last 1 Encounters:  11/23/17 68.7 kg    Ideal Body Weight:  83.6 kg  BMI:  Body mass index is 19.98 kg/m.  Estimated Nutritional Needs:   Kcal:  1800-2000  Protein:  90-105 grams  Fluid:  >1.8 L    Ramiyah Mcclenahan A. Mayford Knife, RD, LDN, CDE Pager: [phone] After hours Pager: [phone]

## 2017-11-23 NOTE — Evaluation (Signed)
Physical Therapy Evaluation Patient Details Name: Nicholas Barnett MRN: [id number] DOB: 11-03-1931 Today's Date: 11/23/2017   History of Present Illness  84yo male brought to the ED by family secondary to unwitnessed syncopal episode at home. CT cervical spine negative for acute changes, elevated troponins found in ED. PMH parkinsonism, gait difficulty, HTN, DM, dysphagia, bifascicular block, HOH, lumbar/cervical pain   Clinical Impression   Patient received asleep in bed, daughter present and assisted in provided information regarding PLOF, equipment, and mobility; daughter reports that function varies by day and time of day, he can transfer only, and is cared for primarily by his wife. Patient very lethargic and somnolent but able to rouse to participate in PT evaluation this morning. He requires totalA for all functional bed mobility and demonstrates strong posterior lean at EOB requiring ModA to maintain upright, very little effort noted during bed mobility or to hold self upright at EOB. Unable to safely progress mobility today with assist of just +1, he will require +2 for all further attempts at mobility. He was left in bed with bed alarm activated, all other needs met, family present. He may benefit from skilled PT services in the acute setting as well as ST-SNF moving forward; if patient or family were to refuse SNF, he would require skilled HHPT, a hoyer lift, and would also strongly recommend home aide to reduce caregiver burden.     Follow Up Recommendations SNF;Other (comment)(if patient/family refuse SNF, he will require HHPT, hoyer lift, and strongly recommend home aide )    Equipment Recommendations  3in1 (PT);Other (comment)(hoyer lift )    Recommendations for Other Services       Precautions / Restrictions Precautions Precautions: Fall Restrictions Weight Bearing Restrictions: No      Mobility  Bed Mobility Overal bed mobility: Needs Assistance Bed Mobility:  Rolling;Supine to Sit;Sit to Supine Rolling: Total assist   Supine to sit: Total assist Sit to supine: Total assist   General bed mobility comments: totalA for all functional mobility with little to no initiation or effort noted from patient, strong posterior lean at EOB , ModA to maintain upright midline at EOB   Transfers                 General transfer comment: unable to safely attempt with just +1 assist   Ambulation/Gait             General Gait Details: unable to safely attempt with just +1 assist   Stairs            Wheelchair Mobility    Modified Rankin (Stroke Patients Only)       Balance Overall balance assessment: Needs assistance;History of Falls Sitting-balance support: Feet supported;Bilateral upper extremity supported Sitting balance-Leahy Scale: Poor       Standing balance-Leahy Scale: Zero                               Pertinent Vitals/Pain Pain Assessment: Faces Pain Score: 0-No pain Faces Pain Scale: No hurt Pain Intervention(s): Limited activity within patient's tolerance;Monitored during session    [facility] expects to be discharged to:: Private residence Living Arrangements: Spouse/significant other Available Help at Discharge: Available 24 hours/day;Family Type of Home: House Home Access: Ramped entrance     Home Layout: Two level;Able to live on main level with bedroom/bathroom Home Equipment: Gilford Rile - 2 wheels;Walker - 4 wheels;Crutches;Shower seat;Electric scooter;Transport chair;Hospital bed Additional Comments: per daughter,  mostly only transfers and level of assist varies by the day and time of day    Prior Function Level of Independence: Needs assistance   Gait / Transfers Assistance Needed: transferes only, uses electric scooter   ADL's / Homemaking Assistance Needed: assist for ADLs        Hand Dominance        Extremity/Trunk Assessment   Upper Extremity  Assessment Upper Extremity Assessment: Defer to OT evaluation    Lower Extremity Assessment Lower Extremity Assessment: Generalized weakness    Cervical / Trunk Assessment Cervical / Trunk Assessment: Kyphotic  Communication   Communication: HOH;Expressive difficulties  Cognition Arousal/Alertness: Lethargic Behavior During Therapy: Flat affect Overall Cognitive Status: Difficult to assess                                 General Comments: very lethargic during eval with minimal participation with PT, very [place] Comments      Exercises     Assessment/Plan    PT Assessment Patient needs continued PT services  PT Problem List Decreased strength;Decreased mobility;Decreased safety awareness;Decreased coordination;Decreased activity tolerance;Decreased balance       PT Treatment Interventions DME instruction;Therapeutic activities;Gait training;Therapeutic exercise;Patient/family education;Stair training;Balance training;Functional mobility training;Neuromuscular re-education    PT Goals (Current goals can be found in the Care Plan section)  Acute Rehab PT Goals Patient Stated Goal: to reduce caregiver burden  PT Goal Formulation: With family Time For Goal Achievement: 12/07/17 Potential to Achieve Goals: Fair    Frequency Min 2X/week   Barriers to discharge        Co-evaluation               AM-PAC PT "6 Clicks" Daily Activity  Outcome Measure Difficulty turning over in bed (including adjusting bedclothes, sheets and blankets)?: Unable Difficulty moving from lying on back to sitting on the side of the bed? : Unable Difficulty sitting down on and standing up from a chair with arms (e.g., wheelchair, bedside commode, etc,.)?: Unable Help needed moving to and from a bed to chair (including a wheelchair)?: Total Help needed walking in hospital room?: Total Help needed climbing 3-5 steps with a railing? : Total 6 Click Score: 6     End of Session Equipment Utilized During Treatment: Oxygen Activity Tolerance: Patient limited by lethargy Patient left: in bed;with bed alarm set;with call bell/phone within reach;with family/visitor present Nurse Communication: Mobility status PT Visit Diagnosis: Muscle weakness (generalized) (M62.81);Unsteadiness on feet (R26.81);History of falling (Z91.81);Difficulty in walking, not elsewhere classified (R26.2)    Time: 1115-5208 PT Time Calculation (min) (ACUTE ONLY): 24 min   Charges:   PT Evaluation $PT Eval Moderate Complexity: 1 Mod PT Treatments $Therapeutic Activity: 8-22 mins        Deniece Ree PT, DPT, CBIS  Supplemental Physical Therapist [place]    Pager [phone] Acute Rehab Office [phone]

## 2017-11-23 NOTE — Progress Notes (Signed)
RN notified RT that MD does not need pt to be suctioned through the nose; only through the mouth. RT states he will notify Morrie Sheldon, RT.

## 2017-11-23 NOTE — Clinical Social Work Note (Signed)
Per RNCM, plan is for patient to return home at discharge.   CSW signing off. Consult again if any social work needs arise.  Charlynn Court, CSW [phone]

## 2017-11-23 NOTE — Progress Notes (Signed)
Admission RN states she will see pt. 

## 2017-11-23 NOTE — Progress Notes (Signed)
Progress Note    Nicholas MARIO  Barnett:[id number] DOB: [birth year]  DOA: 11/21/2017 PCP: [name], DO  Neurology: Tat   Brief Narrative:   Chief complaint: altered mental status  Nicholas Barnett is an 84 y.o. male with pmh of Parkinson's disease with progressive decline over the last several months requiring assistance for all ADLs, memory loss, htn, hyperlipidemia, DM2, chronic dysphagia and b12 deficiency admitted last evening for unwitnessed syncopal event. Wife came home from church and found pt passed out in the shower with water still running. He was able to communicate with wife at time with nods and mentation improved by the time he arrived to ED. Pt remembers getting in shower, but no recollection of syncopal event. No bowel incontinence or tongue bite. Denies having any chest pain, palpitations, sob, n/v/d, recent fever or chills.   ED work-up significant for possible infiltrate on chest xray and mild elevation of troponin. Cardiology asked to consult.   Assessment/Plan:   Principal Problem:   Syncope Active Problems:   Essential hypertension   Parkinson's plus syndrome (HCC)   Restless legs syndrome (RLS)   Type II diabetes mellitus with renal manifestations (HCC)   Syncope -unclear etiology. Pt denies any chest pain. Could be related to Parkinsonism and recent decline and hot shower -MRI brain no acute process, chronic ischemic changes, remote infarcts -No PE or aortic dissection on CT angio -no events overnight on telemetry-- continue to monitor -mild elevation in trop 0.25>>0.64>>0.48. EKG NSR, bigeminy. Cardiology following -per cards: will arrange for 2 week event monitor upon d/c -echo preserved EF  Elevated troponin -see above. Pt denies any chest pain.  -neg cardiac w/u in 2016  Possible aspiration with known pharyngeal dysphagia -WBC improved on Unasyn D2.  -repeat SLP eval done today revealing pt is severe aspiration risk and recommending regular  diet with thin liquids. -pulm toilet -change to Augmentin and monitor for response  Blistering of skin -right calf, medial aspect with blistering. Could be related to water temp/synocopal event -no s/sxs of infection -dressing, monitor  Parkinsons disease  -Progressive decline over the last several months per daughter. Wife acts as primary caretaker and pt requiring assistance for all ADL's.  -continue Sinemet, Remeron -will arrange home health PT/aide and hoyer lift in hopes to help the family better care for him at home -mentioned a possible eval by hospice if continues to decline  Hypertension -mild elevation -continue ACEI with prn hydralazine and monitor  DM2 -holding Metformin while inpt -will order SSI  Family Communication/Anticipated D/C date and plan/Code Status   DVT prophylaxis: SQ Lovenox Code Status: DNR Family Communication: spoke with daughter at bedside Disposition Plan: if continues to improve-- home with home health in AM   Medical Consultants:    Cardiology   Anti-Infectives:    Unasyn -- changed to augmentin  Subjective:   Patient sleeping--- does not usually get up before noon  Objective:    Vitals:   11/23/17 0435 11/23/17 0800 11/23/17 1138 11/23/17 1243  BP: (!) 142/71 139/72 (!) 139/91   Pulse: 69 70 62   Resp: 18  16   Temp: 98 F (36.7 C)  98 F (36.7 C)   TempSrc: Oral  Oral   SpO2: 96%  95% 93%  Weight: 68.7 kg       Intake/Output Summary (Last 24 hours) at 11/23/2017 1427 Last data filed at 11/23/2017 0534 Gross per 24 hour  Intake 1479.06 ml  Output 650 ml  Net 829.06 ml   Filed Weights   11/22/17 0212 11/23/17 0435  Weight: 67.1 kg 68.7 kg    Exam: General: sleeping soundly Resp: upper airway sounds, no wheezing, no O2 currently GI:+BS, soft Right lower leg with now deflated blister and small skin tear  Data Reviewed:   I have personally reviewed following labs and imaging studies:  Labs: Labs show the  following:   Basic Metabolic Panel: Recent Labs  Lab 11/21/17 1648 11/21/17 2123 11/22/17 0232 11/23/17 0433  NA 142  --  141 137  K 3.8  --  3.7 3.5  CL 105  --  104 104  CO2 24  --  24 27  GLUCOSE 132*  --  99 119*  BUN 21  --  22 22  CREATININE 1.10 1.08 1.07 1.04  CALCIUM 9.9  --  9.5 8.7*   GFR Estimated Creatinine Clearance: 51.4 mL/min (by C-G formula based on SCr of 1.04 mg/dL). Liver Function Tests: Recent Labs  Lab 11/21/17 1648  AST 27  ALT 15  ALKPHOS 63  BILITOT 1.1  PROT 7.1  ALBUMIN 3.9   No results for input(s): LIPASE, AMYLASE in the last 168 hours. No results for input(s): AMMONIA in the last 168 hours. Coagulation profile No results for input(s): INR, PROTIME in the last 168 hours.  CBC: Recent Labs  Lab 11/21/17 1648 11/22/17 0232 11/23/17 0433  WBC 16.5* 12.2* 9.3  NEUTROABS 15.1*  --   --   HGB 16.0 13.9 12.2*  HCT 48.0 42.5 38.0*  MCV 95.0 95.5 97.2  PLT 281 277 228   Cardiac Enzymes: Recent Labs  Lab 11/21/17 1648 11/21/17 2123 11/22/17 0232 11/22/17 0907  TROPONINI 0.04* 0.25* 0.64* 0.48*   BNP (last 3 results) No results for input(s): PROBNP in the last 8760 hours. CBG: Recent Labs  Lab 11/22/17 1251 11/22/17 1634 11/22/17 2059 11/23/17 0741 11/23/17 1134  GLUCAP 120* 135* 152* 115* 94   D-Dimer: No results for input(s): DDIMER in the last 72 hours. Hgb A1c: No results for input(s): HGBA1C in the last 72 hours. Lipid Profile: No results for input(s): CHOL, HDL, LDLCALC, TRIG, CHOLHDL, LDLDIRECT in the last 72 hours. Thyroid function studies: Recent Labs    11/21/17 2020  TSH 0.990   Anemia work up: No results for input(s): VITAMINB12, FOLATE, FERRITIN, TIBC, IRON, RETICCTPCT in the last 72 hours. Sepsis Labs: Recent Labs  Lab 11/21/17 1648 11/22/17 0232 11/23/17 0433  WBC 16.5* 12.2* 9.3    Microbiology Recent Results (from the past 240 hour(s))  Urine culture     Status: Abnormal (Preliminary  result)   Collection Time: 11/21/17 10:42 PM  Result Value Ref Range Status   Specimen Description URINE, RANDOM  Final   Special Requests   Final    NONE Performed at [facility], [address] [place], [place] [address]    Culture 80,000 COLONIES/mL STAPHYLOCOCCUS EPIDERMIDIS (A)  Final   Report Status PENDING  Incomplete    Procedures and diagnostic studies:  Dg Chest 2 View  Result Date: 11/21/2017 CLINICAL DATA:  Abnormal chest radiograph EXAM: CHEST - 2 VIEW COMPARISON:  11/21/2017 at 1532 hours FINDINGS: Persistent medial right upper lobe opacity. Additional mild patchy opacity overlying the right mid lung. This appearance favors multifocal infection/pneumonia. Left lung is essentially clear. No pleural effusion or pneumothorax. The heart is normal in size. IMPRESSION: Mild patchy right lung opacities, favoring multifocal infection/pneumonia. Follow-up is suggested to document clearance. Electronically Signed  By: Charline Bills M.D.   On: 11/21/2017 22:23   Ct Head Wo Contrast  Result Date: 11/21/2017 CLINICAL DATA:  Syncope. EXAM: CT HEAD WITHOUT CONTRAST CT CERVICAL SPINE WITHOUT CONTRAST TECHNIQUE: Multidetector CT imaging of the head and cervical spine was performed following the standard protocol without intravenous contrast. Multiplanar CT image reconstructions of the cervical spine were also generated. COMPARISON:  None. FINDINGS: CT HEAD FINDINGS Brain: Moderate diffuse cortical atrophy is noted. Mild chronic ischemic white matter disease is noted. No mass effect or midline shift is noted. Ventricular size is within normal limits. There is no evidence of mass lesion, hemorrhage or acute infarction. Vascular: No hyperdense vessel or unexpected calcification. Skull: Normal. Negative for fracture or focal lesion. Sinuses/Orbits: No acute finding. Other: None. CT CERVICAL SPINE FINDINGS Alignment: Normal. Skull base and vertebrae: No acute fracture. No primary bone lesion  or focal pathologic process. Soft tissues and spinal canal: No prevertebral fluid or swelling. No visible canal hematoma. Disc levels: Moderate degenerative disc disease is noted at C6-7 and C7-T1. Upper chest: Negative. Other: Degenerative changes are seen involving posterior facet joints bilaterally. IMPRESSION: Moderate diffuse cortical atrophy. Mild chronic ischemic white matter disease. No acute intracranial abnormality seen. Multilevel degenerative disc disease. No acute abnormality seen in the cervical spine. Electronically Signed   By: Lupita Raider, M.D.   On: 11/21/2017 18:32   Ct Cervical Spine Wo Contrast  Result Date: 11/21/2017 CLINICAL DATA:  Syncope. EXAM: CT HEAD WITHOUT CONTRAST CT CERVICAL SPINE WITHOUT CONTRAST TECHNIQUE: Multidetector CT imaging of the head and cervical spine was performed following the standard protocol without intravenous contrast. Multiplanar CT image reconstructions of the cervical spine were also generated. COMPARISON:  None. FINDINGS: CT HEAD FINDINGS Brain: Moderate diffuse cortical atrophy is noted. Mild chronic ischemic white matter disease is noted. No mass effect or midline shift is noted. Ventricular size is within normal limits. There is no evidence of mass lesion, hemorrhage or acute infarction. Vascular: No hyperdense vessel or unexpected calcification. Skull: Normal. Negative for fracture or focal lesion. Sinuses/Orbits: No acute finding. Other: None. CT CERVICAL SPINE FINDINGS Alignment: Normal. Skull base and vertebrae: No acute fracture. No primary bone lesion or focal pathologic process. Soft tissues and spinal canal: No prevertebral fluid or swelling. No visible canal hematoma. Disc levels: Moderate degenerative disc disease is noted at C6-7 and C7-T1. Upper chest: Negative. Other: Degenerative changes are seen involving posterior facet joints bilaterally. IMPRESSION: Moderate diffuse cortical atrophy. Mild chronic ischemic white matter disease. No  acute intracranial abnormality seen. Multilevel degenerative disc disease. No acute abnormality seen in the cervical spine. Electronically Signed   By: Lupita Raider, M.D.   On: 11/21/2017 18:32   Mr Brain Wo Contrast  Result Date: 11/22/2017 CLINICAL DATA:  Syncopal episode, found down in bathroom. Altered level of consciousness. History of Parkinson's disease, recurrent falls, hypertension and diabetes. EXAM: MRI HEAD WITHOUT CONTRAST TECHNIQUE: Multiplanar, multiecho pulse sequences of the brain and surrounding structures were obtained without intravenous contrast. COMPARISON:  CT HEAD November 21, 2017 and MRI head November 21, 2013. FINDINGS: INTRACRANIAL CONTENTS: No reduced diffusion to suggest acute ischemia. No susceptibility artifact to suggest hemorrhage. Moderate to severe ventriculomegaly with narrowed callosum angle and sulcal effacement at the convexities. Symmetric basal ganglia mineralization. Susceptibility artifact within bilateral parietal and LEFT occipital lobe encephalomalacia. Old RIGHT thalamus lacunar infarct. Patchy supratentorial white matter FLAIR T2 hyperintensities exclusive aforementioned abnormality. No midline shift. Similar prominent bifrontal extra-axial spaces without extra-axial fluid collection.  VASCULAR: Normal major intracranial vascular flow voids present at skull base. SKULL AND UPPER CERVICAL SPINE: No abnormal sellar expansion. No suspicious calvarial bone marrow signal. Craniocervical junction maintained. SINUSES/ORBITS: The mastoid air-cells and included paranasal sinuses are well-aerated.The included ocular globes and orbital contents are non-suspicious. Status post bilateral ocular lens implants. OTHER: None. IMPRESSION: 1. No acute intracranial process. 2. Old small biparietal and LEFT occipital lobe infarcts, possible old PRES or TBI. 3. Moderate to severe parenchymal brain volume loss and imaging findings of normal pressure hydrocephalus. 4. Mild chronic  small vessel ischemic changes. Old RIGHT thalamus lacunar infarct. Electronically Signed   By: Awilda Metro M.D.   On: 11/22/2017 06:37   Dg Pelvis Portable  Result Date: 11/21/2017 CLINICAL DATA:  Patient found down. EXAM: PORTABLE PELVIS 1-2 VIEWS COMPARISON:  None. FINDINGS: There is no evidence of pelvic fracture or diastasis. No pelvic bone lesions are seen. IMPRESSION: Negative. Electronically Signed   By: Gerome Sam III M.D   On: 11/21/2017 16:32   Dg Chest Portable 1 View  Result Date: 11/21/2017 CLINICAL DATA:  Change in mental status EXAM: PORTABLE CHEST 1 VIEW COMPARISON:  June 21, 2017 FINDINGS: Subtle haziness over the right upper lung is thought to be artifactual. Stable tortuous thoracic aorta. The heart, hila, mediastinum, lungs, and pleura are otherwise normal. IMPRESSION: Subtle haziness over the right apex is favored to be artifactual rather than subtle infiltrate. A PA and lateral chest x-ray could further evaluate if clinically warranted. Electronically Signed   By: Gerome Sam III M.D   On: 11/21/2017 16:31   Dg Foot Complete Left  Result Date: 11/21/2017 CLINICAL DATA:  Patient found on floor bathroom. EXAM: LEFT FOOT - COMPLETE 3+ VIEW COMPARISON:  None. FINDINGS: Vascular calcifications.  No fractures identified. IMPRESSION: Negative. Electronically Signed   By: Gerome Sam III M.D   On: 11/21/2017 16:32    Medications:   . amoxicillin-clavulanate  1 tablet Oral Q12H  . aspirin EC  81 mg Oral Daily  . benazepril  5 mg Oral Daily  . calcium-vitamin D  1 tablet Oral BID WC  . carbidopa-levodopa  3 tablet Oral 2 times per day  . enoxaparin (LOVENOX) injection  40 mg Subcutaneous Q24H  . feeding supplement (ENSURE ENLIVE)  237 mL Oral TID BM  . gabapentin  300 mg Oral QHS  . insulin aspart  0-9 Units Subcutaneous TID WC  . mirtazapine  30 mg Oral QHS  . multivitamin with minerals  1 tablet Oral Daily  . pantoprazole  40 mg Oral BID  . polycarbophil   625 mg Oral QHS  . pravastatin  20 mg Oral QHS  . pyridOXINE  100 mg Oral Daily  . tamsulosin  0.4 mg Oral QHS   Continuous Infusions:    LOS: 0 days   Marlin Canary DO [facility]     *Please refer to [email or web address], password [email or web address] to get updated schedule on who will round on this patient, as hospitalists switch teams weekly. If 7PM-7AM, please contact night-coverage at [email or web address], password [email or web address] for any overnight needs.  11/23/2017, 2:27 PM

## 2017-11-23 NOTE — Evaluation (Signed)
Physical Therapy Re-Evaluation Patient Details Name: Nicholas Barnett MRN: [id number] DOB: [birth year] Today's Date: 11/23/2017   History of Present Illness  84yo male brought to the ED by family secondary to unwitnessed syncopal episode at home. CT cervical spine negative for acute changes, elevated troponins found in ED. PMH parkinsonism, gait difficulty, HTN, DM, dysphagia, bifascicular block, HOH, lumbar/cervical pain   Clinical Impression  Re-evaluation performed this afternoon per MD/CM request as patient is much more alert this afternoon. Wife present and also able to provide more information regarding functional status at home, equipment, etc. Patient very pleasant and much more alert, able to follow simple commands with increased time but continues to require heavy levels of assist for mobility, including MaxA for supine to sit/sit to supine and totalA for sit to stand transfer. Note strong posterior and R lean at EOB making it difficult to safely perform sit to stand transfer today. Did not attempt stand-pivot due to safety concerns with considerable lean/poor balance with just +1 assist today. He was left in bed with all needs met, alarm activated, and family present. With clarification from patient/family on function/assist at home and seeing patient more alert/participatory, feel that he would be able to return home with skilled HHPT services, hoyer lift, and home health aide at this point. Family and patient agreeable.     Follow Up Recommendations Home health PT;Other (comment)(will require Hoyer lift and home health aide )    Equipment Recommendations  3in1 (PT);Other (comment)(hoyer lift )    Recommendations for Other Services       Precautions / Restrictions        Mobility  Bed Mobility Overal bed mobility: Needs Assistance Bed Mobility: Supine to Sit;Sit to Supine     Supine to sit: Max assist Sit to supine: Max assist   General bed mobility comments: MaxA for all  functional mobiltiy but able to follow simple commands consistently and with increased time; strong posterior and right lean sitting EOB   Transfers Overall transfer level: Needs assistance Equipment used: 1 person hand held assist Transfers: Sit to/from Stand Sit to Stand: Total assist         General transfer comment: totalA for sit to stand transfer with Max cues to reduce posterior and R lean; did not attempt stand-pivot due to safety concerns with assist of +1   Ambulation/Gait             General Gait Details: unable to safely attempt with just +1 assist, non-ambulatory at baseline   Stairs            Wheelchair Mobility    Modified Rankin (Stroke Patients Only)       Balance Overall balance assessment: Needs assistance;History of Falls Sitting-balance support: Feet supported;Bilateral upper extremity supported Sitting balance-Leahy Scale: Poor     Standing balance support: During functional activity Standing balance-Leahy Scale: Zero                               Pertinent Vitals/Pain Pain Assessment: Faces Pain Score: 0-No pain Faces Pain Scale: No hurt Pain Intervention(s): Limited activity within patient's tolerance;Monitored during session    Home Living                        Prior Function                 Hand Dominance  Extremity/Trunk Assessment   Upper Extremity Assessment Upper Extremity Assessment: Generalized weakness    Lower Extremity Assessment Lower Extremity Assessment: Generalized weakness    Cervical / Trunk Assessment Cervical / Trunk Assessment: Kyphotic  Communication      Cognition Arousal/Alertness: Awake/alert Behavior During Therapy: Flat affect Overall Cognitive Status: Within Functional Limits for tasks assessed                                 General Comments: much more awake and alert this afternoon, able to follow commands and interact with PT        General Comments      Exercises     Assessment/Plan    PT Assessment Patient needs continued PT services  PT Problem List Decreased strength;Decreased mobility;Decreased safety awareness;Decreased coordination;Decreased activity tolerance;Decreased balance       PT Treatment Interventions DME instruction;Therapeutic activities;Gait training;Therapeutic exercise;Patient/family education;Stair training;Balance training;Functional mobility training;Neuromuscular re-education    PT Goals (Current goals can be found in the Care Plan section)  Acute Rehab PT Goals Patient Stated Goal: to reduce caregiver burden  PT Goal Formulation: With patient/family Time For Goal Achievement: 12/07/17 Potential to Achieve Goals: Fair    Frequency Min 3X/week   Barriers to discharge        Co-evaluation               AM-PAC PT "6 Clicks" Daily Activity  Outcome Measure Difficulty turning over in bed (including adjusting bedclothes, sheets and blankets)?: Unable Difficulty moving from lying on back to sitting on the side of the bed? : Unable Difficulty sitting down on and standing up from a chair with arms (e.g., wheelchair, bedside commode, etc,.)?: Unable Help needed moving to and from a bed to chair (including a wheelchair)?: Total Help needed walking in hospital room?: Total Help needed climbing 3-5 steps with a railing? : Total 6 Click Score: 6    End of Session Equipment Utilized During Treatment: Gait belt Activity Tolerance: Patient tolerated treatment well Patient left: in bed;with call bell/phone within reach;with bed alarm set;with family/visitor present Nurse Communication: Mobility status PT Visit Diagnosis: Muscle weakness (generalized) (M62.81);Unsteadiness on feet (R26.81);History of falling (Z91.81);Difficulty in walking, not elsewhere classified (R26.2)    Time: 1610-9604 PT Time Calculation (min) (ACUTE ONLY): 23 min   Charges:   PT Evaluation $PT  Re-evaluation: 1 Re-eval PT Treatments $Therapeutic Activity: 8-22 mins        Deniece Ree PT, DPT, CBIS  Supplemental Physical Therapist [place]    Pager [phone] Acute Rehab Office [phone]

## 2017-11-23 NOTE — Progress Notes (Signed)
RN rounded on pt and wife. Pt and wife state they do not need anything at this time.

## 2017-11-23 NOTE — Discharge Instructions (Addendum)
Cardiac Event Monitoring A cardiac event monitor is a small recording device that is used to detect abnormal heart rhythms (arrhythmias). The monitor is used to record your heart rhythm when you have symptoms, such as:  Fast heartbeats (palpitations), such as heart racing or fluttering.  Dizziness.  Fainting or light-headedness.  Unexplained weakness.  Some monitors are wired to electrodes placed on your chest. Electrodes are flat, sticky disks that attach to your skin. Other monitors may be hand-held or worn on the wrist. The monitor can be worn for up to 30 days. If the monitor is attached to your chest, a technician will prepare your chest for the electrode placement and show you how to work the monitor. Take time to practice using the monitor before you leave the office. Make sure you understand how to send the information from the monitor to your health care provider. In some cases, you may need to use a landline telephone instead of a cell phone. What are the risks? Generally, this device is safe to use, but it possible that the skin under the electrodes will become irritated. How to use your cardiac event monitor  Wear your monitor at all times, except when you are in water: ? Do not let the monitor get wet. ? Take the monitor off when you bathe. Do not swim or use a hot tub with it on.  Keep your skin clean. Do not put body lotion or moisturizer on your chest.  Change the electrodes as told by your health care provider or any time they stop sticking to your skin. You may need to use medical tape to keep them on.  Try to put the electrodes in slightly different places on your chest to help prevent skin irritation. They must remain in the area under your left breast and in the upper right section of your chest.  Make sure the monitor is safely clipped to your clothing or in a location close to your body that your health care provider recommends.  Press the button to record as  soon as you feel heart-related symptoms, such as: ? Dizziness. ? Weakness. ? Light-headedness. ? Palpitations. ? Thumping or pounding in your chest. ? Shortness of breath. ? Unexplained weakness.  Keep a diary of your activities, such as walking, doing chores, and taking medicine. It is very important to note what you were doing when you pushed the button to record your symptoms. This will help your health care provider determine what might be contributing to your symptoms.  Send the recorded information as recommended by your health care provider. It may take some time for your health care provider to process the results.  Change the batteries as told by your health care provider.  Keep electronic devices away from your monitor. This includes: ? Tablets. ? MP3 players. ? Cell phones.  While wearing your monitor you should avoid: ? Electric blankets. ? Armed forces operational officer. ? Electric toothbrushes. ? Microwave ovens. ? Magnets. ? Metal detectors. Get help right away if:  You have chest pain.  You have extreme difficulty breathing or shortness of breath.  You develop a very fast heartbeat that persists.  You develop dizziness that does not go away.  You faint or constantly feel like you are about to faint. Summary  A cardiac event monitor is a small recording device that is used to help detect abnormal heart rhythms (arrhythmias).  The monitor is used to record your heart rhythm when you have heart-related symptoms.  Make sure you understand how to send the information from the monitor to your health care provider.  It is important to press the button on the monitor when you have any heart-related symptoms.  Keep a diary of your activities, such as walking, doing chores, and taking medicine. It is very important to note what you were doing when you pushed the button to record your symptoms. This will help your health care provider learn what might be causing your  symptoms. This information is not intended to replace advice given to you by your health care provider. Make sure you discuss any questions you have with your health care provider. Document Released: 11/19/2007 Document Revised: 01/25/2016 Document Reviewed: 01/25/2016 Elsevier Interactive Patient Education  2017 Elsevier Inc.   Syncope Syncope is when you lose temporarily pass out (faint). Signs that you may be about to pass out include:  Feeling dizzy or light-headed.  Feeling sick to your stomach (nauseous).  Seeing all white or all black.  Having cold, clammy skin.  If you passed out, get help right away. Call your local emergency services (911 in the [place] Do not drive yourself to the hospital. Follow these instructions at home: Pay attention to any changes in your symptoms. Take these actions to help with your condition:  Have someone stay with you until you feel stable.  Do not drive, use machinery, or play sports until your doctor says it is okay.  Keep all follow-up visits as told by your doctor. This is important.  If you start to feel like you might pass out, lie down right away and raise (elevate) your feet above the level of your heart. Breathe deeply and steadily. Wait until all of the symptoms are gone.  Drink enough fluid to keep your pee (urine) clear or pale yellow.  If you are taking blood pressure or heart medicine, get up slowly and spend many minutes getting ready to sit and then stand. This can help with dizziness.  Take over-the-counter and prescription medicines only as told by your doctor.  Get help right away if:  You have a very bad headache.  You have unusual pain in your chest, tummy, or back.  You are bleeding from your mouth or rectum.  You have black or tarry poop (stool).  You have a very fast or uneven heartbeat (palpitations).  It hurts to breathe.  You pass out once or more than once.  You have jerky movements that you cannot  control (seizure).  You are confused.  You have trouble walking.  You are very weak.  You have vision problems. These symptoms may be an emergency. Do not wait to see if the symptoms will go away. Get medical help right away. Call your local emergency services (911 in the [place] Do not drive yourself to the hospital. This information is not intended to replace advice given to you by your health care provider. Make sure you discuss any questions you have with your health care provider. Document Released: 07/29/2007 Document Revised: 07/18/2015 Document Reviewed: 10/24/2014 Elsevier Interactive Patient Education  2018 Elsevier Inc. Nutrition Post [facility] Stay Proper nutrition can help your body recover from illness and injury.   Foods and beverages high in protein, vitamins, and minerals help rebuild muscle loss, promote healing, & reduce fall risk.   In addition to eating healthy foods, a nutrition shake is an easy, delicious way to get the nutrition you need during and after your hospital stay  It is recommended that  you continue to drink 2 bottles per day of:       Ensure Plus  for at least 1 month (30 days) after your hospital stay   Tips for adding a nutrition shake into your routine: As allowed, drink one with vitamins or medications instead of water or juice Enjoy one as a tasty mid-morning or afternoon snack Drink cold or make a milkshake out of it Drink one instead of milk with cereal or snacks Use as a coffee creamer   Available at the following grocery stores and pharmacies:           * Karin Golden * [employer] * [employer]  * R[employer]          * Walmart * [employer]  * Wal[facility]reens      * Target  * BJ's   * [facility]S  * [employer]   * [facility] [phone]            For COUPONS visit: www.ensure.com/join or RoleLink.com.br   Suggested Substitutions Ensure Plus = Boost Plus = Carnation Breakfast Essentials = Boost Compact Ensure Active  Clear = Boost Breeze Glucerna Shake = Boost Glucose Control = Carnation Breakfast Essentials SUGAR FREE

## 2017-11-23 NOTE — Progress Notes (Signed)
Unable to complete pt's admission hx d/t pt not alert and oriented. RN will complete when pt's family arrives.

## 2017-11-23 NOTE — Progress Notes (Signed)
Pt's wife states pt has not received bath since he arrived. CNA informed pt's wife that he received a full bath last night. Pt's wife is requesting a bath basin. RN informed pt that we do not carry basins anymore. Pt's wife is upset about this.

## 2017-11-23 NOTE — Care Management Note (Signed)
Case Management Note  Patient Details  Name: Nicholas Barnett MRN: [id number] Date of Birth: [birth year]  Subjective/Objective:   Pt presented for Syncopal Episode. PTA from home with wife and support of daughters. Pt has DME Cane, RW lightweight wheel chair and suction machine. Patients family asked for [name] and 3n1. PT recommendations for SNF- Family is refusing SNF at this time. CM did discuss plan for home. Pt is currently active with [facility] for [facility] PT Services. Aide to be added and family is agreeable. One daughter is a Charity fundraiser and family states they will not need Skilled RN via [facility].                    Action/Plan: CM asked Staff RN to ask PT to re-consult today. Plan will be to transition home via car 11-24-17. Referral made to [facility] with [facility] for DME [facility] lift and 3n1. Jesusita Oka will make patient aware of cost if co pay remains. [facility] to call the wife in regards to delivery and time of Michiel Sites Lift to be dropped off in the home. CM did make referral with Tiffany with [facility] for PT/Aide. SOC to begin within 24-48 hours post transition home. No further needs from CM at this time.   Expected Discharge Date:                  Expected Discharge Plan:  Home w Home Health Services  In-House Referral:  NA  Discharge planning Services  CM Consult  Post Acute Care Choice:  Home Health, Durable Medical Equipment Choice offered to:  Patient, Spouse, Adult Children  DME Arranged:  3-N-1, Other see comment([facility]oyer Lift) DME Agency:  [facility]  HH Arranged:  PT, Nurse's Aide HH Agency:  [facility]  Status of Service:  Completed, signed off  If discussed at [employer], dates discussed:    Additional Comments:  Gala Lewandowsky, RN 11/23/2017, 2:32 PM

## 2017-11-23 NOTE — Plan of Care (Signed)
  Problem: Activity: Goal: Risk for activity intolerance will decrease Outcome: Progressing   Problem: Education: Goal: Knowledge of General Education information will improve Description Including pain rating scale, medication(s)/side effects and non-pharmacologic comfort measures Outcome: Adequate for Discharge   Problem: Health Behavior/Discharge Planning: Goal: Ability to manage health-related needs will improve Outcome: Adequate for Discharge   Problem: Clinical Measurements: Goal: Ability to maintain clinical measurements within normal limits will improve Outcome: Adequate for Discharge   Problem: Nutrition: Goal: Adequate nutrition will be maintained Outcome: Adequate for Discharge   Problem: Safety: Goal: Ability to remain free from injury will improve Outcome: Adequate for Discharge   Problem: Skin Integrity: Goal: Risk for impaired skin integrity will decrease Outcome: Adequate for Discharge

## 2017-11-24 DIAGNOSIS — R9389 Abnormal findings on diagnostic imaging of other specified body structures: Secondary | ICD-10-CM

## 2017-11-24 DIAGNOSIS — W19XXXA Unspecified fall, initial encounter: Secondary | ICD-10-CM

## 2017-11-24 DIAGNOSIS — E43 Unspecified severe protein-calorie malnutrition: Secondary | ICD-10-CM

## 2017-11-24 LAB — URINE CULTURE

## 2017-11-24 LAB — GLUCOSE, CAPILLARY
GLUCOSE-CAPILLARY: 111 mg/dL — AB (ref 70–99)
GLUCOSE-CAPILLARY: 160 mg/dL — AB (ref 70–99)

## 2017-11-24 MED ORDER — BACITRACIN-NEOMYCIN-POLYMYXIN OINTMENT TUBE
TOPICAL_OINTMENT | CUTANEOUS | Status: DC | PRN
  Filled 2017-11-24 (×2): qty 14

## 2017-11-24 MED ORDER — AMOXICILLIN-POT CLAVULANATE 875-125 MG PO TABS: 1.0000 | ORAL_TABLET | Freq: Two times a day (BID) | ORAL | 0 refills | Status: AC

## 2017-11-24 NOTE — Discharge Summary (Addendum)
Discharge Summary  Nicholas Barnett GNF:[id number] DOB: [birth year]  PCP: [name], DO  Admit date: 11/21/2017 Discharge date: 11/24/2017  Time spent: 25 minutes  Recommendations for Outpatient Follow-up:  1. Follow-up with your PCP 2. Take your medications as prescribed 3. Fall precautions 4. Continue PT at home  Discharge Diagnoses:  Active Hospital Problems   Diagnosis Date Noted  . Syncope 11/21/2017  . Protein-calorie malnutrition, severe 11/24/2017  . Type II diabetes mellitus with renal manifestations (HCC) 01/22/2017  . Restless legs syndrome (RLS)   . Parkinson's plus syndrome (HCC) 11/24/2012  . Essential hypertension 12/16/2006    Resolved Hospital Problems  No resolved problems to display.    Discharge Condition: Stable  Diet recommendation: Resume previous diet  Vitals:   11/24/17 0328 11/24/17 1207  BP: 123/74 106/73  Pulse: 61 72  Resp: 18 (!) 24  Temp: 97.8 F (36.6 C) 98.3 F (36.8 C)  SpO2: 94% 92%    History of present illness:   Nicholas Barnett is an 84 y.o. male with pmh of Parkinson's disease with progressive decline over the last several months requiring assistance for all ADLs, memory loss, htn, hyperlipidemia, DM2, chronic dysphagia and b12 deficiency admitted last evening for unwitnessed syncopal event. Wife came home from church and found pt passed out in the shower with water still running. He was able to communicate with wife at time with nods and mentation improved by the time he arrived to ED. Pt remembers getting in shower, but no recollection of syncopal event. No bowel incontinence or tongue bite. Denies having any chest pain, palpitations, sob, n/v/d, recent fever or chills.  Admitted for syncope work-up and elevated troponin.  Cardiology consulted and followed and will arrange for an outpatient follow-up in 2-week event monitor.  11/24/2017: Patient seen and examined with his daughter at bedside.  No acute events overnight.  The  patient has no acute complaints.  He does not appear to be in distress.  Patient declines SNF and would like to go home.  On the day of discharge, the patient was hemodynamically stable.  He will need to follow-up with his PCP and cardiology post hospitalization.  Hospital Course:  Principal Problem:   Syncope Active Problems:   Essential hypertension   Parkinson's plus syndrome (HCC)   Restless legs syndrome (RLS)   Type II diabetes mellitus with renal manifestations (HCC)   Protein-calorie malnutrition, severe  Syncope -unclear etiology. Pt denies any chest pain. Could be related to Parkinsonism and recent decline and hot shower -MRI brain no acute process, chronic ischemic changes, remote infarcts -No PE or aortic dissection on CT angio -no events overnight on telemetry-- continue to monitor -mild elevation in trop 0.25>>0.64>>0.48. EKG NSR, bigeminy. Cardiology following -per cards: will arrange for 2 week event monitor upon d/c -echo preserved EF  Elevated troponin -see above. Pt denies any chest pain.  -neg cardiac w/u in 2016  Possible aspiration with known pharyngeal dysphagia -WBC improved on Unasyn D2.  -repeat SLP eval done today revealing pt is severe aspiration risk and recommending regular diet with thin liquids. -pulm toilet -change to Augmentin and monitor for response  Blistering of skin -right calf, medial aspect with blistering. Could be related to water temp/synocopal event -no s/sxs of infection -dressing, monitor  Parkinsons disease  -Progressive decline over the last several months per daughter. Wife acts as primary caretaker and pt requiring assistance for all ADL's.  -continue Sinemet, Remeron -will arrange home health PT/aide and hoyer  lift in hopes to help the family better care for him at home -mentioned a possible eval by hospice if continues to decline  Hypertension -Blood pressure stable -continue ACEI   DM2 -Resume  metformin  Right lower extremity blistery wounds Regular dressing changes RN for wound care at home until improves to avoid complications  Ambulatory dysfunction/fall Continue PT at home PT assessed and recommended SNF however patient declines SNF Fall precautions Per PT since declines SNF, he will require home health PT, Hoyer lift, and home health aide    Procedures:  None  Consultations:  Cardiology  Discharge Exam: BP 106/73   Pulse 72   Temp 98.3 F (36.8 C) (Oral)   Resp (!) 24   Ht 6\' 1"  (1.854 m)   Wt 68.9 kg   SpO2 92%   BMI 20.04 kg/m  . General: 84 y.o. year-old male well developed well nourished in no acute distress.  Alert and interactive with a soft voice. . Cardiovascular: Regular rate and rhythm with no rubs or gallops.  No thyromegaly or JVD noted.   Marland Kitchen Respiratory: Clear to auscultation with no wheezes or rales. Good inspiratory effort. . Abdomen: Soft nontender nondistended with normal bowel sounds x4 quadrants. . Musculoskeletal: No lower extremity edema. 2/4 pulses in all 4 extremities.  Lower extremity with blistery wounds. . Psychiatry: Mood is appropriate for condition and setting  Discharge Instructions You were cared for by a hospitalist during your hospital stay. If you have any questions about your discharge medications or the care you received while you were in the hospital after you are discharged, you can call the unit and asked to speak with the hospitalist on call if the hospitalist that took care of you is not available. Once you are discharged, your primary care physician will handle any further medical issues. Please note that NO REFILLS for any discharge medications will be authorized once you are discharged, as it is imperative that you return to your primary care physician (or establish a relationship with a primary care physician if you do not have one) for your aftercare needs so that they can reassess your need for medications and  monitor your lab values.   Allergies as of 11/24/2017      Reactions   Caffeine Swelling, Other (See Comments)   Reaction:  Joint swelling      Medication List    STOP taking these medications   ibuprofen 200 MG tablet Commonly known as:  ADVIL,MOTRIN   naproxen sodium 220 MG tablet Commonly known as:  ALEVE     TAKE these medications   amoxicillin-clavulanate 875-125 MG tablet Commonly known as:  AUGMENTIN Take 1 tablet by mouth 2 (two) times daily for 7 days.   aspirin EC 81 MG tablet Take 81 mg by mouth daily.   benazepril 5 MG tablet Commonly known as:  LOTENSIN TAKE 1 TABLET BY MOUTH  DAILY   CALCIUM-CARB 600 + D 600-125 MG-UNIT Tabs Generic drug:  Calcium Carbonate-Vitamin D Take 1 tablet by mouth 2 (two) times daily.   carbidopa-levodopa 25-100 MG tablet Commonly known as:  SINEMET IR Take 3 tablets by mouth 2 (two) times daily. What changed:    when to take this  additional instructions   cyanocobalamin 1000 MCG/ML injection Commonly known as:  (VITAMIN B-12) Inject 1 mL (1,000 mcg total) into the muscle every 30 (thirty) days. Pt receives on the 15th of every month. What changed:  additional instructions   diclofenac sodium 1 %  Gel Commonly known as:  VOLTAREN Apply 4 g topically 4 (four) times daily. To affected knee What changed:    when to take this  reasons to take this  additional instructions   FIBERCON PO Take 4 tablets by mouth at bedtime.   gabapentin 300 MG capsule Commonly known as:  NEURONTIN TAKE 1 CAPSULE BY MOUTH AT  BEDTIME   glucose blood test strip 1 each by Other route 3 (three) times daily. Dx: E11.43   metFORMIN 1000 MG tablet Commonly known as:  GLUCOPHAGE TAKE 1 TABLET BY MOUTH TWO  TIMES DAILY WITH MEALS What changed:  See the new instructions.   mirtazapine 30 MG tablet Commonly known as:  REMERON TAKE 1 TABLET BY MOUTH AT  BEDTIME What changed:    how much to take  how to take this  when to take  this  additional instructions   mupirocin ointment 2 % Commonly known as:  BACTROBAN APPLY  OINTMENT TOPICALLY AT BEDTIME TO  LESION  ON  SCROTUM What changed:  See the new instructions.   ONETOUCH DELICA LANCETS FINE Misc Use to check blood glucose three times daily. Dx: E11.43   OVER THE COUNTER MEDICATION Take 1 capsule by mouth at bedtime. CBD oil   pantoprazole 40 MG tablet Commonly known as:  PROTONIX TAKE 1 TABLET BY MOUTH TWO  TIMES DAILY   pravastatin 20 MG tablet Commonly known as:  PRAVACHOL TAKE 1 TABLET BY MOUTH AT  BEDTIME What changed:    how much to take  how to take this  when to take this  additional instructions   pyridOXINE 100 MG tablet Commonly known as:  VITAMIN B-6 Take 100 mg by mouth daily.   sodium phosphate 7-19 GM/118ML Enem Place 1 enema rectally once a week.   SYSTANE OP Place 1 drop into both eyes 2 (two) times daily.   tamsulosin 0.4 MG Caps capsule Commonly known as:  FLOMAX TAKE 1 CAPSULE BY MOUTH AT  BEDTIME            Durable Medical Equipment  (From admission, onward)         Start     Ordered   11/23/17 1425  For home use only DME 3 n 1  Once     11/23/17 1424   11/23/17 1159  For home use only DME Other see comment  Once     11/23/17 1158         Allergies  Allergen Reactions  . Caffeine Swelling and Other (See Comments)    Reaction:  Joint swelling   Follow-up Information    [facility] [address] [facility] Follow up on 12/01/2017.   Specialty:  Cardiology Why:  at 11:00AM this is for your monitor to wear for 2 weeks. Contact information: [address] [place] [address] [phone]       [facility] Follow up on 12/23/2017.   Specialty:  Cardiology Why:  Dr. Hazle Coca office with [facility] at 9:00 AM  Contact information: [address] [place] [address] [phone]       [facility] - Dme Follow up.    Why:  3n1, Financial risk analyst information: [address] [place] [address] [phone]        [facility] Follow up.   Specialty:  Home Health Services Why:  Physical Therapy, Aide Contact information: [address] [place] [address] [phone]  The results of significant diagnostics from this hospitalization (including imaging, microbiology, ancillary and laboratory) are listed below for reference.    Significant Diagnostic Studies: Dg Chest 2 View  Result Date: 11/21/2017 CLINICAL DATA:  Abnormal chest radiograph EXAM: CHEST - 2 VIEW COMPARISON:  11/21/2017 at 1532 hours FINDINGS: Persistent medial right upper lobe opacity. Additional mild patchy opacity overlying the right mid lung. This appearance favors multifocal infection/pneumonia. Left lung is essentially clear. No pleural effusion or pneumothorax. The heart is normal in size. IMPRESSION: Mild patchy right lung opacities, favoring multifocal infection/pneumonia. Follow-up is suggested to document clearance. Electronically Signed   By: Charline Bills M.D.   On: 11/21/2017 22:23   Ct Head Wo Contrast  Result Date: 11/21/2017 CLINICAL DATA:  Syncope. EXAM: CT HEAD WITHOUT CONTRAST CT CERVICAL SPINE WITHOUT CONTRAST TECHNIQUE: Multidetector CT imaging of the head and cervical spine was performed following the standard protocol without intravenous contrast. Multiplanar CT image reconstructions of the cervical spine were also generated. COMPARISON:  None. FINDINGS: CT HEAD FINDINGS Brain: Moderate diffuse cortical atrophy is noted. Mild chronic ischemic white matter disease is noted. No mass effect or midline shift is noted. Ventricular size is within normal limits. There is no evidence of mass lesion, hemorrhage or acute infarction. Vascular: No hyperdense vessel or unexpected calcification. Skull: Normal. Negative for fracture or focal lesion. Sinuses/Orbits: No acute finding. Other:  None. CT CERVICAL SPINE FINDINGS Alignment: Normal. Skull base and vertebrae: No acute fracture. No primary bone lesion or focal pathologic process. Soft tissues and spinal canal: No prevertebral fluid or swelling. No visible canal hematoma. Disc levels: Moderate degenerative disc disease is noted at C6-7 and C7-T1. Upper chest: Negative. Other: Degenerative changes are seen involving posterior facet joints bilaterally. IMPRESSION: Moderate diffuse cortical atrophy. Mild chronic ischemic white matter disease. No acute intracranial abnormality seen. Multilevel degenerative disc disease. No acute abnormality seen in the cervical spine. Electronically Signed   By: Lupita Raider, M.D.   On: 11/21/2017 18:32   Ct Cervical Spine Wo Contrast  Result Date: 11/21/2017 CLINICAL DATA:  Syncope. EXAM: CT HEAD WITHOUT CONTRAST CT CERVICAL SPINE WITHOUT CONTRAST TECHNIQUE: Multidetector CT imaging of the head and cervical spine was performed following the standard protocol without intravenous contrast. Multiplanar CT image reconstructions of the cervical spine were also generated. COMPARISON:  None. FINDINGS: CT HEAD FINDINGS Brain: Moderate diffuse cortical atrophy is noted. Mild chronic ischemic white matter disease is noted. No mass effect or midline shift is noted. Ventricular size is within normal limits. There is no evidence of mass lesion, hemorrhage or acute infarction. Vascular: No hyperdense vessel or unexpected calcification. Skull: Normal. Negative for fracture or focal lesion. Sinuses/Orbits: No acute finding. Other: None. CT CERVICAL SPINE FINDINGS Alignment: Normal. Skull base and vertebrae: No acute fracture. No primary bone lesion or focal pathologic process. Soft tissues and spinal canal: No prevertebral fluid or swelling. No visible canal hematoma. Disc levels: Moderate degenerative disc disease is noted at C6-7 and C7-T1. Upper chest: Negative. Other: Degenerative changes are seen involving posterior  facet joints bilaterally. IMPRESSION: Moderate diffuse cortical atrophy. Mild chronic ischemic white matter disease. No acute intracranial abnormality seen. Multilevel degenerative disc disease. No acute abnormality seen in the cervical spine. Electronically Signed   By: Lupita Raider, M.D.   On: 11/21/2017 18:32   Mr Brain Wo Contrast  Result Date: 11/22/2017 CLINICAL DATA:  Syncopal episode, found down in bathroom. Altered level of consciousness. History of Parkinson's disease, recurrent falls, hypertension and  diabetes. EXAM: MRI HEAD WITHOUT CONTRAST TECHNIQUE: Multiplanar, multiecho pulse sequences of the brain and surrounding structures were obtained without intravenous contrast. COMPARISON:  CT HEAD November 21, 2017 and MRI head November 21, 2013. FINDINGS: INTRACRANIAL CONTENTS: No reduced diffusion to suggest acute ischemia. No susceptibility artifact to suggest hemorrhage. Moderate to severe ventriculomegaly with narrowed callosum angle and sulcal effacement at the convexities. Symmetric basal ganglia mineralization. Susceptibility artifact within bilateral parietal and LEFT occipital lobe encephalomalacia. Old RIGHT thalamus lacunar infarct. Patchy supratentorial white matter FLAIR T2 hyperintensities exclusive aforementioned abnormality. No midline shift. Similar prominent bifrontal extra-axial spaces without extra-axial fluid collection. VASCULAR: Normal major intracranial vascular flow voids present at skull base. SKULL AND UPPER CERVICAL SPINE: No abnormal sellar expansion. No suspicious calvarial bone marrow signal. Craniocervical junction maintained. SINUSES/ORBITS: The mastoid air-cells and included paranasal sinuses are well-aerated.The included ocular globes and orbital contents are non-suspicious. Status post bilateral ocular lens implants. OTHER: None. IMPRESSION: 1. No acute intracranial process. 2. Old small biparietal and LEFT occipital lobe infarcts, possible old PRES or TBI. 3.  Moderate to severe parenchymal brain volume loss and imaging findings of normal pressure hydrocephalus. 4. Mild chronic small vessel ischemic changes. Old RIGHT thalamus lacunar infarct. Electronically Signed   By: Awilda Metro M.D.   On: 11/22/2017 06:37   Dg Pelvis Portable  Result Date: 11/21/2017 CLINICAL DATA:  Patient found down. EXAM: PORTABLE PELVIS 1-2 VIEWS COMPARISON:  None. FINDINGS: There is no evidence of pelvic fracture or diastasis. No pelvic bone lesions are seen. IMPRESSION: Negative. Electronically Signed   By: Gerome Sam III M.D   On: 11/21/2017 16:32   Dg Chest Portable 1 View  Result Date: 11/21/2017 CLINICAL DATA:  Change in mental status EXAM: PORTABLE CHEST 1 VIEW COMPARISON:  June 21, 2017 FINDINGS: Subtle haziness over the right upper lung is thought to be artifactual. Stable tortuous thoracic aorta. The heart, hila, mediastinum, lungs, and pleura are otherwise normal. IMPRESSION: Subtle haziness over the right apex is favored to be artifactual rather than subtle infiltrate. A PA and lateral chest x-ray could further evaluate if clinically warranted. Electronically Signed   By: Gerome Sam III M.D   On: 11/21/2017 16:31   Dg Foot Complete Left  Result Date: 11/21/2017 CLINICAL DATA:  Patient found on floor bathroom. EXAM: LEFT FOOT - COMPLETE 3+ VIEW COMPARISON:  None. FINDINGS: Vascular calcifications.  No fractures identified. IMPRESSION: Negative. Electronically Signed   By: Gerome Sam III M.D   On: 11/21/2017 16:32    Microbiology: Recent Results (from the past 240 hour(s))  Urine culture     Status: Abnormal   Collection Time: 11/21/17 10:42 PM  Result Value Ref Range Status   Specimen Description URINE, RANDOM  Final   Special Requests   Final    NONE Performed at [facility], [address] [place], [place] [address]    Culture 80,000 COLONIES/mL STAPHYLOCOCCUS EPIDERMIDIS (A)  Final   Report Status 11/24/2017 FINAL  Final    Organism ID, Bacteria STAPHYLOCOCCUS EPIDERMIDIS (A)  Final      Susceptibility   Staphylococcus epidermidis - MIC*    CIPROFLOXACIN <=0.5 SENSITIVE Sensitive     GENTAMICIN <=0.5 SENSITIVE Sensitive     NITROFURANTOIN <=16 SENSITIVE Sensitive     OXACILLIN >=4 RESISTANT Resistant     TETRACYCLINE 2 SENSITIVE Sensitive     VANCOMYCIN 1 SENSITIVE Sensitive     TRIMETH/SULFA 160 RESISTANT Resistant     CLINDAMYCIN <=0.25 SENSITIVE Sensitive  RIFAMPIN <=0.5 SENSITIVE Sensitive     Inducible Clindamycin NEGATIVE Sensitive     * 80,000 COLONIES/mL STAPHYLOCOCCUS EPIDERMIDIS     Labs: Basic Metabolic Panel: Recent Labs  Lab 11/21/17 1648 11/21/17 2123 11/22/17 0232 11/23/17 0433  NA 142  --  141 137  K 3.8  --  3.7 3.5  CL 105  --  104 104  CO2 24  --  24 27  GLUCOSE 132*  --  99 119*  BUN 21  --  22 22  CREATININE 1.10 1.08 1.07 1.04  CALCIUM 9.9  --  9.5 8.7*   Liver Function Tests: Recent Labs  Lab 11/21/17 1648  AST 27  ALT 15  ALKPHOS 63  BILITOT 1.1  PROT 7.1  ALBUMIN 3.9   No results for input(s): LIPASE, AMYLASE in the last 168 hours. No results for input(s): AMMONIA in the last 168 hours. CBC: Recent Labs  Lab 11/21/17 1648 11/22/17 0232 11/23/17 0433  WBC 16.5* 12.2* 9.3  NEUTROABS 15.1*  --   --   HGB 16.0 13.9 12.2*  HCT 48.0 42.5 38.0*  MCV 95.0 95.5 97.2  PLT 281 277 228   Cardiac Enzymes: Recent Labs  Lab 11/21/17 1648 11/21/17 2123 11/22/17 0232 11/22/17 0907  TROPONINI 0.04* 0.25* 0.64* 0.48*   BNP: BNP (last 3 results) Recent Labs    01/22/17 1720  BNP 216.2*    ProBNP (last 3 results) No results for input(s): PROBNP in the last 8760 hours.  CBG: Recent Labs  Lab 11/23/17 0741 11/23/17 1134 11/23/17 1633 11/23/17 2138 11/24/17 0754  GLUCAP 115* 94 216* 169* 111*       Signed:  Darlin Drop, MD [facility] 11/24/2017, 12:27 PM

## 2017-11-24 NOTE — Progress Notes (Signed)
Pt being discharged home via wheelchair with family. Pt alert and oriented x3/4. VSS. Pt c/o no pain at this time. No signs of respiratory distress. Education complete and care plans resolved. IV removed with catheter intact and pt tolerated well. Condom catheter also removed. No further issues at this time. Pt to follow up with PCP. Jillyn Hidden, RN

## 2017-11-24 NOTE — Consult Note (Signed)
   [facility] CM Inpatient Consult   11/24/2017  CROIX PRESLEY [birth year] [id number]   1230:  Patient screened for potential [facility] Management services. Patient is in the [facility] Management services under patient's [employer] plan. Met with the patient, wife and daughter at the bedside.  Patient did not verbalize. Daughter and wife answered questions.  Their concern surrounded getting home at timely fashion to get equipment training at home.  Explained [place] Management services. They will have Home Health care.  They confirm that Dr. Hollace Kinnier is their primary care provider.  This office does the transition of care follow up calls and appointments. Patient  Can benefit from General EMMI calls.  Family accepted the brochure, 24 hour nurse advise line magnet and contact information.  They are aware of virtual phone visits per wife.  For questions contact:   Natividad Brood, RN BSN [facility] Liaison  [phone] business mobile phone Toll free office [phone]

## 2017-11-25 ENCOUNTER — Other Ambulatory Visit: Payer: Self-pay

## 2017-11-25 ENCOUNTER — Telehealth: Payer: Self-pay

## 2017-11-25 DIAGNOSIS — H9193 Unspecified hearing loss, bilateral: Secondary | ICD-10-CM | POA: Diagnosis not present

## 2017-11-25 DIAGNOSIS — N182 Chronic kidney disease, stage 2 (mild): Secondary | ICD-10-CM | POA: Diagnosis not present

## 2017-11-25 DIAGNOSIS — M19012 Primary osteoarthritis, left shoulder: Secondary | ICD-10-CM | POA: Diagnosis not present

## 2017-11-25 DIAGNOSIS — Z9181 History of falling: Secondary | ICD-10-CM | POA: Diagnosis not present

## 2017-11-25 DIAGNOSIS — I452 Bifascicular block: Secondary | ICD-10-CM | POA: Diagnosis not present

## 2017-11-25 DIAGNOSIS — E559 Vitamin D deficiency, unspecified: Secondary | ICD-10-CM | POA: Diagnosis not present

## 2017-11-25 DIAGNOSIS — Z87440 Personal history of urinary (tract) infections: Secondary | ICD-10-CM | POA: Diagnosis not present

## 2017-11-25 DIAGNOSIS — R1319 Other dysphagia: Secondary | ICD-10-CM | POA: Diagnosis not present

## 2017-11-25 DIAGNOSIS — Z7984 Long term (current) use of oral hypoglycemic drugs: Secondary | ICD-10-CM | POA: Diagnosis not present

## 2017-11-25 DIAGNOSIS — Z87891 Personal history of nicotine dependence: Secondary | ICD-10-CM | POA: Diagnosis not present

## 2017-11-25 DIAGNOSIS — M1711 Unilateral primary osteoarthritis, right knee: Secondary | ICD-10-CM | POA: Diagnosis not present

## 2017-11-25 DIAGNOSIS — Z7982 Long term (current) use of aspirin: Secondary | ICD-10-CM | POA: Diagnosis not present

## 2017-11-25 DIAGNOSIS — I129 Hypertensive chronic kidney disease with stage 1 through stage 4 chronic kidney disease, or unspecified chronic kidney disease: Secondary | ICD-10-CM | POA: Diagnosis not present

## 2017-11-25 DIAGNOSIS — G2581 Restless legs syndrome: Secondary | ICD-10-CM | POA: Diagnosis not present

## 2017-11-25 DIAGNOSIS — E1143 Type 2 diabetes mellitus with diabetic autonomic (poly)neuropathy: Secondary | ICD-10-CM | POA: Diagnosis not present

## 2017-11-25 DIAGNOSIS — E1122 Type 2 diabetes mellitus with diabetic chronic kidney disease: Secondary | ICD-10-CM | POA: Diagnosis not present

## 2017-11-25 DIAGNOSIS — G214 Vascular parkinsonism: Secondary | ICD-10-CM | POA: Diagnosis not present

## 2017-11-25 DIAGNOSIS — M19011 Primary osteoarthritis, right shoulder: Secondary | ICD-10-CM | POA: Diagnosis not present

## 2017-11-25 DIAGNOSIS — E43 Unspecified severe protein-calorie malnutrition: Secondary | ICD-10-CM | POA: Diagnosis not present

## 2017-11-25 DIAGNOSIS — M810 Age-related osteoporosis without current pathological fracture: Secondary | ICD-10-CM | POA: Diagnosis not present

## 2017-11-25 DIAGNOSIS — M5033 Other cervical disc degeneration, cervicothoracic region: Secondary | ICD-10-CM | POA: Diagnosis not present

## 2017-11-25 DIAGNOSIS — D519 Vitamin B12 deficiency anemia, unspecified: Secondary | ICD-10-CM | POA: Diagnosis not present

## 2017-11-25 NOTE — Telephone Encounter (Signed)
Transition Care Management Follow-up Telephone Call  Date of discharge and from where: [facility] on 11/24/2017  How have you been since you were released from the hospital? Per patient's wife he has weakness on right side still  Any questions or concerns? Memory loss was a concern for her  Items Reviewed:  Did the pt receive and understand the discharge instructions provided? Yes   Medications obtained and verified? Yes   Any new allergies since your discharge? Yes   Dietary orders reviewed? Yes  Do you have support at home? Yes , wife  Other (ie: DME, Home Health, etc) N/A  Functional Questionnaire: (I = Independent and D = Dependent) ADL's: I w/ assit  Bathing/Dressing- I w/ assist   Meal Prep- D  Eating- I  Maintaining continence- I w/ assist  Transferring/Ambulation- I w/ scooter  Managing Meds- D   Follow up appointments reviewed:    PCP Hospital f/u appt confirmed? Yes  Scheduled to see Abbey Chatters, NP on 12/01/2017 @ 2:45pm.  Specialist Hospital f/u appt confirmed? Yes  Scheduled to see cardiologist  Are transportation arrangements needed? No   If their condition worsens, is the pt aware to call  their PCP or go to the ED? Yes  Was the patient provided with contact information for the PCP's office or ED? Yes  Was the pt encouraged to call back with questions or concerns? Yes

## 2017-11-26 ENCOUNTER — Telehealth: Payer: Self-pay

## 2017-11-26 NOTE — Telephone Encounter (Signed)
[name] with [facility] called to request verbal orders for Physical Therpay 2 x weekly for 6 weeks and Home Health Aid 3 x weekly for 6 weeks   Per [facility] standing order, verbal order given. Message will be sent to patient's provider as a FYI.

## 2017-12-01 ENCOUNTER — Ambulatory Visit (INDEPENDENT_AMBULATORY_CARE_PROVIDER_SITE_OTHER): Payer: Medicare Other | Admitting: Nurse Practitioner

## 2017-12-01 ENCOUNTER — Ambulatory Visit (INDEPENDENT_AMBULATORY_CARE_PROVIDER_SITE_OTHER): Payer: Medicare Other

## 2017-12-01 ENCOUNTER — Telehealth: Payer: Self-pay

## 2017-12-01 ENCOUNTER — Encounter: Payer: Self-pay | Admitting: Nurse Practitioner

## 2017-12-01 VITALS — BP 124/70 | HR 67 | Temp 97.9°F | Ht 73.0 in | Wt 156.0 lb

## 2017-12-01 DIAGNOSIS — R1310 Dysphagia, unspecified: Secondary | ICD-10-CM | POA: Diagnosis not present

## 2017-12-01 DIAGNOSIS — E43 Unspecified severe protein-calorie malnutrition: Secondary | ICD-10-CM

## 2017-12-01 DIAGNOSIS — J690 Pneumonitis due to inhalation of food and vomit: Secondary | ICD-10-CM | POA: Diagnosis not present

## 2017-12-01 DIAGNOSIS — R55 Syncope and collapse: Secondary | ICD-10-CM

## 2017-12-01 DIAGNOSIS — G214 Vascular parkinsonism: Secondary | ICD-10-CM | POA: Diagnosis not present

## 2017-12-01 DIAGNOSIS — R531 Weakness: Secondary | ICD-10-CM | POA: Diagnosis not present

## 2017-12-01 NOTE — Telephone Encounter (Signed)
Patient was in office to be seen today and mentioned he was to receive care from a Home Health Aid through [facility] and has not heard anything.  I called [facility] on [phone] to ask for status. I spoke with Shearon Balo and she stated the nurse manager will have to call to get verbal orders. I informed Shearon Balo that verbal orders were requested and given to [facility] on Friday 11/26/17. Shearon Balo stated Belenda Cruise is a physical therapist.

## 2017-12-01 NOTE — Progress Notes (Signed)
Careteam: Patient Care Team: [name], DO as PCP - General (Geriatric Medicine) Tat, Octaviano Batty, DO as Consulting Physician (Neurology) Ranee Gosselin, MD as Consulting Physician (Orthopedic Surgery) Nelson Chimes, MD as Consulting Physician (Ophthalmology) Mardella Layman, MD as Consulting Physician (Gastroenterology)  Advanced Directive information    Allergies  Allergen Reactions  . Caffeine Swelling and Other (See Comments)    Reaction:  Joint swelling    Chief Complaint  Patient presents with  . Transitions Of Care    Patient here today with daughter and wife. He was discharged from [facility] last Wednesday due to a fall and passing out. He also had pneumonia. He is still a bit weak and confused.      HPI: Patient is a 84 y.o. male seen in the office today for hospital follow up. pmh of Parkinson's disease with progressive decline over the last several months requiring assistance for all ADLs, memory loss, htn, hyperlipidemia, DM2, chronic dysphagia and b12 deficiency admitted last evening for unwitnessed syncopal event. Wife came home from church and found pt passed out in the shower with water still running. He was taken to the ED for further evaluation. He was admitted to the hospital from 11/21/17-11/24/17. Unclear reason behind event, ? Related to parkinson, noted elevated troponin. Cardiology was consulted and plans to arrange 2 week cardiac monitor which he currently is wearing- got set up earlier today. SNF was recommended but wanted to go home. Home health PT has been by once. [facility] is home health. Family though a home health aid would be out but has not seen them. They have hoyer lift at home which has been helpful. No further episodes.   Not speaking as much for the last few months.   SLP evaluation done with severe aspiration risk, recommending regular diet with thin liquids because he will not eat modified diet per daughter. Per daughter intake  depends on mood. Aspiration pneumonia noted= Completing Augmentin this evening.   Blister noted to right medial calf. Covered by band aid today. Healing, no signs of infection  Review of Systems:  Review of Systems  Unable to perform ROS: Dementia   Past Medical History:  Diagnosis Date  . Abnormality of gait   . Arthritis   . Bifascicular block   . Cyanocobalamin deficiency   . Depression   . Diverticulitis   . Dizzy   . Dysphagia, idiopathic    History of aspiration  . Gait disturbance   . Hearing loss of both ears   . Hyperlipidemia   . Hypertension   . Lumbar back pain   . Memory loss   . Neck pain   . Osteoarthritis   . Osteoporosis   . Other and unspecified hyperlipidemia   . Parkinsonism (HCC)   . Recurrent falls    Using a cane and walker  . Restless leg   . Restless legs syndrome (RLS)   . Right knee pain   . Shoulder pain   . Thoracic back pain   . Thyroid disease   . Type 2 diabetes mellitus with autonomic neuropathy (HCC)   . Unspecified essential hypertension   . Unspecified hypothyroidism   . Vitamin D deficiency    Past Surgical History:  Procedure Laterality Date  . CATARACT EXTRACTION Bilateral 2012   Dr. Hazle Quant   Social History:   reports that he quit smoking about 43 years ago. He has never used smokeless tobacco. He reports that he does  not drink alcohol or use drugs.  Family History  Problem Relation Age of Onset  . Heart disease Mother   . Diabetes Mother   . Stroke Mother   . Cancer Father        prostate  . Diabetes Sister   . Heart disease Brother     Medications: Patient's Medications  New Prescriptions   No medications on file  Previous Medications   AMOXICILLIN-CLAVULANATE (AUGMENTIN) 875-125 MG TABLET    Take 1 tablet by mouth 2 (two) times daily for 7 days.   ASPIRIN EC 81 MG TABLET    Take 81 mg by mouth daily.   BENAZEPRIL (LOTENSIN) 5 MG TABLET    TAKE 1 TABLET BY MOUTH  DAILY   CALCIUM CARBONATE-VITAMIN D  (CALCIUM-CARB 600 + D) 600-125 MG-UNIT TABS    Take 1 tablet by mouth 2 (two) times daily.    CALCIUM POLYCARBOPHIL (FIBERCON PO)    Take 4 tablets by mouth at bedtime.   CARBIDOPA-LEVODOPA (SINEMET IR) 25-100 MG TABLET    Take 3 tablets by mouth 2 (two) times daily.   CYANOCOBALAMIN (,VITAMIN B-12,) 1000 MCG/ML INJECTION    Inject 1 mL (1,000 mcg total) into the muscle every 30 (thirty) days. Pt receives on the 15th of every month.   DICLOFENAC SODIUM (VOLTAREN) 1 % GEL    Apply 4 g topically 4 (four) times daily. To affected knee   GABAPENTIN (NEURONTIN) 300 MG CAPSULE    TAKE 1 CAPSULE BY MOUTH AT  BEDTIME   GLUCOSE BLOOD (ONE TOUCH ULTRA TEST) TEST STRIP    1 each by Other route 3 (three) times daily. Dx: E11.43   METFORMIN (GLUCOPHAGE) 1000 MG TABLET    TAKE 1 TABLET BY MOUTH TWO  TIMES DAILY WITH MEALS   MIRTAZAPINE (REMERON) 30 MG TABLET    TAKE 1 TABLET BY MOUTH AT  BEDTIME   MUPIROCIN OINTMENT (BACTROBAN) 2 %    APPLY  OINTMENT TOPICALLY AT BEDTIME TO  LESION  ON  SCROTUM   ONETOUCH DELICA LANCETS FINE MISC    Use to check blood glucose three times daily. Dx: E11.43   OVER THE COUNTER MEDICATION    Take 1 capsule by mouth at bedtime. CBD oil   PANTOPRAZOLE (PROTONIX) 40 MG TABLET    TAKE 1 TABLET BY MOUTH TWO  TIMES DAILY   POLYETHYL GLYCOL-PROPYL GLYCOL (SYSTANE OP)    Place 1 drop into both eyes 2 (two) times daily.   PRAVASTATIN (PRAVACHOL) 20 MG TABLET    TAKE 1 TABLET BY MOUTH AT  BEDTIME   PYRIDOXINE (VITAMIN B-6) 100 MG TABLET    Take 100 mg by mouth daily.   SODIUM PHOSPHATE (FLEET) 7-19 GM/118ML ENEM    Place 1 enema rectally once a week.   TAMSULOSIN (FLOMAX) 0.4 MG CAPS CAPSULE    TAKE 1 CAPSULE BY MOUTH AT  BEDTIME  Modified Medications   No medications on file  Discontinued Medications   No medications on file     Physical Exam:  Vitals:   12/01/17 1457  BP: 124/70  Pulse: 67  Temp: 97.9 F (36.6 C)  SpO2: 95%  Weight: 156 lb (70.8 kg)  Height: 6\' 1"  (1.854 m)    Body mass index is 20.58 kg/m.  Physical Exam  Constitutional: No distress.  HENT:  Head: Normocephalic and atraumatic.  Hearing aids  Eyes:  glasses  Cardiovascular: Normal rate, regular rhythm, normal heart sounds and intact distal pulses.  Pulmonary/Chest: Effort normal.  No respiratory distress. He has no wheezes. He has no rales.  Coarse rhonchi throughout lung fields  Abdominal: Soft. Bowel sounds are normal. He exhibits no distension. There is no tenderness.  Musculoskeletal: Normal range of motion.  Come in wheelchair, very weak and requires assistance for transfers  Neurological: He is alert.  Non-communicative, wife and daughter answer questions  Skin: Skin is warm and dry.   Labs reviewed: Basic Metabolic Panel: Recent Labs    01/25/17 0535  11/21/17 1648 11/21/17 2020 11/21/17 2123 11/22/17 0232 11/23/17 0433  NA 140   < > 142  --   --  141 137  K 2.9*   < > 3.8  --   --  3.7 3.5  CL 108   < > 105  --   --  104 104  CO2 24   < > 24  --   --  24 27  GLUCOSE 108*   < > 132*  --   --  99 119*  BUN 17   < > 21  --   --  22 22  CREATININE 0.90   < > 1.10  --  1.08 1.07 1.04  CALCIUM 8.1*   < > 9.9  --   --  9.5 8.7*  MG 1.2*  --   --   --   --   --   --   TSH  --   --   --  0.990  --   --   --    < > = values in this interval not displayed.   Liver Function Tests: Recent Labs    12/08/16 1216 01/22/17 1720 07/09/17 1008 11/21/17 1648  AST 14 83* 13 27  ALT 6 34 4* 15  ALKPHOS 90 84  --  63  BILITOT 0.4 1.3* 0.7 1.1  PROT 6.7 7.1 6.4 7.1  ALBUMIN 4.2 3.9  --  3.9   Recent Labs    01/22/17 1720  LIPASE 21   No results for input(s): AMMONIA in the last 8760 hours. CBC: Recent Labs    07/09/17 1008 11/02/17 1119 11/21/17 1648 11/22/17 0232 11/23/17 0433  WBC 7.0 8.7 16.5* 12.2* 9.3  NEUTROABS 3,801 5,655 15.1*  --   --   HGB 14.3 14.3 16.0 13.9 12.2*  HCT 42.3 42.6 48.0 42.5 38.0*  MCV 92.0 92.8 95.0 95.5 97.2  PLT 294 262 281 277 228     Lipid Panel: No results for input(s): CHOL, HDL, LDLCALC, TRIG, CHOLHDL, LDLDIRECT in the last 8760 hours. TSH: Recent Labs    11/21/17 2020  TSH 0.990   A1C: Lab Results  Component Value Date   HGBA1C 5.7 (H) 11/02/2017     Assessment/Plan 1. Vascular parkinsonism (HCC) Progressive decline noted. Wife and daughter providing support. Pt has DME at home which has been helpful   2. Weakness Progressive decline. Has not heard from home health aid, will contact [facility] to confirm they are planning to have a nurse and home health aid come out  3. Dysphagia, unspecified type Recent SLP instructions for regular diet with thin liquids.   4. Aspiration pneumonia, unspecified aspiration pneumonia type, unspecified laterality, unspecified part of lung (HCC) Clinically improved, completing Augmentin today. Aware of risk for ongoing pneumonia due to progressive disease and dysphagia   5. Syncope, unspecified syncope type No further episode, event monitor was placed today  6. Protein-calorie malnutrition, severe Weight gain noted from hospital, however overall weight trending down. Family states will  eat when he wants. Supplements provided.   Next appt: 12/01/2017 Janene Harvey. Biagio Borg  [facility] [phone]

## 2017-12-02 ENCOUNTER — Telehealth: Payer: Self-pay | Admitting: *Deleted

## 2017-12-02 NOTE — Telephone Encounter (Signed)
Nicholas Barnett with [facility] called requesting verbal orders for Home Health Aid to help with bathing. Verbal orders given.

## 2017-12-02 NOTE — Telephone Encounter (Signed)
I spoke with patient's spouse, she had also contacted [facility] and was told they will need a written order prior to starting Home Health Aid services.  I will check fax to see if we received a request   No incoming fax received to date  S.Chrae B/CMA

## 2017-12-06 ENCOUNTER — Ambulatory Visit: Payer: Medicare Other | Admitting: Nurse Practitioner

## 2017-12-07 DIAGNOSIS — E1143 Type 2 diabetes mellitus with diabetic autonomic (poly)neuropathy: Secondary | ICD-10-CM | POA: Diagnosis not present

## 2017-12-07 DIAGNOSIS — Z7984 Long term (current) use of oral hypoglycemic drugs: Secondary | ICD-10-CM | POA: Diagnosis not present

## 2017-12-07 DIAGNOSIS — M5033 Other cervical disc degeneration, cervicothoracic region: Secondary | ICD-10-CM | POA: Diagnosis not present

## 2017-12-07 DIAGNOSIS — Z87891 Personal history of nicotine dependence: Secondary | ICD-10-CM | POA: Diagnosis not present

## 2017-12-07 DIAGNOSIS — E559 Vitamin D deficiency, unspecified: Secondary | ICD-10-CM | POA: Diagnosis not present

## 2017-12-07 DIAGNOSIS — M19011 Primary osteoarthritis, right shoulder: Secondary | ICD-10-CM | POA: Diagnosis not present

## 2017-12-07 DIAGNOSIS — M19012 Primary osteoarthritis, left shoulder: Secondary | ICD-10-CM | POA: Diagnosis not present

## 2017-12-07 DIAGNOSIS — D519 Vitamin B12 deficiency anemia, unspecified: Secondary | ICD-10-CM | POA: Diagnosis not present

## 2017-12-07 DIAGNOSIS — I452 Bifascicular block: Secondary | ICD-10-CM | POA: Diagnosis not present

## 2017-12-07 DIAGNOSIS — M810 Age-related osteoporosis without current pathological fracture: Secondary | ICD-10-CM | POA: Diagnosis not present

## 2017-12-07 DIAGNOSIS — R1319 Other dysphagia: Secondary | ICD-10-CM | POA: Diagnosis not present

## 2017-12-07 DIAGNOSIS — Z7982 Long term (current) use of aspirin: Secondary | ICD-10-CM | POA: Diagnosis not present

## 2017-12-07 DIAGNOSIS — H9193 Unspecified hearing loss, bilateral: Secondary | ICD-10-CM | POA: Diagnosis not present

## 2017-12-07 DIAGNOSIS — M1711 Unilateral primary osteoarthritis, right knee: Secondary | ICD-10-CM | POA: Diagnosis not present

## 2017-12-07 DIAGNOSIS — N182 Chronic kidney disease, stage 2 (mild): Secondary | ICD-10-CM | POA: Diagnosis not present

## 2017-12-07 DIAGNOSIS — E43 Unspecified severe protein-calorie malnutrition: Secondary | ICD-10-CM | POA: Diagnosis not present

## 2017-12-07 DIAGNOSIS — E1122 Type 2 diabetes mellitus with diabetic chronic kidney disease: Secondary | ICD-10-CM | POA: Diagnosis not present

## 2017-12-07 DIAGNOSIS — G214 Vascular parkinsonism: Secondary | ICD-10-CM | POA: Diagnosis not present

## 2017-12-07 DIAGNOSIS — Z9181 History of falling: Secondary | ICD-10-CM | POA: Diagnosis not present

## 2017-12-07 DIAGNOSIS — G2581 Restless legs syndrome: Secondary | ICD-10-CM | POA: Diagnosis not present

## 2017-12-07 DIAGNOSIS — Z87440 Personal history of urinary (tract) infections: Secondary | ICD-10-CM | POA: Diagnosis not present

## 2017-12-07 DIAGNOSIS — I129 Hypertensive chronic kidney disease with stage 1 through stage 4 chronic kidney disease, or unspecified chronic kidney disease: Secondary | ICD-10-CM | POA: Diagnosis not present

## 2017-12-08 DIAGNOSIS — E1122 Type 2 diabetes mellitus with diabetic chronic kidney disease: Secondary | ICD-10-CM | POA: Diagnosis not present

## 2017-12-08 DIAGNOSIS — G2581 Restless legs syndrome: Secondary | ICD-10-CM | POA: Diagnosis not present

## 2017-12-08 DIAGNOSIS — E1143 Type 2 diabetes mellitus with diabetic autonomic (poly)neuropathy: Secondary | ICD-10-CM | POA: Diagnosis not present

## 2017-12-08 DIAGNOSIS — R1319 Other dysphagia: Secondary | ICD-10-CM | POA: Diagnosis not present

## 2017-12-08 DIAGNOSIS — Z87440 Personal history of urinary (tract) infections: Secondary | ICD-10-CM | POA: Diagnosis not present

## 2017-12-08 DIAGNOSIS — M1711 Unilateral primary osteoarthritis, right knee: Secondary | ICD-10-CM | POA: Diagnosis not present

## 2017-12-08 DIAGNOSIS — Z9181 History of falling: Secondary | ICD-10-CM | POA: Diagnosis not present

## 2017-12-08 DIAGNOSIS — E43 Unspecified severe protein-calorie malnutrition: Secondary | ICD-10-CM | POA: Diagnosis not present

## 2017-12-08 DIAGNOSIS — M5033 Other cervical disc degeneration, cervicothoracic region: Secondary | ICD-10-CM | POA: Diagnosis not present

## 2017-12-08 DIAGNOSIS — N182 Chronic kidney disease, stage 2 (mild): Secondary | ICD-10-CM | POA: Diagnosis not present

## 2017-12-08 DIAGNOSIS — D519 Vitamin B12 deficiency anemia, unspecified: Secondary | ICD-10-CM | POA: Diagnosis not present

## 2017-12-08 DIAGNOSIS — G214 Vascular parkinsonism: Secondary | ICD-10-CM | POA: Diagnosis not present

## 2017-12-08 DIAGNOSIS — M810 Age-related osteoporosis without current pathological fracture: Secondary | ICD-10-CM | POA: Diagnosis not present

## 2017-12-08 DIAGNOSIS — Z7982 Long term (current) use of aspirin: Secondary | ICD-10-CM | POA: Diagnosis not present

## 2017-12-08 DIAGNOSIS — E559 Vitamin D deficiency, unspecified: Secondary | ICD-10-CM | POA: Diagnosis not present

## 2017-12-08 DIAGNOSIS — I129 Hypertensive chronic kidney disease with stage 1 through stage 4 chronic kidney disease, or unspecified chronic kidney disease: Secondary | ICD-10-CM | POA: Diagnosis not present

## 2017-12-08 DIAGNOSIS — H9193 Unspecified hearing loss, bilateral: Secondary | ICD-10-CM | POA: Diagnosis not present

## 2017-12-08 DIAGNOSIS — M19011 Primary osteoarthritis, right shoulder: Secondary | ICD-10-CM | POA: Diagnosis not present

## 2017-12-08 DIAGNOSIS — I452 Bifascicular block: Secondary | ICD-10-CM | POA: Diagnosis not present

## 2017-12-08 DIAGNOSIS — Z87891 Personal history of nicotine dependence: Secondary | ICD-10-CM | POA: Diagnosis not present

## 2017-12-08 DIAGNOSIS — Z7984 Long term (current) use of oral hypoglycemic drugs: Secondary | ICD-10-CM | POA: Diagnosis not present

## 2017-12-08 DIAGNOSIS — M19012 Primary osteoarthritis, left shoulder: Secondary | ICD-10-CM | POA: Diagnosis not present

## 2017-12-09 DIAGNOSIS — M19012 Primary osteoarthritis, left shoulder: Secondary | ICD-10-CM | POA: Diagnosis not present

## 2017-12-09 DIAGNOSIS — I129 Hypertensive chronic kidney disease with stage 1 through stage 4 chronic kidney disease, or unspecified chronic kidney disease: Secondary | ICD-10-CM | POA: Diagnosis not present

## 2017-12-09 DIAGNOSIS — Z7982 Long term (current) use of aspirin: Secondary | ICD-10-CM | POA: Diagnosis not present

## 2017-12-09 DIAGNOSIS — Z87891 Personal history of nicotine dependence: Secondary | ICD-10-CM | POA: Diagnosis not present

## 2017-12-09 DIAGNOSIS — M19011 Primary osteoarthritis, right shoulder: Secondary | ICD-10-CM | POA: Diagnosis not present

## 2017-12-09 DIAGNOSIS — M5033 Other cervical disc degeneration, cervicothoracic region: Secondary | ICD-10-CM | POA: Diagnosis not present

## 2017-12-09 DIAGNOSIS — H9193 Unspecified hearing loss, bilateral: Secondary | ICD-10-CM | POA: Diagnosis not present

## 2017-12-09 DIAGNOSIS — E1143 Type 2 diabetes mellitus with diabetic autonomic (poly)neuropathy: Secondary | ICD-10-CM | POA: Diagnosis not present

## 2017-12-09 DIAGNOSIS — M810 Age-related osteoporosis without current pathological fracture: Secondary | ICD-10-CM | POA: Diagnosis not present

## 2017-12-09 DIAGNOSIS — Z9181 History of falling: Secondary | ICD-10-CM | POA: Diagnosis not present

## 2017-12-09 DIAGNOSIS — G214 Vascular parkinsonism: Secondary | ICD-10-CM | POA: Diagnosis not present

## 2017-12-09 DIAGNOSIS — G2581 Restless legs syndrome: Secondary | ICD-10-CM | POA: Diagnosis not present

## 2017-12-09 DIAGNOSIS — Z87440 Personal history of urinary (tract) infections: Secondary | ICD-10-CM | POA: Diagnosis not present

## 2017-12-09 DIAGNOSIS — N182 Chronic kidney disease, stage 2 (mild): Secondary | ICD-10-CM | POA: Diagnosis not present

## 2017-12-09 DIAGNOSIS — M1711 Unilateral primary osteoarthritis, right knee: Secondary | ICD-10-CM | POA: Diagnosis not present

## 2017-12-09 DIAGNOSIS — I452 Bifascicular block: Secondary | ICD-10-CM | POA: Diagnosis not present

## 2017-12-09 DIAGNOSIS — D519 Vitamin B12 deficiency anemia, unspecified: Secondary | ICD-10-CM | POA: Diagnosis not present

## 2017-12-09 DIAGNOSIS — E43 Unspecified severe protein-calorie malnutrition: Secondary | ICD-10-CM | POA: Diagnosis not present

## 2017-12-09 DIAGNOSIS — E1122 Type 2 diabetes mellitus with diabetic chronic kidney disease: Secondary | ICD-10-CM | POA: Diagnosis not present

## 2017-12-09 DIAGNOSIS — R1319 Other dysphagia: Secondary | ICD-10-CM | POA: Diagnosis not present

## 2017-12-09 DIAGNOSIS — E559 Vitamin D deficiency, unspecified: Secondary | ICD-10-CM | POA: Diagnosis not present

## 2017-12-09 DIAGNOSIS — Z7984 Long term (current) use of oral hypoglycemic drugs: Secondary | ICD-10-CM | POA: Diagnosis not present

## 2017-12-13 DIAGNOSIS — Z87891 Personal history of nicotine dependence: Secondary | ICD-10-CM | POA: Diagnosis not present

## 2017-12-13 DIAGNOSIS — G2581 Restless legs syndrome: Secondary | ICD-10-CM | POA: Diagnosis not present

## 2017-12-13 DIAGNOSIS — E1143 Type 2 diabetes mellitus with diabetic autonomic (poly)neuropathy: Secondary | ICD-10-CM | POA: Diagnosis not present

## 2017-12-13 DIAGNOSIS — G214 Vascular parkinsonism: Secondary | ICD-10-CM | POA: Diagnosis not present

## 2017-12-13 DIAGNOSIS — M19012 Primary osteoarthritis, left shoulder: Secondary | ICD-10-CM | POA: Diagnosis not present

## 2017-12-13 DIAGNOSIS — M5033 Other cervical disc degeneration, cervicothoracic region: Secondary | ICD-10-CM | POA: Diagnosis not present

## 2017-12-13 DIAGNOSIS — M19011 Primary osteoarthritis, right shoulder: Secondary | ICD-10-CM | POA: Diagnosis not present

## 2017-12-13 DIAGNOSIS — H9193 Unspecified hearing loss, bilateral: Secondary | ICD-10-CM | POA: Diagnosis not present

## 2017-12-13 DIAGNOSIS — Z7982 Long term (current) use of aspirin: Secondary | ICD-10-CM | POA: Diagnosis not present

## 2017-12-13 DIAGNOSIS — M810 Age-related osteoporosis without current pathological fracture: Secondary | ICD-10-CM | POA: Diagnosis not present

## 2017-12-13 DIAGNOSIS — I129 Hypertensive chronic kidney disease with stage 1 through stage 4 chronic kidney disease, or unspecified chronic kidney disease: Secondary | ICD-10-CM | POA: Diagnosis not present

## 2017-12-13 DIAGNOSIS — I452 Bifascicular block: Secondary | ICD-10-CM | POA: Diagnosis not present

## 2017-12-13 DIAGNOSIS — N182 Chronic kidney disease, stage 2 (mild): Secondary | ICD-10-CM | POA: Diagnosis not present

## 2017-12-13 DIAGNOSIS — E1122 Type 2 diabetes mellitus with diabetic chronic kidney disease: Secondary | ICD-10-CM | POA: Diagnosis not present

## 2017-12-13 DIAGNOSIS — R1319 Other dysphagia: Secondary | ICD-10-CM | POA: Diagnosis not present

## 2017-12-13 DIAGNOSIS — Z7984 Long term (current) use of oral hypoglycemic drugs: Secondary | ICD-10-CM | POA: Diagnosis not present

## 2017-12-13 DIAGNOSIS — E43 Unspecified severe protein-calorie malnutrition: Secondary | ICD-10-CM | POA: Diagnosis not present

## 2017-12-13 DIAGNOSIS — M1711 Unilateral primary osteoarthritis, right knee: Secondary | ICD-10-CM | POA: Diagnosis not present

## 2017-12-13 DIAGNOSIS — Z87440 Personal history of urinary (tract) infections: Secondary | ICD-10-CM | POA: Diagnosis not present

## 2017-12-13 DIAGNOSIS — Z9181 History of falling: Secondary | ICD-10-CM | POA: Diagnosis not present

## 2017-12-13 DIAGNOSIS — D519 Vitamin B12 deficiency anemia, unspecified: Secondary | ICD-10-CM | POA: Diagnosis not present

## 2017-12-13 DIAGNOSIS — E559 Vitamin D deficiency, unspecified: Secondary | ICD-10-CM | POA: Diagnosis not present

## 2017-12-15 DIAGNOSIS — N182 Chronic kidney disease, stage 2 (mild): Secondary | ICD-10-CM | POA: Diagnosis not present

## 2017-12-15 DIAGNOSIS — H9193 Unspecified hearing loss, bilateral: Secondary | ICD-10-CM | POA: Diagnosis not present

## 2017-12-15 DIAGNOSIS — M1711 Unilateral primary osteoarthritis, right knee: Secondary | ICD-10-CM | POA: Diagnosis not present

## 2017-12-15 DIAGNOSIS — D519 Vitamin B12 deficiency anemia, unspecified: Secondary | ICD-10-CM | POA: Diagnosis not present

## 2017-12-15 DIAGNOSIS — E1143 Type 2 diabetes mellitus with diabetic autonomic (poly)neuropathy: Secondary | ICD-10-CM | POA: Diagnosis not present

## 2017-12-15 DIAGNOSIS — E1122 Type 2 diabetes mellitus with diabetic chronic kidney disease: Secondary | ICD-10-CM | POA: Diagnosis not present

## 2017-12-15 DIAGNOSIS — Z87891 Personal history of nicotine dependence: Secondary | ICD-10-CM | POA: Diagnosis not present

## 2017-12-15 DIAGNOSIS — I129 Hypertensive chronic kidney disease with stage 1 through stage 4 chronic kidney disease, or unspecified chronic kidney disease: Secondary | ICD-10-CM | POA: Diagnosis not present

## 2017-12-15 DIAGNOSIS — R1319 Other dysphagia: Secondary | ICD-10-CM | POA: Diagnosis not present

## 2017-12-15 DIAGNOSIS — M19011 Primary osteoarthritis, right shoulder: Secondary | ICD-10-CM | POA: Diagnosis not present

## 2017-12-15 DIAGNOSIS — M810 Age-related osteoporosis without current pathological fracture: Secondary | ICD-10-CM | POA: Diagnosis not present

## 2017-12-15 DIAGNOSIS — I452 Bifascicular block: Secondary | ICD-10-CM | POA: Diagnosis not present

## 2017-12-15 DIAGNOSIS — M19012 Primary osteoarthritis, left shoulder: Secondary | ICD-10-CM | POA: Diagnosis not present

## 2017-12-15 DIAGNOSIS — E43 Unspecified severe protein-calorie malnutrition: Secondary | ICD-10-CM | POA: Diagnosis not present

## 2017-12-15 DIAGNOSIS — G214 Vascular parkinsonism: Secondary | ICD-10-CM | POA: Diagnosis not present

## 2017-12-15 DIAGNOSIS — Z87440 Personal history of urinary (tract) infections: Secondary | ICD-10-CM | POA: Diagnosis not present

## 2017-12-15 DIAGNOSIS — Z7982 Long term (current) use of aspirin: Secondary | ICD-10-CM | POA: Diagnosis not present

## 2017-12-15 DIAGNOSIS — Z7984 Long term (current) use of oral hypoglycemic drugs: Secondary | ICD-10-CM | POA: Diagnosis not present

## 2017-12-15 DIAGNOSIS — G2581 Restless legs syndrome: Secondary | ICD-10-CM | POA: Diagnosis not present

## 2017-12-15 DIAGNOSIS — Z9181 History of falling: Secondary | ICD-10-CM | POA: Diagnosis not present

## 2017-12-15 DIAGNOSIS — M5033 Other cervical disc degeneration, cervicothoracic region: Secondary | ICD-10-CM | POA: Diagnosis not present

## 2017-12-15 DIAGNOSIS — E559 Vitamin D deficiency, unspecified: Secondary | ICD-10-CM | POA: Diagnosis not present

## 2017-12-20 ENCOUNTER — Other Ambulatory Visit: Payer: Self-pay

## 2017-12-20 DIAGNOSIS — Z87440 Personal history of urinary (tract) infections: Secondary | ICD-10-CM | POA: Diagnosis not present

## 2017-12-20 DIAGNOSIS — G214 Vascular parkinsonism: Secondary | ICD-10-CM | POA: Diagnosis not present

## 2017-12-20 DIAGNOSIS — I452 Bifascicular block: Secondary | ICD-10-CM | POA: Diagnosis not present

## 2017-12-20 DIAGNOSIS — D519 Vitamin B12 deficiency anemia, unspecified: Secondary | ICD-10-CM | POA: Diagnosis not present

## 2017-12-20 DIAGNOSIS — E1122 Type 2 diabetes mellitus with diabetic chronic kidney disease: Secondary | ICD-10-CM | POA: Diagnosis not present

## 2017-12-20 DIAGNOSIS — M5033 Other cervical disc degeneration, cervicothoracic region: Secondary | ICD-10-CM | POA: Diagnosis not present

## 2017-12-20 DIAGNOSIS — Z7984 Long term (current) use of oral hypoglycemic drugs: Secondary | ICD-10-CM | POA: Diagnosis not present

## 2017-12-20 DIAGNOSIS — M19012 Primary osteoarthritis, left shoulder: Secondary | ICD-10-CM | POA: Diagnosis not present

## 2017-12-20 DIAGNOSIS — M19011 Primary osteoarthritis, right shoulder: Secondary | ICD-10-CM | POA: Diagnosis not present

## 2017-12-20 DIAGNOSIS — E43 Unspecified severe protein-calorie malnutrition: Secondary | ICD-10-CM | POA: Diagnosis not present

## 2017-12-20 DIAGNOSIS — N182 Chronic kidney disease, stage 2 (mild): Secondary | ICD-10-CM | POA: Diagnosis not present

## 2017-12-20 DIAGNOSIS — G2581 Restless legs syndrome: Secondary | ICD-10-CM | POA: Diagnosis not present

## 2017-12-20 DIAGNOSIS — I129 Hypertensive chronic kidney disease with stage 1 through stage 4 chronic kidney disease, or unspecified chronic kidney disease: Secondary | ICD-10-CM | POA: Diagnosis not present

## 2017-12-20 DIAGNOSIS — H9193 Unspecified hearing loss, bilateral: Secondary | ICD-10-CM | POA: Diagnosis not present

## 2017-12-20 DIAGNOSIS — M810 Age-related osteoporosis without current pathological fracture: Secondary | ICD-10-CM | POA: Diagnosis not present

## 2017-12-20 DIAGNOSIS — E1143 Type 2 diabetes mellitus with diabetic autonomic (poly)neuropathy: Secondary | ICD-10-CM | POA: Diagnosis not present

## 2017-12-20 DIAGNOSIS — R1319 Other dysphagia: Secondary | ICD-10-CM | POA: Diagnosis not present

## 2017-12-20 DIAGNOSIS — M1711 Unilateral primary osteoarthritis, right knee: Secondary | ICD-10-CM | POA: Diagnosis not present

## 2017-12-20 DIAGNOSIS — Z7982 Long term (current) use of aspirin: Secondary | ICD-10-CM | POA: Diagnosis not present

## 2017-12-20 DIAGNOSIS — Z9181 History of falling: Secondary | ICD-10-CM | POA: Diagnosis not present

## 2017-12-20 DIAGNOSIS — E559 Vitamin D deficiency, unspecified: Secondary | ICD-10-CM | POA: Diagnosis not present

## 2017-12-20 DIAGNOSIS — Z87891 Personal history of nicotine dependence: Secondary | ICD-10-CM | POA: Diagnosis not present

## 2017-12-20 NOTE — Patient Outreach (Signed)
[facility]) Care Management  12/20/2017  Nicholas Barnett 02/03/1932 [id number]   Medication Adherence call to Nicholas Barnett spoke with patient's wife he still has medication for about a month she said she will order all his medication when he finished with what he has patient is due on Benazepril 5 mg,Pravastatin 20 mg Metformin 1000 mg. Nicholas Barnett is showing past due under [employer].   Nicholas Barnett CPhT Pharmacy Technician [facility] Care Management Direct Dial [phone]  Fax [phone] [email or web address]

## 2017-12-22 DIAGNOSIS — E559 Vitamin D deficiency, unspecified: Secondary | ICD-10-CM | POA: Diagnosis not present

## 2017-12-22 DIAGNOSIS — M19012 Primary osteoarthritis, left shoulder: Secondary | ICD-10-CM | POA: Diagnosis not present

## 2017-12-22 DIAGNOSIS — D519 Vitamin B12 deficiency anemia, unspecified: Secondary | ICD-10-CM | POA: Diagnosis not present

## 2017-12-22 DIAGNOSIS — Z87440 Personal history of urinary (tract) infections: Secondary | ICD-10-CM | POA: Diagnosis not present

## 2017-12-22 DIAGNOSIS — N182 Chronic kidney disease, stage 2 (mild): Secondary | ICD-10-CM | POA: Diagnosis not present

## 2017-12-22 DIAGNOSIS — M19011 Primary osteoarthritis, right shoulder: Secondary | ICD-10-CM | POA: Diagnosis not present

## 2017-12-22 DIAGNOSIS — Z7984 Long term (current) use of oral hypoglycemic drugs: Secondary | ICD-10-CM | POA: Diagnosis not present

## 2017-12-22 DIAGNOSIS — G214 Vascular parkinsonism: Secondary | ICD-10-CM | POA: Diagnosis not present

## 2017-12-22 DIAGNOSIS — M1711 Unilateral primary osteoarthritis, right knee: Secondary | ICD-10-CM | POA: Diagnosis not present

## 2017-12-22 DIAGNOSIS — H9193 Unspecified hearing loss, bilateral: Secondary | ICD-10-CM | POA: Diagnosis not present

## 2017-12-22 DIAGNOSIS — Z9181 History of falling: Secondary | ICD-10-CM | POA: Diagnosis not present

## 2017-12-22 DIAGNOSIS — Z87891 Personal history of nicotine dependence: Secondary | ICD-10-CM | POA: Diagnosis not present

## 2017-12-22 DIAGNOSIS — I129 Hypertensive chronic kidney disease with stage 1 through stage 4 chronic kidney disease, or unspecified chronic kidney disease: Secondary | ICD-10-CM | POA: Diagnosis not present

## 2017-12-22 DIAGNOSIS — E1143 Type 2 diabetes mellitus with diabetic autonomic (poly)neuropathy: Secondary | ICD-10-CM | POA: Diagnosis not present

## 2017-12-22 DIAGNOSIS — E1122 Type 2 diabetes mellitus with diabetic chronic kidney disease: Secondary | ICD-10-CM | POA: Diagnosis not present

## 2017-12-22 DIAGNOSIS — R1319 Other dysphagia: Secondary | ICD-10-CM | POA: Diagnosis not present

## 2017-12-22 DIAGNOSIS — G2581 Restless legs syndrome: Secondary | ICD-10-CM | POA: Diagnosis not present

## 2017-12-22 DIAGNOSIS — M810 Age-related osteoporosis without current pathological fracture: Secondary | ICD-10-CM | POA: Diagnosis not present

## 2017-12-22 DIAGNOSIS — M5033 Other cervical disc degeneration, cervicothoracic region: Secondary | ICD-10-CM | POA: Diagnosis not present

## 2017-12-22 DIAGNOSIS — Z7982 Long term (current) use of aspirin: Secondary | ICD-10-CM | POA: Diagnosis not present

## 2017-12-22 DIAGNOSIS — E43 Unspecified severe protein-calorie malnutrition: Secondary | ICD-10-CM | POA: Diagnosis not present

## 2017-12-22 DIAGNOSIS — I452 Bifascicular block: Secondary | ICD-10-CM | POA: Diagnosis not present

## 2017-12-23 ENCOUNTER — Ambulatory Visit: Payer: Medicare Other | Admitting: Cardiology

## 2017-12-24 ENCOUNTER — Ambulatory Visit: Payer: Medicare Other | Admitting: Cardiology

## 2017-12-24 ENCOUNTER — Encounter: Payer: Self-pay | Admitting: Cardiology

## 2017-12-24 VITALS — BP 156/93 | HR 96 | Resp 16 | Wt 147.0 lb

## 2017-12-24 DIAGNOSIS — I452 Bifascicular block: Secondary | ICD-10-CM | POA: Diagnosis not present

## 2017-12-24 DIAGNOSIS — R778 Other specified abnormalities of plasma proteins: Secondary | ICD-10-CM

## 2017-12-24 DIAGNOSIS — M19011 Primary osteoarthritis, right shoulder: Secondary | ICD-10-CM | POA: Diagnosis not present

## 2017-12-24 DIAGNOSIS — E43 Unspecified severe protein-calorie malnutrition: Secondary | ICD-10-CM | POA: Diagnosis not present

## 2017-12-24 DIAGNOSIS — M810 Age-related osteoporosis without current pathological fracture: Secondary | ICD-10-CM | POA: Diagnosis not present

## 2017-12-24 DIAGNOSIS — D519 Vitamin B12 deficiency anemia, unspecified: Secondary | ICD-10-CM | POA: Diagnosis not present

## 2017-12-24 DIAGNOSIS — Z87891 Personal history of nicotine dependence: Secondary | ICD-10-CM | POA: Diagnosis not present

## 2017-12-24 DIAGNOSIS — M5033 Other cervical disc degeneration, cervicothoracic region: Secondary | ICD-10-CM | POA: Diagnosis not present

## 2017-12-24 DIAGNOSIS — R7989 Other specified abnormal findings of blood chemistry: Secondary | ICD-10-CM | POA: Diagnosis not present

## 2017-12-24 DIAGNOSIS — M19012 Primary osteoarthritis, left shoulder: Secondary | ICD-10-CM | POA: Diagnosis not present

## 2017-12-24 DIAGNOSIS — I129 Hypertensive chronic kidney disease with stage 1 through stage 4 chronic kidney disease, or unspecified chronic kidney disease: Secondary | ICD-10-CM | POA: Diagnosis not present

## 2017-12-24 DIAGNOSIS — Z7984 Long term (current) use of oral hypoglycemic drugs: Secondary | ICD-10-CM | POA: Diagnosis not present

## 2017-12-24 DIAGNOSIS — Z9181 History of falling: Secondary | ICD-10-CM | POA: Diagnosis not present

## 2017-12-24 DIAGNOSIS — Z87440 Personal history of urinary (tract) infections: Secondary | ICD-10-CM | POA: Diagnosis not present

## 2017-12-24 DIAGNOSIS — R1319 Other dysphagia: Secondary | ICD-10-CM | POA: Diagnosis not present

## 2017-12-24 DIAGNOSIS — R1312 Dysphagia, oropharyngeal phase: Secondary | ICD-10-CM | POA: Diagnosis not present

## 2017-12-24 DIAGNOSIS — H9193 Unspecified hearing loss, bilateral: Secondary | ICD-10-CM | POA: Diagnosis not present

## 2017-12-24 DIAGNOSIS — J9601 Acute respiratory failure with hypoxia: Secondary | ICD-10-CM | POA: Diagnosis not present

## 2017-12-24 DIAGNOSIS — R55 Syncope and collapse: Secondary | ICD-10-CM

## 2017-12-24 DIAGNOSIS — E1143 Type 2 diabetes mellitus with diabetic autonomic (poly)neuropathy: Secondary | ICD-10-CM | POA: Diagnosis not present

## 2017-12-24 DIAGNOSIS — G214 Vascular parkinsonism: Secondary | ICD-10-CM | POA: Diagnosis not present

## 2017-12-24 DIAGNOSIS — G20 Parkinson's disease: Secondary | ICD-10-CM | POA: Diagnosis not present

## 2017-12-24 DIAGNOSIS — Z7982 Long term (current) use of aspirin: Secondary | ICD-10-CM | POA: Diagnosis not present

## 2017-12-24 DIAGNOSIS — E119 Type 2 diabetes mellitus without complications: Secondary | ICD-10-CM

## 2017-12-24 DIAGNOSIS — E559 Vitamin D deficiency, unspecified: Secondary | ICD-10-CM | POA: Diagnosis not present

## 2017-12-24 DIAGNOSIS — G2581 Restless legs syndrome: Secondary | ICD-10-CM | POA: Diagnosis not present

## 2017-12-24 DIAGNOSIS — I10 Essential (primary) hypertension: Secondary | ICD-10-CM | POA: Diagnosis not present

## 2017-12-24 DIAGNOSIS — E1122 Type 2 diabetes mellitus with diabetic chronic kidney disease: Secondary | ICD-10-CM | POA: Diagnosis not present

## 2017-12-24 DIAGNOSIS — G232 Striatonigral degeneration: Secondary | ICD-10-CM | POA: Diagnosis not present

## 2017-12-24 DIAGNOSIS — M1711 Unilateral primary osteoarthritis, right knee: Secondary | ICD-10-CM | POA: Diagnosis not present

## 2017-12-24 DIAGNOSIS — N182 Chronic kidney disease, stage 2 (mild): Secondary | ICD-10-CM | POA: Diagnosis not present

## 2017-12-24 MED ORDER — BENAZEPRIL HCL 5 MG PO TABS: 5.0000 mg | ORAL_TABLET | Freq: Every day | ORAL | 0 refills | Status: DC

## 2017-12-24 NOTE — Progress Notes (Signed)
12/24/2017 SY [name]   1931-04-17  [id number]  Primary Physician Nicholas Balo, DO Primary Cardiologist: Dr Delton See    84 year old gentleman with a past medical history significant for Parkinson's disease, memory loss, dysphagia, gait difficulty and previous recurrent falls, type 2 diabetes mellitus, hypertension, and hyperlipidemia who was admitted 11/21/17 after an episode of unwitnessed syncope. The patient was found on the floor of the bathroom by his wife. He does not recall what happened. In the ED he had an elevated Troponin-peak 0.64. Echo showed preserved LVF according to dr Delton See (difficult study).  It was felt the patient was minimally functional with prior ischemic strokes and alternating mental status, he had no symptoms of chest pain or shortness of breath and medical management was reccommended. His family agreed.  He had a similar episode in 2016 and was seen by Dr Johney Frame. Because of his co morbidities no further work up was recommended then either. He is in the office today for follow up. A Holter done 10/09- 10/22 as an OP showed no significant arrhythmia. The pt's family reports after the monitor was off he had another syncopal spell.  His wife says she had him standing up to get undressed for a shower (they use a lift to get him out of bed) when he collapsed without warning.  Once he was down she noted his B/P was elevated.  On further review he has had other episodes that sound like orthostatic syncope with supine hypertension.    Current Outpatient Medications  Medication Sig Dispense Refill  . aspirin EC 81 MG tablet Take 81 mg by mouth daily.    . benazepril (LOTENSIN) 5 MG tablet Take 1 tablet (5 mg total) by mouth at bedtime. 90 tablet 0  . Calcium Carbonate-Vitamin D (CALCIUM-CARB 600 + D) 600-125 MG-UNIT TABS Take 1 tablet by mouth 2 (two) times daily.     . Calcium Polycarbophil (FIBERCON PO) Take 4 tablets by mouth at bedtime.    . carbidopa-levodopa (SINEMET  IR) 25-100 MG tablet Take 3 tablets by mouth 2 (two) times daily. (Patient taking differently: Take 3 tablets by mouth See admin instructions. Take 3 tablets by mouth daily at noon and at 4pm) 540 tablet 3  . cyanocobalamin (,VITAMIN B-12,) 1000 MCG/ML injection Inject 1 mL (1,000 mcg total) into the muscle every 30 (thirty) days. Pt receives on the 15th of every month. (Patient taking differently: Inject 1,000 mcg into the muscle every 30 (thirty) days. On or about the 15th of each month) 1 mL 5  . diclofenac sodium (VOLTAREN) 1 % GEL Apply 4 g topically 4 (four) times daily. To affected knee (Patient taking differently: Apply 4 g topically 4 (four) times daily as needed (knee pain). ) 1 Tube 5  . gabapentin (NEURONTIN) 300 MG capsule TAKE 1 CAPSULE BY MOUTH AT  BEDTIME (Patient taking differently: Take 300 mg by mouth at bedtime. ) 90 capsule 0  . glucose blood (ONE TOUCH ULTRA TEST) test strip 1 each by Other route 3 (three) times daily. Dx: E11.43 100 each 11  . metFORMIN (GLUCOPHAGE) 1000 MG tablet TAKE 1 TABLET BY MOUTH TWO  TIMES DAILY WITH MEALS (Patient taking differently: Take 1,000 mg by mouth 2 (two) times daily with a meal. ) 180 tablet 2  . mirtazapine (REMERON) 30 MG tablet TAKE 1 TABLET BY MOUTH AT  BEDTIME (Patient taking differently: Take 30 mg by mouth at bedtime. ) 90 tablet 3  . mupirocin ointment (  BACTROBAN) 2 % APPLY  OINTMENT TOPICALLY AT BEDTIME TO  LESION  ON  SCROTUM (Patient taking differently: Apply 1 application topically See admin instructions. Apply ointment topically at bedtime to lesion on scrotum) 22 g 3  . ONETOUCH DELICA LANCETS FINE MISC Use to check blood glucose three times daily. Dx: E11.43 300 each 11  . OVER THE COUNTER MEDICATION Take 1 capsule by mouth at bedtime. CBD oil    . pantoprazole (PROTONIX) 40 MG tablet TAKE 1 TABLET BY MOUTH TWO  TIMES DAILY (Patient taking differently: Take 40 mg by mouth 2 (two) times daily. ) 180 tablet 2  . Polyethyl  Glycol-Propyl Glycol (SYSTANE OP) Place 1 drop into both eyes 2 (two) times daily.    . pravastatin (PRAVACHOL) 20 MG tablet TAKE 1 TABLET BY MOUTH AT  BEDTIME (Patient taking differently: Take 20 mg by mouth at bedtime. ) 90 tablet 3  . pyridOXINE (VITAMIN B-6) 100 MG tablet Take 100 mg by mouth daily.    . sodium phosphate (FLEET) 7-19 GM/118ML ENEM Place 1 enema rectally once a week.    . tamsulosin (FLOMAX) 0.4 MG CAPS capsule TAKE 1 CAPSULE BY MOUTH AT  BEDTIME (Patient taking differently: Take 0.4 mg by mouth at bedtime. ) 90 capsule 0   No current facility-administered medications for this visit.     Allergies  Allergen Reactions  . Caffeine Swelling and Other (See Comments)    Reaction:  Joint swelling    Past Medical History:  Diagnosis Date  . Abnormality of gait   . Arthritis   . Bifascicular block   . Cyanocobalamin deficiency   . Depression   . Diverticulitis   . Dizzy   . Dysphagia, idiopathic    History of aspiration  . Gait disturbance   . Hearing loss of both ears   . Hyperlipidemia   . Hypertension   . Lumbar back pain   . Memory loss   . Neck pain   . Osteoarthritis   . Osteoporosis   . Other and unspecified hyperlipidemia   . Parkinsonism (HCC)   . Recurrent falls    Using a cane and walker  . Restless leg   . Restless legs syndrome (RLS)   . Right knee pain   . Shoulder pain   . Thoracic back pain   . Thyroid disease   . Type 2 diabetes mellitus with autonomic neuropathy (HCC)   . Unspecified essential hypertension   . Unspecified hypothyroidism   . Vitamin D deficiency     Social History   Socioeconomic History  . Marital status: Married    Spouse name: Kathie Rhodes  . Number of children: 3  . Years of education: College  . Highest education level: Not on file  Occupational History  . Occupation: Retired    Associate Professor: RETIRED    Comment: Manager/ [employer]  . Financial resource strain: Not hard at all  . Food insecurity:      Worry: Never true    Inability: Never true  . Transportation needs:    Medical: No    Non-medical: No  Tobacco Use  . Smoking status: Former Smoker    Last attempt to quit: 01/31/1974    Years since quitting: 43.9  . Smokeless tobacco: Never Used  Substance and Sexual Activity  . Alcohol use: No    Alcohol/week: 0.0 standard drinks  . Drug use: No  . Sexual activity: Not Currently  Lifestyle  . Physical activity:  Days per week: 0 days    Minutes per session: 0 min  . Stress: To some extent  Relationships  . Social connections:    Talks on phone: More than three times a week    Gets together: More than three times a week    Attends religious service: Never    Active member of club or organization: No    Attends meetings of clubs or organizations: Never    Relationship status: Married  . Intimate partner violence:    Fear of current or ex partner: No    Emotionally abused: No    Physically abused: No    Forced sexual activity: No  Other Topics Concern  . Not on file  Social History Narrative   Married 1956. Returned from a Production designer, theatre/television/film for [employer].   Patient lives at home with his family.   Caffeine Use: none   Patient has a living will and healthcare power of attorney.     Family History  Problem Relation Age of Onset  . Heart disease Mother   . Diabetes Mother   . Stroke Mother   . Cancer Father        prostate  . Diabetes Sister   . Heart disease Brother      Review of Systems: General: negative for chills, fever, night sweats or weight changes.  Cardiovascular: negative for chest pain, dyspnea on exertion, edema, orthopnea, palpitations, paroxysmal nocturnal dyspnea or shortness of breath Dermatological: negative for rash Respiratory: negative for cough or wheezing Urologic: negative for hematuria Abdominal: negative for nausea, vomiting, diarrhea, bright red blood per rectum, melena, or hematemesis Neurologic: negative for visual changes, syncope, or  dizziness All other systems reviewed and are otherwise negative except as noted above.    Blood pressure (!) 156/93, pulse 96, resp. rate 16, weight 147 lb (66.7 kg), SpO2 96 %.  General appearance: alert, cooperative, cachectic, no distress and hearing aid in place Neck: no carotid bruit and no JVD Lungs: clear to auscultation bilaterally Heart: regular rate and rhythm Extremities: no edema Skin: cool pale dry Neurologic: Grossly normal   ASSESSMENT AND PLAN:   Syncope and collapse Admitted 11/21/17 after unwitnessed syncope. OP Holter 10/09-/12/14/17- NSR-60's, occasional PVCs. I suspect he has orhtostatic hypotension secondary to debilitation with poor vascular tone and central nevous sytem disease.   Troponin level elevated Seen in 2016 with atypical chest pain and again Oct 2019 after syncope- No further cardiac work up recommended secondary to co morbidities. Echo shows normal LVF  Parkinson disease (HCC) Significantly debilitated (see MRI)  Non-insulin dependent type 2 diabetes mellitus (HCC) On Glucophage   PLAN  I suggested they change his Benazepril to Q HS with his Flomax. I encouraged him to stay hydrated and educated the family on orthostatic hypotension. I doubt he would be a candidate for Northera secondary to advanced neuro vascular disease.  He can f/u PRN.   Nicholas Shelter PA-C 12/24/2017 4:44 PM

## 2017-12-24 NOTE — Assessment & Plan Note (Signed)
Significantly debilitated (see MRI)

## 2017-12-24 NOTE — Patient Instructions (Signed)
Medication Instructions:  TAKE your Benazepril (Lotensin) daily at bedtime  If you need a refill on your cardiac medications before your next appointment, please call your pharmacy.   Lab work: None  If you have labs (blood work) drawn today and your tests are completely normal, you will receive your results only by: Marland Kitchen MyChart Message (if you have MyChart) OR . A paper copy in the mail If you have any lab test that is abnormal or we need to change your treatment, we will call you to review the results.  Testing/Procedures: None   Follow-Up: At [facility], you and your health needs are our priority.  As part of our continuing mission to provide you with exceptional heart care, we have created designated Provider Care Teams.  These Care Teams include your primary Cardiologist (physician) and Advanced Practice Providers (APPs -  Physician Assistants and Nurse Practitioners) who all work together to provide you with the care you need, when you need it. . Your Provider recommends that you follow up on an as needed basis  Any Other Special Instructions Will Be Listed Below (If Applicable).

## 2017-12-24 NOTE — Assessment & Plan Note (Signed)
Admitted 11/21/17 after unwitnessed syncope. OP Holter 10/09-/12/14/17- NSR-60's, occasional PVCs. I suspect he has orhtostatic hypotension secondary to debilitation with poor vascular tone and central nevous sytem disease.

## 2017-12-24 NOTE — Assessment & Plan Note (Signed)
Seen in 2016 with atypical chest pain and again Oct 2019 after syncope- No further cardiac work up recommended secondary to co morbidities. Echo shows normal LVF

## 2017-12-24 NOTE — Assessment & Plan Note (Signed)
On Glucophage 

## 2017-12-25 DIAGNOSIS — R1312 Dysphagia, oropharyngeal phase: Secondary | ICD-10-CM | POA: Diagnosis not present

## 2017-12-25 DIAGNOSIS — G232 Striatonigral degeneration: Secondary | ICD-10-CM | POA: Diagnosis not present

## 2017-12-27 DIAGNOSIS — H9193 Unspecified hearing loss, bilateral: Secondary | ICD-10-CM | POA: Diagnosis not present

## 2017-12-27 DIAGNOSIS — G214 Vascular parkinsonism: Secondary | ICD-10-CM | POA: Diagnosis not present

## 2017-12-27 DIAGNOSIS — M810 Age-related osteoporosis without current pathological fracture: Secondary | ICD-10-CM | POA: Diagnosis not present

## 2017-12-27 DIAGNOSIS — Z9181 History of falling: Secondary | ICD-10-CM | POA: Diagnosis not present

## 2017-12-27 DIAGNOSIS — M5033 Other cervical disc degeneration, cervicothoracic region: Secondary | ICD-10-CM | POA: Diagnosis not present

## 2017-12-27 DIAGNOSIS — Z7984 Long term (current) use of oral hypoglycemic drugs: Secondary | ICD-10-CM | POA: Diagnosis not present

## 2017-12-27 DIAGNOSIS — G2581 Restless legs syndrome: Secondary | ICD-10-CM | POA: Diagnosis not present

## 2017-12-27 DIAGNOSIS — M19011 Primary osteoarthritis, right shoulder: Secondary | ICD-10-CM | POA: Diagnosis not present

## 2017-12-27 DIAGNOSIS — I452 Bifascicular block: Secondary | ICD-10-CM | POA: Diagnosis not present

## 2017-12-27 DIAGNOSIS — N182 Chronic kidney disease, stage 2 (mild): Secondary | ICD-10-CM | POA: Diagnosis not present

## 2017-12-27 DIAGNOSIS — I129 Hypertensive chronic kidney disease with stage 1 through stage 4 chronic kidney disease, or unspecified chronic kidney disease: Secondary | ICD-10-CM | POA: Diagnosis not present

## 2017-12-27 DIAGNOSIS — D519 Vitamin B12 deficiency anemia, unspecified: Secondary | ICD-10-CM | POA: Diagnosis not present

## 2017-12-27 DIAGNOSIS — E559 Vitamin D deficiency, unspecified: Secondary | ICD-10-CM | POA: Diagnosis not present

## 2017-12-27 DIAGNOSIS — M1711 Unilateral primary osteoarthritis, right knee: Secondary | ICD-10-CM | POA: Diagnosis not present

## 2017-12-27 DIAGNOSIS — M19012 Primary osteoarthritis, left shoulder: Secondary | ICD-10-CM | POA: Diagnosis not present

## 2017-12-27 DIAGNOSIS — Z7982 Long term (current) use of aspirin: Secondary | ICD-10-CM | POA: Diagnosis not present

## 2017-12-27 DIAGNOSIS — E43 Unspecified severe protein-calorie malnutrition: Secondary | ICD-10-CM | POA: Diagnosis not present

## 2017-12-27 DIAGNOSIS — Z87440 Personal history of urinary (tract) infections: Secondary | ICD-10-CM | POA: Diagnosis not present

## 2017-12-27 DIAGNOSIS — Z87891 Personal history of nicotine dependence: Secondary | ICD-10-CM | POA: Diagnosis not present

## 2017-12-27 DIAGNOSIS — R1319 Other dysphagia: Secondary | ICD-10-CM | POA: Diagnosis not present

## 2017-12-27 DIAGNOSIS — E1122 Type 2 diabetes mellitus with diabetic chronic kidney disease: Secondary | ICD-10-CM | POA: Diagnosis not present

## 2017-12-27 DIAGNOSIS — E1143 Type 2 diabetes mellitus with diabetic autonomic (poly)neuropathy: Secondary | ICD-10-CM | POA: Diagnosis not present

## 2017-12-28 DIAGNOSIS — M5033 Other cervical disc degeneration, cervicothoracic region: Secondary | ICD-10-CM | POA: Diagnosis not present

## 2017-12-28 DIAGNOSIS — N182 Chronic kidney disease, stage 2 (mild): Secondary | ICD-10-CM | POA: Diagnosis not present

## 2017-12-28 DIAGNOSIS — M19012 Primary osteoarthritis, left shoulder: Secondary | ICD-10-CM | POA: Diagnosis not present

## 2017-12-28 DIAGNOSIS — R1319 Other dysphagia: Secondary | ICD-10-CM | POA: Diagnosis not present

## 2017-12-28 DIAGNOSIS — Z87891 Personal history of nicotine dependence: Secondary | ICD-10-CM | POA: Diagnosis not present

## 2017-12-28 DIAGNOSIS — G2581 Restless legs syndrome: Secondary | ICD-10-CM | POA: Diagnosis not present

## 2017-12-28 DIAGNOSIS — E1122 Type 2 diabetes mellitus with diabetic chronic kidney disease: Secondary | ICD-10-CM | POA: Diagnosis not present

## 2017-12-28 DIAGNOSIS — E559 Vitamin D deficiency, unspecified: Secondary | ICD-10-CM | POA: Diagnosis not present

## 2017-12-28 DIAGNOSIS — Z9181 History of falling: Secondary | ICD-10-CM | POA: Diagnosis not present

## 2017-12-28 DIAGNOSIS — M810 Age-related osteoporosis without current pathological fracture: Secondary | ICD-10-CM | POA: Diagnosis not present

## 2017-12-28 DIAGNOSIS — M1711 Unilateral primary osteoarthritis, right knee: Secondary | ICD-10-CM | POA: Diagnosis not present

## 2017-12-28 DIAGNOSIS — M19011 Primary osteoarthritis, right shoulder: Secondary | ICD-10-CM | POA: Diagnosis not present

## 2017-12-28 DIAGNOSIS — Z7982 Long term (current) use of aspirin: Secondary | ICD-10-CM | POA: Diagnosis not present

## 2017-12-28 DIAGNOSIS — E1143 Type 2 diabetes mellitus with diabetic autonomic (poly)neuropathy: Secondary | ICD-10-CM | POA: Diagnosis not present

## 2017-12-28 DIAGNOSIS — D519 Vitamin B12 deficiency anemia, unspecified: Secondary | ICD-10-CM | POA: Diagnosis not present

## 2017-12-28 DIAGNOSIS — Z7984 Long term (current) use of oral hypoglycemic drugs: Secondary | ICD-10-CM | POA: Diagnosis not present

## 2017-12-28 DIAGNOSIS — Z87440 Personal history of urinary (tract) infections: Secondary | ICD-10-CM | POA: Diagnosis not present

## 2017-12-28 DIAGNOSIS — I452 Bifascicular block: Secondary | ICD-10-CM | POA: Diagnosis not present

## 2017-12-28 DIAGNOSIS — I129 Hypertensive chronic kidney disease with stage 1 through stage 4 chronic kidney disease, or unspecified chronic kidney disease: Secondary | ICD-10-CM | POA: Diagnosis not present

## 2017-12-28 DIAGNOSIS — G214 Vascular parkinsonism: Secondary | ICD-10-CM | POA: Diagnosis not present

## 2017-12-28 DIAGNOSIS — E43 Unspecified severe protein-calorie malnutrition: Secondary | ICD-10-CM | POA: Diagnosis not present

## 2017-12-28 DIAGNOSIS — H9193 Unspecified hearing loss, bilateral: Secondary | ICD-10-CM | POA: Diagnosis not present

## 2017-12-29 DIAGNOSIS — M810 Age-related osteoporosis without current pathological fracture: Secondary | ICD-10-CM | POA: Diagnosis not present

## 2017-12-29 DIAGNOSIS — Z7982 Long term (current) use of aspirin: Secondary | ICD-10-CM | POA: Diagnosis not present

## 2017-12-29 DIAGNOSIS — Z87440 Personal history of urinary (tract) infections: Secondary | ICD-10-CM | POA: Diagnosis not present

## 2017-12-29 DIAGNOSIS — E1143 Type 2 diabetes mellitus with diabetic autonomic (poly)neuropathy: Secondary | ICD-10-CM | POA: Diagnosis not present

## 2017-12-29 DIAGNOSIS — E1122 Type 2 diabetes mellitus with diabetic chronic kidney disease: Secondary | ICD-10-CM | POA: Diagnosis not present

## 2017-12-29 DIAGNOSIS — I452 Bifascicular block: Secondary | ICD-10-CM | POA: Diagnosis not present

## 2017-12-29 DIAGNOSIS — E43 Unspecified severe protein-calorie malnutrition: Secondary | ICD-10-CM | POA: Diagnosis not present

## 2017-12-29 DIAGNOSIS — E559 Vitamin D deficiency, unspecified: Secondary | ICD-10-CM | POA: Diagnosis not present

## 2017-12-29 DIAGNOSIS — D519 Vitamin B12 deficiency anemia, unspecified: Secondary | ICD-10-CM | POA: Diagnosis not present

## 2017-12-29 DIAGNOSIS — M19011 Primary osteoarthritis, right shoulder: Secondary | ICD-10-CM | POA: Diagnosis not present

## 2017-12-29 DIAGNOSIS — Z87891 Personal history of nicotine dependence: Secondary | ICD-10-CM | POA: Diagnosis not present

## 2017-12-29 DIAGNOSIS — G214 Vascular parkinsonism: Secondary | ICD-10-CM | POA: Diagnosis not present

## 2017-12-29 DIAGNOSIS — M5033 Other cervical disc degeneration, cervicothoracic region: Secondary | ICD-10-CM | POA: Diagnosis not present

## 2017-12-29 DIAGNOSIS — N182 Chronic kidney disease, stage 2 (mild): Secondary | ICD-10-CM | POA: Diagnosis not present

## 2017-12-29 DIAGNOSIS — Z9181 History of falling: Secondary | ICD-10-CM | POA: Diagnosis not present

## 2017-12-29 DIAGNOSIS — Z7984 Long term (current) use of oral hypoglycemic drugs: Secondary | ICD-10-CM | POA: Diagnosis not present

## 2017-12-29 DIAGNOSIS — M19012 Primary osteoarthritis, left shoulder: Secondary | ICD-10-CM | POA: Diagnosis not present

## 2017-12-29 DIAGNOSIS — H9193 Unspecified hearing loss, bilateral: Secondary | ICD-10-CM | POA: Diagnosis not present

## 2017-12-29 DIAGNOSIS — I129 Hypertensive chronic kidney disease with stage 1 through stage 4 chronic kidney disease, or unspecified chronic kidney disease: Secondary | ICD-10-CM | POA: Diagnosis not present

## 2017-12-29 DIAGNOSIS — M1711 Unilateral primary osteoarthritis, right knee: Secondary | ICD-10-CM | POA: Diagnosis not present

## 2017-12-29 DIAGNOSIS — R1319 Other dysphagia: Secondary | ICD-10-CM | POA: Diagnosis not present

## 2017-12-29 DIAGNOSIS — G2581 Restless legs syndrome: Secondary | ICD-10-CM | POA: Diagnosis not present

## 2017-12-29 NOTE — Progress Notes (Deleted)
Nicholas Barnett was seen today in the movement disorders clinic for neurologic consultation at the request of Reed, Tiffany L, DO.  The consultation is for the evaluation of Parkinsonism.  The patient has previously seen Dr. Erling Cruz and Dr. Leta Baptist.  Records were reviewed from both of those physicians.  The patient's symptoms started in 2010.  First symptoms consisted of dizziness and falls.  He began to see Dr. Erling Cruz around the time that symptoms began.  He first had an MRI of the brain in February 2011 that demonstrated rather significant atrophy.  B12 at that time was found to be low at 181.  He had a swallow study in June, 2011 that demonstrated moderate dysphagia with intermittent aspiration of thin liquids.  He was tried on levodopa (I do not know what dose and they don't even remember the medication or how long he was on it) and that did not seem to help.  He was later tried on ropinirole which also did not help.  He was tried on, and is still on, gabapentin for restless leg syndrome.  His most recent MRI of the brain was on 11/21/2013 and I reviewed this as well as his previous MRI of the brain.  There remains very significant atrophy, although there is not significant basal ganglia disease.  There is mild to moderate small vessel disease.  02/13/14 update:  Pt was seen today for UPDRS motor on/off testing.  He is accompanied by his family who supplements the hx.  Pt had MBE on 02/05/14 and demonstrated mild oral and mod pharyngeal phase dysphagia and mechanical soft with honey thick liquid was recommended.  He is still awaiting ST to contact him (out pt).  05/15/14 update:  The patient is accompanied by his family supplements the history.  Patient has a history of multiple system atrophy.  He has been minimally responsive to levodopa via UPDRS on/off last visit but they wanted to retry the medication at last visit and he was told to take 2 tablets at 11 AM (wakeup time) 2 at 3 PM and 1 at 7 PM.  Pt  states that last visit he walked in here with a walker and left without it after the on/off test.  Has felt much better ever since.  He was doing well in PT but pt states that the last time he went he got put on a machine that hurt his shoulder.  His wife does state that the levodopa has helped him remarkably.  He is now able to do much of his own dressing and bathing and he was not able to do that previously.  No falls since our last visit.  Pt states that he could not deal with the thickened food and understands the risks of not following the recommended diet.  He is, however, eating better and has a much better appetite.  He has gained some weight.  He did not get speech therapy; wife states that they were to call back to set this up but didn't.    09/18/14 update:  The patient presents today, accompanied by his wife who supplements the history.  The patients records were reviewed since last visit.  Despite the fact that the patient's UPDRS motor on/off test did not show much benefit with levodopa, the patient and his wife think that the levodopa has been very beneficial at home and helped freezing.  Therefore, he has been on carbidopa/levodopa 25/100, 2 tablets in the morning, 2 in the afternoon  and one towards evening.  He doesn't think that's its working as well but his wife thinks that hes not taking it as prescribed.  He was admitted to the hospital from June 4 to June 6 with atypical chest pain.  While he was in the hospital he had a modified barium swallow study done on 07/30/2014.  This recommended a continued dysphasia 3 diet, this time with thin liquids with strict precautions.  There was evidence of moderate pharyngeal phase dysphagia.  Pt admits that he isn't following the diet.  Pt states that about 3 weeks ago he fell backward and hit his head on a patio table.  Wife states that it didn't hit hard and no LOC.  No other falls.  He did have an injection in both shoulders recently and told that he  would be candidate for shoulder replacement.  His shoulders are still hurting him.  07/02/15 update:  The patient is following up today, accompanied by his wife who supplements the history.  I have not seen the patient since July, 2016.  At that point in time, his levodopa was increased from a total of 500 mg per day to 700 mg per day.  He was supposed to be on the carbidopa/levodopa 25/100, 3 tablets in the morning, 3 in the afternoon and one in the evening.  The patient states that he now takes 4 in the AM and doesn't take any after that as he thinks that it causes headache.  He admits that he d/c it for a while and had no headache.  He doesn't think that the medication helps walking but it did help his "trembling."  He admits to falls; one time got off golf cart and walking into store and fell in doorway.  Another time, fell out near golf cart and wife didn't find him for 1.5 hours.  Had a fall out of bed while sleeping.  Wife states that he is usually very still in the bed.  No yelling out in the night.  His wife does sleep in the same bed with him.  His wife called me a few weeks after I increased the dose to state that it was helpful.  He has refused physical therapy, primarily because of pain elsewhere.  His wife recently called to make a follow-up appointment stating that his anxiety and depression have increased.  He admits to being depressed.  His wife admits to being frustrated over the fact that he does not move.  10/03/15 update:  The patient follows up today, accompanied by his wife who supplements the history.  I have reviewed records available to me from other providers as well.  Last visit, the patient was only taking levodopa in the morning and he did not think it was helpful, so I told him he could discontinue it.  I also referred him to physical therapy, but he only went one time and thought it was for a wheelchair evaluation, and refused to go back.  His wife called me after the discontinuation  of levodopa and stated that he had gotten worse.  I said that he could go back on it, but the patient refused but then ended up going back on 3 po bid.  She had also noticed personality changes after he went off of it, but he refused psychiatric therapy.  She did not think that the personality changes were related to the addition of Remeron because the timing did not seem to correlate with that.  Ran out  of that 2 days ago.  Wife not sure that "it was strong enough."  Asks about whether or not dose should be increased.  Comes in with crutches today and states that he can walk with those but not with walker or cane.  No falls since our last visit.  Denies swallowing issues unless he tries to take all of his pills at once, including his large calcium pill.  02/25/16 update:  Pt f/u today, accompanied by his wife who supplements the history.  Still on carbidopa/levodopa 25/100, 3 po bid. He doesn't think it works but wife does.  Last visit, remeron increased to 30 mg daily.  Pt states that sleep is much better.  The records that were made available to me were reviewed since last visit.  Went to ER at end of November after going to UC with c/o malaise and fatigue.  UC stated that BP was low and he had new RBBB.  ER workup unremarkable and d/c home and f/u with NP two days later.  Pt states that he has been stable and pt states that he has been "stable lousy."  He is not exercising.  Wife states that he is supposed to be starting in home PT this month.  States that he tried forearm crutches and they made his bursitis worse than with the crutches.  Had one fall out of the bed.  Wife states that she was gone and she tried to get him up before she left but he refused; he then tried to get himself out of the bed and ended up falling and had to wait for family to come help him up.  He asks about a RX for a hospital bed.  He is in the bed 14 hours per day.  He cannot maneuver himself out of the bed and takes 2 urinals at night  to relieve himself while in the bed.  He still has to have a pad in the bed.  States that it just leaks and he cannot feel it.  Wife changes pad daily.    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  I have not seen him in a year and a half.  I have reviewed medical records made available to me.  This patient is accompanied in the office by his spouse who supplements the history.He has been seeing his primary care physician as well as a Publishing rights manager.  He continues to take carbidopa/levodopa 25/100, 3 tablets twice per day, despite the many discussions that this does not work on twice per day dosing.  In addition, he has vascular parkinsonism, and we have discussed in the past that levodopa may not be beneficial for this.  Wife states that pt doesn't walk any more except to transfer.  They describe start hesitation.  Did PT from December to feb.  Able to walk better then per wife but pt denies that.   In hospital bed but no longer able to use the trapeze and wife has to pull him up.  Rides scooter within the house.  Primary care notes indicate that the patient has had recurrent aspiration.  Patient has had multiple falls since our last visit.  He presented to the nurse practitioner June 21, 2017 with complaints of falls and weakness.  Klebsiella UTI was identified.  He was subsequently treated with Cipro.  He also had significant worsening of his renal function at that time.    12/30/17 update: Patient is seen today for follow-up from  his hospital stay in October.  I have reviewed the records.  The patient had an unwitnessed syncopal episode.  His wife came home from church and found the patient passed out in the shower.  The water was still running.  Patient remembers getting in the shower, but had no recollection of how he got on the floor.  He feels confident that he lost consciousness.  When his wife got there, he was able to communicate with her through nodding.  When he got to the  emergency room, he was also communicating with nodding yes and no and whispering.  He moves all 4 extremities and held them antigravity in the emergency room.  Troponin was elevated.  EEG was completed on September 30 and was normal.  MRI of the brain was completed.  I reviewed this.  Radiology indicated moderate to severe atrophy and "imaging findings of normal pressure hydrocephalus."  Cardiology was consulted and told the patient to follow-up as an outpatient to arrange for an outpatient event monitor.  He wore this.  This showed no significant arrhythmia.  The patient's family reported that after the monitor was taken off, he had another syncopal spell.  He was standing up to get undressed for a shower and he collapsed.  Cardiology PA felt that this was due to orthostatic hypotension.  It was suggested that they change his Benzapril to nighttime.  Neuroimaging has previously been performed.  It is available for my review.  I reviewed pts MRI brain from 11/21/13.  There was no evidence of BG disease but there was very significant atrophy.    PREVIOUS MEDICATIONS: Sinemet and Requip  ALLERGIES:   Allergies  Allergen Reactions  . Caffeine Swelling and Other (See Comments)    Reaction:  Joint swelling    CURRENT MEDICATIONS:  Outpatient Encounter Medications as of 12/30/2017  Medication Sig  . aspirin EC 81 MG tablet Take 81 mg by mouth daily.  . benazepril (LOTENSIN) 5 MG tablet Take 1 tablet (5 mg total) by mouth at bedtime.  . Calcium Carbonate-Vitamin D (CALCIUM-CARB 600 + D) 600-125 MG-UNIT TABS Take 1 tablet by mouth 2 (two) times daily.   . Calcium Polycarbophil (FIBERCON PO) Take 4 tablets by mouth at bedtime.  . carbidopa-levodopa (SINEMET IR) 25-100 MG tablet Take 3 tablets by mouth 2 (two) times daily. (Patient taking differently: Take 3 tablets by mouth See admin instructions. Take 3 tablets by mouth daily at noon and at 4pm)  . cyanocobalamin (,VITAMIN B-12,) 1000 MCG/ML injection  Inject 1 mL (1,000 mcg total) into the muscle every 30 (thirty) days. Pt receives on the 15th of every month. (Patient taking differently: Inject 1,000 mcg into the muscle every 30 (thirty) days. On or about the 15th of each month)  . diclofenac sodium (VOLTAREN) 1 % GEL Apply 4 g topically 4 (four) times daily. To affected knee (Patient taking differently: Apply 4 g topically 4 (four) times daily as needed (knee pain). )  . gabapentin (NEURONTIN) 300 MG capsule TAKE 1 CAPSULE BY MOUTH AT  BEDTIME (Patient taking differently: Take 300 mg by mouth at bedtime. )  . glucose blood (ONE TOUCH ULTRA TEST) test strip 1 each by Other route 3 (three) times daily. Dx: E11.43  . metFORMIN (GLUCOPHAGE) 1000 MG tablet TAKE 1 TABLET BY MOUTH TWO  TIMES DAILY WITH MEALS (Patient taking differently: Take 1,000 mg by mouth 2 (two) times daily with a meal. )  . mirtazapine (REMERON) 30 MG tablet TAKE 1 TABLET  BY MOUTH AT  BEDTIME (Patient taking differently: Take 30 mg by mouth at bedtime. )  . mupirocin ointment (BACTROBAN) 2 % APPLY  OINTMENT TOPICALLY AT BEDTIME TO  LESION  ON  SCROTUM (Patient taking differently: Apply 1 application topically See admin instructions. Apply ointment topically at bedtime to lesion on scrotum)  . ONETOUCH DELICA LANCETS FINE MISC Use to check blood glucose three times daily. Dx: E11.43  . OVER THE COUNTER MEDICATION Take 1 capsule by mouth at bedtime. CBD oil  . pantoprazole (PROTONIX) 40 MG tablet TAKE 1 TABLET BY MOUTH TWO  TIMES DAILY (Patient taking differently: Take 40 mg by mouth 2 (two) times daily. )  . Polyethyl Glycol-Propyl Glycol (SYSTANE OP) Place 1 drop into both eyes 2 (two) times daily.  . pravastatin (PRAVACHOL) 20 MG tablet TAKE 1 TABLET BY MOUTH AT  BEDTIME (Patient taking differently: Take 20 mg by mouth at bedtime. )  . pyridOXINE (VITAMIN B-6) 100 MG tablet Take 100 mg by mouth daily.  . sodium phosphate (FLEET) 7-19 GM/118ML ENEM Place 1 enema rectally once a week.   . tamsulosin (FLOMAX) 0.4 MG CAPS capsule TAKE 1 CAPSULE BY MOUTH AT  BEDTIME (Patient taking differently: Take 0.4 mg by mouth at bedtime. )   No facility-administered encounter medications on file as of 12/30/2017.     PAST MEDICAL HISTORY:   Past Medical History:  Diagnosis Date  . Abnormality of gait   . Arthritis   . Bifascicular block   . Cyanocobalamin deficiency   . Depression   . Diverticulitis   . Dizzy   . Dysphagia, idiopathic    History of aspiration  . Gait disturbance   . Hearing loss of both ears   . Hyperlipidemia   . Hypertension   . Lumbar back pain   . Memory loss   . Neck pain   . Osteoarthritis   . Osteoporosis   . Other and unspecified hyperlipidemia   . Parkinsonism (HCC)   . Recurrent falls    Using a cane and walker  . Restless leg   . Restless legs syndrome (RLS)   . Right knee pain   . Shoulder pain   . Thoracic back pain   . Thyroid disease   . Type 2 diabetes mellitus with autonomic neuropathy (HCC)   . Unspecified essential hypertension   . Unspecified hypothyroidism   . Vitamin D deficiency     PAST SURGICAL HISTORY:   Past Surgical History:  Procedure Laterality Date  . CATARACT EXTRACTION Bilateral 2012   Dr. Hazle Quant    SOCIAL HISTORY:   Social History   Socioeconomic History  . Marital status: Married    Spouse name: Kathie Rhodes  . Number of children: 3  . Years of education: College  . Highest education level: Not on file  Occupational History  . Occupation: Retired    Associate Professor: RETIRED    Comment: Manager/ [employer]  . Financial resource strain: Not hard at all  . Food insecurity:    Worry: Never true    Inability: Never true  . Transportation needs:    Medical: No    Non-medical: No  Tobacco Use  . Smoking status: Former Smoker    Last attempt to quit: 01/31/1974    Years since quitting: 43.9  . Smokeless tobacco: Never Used  Substance and Sexual Activity  . Alcohol use: No    Alcohol/week: 0.0  standard drinks  . Drug use: No  .  Sexual activity: Not Currently  Lifestyle  . Physical activity:    Days per week: 0 days    Minutes per session: 0 min  . Stress: To some extent  Relationships  . Social connections:    Talks on phone: More than three times a week    Gets together: More than three times a week    Attends religious service: Never    Active member of club or organization: No    Attends meetings of clubs or organizations: Never    Relationship status: Married  . Intimate partner violence:    Fear of current or ex partner: No    Emotionally abused: No    Physically abused: No    Forced sexual activity: No  Other Topics Concern  . Not on file  Social History Narrative   Married 1956. Returned from a Production designer, theatre/television/film for [employer].   Patient lives at home with his family.   Caffeine Use: none   Patient has a living will and healthcare power of attorney.    FAMILY HISTORY:   Family Status  Relation Name Status  . Mother  Deceased at age 90       stroke, MI  . Father  Deceased at age 63       prostate cancer  . Sister  Deceased at age 101       diabetic, cirrhosis  . Brother  Deceased at age 23 months       crib death  . Brother  Deceased at age 90       MI    ROS:  A complete 10 system review of systems was obtained and was unremarkable apart from what is mentioned above.  PHYSICAL EXAMINATION:    VITALS:   There were no vitals filed for this visit. Wt Readings from Last 3 Encounters:  12/24/17 147 lb (66.7 kg)  12/01/17 156 lb (70.8 kg)  11/24/17 151 lb 14.4 oz (68.9 kg)     GEN:  The patient appears stated age and is in NAD. HEENT:  Normocephalic, atraumatic.  The mucous membranes are moist. The superficial temporal arteries are without ropiness or tenderness. CV:  RRR Lungs:  CTAB Neck/HEME:  There are no carotid bruits bilaterally.  Neurological examination:  Orientation: The patient is alert and oriented x3.  He is slow to answer but is able.     Cranial nerves: There is good facial symmetry. There is facial hypomimia.  The visual fields are full to confrontational testing. The speech is fluent and clear but quite hypophonic. Soft palate rises symmetrically and there is no tongue deviation. Hearing is intact to conversational tone. Sensation: Sensation is intact to light touch throughout.   Movement examination: Tone: There is normal tone in the bilateral UE/LE Abnormal movements: none Coordination:  There is slow RAMs but not necessarily decremation. Gait and Station: The patient was given a gait belt and a walker.  With assist he arises fairly easily.  He is slow and slightly drags the right leg but he is able to ambulate on own with someone standing next to him.  He has a flexed posture.    Labs:   Chemistry      Component Value Date/Time   NA 137 11/23/2017 0433   NA 142 12/08/2016 1216   K 3.5 11/23/2017 0433   CL 104 11/23/2017 0433   CO2 27 11/23/2017 0433   BUN 22 11/23/2017 0433   BUN 28 (H) 12/08/2016 1216   CREATININE 1.04  11/23/2017 0433   CREATININE 1.08 11/02/2017 1119      Component Value Date/Time   CALCIUM 8.7 (L) 11/23/2017 0433   ALKPHOS 63 11/21/2017 1648   AST 27 11/21/2017 1648   ALT 15 11/21/2017 1648   BILITOT 1.1 11/21/2017 1648   BILITOT 0.4 12/08/2016 1216     Lab Results  Component Value Date   WBC 9.3 11/23/2017   HGB 12.2 (L) 11/23/2017   HCT 38.0 (L) 11/23/2017   MCV 97.2 11/23/2017   PLT 228 11/23/2017     ASSESSMENT/PLAN:  1.  Vascular parkinsonism  -Minimal response to levodopa on our UPDRS motor on/off test, but patient and his wife initially reported marked response to levodopa, even though I did not see that in the office.  When he stopped it, his wife thought he got much worse and she made him go back on it but he will only take it bid and he takes 3 po bid.   Have discussed many times that levodopa really does not work as a twice daily medication.  Reiterated this today.   He does not wake up until 11 AM.  Can take 2 tablets at 11 AM/2 tablets at 3 PM/2 tablets at 7 PM.  -I think that part of the issue we are seeing is nearly lack of motivation.  They told me he could not walk, but he was given a gait belt and walked down the hall fairly well.  Talked with the patient about the fact that his wife is not going to be able to lift him for long without hurting herself, which will ultimately land him in a nursing home if his caregiver cannot help him.  He was insistent that he does better with crutches, but I have seen him with crutches and that is just not the case.  Explained that he is a fall risk and crutches would not be appropriate.  He reports that he has been told this by his other providers as well, but he still thinks crutches are better.  -Reviewed MRI findings.  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  There were not significant changes between the 2 MRIs.  He has a lot of volume loss to account for the ventriculomegaly.  Discussed that we could do a high-volume lumbar puncture to see if that changes symptoms, but I am somewhat doubtful and doubt he would be a surgical candidate. 2.  Dysphagia.  -he had a modified barium swallow study done on 07/30/2014.  This recommended a continued dysphasia 3 diet, this time with thin liquids with strict precautions.  He is not doing that and understands the risks of morbidity and mortality.  He denies that he has this issue 3.  Anxiety, depression and some difficulty getting to sleep  -continue remeron 30 mg.  Pt/wife both think that it has been helpful.  Still on this and will continue 4.  Syncope  -***Agree that this is likely orthostatic in nature.  Cardiology is following and treating.

## 2017-12-30 ENCOUNTER — Ambulatory Visit: Payer: Medicare Other | Admitting: Neurology

## 2017-12-31 DIAGNOSIS — E1122 Type 2 diabetes mellitus with diabetic chronic kidney disease: Secondary | ICD-10-CM | POA: Diagnosis not present

## 2017-12-31 DIAGNOSIS — Z7984 Long term (current) use of oral hypoglycemic drugs: Secondary | ICD-10-CM | POA: Diagnosis not present

## 2017-12-31 DIAGNOSIS — D519 Vitamin B12 deficiency anemia, unspecified: Secondary | ICD-10-CM | POA: Diagnosis not present

## 2017-12-31 DIAGNOSIS — E1143 Type 2 diabetes mellitus with diabetic autonomic (poly)neuropathy: Secondary | ICD-10-CM | POA: Diagnosis not present

## 2017-12-31 DIAGNOSIS — E43 Unspecified severe protein-calorie malnutrition: Secondary | ICD-10-CM | POA: Diagnosis not present

## 2017-12-31 DIAGNOSIS — Z7982 Long term (current) use of aspirin: Secondary | ICD-10-CM | POA: Diagnosis not present

## 2017-12-31 DIAGNOSIS — G214 Vascular parkinsonism: Secondary | ICD-10-CM | POA: Diagnosis not present

## 2017-12-31 DIAGNOSIS — M19011 Primary osteoarthritis, right shoulder: Secondary | ICD-10-CM | POA: Diagnosis not present

## 2017-12-31 DIAGNOSIS — G2581 Restless legs syndrome: Secondary | ICD-10-CM | POA: Diagnosis not present

## 2017-12-31 DIAGNOSIS — I129 Hypertensive chronic kidney disease with stage 1 through stage 4 chronic kidney disease, or unspecified chronic kidney disease: Secondary | ICD-10-CM | POA: Diagnosis not present

## 2017-12-31 DIAGNOSIS — H9193 Unspecified hearing loss, bilateral: Secondary | ICD-10-CM | POA: Diagnosis not present

## 2017-12-31 DIAGNOSIS — N182 Chronic kidney disease, stage 2 (mild): Secondary | ICD-10-CM | POA: Diagnosis not present

## 2017-12-31 DIAGNOSIS — Z87891 Personal history of nicotine dependence: Secondary | ICD-10-CM | POA: Diagnosis not present

## 2017-12-31 DIAGNOSIS — M810 Age-related osteoporosis without current pathological fracture: Secondary | ICD-10-CM | POA: Diagnosis not present

## 2017-12-31 DIAGNOSIS — M1711 Unilateral primary osteoarthritis, right knee: Secondary | ICD-10-CM | POA: Diagnosis not present

## 2017-12-31 DIAGNOSIS — M5033 Other cervical disc degeneration, cervicothoracic region: Secondary | ICD-10-CM | POA: Diagnosis not present

## 2017-12-31 DIAGNOSIS — R1319 Other dysphagia: Secondary | ICD-10-CM | POA: Diagnosis not present

## 2017-12-31 DIAGNOSIS — Z9181 History of falling: Secondary | ICD-10-CM | POA: Diagnosis not present

## 2017-12-31 DIAGNOSIS — E559 Vitamin D deficiency, unspecified: Secondary | ICD-10-CM | POA: Diagnosis not present

## 2017-12-31 DIAGNOSIS — Z87440 Personal history of urinary (tract) infections: Secondary | ICD-10-CM | POA: Diagnosis not present

## 2017-12-31 DIAGNOSIS — M19012 Primary osteoarthritis, left shoulder: Secondary | ICD-10-CM | POA: Diagnosis not present

## 2017-12-31 DIAGNOSIS — I452 Bifascicular block: Secondary | ICD-10-CM | POA: Diagnosis not present

## 2018-01-03 ENCOUNTER — Other Ambulatory Visit: Payer: Self-pay

## 2018-01-03 ENCOUNTER — Observation Stay (HOSPITAL_COMMUNITY)
Admission: EM | Admit: 2018-01-03 | Discharge: 2018-01-04 | Disposition: A | Discharge status: Home / Self Care | Payer: Medicare Other | Attending: Internal Medicine | Admitting: Internal Medicine

## 2018-01-03 ENCOUNTER — Emergency Department (HOSPITAL_COMMUNITY): Payer: Medicare Other

## 2018-01-03 ENCOUNTER — Encounter (HOSPITAL_COMMUNITY): Payer: Self-pay | Admitting: Emergency Medicine

## 2018-01-03 DIAGNOSIS — Z79899 Other long term (current) drug therapy: Secondary | ICD-10-CM | POA: Diagnosis not present

## 2018-01-03 DIAGNOSIS — Z7982 Long term (current) use of aspirin: Secondary | ICD-10-CM | POA: Insufficient documentation

## 2018-01-03 DIAGNOSIS — M1990 Unspecified osteoarthritis, unspecified site: Secondary | ICD-10-CM | POA: Insufficient documentation

## 2018-01-03 DIAGNOSIS — E785 Hyperlipidemia, unspecified: Secondary | ICD-10-CM | POA: Diagnosis not present

## 2018-01-03 DIAGNOSIS — Z66 Do not resuscitate: Secondary | ICD-10-CM | POA: Diagnosis not present

## 2018-01-03 DIAGNOSIS — F329 Major depressive disorder, single episode, unspecified: Secondary | ICD-10-CM | POA: Diagnosis not present

## 2018-01-03 DIAGNOSIS — E43 Unspecified severe protein-calorie malnutrition: Secondary | ICD-10-CM | POA: Diagnosis not present

## 2018-01-03 DIAGNOSIS — D519 Vitamin B12 deficiency anemia, unspecified: Secondary | ICD-10-CM | POA: Diagnosis not present

## 2018-01-03 DIAGNOSIS — E538 Deficiency of other specified B group vitamins: Secondary | ICD-10-CM | POA: Insufficient documentation

## 2018-01-03 DIAGNOSIS — G4700 Insomnia, unspecified: Secondary | ICD-10-CM | POA: Diagnosis not present

## 2018-01-03 DIAGNOSIS — R1319 Other dysphagia: Secondary | ICD-10-CM | POA: Diagnosis not present

## 2018-01-03 DIAGNOSIS — H9193 Unspecified hearing loss, bilateral: Secondary | ICD-10-CM | POA: Diagnosis not present

## 2018-01-03 DIAGNOSIS — I10 Essential (primary) hypertension: Secondary | ICD-10-CM | POA: Diagnosis present

## 2018-01-03 DIAGNOSIS — I129 Hypertensive chronic kidney disease with stage 1 through stage 4 chronic kidney disease, or unspecified chronic kidney disease: Secondary | ICD-10-CM | POA: Insufficient documentation

## 2018-01-03 DIAGNOSIS — Z87440 Personal history of urinary (tract) infections: Secondary | ICD-10-CM | POA: Diagnosis not present

## 2018-01-03 DIAGNOSIS — G20 Parkinson's disease: Secondary | ICD-10-CM | POA: Insufficient documentation

## 2018-01-03 DIAGNOSIS — E559 Vitamin D deficiency, unspecified: Secondary | ICD-10-CM | POA: Insufficient documentation

## 2018-01-03 DIAGNOSIS — M19012 Primary osteoarthritis, left shoulder: Secondary | ICD-10-CM | POA: Diagnosis not present

## 2018-01-03 DIAGNOSIS — G2581 Restless legs syndrome: Secondary | ICD-10-CM | POA: Diagnosis not present

## 2018-01-03 DIAGNOSIS — R351 Nocturia: Secondary | ICD-10-CM | POA: Diagnosis not present

## 2018-01-03 DIAGNOSIS — R918 Other nonspecific abnormal finding of lung field: Secondary | ICD-10-CM | POA: Diagnosis not present

## 2018-01-03 DIAGNOSIS — Z8249 Family history of ischemic heart disease and other diseases of the circulatory system: Secondary | ICD-10-CM | POA: Insufficient documentation

## 2018-01-03 DIAGNOSIS — E1122 Type 2 diabetes mellitus with diabetic chronic kidney disease: Secondary | ICD-10-CM | POA: Insufficient documentation

## 2018-01-03 DIAGNOSIS — K219 Gastro-esophageal reflux disease without esophagitis: Secondary | ICD-10-CM | POA: Insufficient documentation

## 2018-01-03 DIAGNOSIS — N182 Chronic kidney disease, stage 2 (mild): Secondary | ICD-10-CM | POA: Insufficient documentation

## 2018-01-03 DIAGNOSIS — G232 Striatonigral degeneration: Secondary | ICD-10-CM | POA: Diagnosis present

## 2018-01-03 DIAGNOSIS — Z7984 Long term (current) use of oral hypoglycemic drugs: Secondary | ICD-10-CM | POA: Diagnosis not present

## 2018-01-03 DIAGNOSIS — N401 Enlarged prostate with lower urinary tract symptoms: Secondary | ICD-10-CM | POA: Insufficient documentation

## 2018-01-03 DIAGNOSIS — J189 Pneumonia, unspecified organism: Secondary | ICD-10-CM | POA: Diagnosis present

## 2018-01-03 DIAGNOSIS — Z9181 History of falling: Secondary | ICD-10-CM | POA: Diagnosis not present

## 2018-01-03 DIAGNOSIS — Z87891 Personal history of nicotine dependence: Secondary | ICD-10-CM | POA: Diagnosis not present

## 2018-01-03 DIAGNOSIS — J690 Pneumonitis due to inhalation of food and vomit: Secondary | ICD-10-CM | POA: Diagnosis not present

## 2018-01-03 DIAGNOSIS — I452 Bifascicular block: Secondary | ICD-10-CM | POA: Diagnosis not present

## 2018-01-03 DIAGNOSIS — M19011 Primary osteoarthritis, right shoulder: Secondary | ICD-10-CM | POA: Diagnosis not present

## 2018-01-03 DIAGNOSIS — R1310 Dysphagia, unspecified: Secondary | ICD-10-CM | POA: Diagnosis not present

## 2018-01-03 DIAGNOSIS — M810 Age-related osteoporosis without current pathological fracture: Secondary | ICD-10-CM | POA: Diagnosis not present

## 2018-01-03 DIAGNOSIS — E1143 Type 2 diabetes mellitus with diabetic autonomic (poly)neuropathy: Secondary | ICD-10-CM | POA: Diagnosis present

## 2018-01-03 DIAGNOSIS — E039 Hypothyroidism, unspecified: Secondary | ICD-10-CM | POA: Diagnosis not present

## 2018-01-03 DIAGNOSIS — G20C Parkinsonism, unspecified: Secondary | ICD-10-CM | POA: Diagnosis present

## 2018-01-03 DIAGNOSIS — G214 Vascular parkinsonism: Secondary | ICD-10-CM | POA: Diagnosis not present

## 2018-01-03 DIAGNOSIS — F0280 Dementia in other diseases classified elsewhere without behavioral disturbance: Secondary | ICD-10-CM | POA: Insufficient documentation

## 2018-01-03 DIAGNOSIS — M5033 Other cervical disc degeneration, cervicothoracic region: Secondary | ICD-10-CM | POA: Diagnosis not present

## 2018-01-03 DIAGNOSIS — M1711 Unilateral primary osteoarthritis, right knee: Secondary | ICD-10-CM | POA: Diagnosis not present

## 2018-01-03 LAB — CBC WITH DIFFERENTIAL/PLATELET
ABS IMMATURE GRANULOCYTES: 0.05 10*3/uL (ref 0.00–0.07)
Basophils Absolute: 0.1 10*3/uL (ref 0.0–0.1)
Basophils Relative: 0 %
Eosinophils Absolute: 0.1 10*3/uL (ref 0.0–0.5)
Eosinophils Relative: 1 %
HEMATOCRIT: 42.5 % (ref 39.0–52.0)
HEMOGLOBIN: 14 g/dL (ref 13.0–17.0)
Immature Granulocytes: 0 %
LYMPHS ABS: 1.1 10*3/uL (ref 0.7–4.0)
LYMPHS PCT: 9 %
MCH: 32.8 pg (ref 26.0–34.0)
MCHC: 32.9 g/dL (ref 30.0–36.0)
MCV: 99.5 fL (ref 80.0–100.0)
MONO ABS: 0.6 10*3/uL (ref 0.1–1.0)
MONOS PCT: 5 %
Neutro Abs: 11.1 10*3/uL — ABNORMAL HIGH (ref 1.7–7.7)
Neutrophils Relative %: 85 %
Platelets: 250 10*3/uL (ref 150–400)
RBC: 4.27 MIL/uL (ref 4.22–5.81)
RDW: 13.6 % (ref 11.5–15.5)
WBC: 13.1 10*3/uL — ABNORMAL HIGH (ref 4.0–10.5)
nRBC: 0 % (ref 0.0–0.2)

## 2018-01-03 LAB — COMPREHENSIVE METABOLIC PANEL
ALT: <5 U/L (ref 0–44)
AST: 18 U/L (ref 15–41)
Albumin: 3.9 g/dL (ref 3.5–5.0)
Alkaline Phosphatase: 57 U/L (ref 38–126)
Anion gap: 9 (ref 5–15)
BUN: 24 mg/dL — AB (ref 8–23)
CHLORIDE: 102 mmol/L (ref 98–111)
CO2: 29 mmol/L (ref 22–32)
Calcium: 9.8 mg/dL (ref 8.9–10.3)
Creatinine, Ser: 0.98 mg/dL (ref 0.61–1.24)
GFR calc Af Amer: >60 mL/min (ref >60)
Glucose, Bld: 103 mg/dL — ABNORMAL HIGH (ref 70–99)
POTASSIUM: 3.7 mmol/L (ref 3.5–5.1)
Sodium: 140 mmol/L (ref 135–145)
Total Bilirubin: 0.8 mg/dL (ref 0.3–1.2)
Total Protein: 6.9 g/dL (ref 6.5–8.1)

## 2018-01-03 LAB — I-STAT CG4 LACTIC ACID, ED
LACTIC ACID, VENOUS: 3.02 mmol/L — AB (ref 0.5–1.9)
LACTIC ACID, VENOUS: 0.62 mmol/L (ref 0.5–1.9)

## 2018-01-03 MED ORDER — SODIUM CHLORIDE 0.9 % IV SOLN
500.0000 mg | INTRAVENOUS | Status: DC
  Administered 2018-01-03: 500 mg via INTRAVENOUS
  Filled 2018-01-03: qty 500

## 2018-01-03 MED ORDER — SODIUM CHLORIDE 0.9 % IV BOLUS (SEPSIS)
1000.0000 mL | Freq: Once | INTRAVENOUS | Status: AC
  Administered 2018-01-03: 1000 mL via INTRAVENOUS

## 2018-01-03 MED ORDER — SODIUM CHLORIDE 0.9 % IV SOLN
2.0000 g | INTRAVENOUS | Status: DC
  Administered 2018-01-03: 2 g via INTRAVENOUS
  Filled 2018-01-03: qty 20

## 2018-01-03 MED ORDER — SODIUM CHLORIDE 0.9 % IV SOLN
1000.0000 mL | INTRAVENOUS | Status: DC
  Administered 2018-01-03: 1000 mL via INTRAVENOUS

## 2018-01-03 MED ORDER — SODIUM CHLORIDE 0.9 % IV BOLUS (SEPSIS)
250.0000 mL | Freq: Once | INTRAVENOUS | Status: AC
  Administered 2018-01-04: 250 mL via INTRAVENOUS

## 2018-01-03 NOTE — ED Provider Notes (Signed)
[facility]-EMERGENCY DEPT Provider Note   CSN: [id number] Arrival date & time: 01/03/18  1815     History   Chief Complaint Chief Complaint  Patient presents with  . Pneumonia    Dx Urgent Care    HPI Nicholas Barnett is a 84 y.o. male.  84 year old male presents with shortness of breath times several days.  Had fever several days ago which resolved until yesterday when he spiked a temperature.  Has had cough and congestion.  No vomiting or diarrhea.  No urinary symptoms.  No headache rashes or photophobia.  History of aspiration pneumonia due to Parkinson's disease and went to urgent care today and was diagnosed with pneumonia.  Was sent here for further management     Past Medical History:  Diagnosis Date  . Abnormality of gait   . Arthritis   . Bifascicular block   . Cyanocobalamin deficiency   . Depression   . Diverticulitis   . Dizzy   . Dysphagia, idiopathic    History of aspiration  . Gait disturbance   . Hearing loss of both ears   . Hyperlipidemia   . Hypertension   . Lumbar back pain   . Memory loss   . Neck pain   . Osteoarthritis   . Osteoporosis   . Other and unspecified hyperlipidemia   . Parkinsonism (HCC)   . Recurrent falls    Using a cane and walker  . Restless leg   . Restless legs syndrome (RLS)   . Right knee pain   . Shoulder pain   . Thoracic back pain   . Thyroid disease   . Type 2 diabetes mellitus with autonomic neuropathy (HCC)   . Unspecified essential hypertension   . Unspecified hypothyroidism   . Vitamin D deficiency     Patient Active Problem List   Diagnosis Date Noted  . Parkinson disease (HCC) 12/24/2017  . Protein-calorie malnutrition, severe 11/24/2017  . Syncope and collapse 11/21/2017  . Troponin level elevated   . Goals of care, counseling/discussion   . Palliative care by specialist   . Sepsis (HCC) 01/22/2017  . Acute respiratory failure with hypoxia (HCC) 01/22/2017  . Non-insulin  dependent type 2 diabetes mellitus (HCC) 01/22/2017  . Acute renal failure superimposed on stage 2 chronic kidney disease (HCC) 01/22/2017  . Aspiration pneumonia (HCC) 01/22/2017  . Fever, unspecified 10/16/2015  . Depression, major 10/16/2015  . GERD (gastroesophageal reflux disease) 04/17/2015  . UTI (urinary tract infection) 10/17/2014  . Pain in the chest   . Chest pain 07/28/2014  . Abnormal EKG 07/28/2014  . Insomnia 10/17/2013  . Pain in joint, shoulder region 01/25/2013  . Dysphagia, idiopathic   . Recurrent falls   . Dizzy   . Hearing loss of both ears   . Neck pain   . Right knee pain   . Thoracic back pain   . Lumbar back pain   . Shoulder pain   . Cyanocobalamin deficiency   . Type 2 diabetes mellitus with autonomic neuropathy (HCC)   . Restless legs syndrome (RLS)   . Parkinson's plus syndrome (HCC) 11/24/2012  . Gait difficulty 11/24/2012  . NOCTURIA 05/08/2009  . Memory loss 04/11/2009  . ECZEMA 03/21/2009  . BPH (benign prostatic hyperplasia) 12/25/2008  . OSTEOARTHRITIS, KNEE, RIGHT 12/25/2008  . VITAMIN D DEFICIENCY 12/22/2007  . DIVERTICULITIS, HX OF 06/01/2007  . ERECTILE DYSFUNCTION 12/16/2006  . Essential hypertension 12/16/2006    Past Surgical History:  Procedure  Laterality Date  . CATARACT EXTRACTION Bilateral 2012   Dr. Hazle Quant        Home Medications    Prior to Admission medications   Medication Sig Start Date End Date Taking? Authorizing Provider  aspirin EC 81 MG tablet Take 81 mg by mouth daily.    [provider]  benazepril (LOTENSIN) 5 MG tablet Take 1 tablet (5 mg total) by mouth at bedtime. 12/24/17   Abelino Derrick, PA-C  Calcium Carbonate-Vitamin D (CALCIUM-CARB 600 + D) 600-125 MG-UNIT TABS Take 1 tablet by mouth 2 (two) times daily.     [provider]  Calcium Polycarbophil (FIBERCON PO) Take 4 tablets by mouth at bedtime.    [provider]  carbidopa-levodopa (SINEMET IR) 25-100 MG tablet Take 3  tablets by mouth 2 (two) times daily. Patient taking differently: Take 3 tablets by mouth See admin instructions. Take 3 tablets by mouth daily at noon and at 4pm 05/31/17   Reed, Tiffany L, DO  cyanocobalamin (,VITAMIN B-12,) 1000 MCG/ML injection Inject 1 mL (1,000 mcg total) into the muscle every 30 (thirty) days. Pt receives on the 15th of every month. Patient taking differently: Inject 1,000 mcg into the muscle every 30 (thirty) days. On or about the 15th of each month 03/31/16   Kimber Relic, MD  diclofenac sodium (VOLTAREN) 1 % GEL Apply 4 g topically 4 (four) times daily. To affected knee Patient taking differently: Apply 4 g topically 4 (four) times daily as needed (knee pain).  03/24/17   Reed, Tiffany L, DO  gabapentin (NEURONTIN) 300 MG capsule TAKE 1 CAPSULE BY MOUTH AT  BEDTIME Patient taking differently: Take 300 mg by mouth at bedtime.  10/18/17   Reed, Tiffany L, DO  glucose blood (ONE TOUCH ULTRA TEST) test strip 1 each by Other route 3 (three) times daily. Dx: E11.43 10/21/17   Reed, Tiffany L, DO  metFORMIN (GLUCOPHAGE) 1000 MG tablet TAKE 1 TABLET BY MOUTH TWO  TIMES DAILY WITH MEALS Patient taking differently: Take 1,000 mg by mouth 2 (two) times daily with a meal.  07/16/17   Reed, Tiffany L, DO  mirtazapine (REMERON) 30 MG tablet TAKE 1 TABLET BY MOUTH AT  BEDTIME Patient taking differently: Take 30 mg by mouth at bedtime.  07/16/17   Reed, Tiffany L, DO  mupirocin ointment (BACTROBAN) 2 % APPLY  OINTMENT TOPICALLY AT BEDTIME TO  LESION  ON  SCROTUM Patient taking differently: Apply 1 application topically See admin instructions. Apply ointment topically at bedtime to lesion on scrotum 10/20/17   Renato Gails, Tiffany L, DO  ONETOUCH DELICA LANCETS FINE MISC Use to check blood glucose three times daily. Dx: E11.43 10/21/17   Reed, Tiffany L, DO  OVER THE COUNTER MEDICATION Take 1 capsule by mouth at bedtime. CBD oil    [provider]  pantoprazole (PROTONIX) 40 MG tablet TAKE 1  TABLET BY MOUTH TWO  TIMES DAILY Patient taking differently: Take 40 mg by mouth 2 (two) times daily.  07/16/17   Reed, Tiffany L, DO  Polyethyl Glycol-Propyl Glycol (SYSTANE OP) Place 1 drop into both eyes 2 (two) times daily.    [provider]  pravastatin (PRAVACHOL) 20 MG tablet TAKE 1 TABLET BY MOUTH AT  BEDTIME Patient taking differently: Take 20 mg by mouth at bedtime.  07/16/17   Reed, Tiffany L, DO  pyridOXINE (VITAMIN B-6) 100 MG tablet Take 100 mg by mouth daily.    [provider]  sodium phosphate (FLEET)  7-19 GM/118ML ENEM Place 1 enema rectally once a week.    [provider]  tamsulosin (FLOMAX) 0.4 MG CAPS capsule TAKE 1 CAPSULE BY MOUTH AT  BEDTIME Patient taking differently: Take 0.4 mg by mouth at bedtime.  10/18/17   [name], DO    Family History Family History  Problem Relation Age of Onset  . Heart disease Mother   . Diabetes Mother   . Stroke Mother   . Cancer Father        prostate  . Diabetes Sister   . Heart disease Brother     Social History Social History   Tobacco Use  . Smoking status: Former Smoker    Last attempt to quit: 01/31/1974    Years since quitting: 43.9  . Smokeless tobacco: Never Used  Substance Use Topics  . Alcohol use: No    Alcohol/week: 0.0 standard drinks  . Drug use: No     Allergies   Caffeine   Review of Systems Review of Systems  All other systems reviewed and are negative.    Physical Exam Updated Vital Signs BP (!) 157/98 (BP Location: Left Arm)   Pulse 87   Temp 98 F (36.7 C) (Oral)   Resp 16   Ht 1.854 m (6\' 1" )   Wt 66.2 kg   SpO2 95%   BMI 19.26 kg/m   Physical Exam  Constitutional: He is oriented to person, place, and time. He appears well-developed and well-nourished.  Non-toxic appearance. No distress.  HENT:  Head: Normocephalic and atraumatic.  Eyes: Pupils are equal, round, and reactive to light. Conjunctivae, EOM and lids are normal.  Neck: Normal range  of motion. Neck supple. No tracheal deviation present. No thyroid mass present.  Cardiovascular: Normal rate, regular rhythm and normal heart sounds. Exam reveals no gallop.  No murmur heard. Pulmonary/Chest: Effort normal and breath sounds normal. No stridor. No respiratory distress. He has no decreased breath sounds. He has no wheezes. He has no rhonchi. He has no rales.  Abdominal: Soft. Normal appearance and bowel sounds are normal. He exhibits no distension. There is no tenderness. There is no rebound and no CVA tenderness.  Musculoskeletal: Normal range of motion. He exhibits no edema or tenderness.  Neurological: He is alert and oriented to person, place, and time. He has normal strength. No cranial nerve deficit or sensory deficit. GCS eye subscore is 4. GCS verbal subscore is 5. GCS motor subscore is 6.  Skin: Skin is warm and dry. No abrasion and no rash noted.  Psychiatric: He has a normal mood and affect. His speech is normal and behavior is normal.  Nursing note and vitals reviewed.    ED Treatments / Results  Labs (all labs ordered are listed, but only abnormal results are displayed) Labs Reviewed  CULTURE, BLOOD (ROUTINE X 2)  CULTURE, BLOOD (ROUTINE X 2)  COMPREHENSIVE METABOLIC PANEL  CBC WITH DIFFERENTIAL/PLATELET  URINALYSIS, ROUTINE W REFLEX MICROSCOPIC  I-STAT CG4 LACTIC ACID, ED    EKG EKG Interpretation  Date/Time:  Monday January 03 2018 21:51:21 EST Ventricular Rate:  75 PR Interval:    QRS Duration: 154 QT Interval:  381 QTC Calculation: 426 R Axis:   -76 Text Interpretation:  Sinus rhythm RBBB and LAFB Probable left ventricular hypertrophy Confirmed by Lorre Nick ([id number]) on 01/03/2018 10:15:29 PM   Radiology No results found.  Procedures Procedures (including critical care time)  Medications Ordered in ED Medications  0.9 %  sodium chloride infusion (  has no administration in time range)     Initial Impression / Assessment and Plan / ED  Course  I have reviewed the triage vital signs and the nursing notes.  Pertinent labs & imaging results that were available during my care of the patient were reviewed by me and considered in my medical decision making (see chart for details).     Patient started on IV fluids as well as IV antibiotics.  Chest x-ray confirms that patient has pneumonia.  Patient's lactate was elevated.  Blood pressures remained stable here.  Will be admitted to the hospitalist service  Final Clinical Impressions(s) / ED Diagnoses   Final diagnoses:  None    ED Discharge Orders    None       Lorre Nick, MD 01/03/18 2328

## 2018-01-03 NOTE — Patient Outreach (Signed)
[facility]) Care Management  01/03/2018  KAYVON MO 11-22-1931 [id number]   Medication Adherence call to Mr. Taite Schoeppner spoke with patient's wife she explain they still has medication and they received from [facility] she said they had switch insurance and end up with  extras when switching insurance. Mr. Dupler is showing past due under [employer].   Lillia Abed CPhT Pharmacy Technician [facility] Care Management Direct Dial [phone]  Fax [phone] [email or web address]

## 2018-01-03 NOTE — ED Notes (Signed)
I gave critical I Stat CG4 result to MD Freida Busman

## 2018-01-03 NOTE — ED Notes (Addendum)
Patient transported to X-ray 

## 2018-01-03 NOTE — ED Triage Notes (Signed)
Pt presents from the urgent care with complaints of fever and pneumonia diagnosed by CXR at the urgent care. Patient was recently discharged from [facility] last month with aspiration pneumonia. Patient had a fever last night and a fever last week accompanied by a cough with green sputum.

## 2018-01-03 NOTE — ED Notes (Addendum)
#  22 Ga IV placed right upper forearm x 1 attempt, blood culture drawn with Kurin device, flushed and saline locked. Patient tolerated well.   Xray at bedside to take patient.

## 2018-01-04 ENCOUNTER — Encounter (HOSPITAL_COMMUNITY): Payer: Self-pay | Admitting: Internal Medicine

## 2018-01-04 DIAGNOSIS — J189 Pneumonia, unspecified organism: Secondary | ICD-10-CM | POA: Diagnosis not present

## 2018-01-04 DIAGNOSIS — I10 Essential (primary) hypertension: Secondary | ICD-10-CM | POA: Diagnosis not present

## 2018-01-04 DIAGNOSIS — E1143 Type 2 diabetes mellitus with diabetic autonomic (poly)neuropathy: Secondary | ICD-10-CM

## 2018-01-04 LAB — CBC
HCT: 37.8 % — ABNORMAL LOW (ref 39.0–52.0)
HEMOGLOBIN: 12 g/dL — AB (ref 13.0–17.0)
MCH: 32.2 pg (ref 26.0–34.0)
MCHC: 31.7 g/dL (ref 30.0–36.0)
MCV: 101.3 fL — AB (ref 80.0–100.0)
NRBC: 0 % (ref 0.0–0.2)
PLATELETS: 211 10*3/uL (ref 150–400)
RBC: 3.73 MIL/uL — ABNORMAL LOW (ref 4.22–5.81)
RDW: 13.6 % (ref 11.5–15.5)
WBC: 6.7 10*3/uL (ref 4.0–10.5)

## 2018-01-04 LAB — URINALYSIS, ROUTINE W REFLEX MICROSCOPIC
BILIRUBIN URINE: NEGATIVE
Glucose, UA: NEGATIVE mg/dL
Hgb urine dipstick: NEGATIVE
KETONES UR: 5 mg/dL — AB
LEUKOCYTES UA: NEGATIVE
NITRITE: NEGATIVE
Protein, ur: NEGATIVE mg/dL
Specific Gravity, Urine: 1.021 (ref 1.005–1.030)
pH: 6 (ref 5.0–8.0)

## 2018-01-04 LAB — BASIC METABOLIC PANEL
ANION GAP: 5 (ref 5–15)
BUN: 20 mg/dL (ref 8–23)
CALCIUM: 8.5 mg/dL — AB (ref 8.9–10.3)
CO2: 30 mmol/L (ref 22–32)
Chloride: 108 mmol/L (ref 98–111)
Creatinine, Ser: 0.91 mg/dL (ref 0.61–1.24)
GFR calc Af Amer: >60 mL/min (ref >60)
GFR calc non Af Amer: >60 mL/min (ref >60)
Glucose, Bld: 93 mg/dL (ref 70–99)
POTASSIUM: 3.9 mmol/L (ref 3.5–5.1)
Sodium: 143 mmol/L (ref 135–145)

## 2018-01-04 LAB — STREP PNEUMONIAE URINARY ANTIGEN: STREP PNEUMO URINARY ANTIGEN: NEGATIVE

## 2018-01-04 LAB — GLUCOSE, CAPILLARY: GLUCOSE-CAPILLARY: 81 mg/dL (ref 70–99)

## 2018-01-04 MED ORDER — BENAZEPRIL HCL 5 MG PO TABS: 5.0000 mg | ORAL_TABLET | Freq: Every day | ORAL | Status: DC

## 2018-01-04 MED ORDER — TAMSULOSIN HCL 0.4 MG PO CAPS: 0.4000 mg | ORAL_CAPSULE | Freq: Every day | ORAL | Status: DC

## 2018-01-04 MED ORDER — AMOXICILLIN-POT CLAVULANATE 875-125 MG PO TABS: 1.0000 | ORAL_TABLET | Freq: Two times a day (BID) | ORAL | 0 refills | Status: DC

## 2018-01-04 MED ORDER — GABAPENTIN 300 MG PO CAPS: 300.0000 mg | ORAL_CAPSULE | Freq: Every day | ORAL | Status: DC

## 2018-01-04 MED ORDER — SODIUM CHLORIDE 0.9 % IV SOLN
INTRAVENOUS | Status: DC
  Administered 2018-01-04: 04:00:00 via INTRAVENOUS

## 2018-01-04 MED ORDER — CARBIDOPA-LEVODOPA 25-100 MG PO TABS
3.0000 | ORAL_TABLET | ORAL | Status: DC
  Administered 2018-01-04: 3 via ORAL
  Filled 2018-01-04: qty 3

## 2018-01-04 MED ORDER — VANCOMYCIN HCL 10 G IV SOLR
1500.0000 mg | Freq: Once | INTRAVENOUS | Status: AC
  Administered 2018-01-04: 1500 mg via INTRAVENOUS
  Filled 2018-01-04: qty 1500

## 2018-01-04 MED ORDER — AMOXICILLIN-POT CLAVULANATE 875-125 MG PO TABS: 1.0000 | ORAL_TABLET | Freq: Two times a day (BID) | ORAL | 0 refills | Status: AC

## 2018-01-04 MED ORDER — ONDANSETRON HCL 4 MG PO TABS: 4.0000 mg | ORAL_TABLET | Freq: Four times a day (QID) | ORAL | Status: DC | PRN

## 2018-01-04 MED ORDER — CALCIUM POLYCARBOPHIL 625 MG PO TABS
625.0000 mg | ORAL_TABLET | Freq: Every day | ORAL | Status: DC
  Filled 2018-01-04 (×2): qty 1

## 2018-01-04 MED ORDER — VANCOMYCIN HCL 10 G IV SOLR: 1250.0000 mg | INTRAVENOUS | Status: DC

## 2018-01-04 MED ORDER — ACETAMINOPHEN 650 MG RE SUPP: 650.0000 mg | Freq: Four times a day (QID) | RECTAL | Status: DC | PRN

## 2018-01-04 MED ORDER — PANTOPRAZOLE SODIUM 40 MG PO TBEC
40.0000 mg | DELAYED_RELEASE_TABLET | Freq: Two times a day (BID) | ORAL | Status: DC
  Administered 2018-01-04: 40 mg via ORAL
  Filled 2018-01-04: qty 1

## 2018-01-04 MED ORDER — VITAMIN B-6 100 MG PO TABS
100.0000 mg | ORAL_TABLET | Freq: Every day | ORAL | Status: DC
  Administered 2018-01-04: 100 mg via ORAL
  Filled 2018-01-04 (×2): qty 1

## 2018-01-04 MED ORDER — ONDANSETRON HCL 4 MG/2ML IJ SOLN: 4.0000 mg | Freq: Four times a day (QID) | INTRAMUSCULAR | Status: DC | PRN

## 2018-01-04 MED ORDER — SODIUM CHLORIDE 0.9 % IV SOLN
1.0000 g | Freq: Three times a day (TID) | INTRAVENOUS | Status: DC
  Administered 2018-01-04: 1 g via INTRAVENOUS
  Filled 2018-01-04 (×3): qty 1

## 2018-01-04 MED ORDER — CALCIUM CARBONATE-VITAMIN D 500-200 MG-UNIT PO TABS
1.0000 | ORAL_TABLET | Freq: Two times a day (BID) | ORAL | Status: DC
  Administered 2018-01-04: 1 via ORAL
  Filled 2018-01-04 (×2): qty 1

## 2018-01-04 MED ORDER — ENOXAPARIN SODIUM 40 MG/0.4ML ~~LOC~~ SOLN
40.0000 mg | Freq: Every day | SUBCUTANEOUS | Status: DC
  Administered 2018-01-04: 40 mg via SUBCUTANEOUS
  Filled 2018-01-04 (×2): qty 0.4

## 2018-01-04 MED ORDER — ACETAMINOPHEN 325 MG PO TABS: 650.0000 mg | ORAL_TABLET | Freq: Four times a day (QID) | ORAL | Status: DC | PRN

## 2018-01-04 MED ORDER — INSULIN ASPART 100 UNIT/ML ~~LOC~~ SOLN: 0.0000 [IU] | Freq: Three times a day (TID) | SUBCUTANEOUS | Status: DC

## 2018-01-04 MED ORDER — ASPIRIN EC 81 MG PO TBEC
81.0000 mg | DELAYED_RELEASE_TABLET | Freq: Every day | ORAL | Status: DC
  Administered 2018-01-04: 81 mg via ORAL
  Filled 2018-01-04: qty 1

## 2018-01-04 MED ORDER — MIRTAZAPINE 15 MG PO TABS: 30.0000 mg | ORAL_TABLET | Freq: Every day | ORAL | Status: DC

## 2018-01-04 MED ORDER — PRAVASTATIN SODIUM 20 MG PO TABS: 20.0000 mg | ORAL_TABLET | Freq: Every day | ORAL | Status: DC

## 2018-01-04 NOTE — Progress Notes (Signed)
Pharmacy Antibiotic Note  EULICE RUTLEDGE is a 84 y.o. male admitted on 01/03/2018 with pneumonia.  Pharmacy has been consulted for vancomycin dosing.  Plan: Cefepime 1 Gm IV q8h (MD) Vancomycin 1500 mg x1 then 1250 mg IV q24h for est AUC = 517 Goal AUC = 400-500 F/u scr/cultures/levels  Height: 6\' 1"  (185.4 cm) Weight: 147 lb (66.7 kg) IBW/kg (Calculated) : 79.9  Temp (24hrs), Avg:98 F (36.7 C), Min:98 F (36.7 C), Max:98 F (36.7 C)  Recent Labs  Lab 01/03/18 2101 01/03/18 2137 01/03/18 2345  WBC 13.1*  --   --   CREATININE 0.98  --   --   LATICACIDVEN  --  3.02* 0.62    Estimated Creatinine Clearance: 52.9 mL/min (by C-G formula based on SCr of 0.98 mg/dL).    Allergies  Allergen Reactions  . Caffeine Swelling and Other (See Comments)    Reaction:  Joint swelling    Antimicrobials this admission: 11/11 zmax/roc >> x1  ED 11/12 cefepime >>  11/12 vancomycin >>  Dose adjustments this admission:   Microbiology results:  BCx:   UCx:    Sputum:    MRSA PCR:   Thank you for allowing pharmacy to be a part of this patient's care.  Lorenza Evangelist 01/04/2018 3:44 AM

## 2018-01-04 NOTE — ED Notes (Signed)
Urine and culture sent to lab  

## 2018-01-04 NOTE — Discharge Summary (Signed)
Physician Discharge Summary  Nicholas Barnett:[id number] DOB: 09/08/1931 DOA: 01/03/2018  PCP: [name], DO  Admit date: 01/03/2018 Discharge date: 01/04/2018  Admitted From: Home Disposition:  Home  Recommendations for Outpatient Follow-up:  1. Follow up with PCP in 1-2 weeks 2. Please obtain BMP/CBC in one week and follow blood cultures ordered 01/04/2018   Home Health: Yes Equipment/Devices: None  Discharge Condition: stable CODE STATUS: DNR Diet recommendation:  Dysphagia   Brief/Interim Summary:  #) Aspiration pneumonia: Patient has had recurrent aspiration pneumonia likely secondary to progressive dementia.  His family understands this and they report that they have tried a dysphagia diet but patient does not tolerated well.  Patient presented again with aspiration pneumonia and was started on IV cefepime and vancomycin.  Cultures were ordered.  Family requested patient be discharged home as he does become progressively worse in the hospital.  He was started on Augmentin and discharged home.  #) Hypertension/hyperlipidemia: Patient was continued on benazepril, pravastatin  #) Parkinson's disease: Patient was continued on home Sinemet.  #) Pain/psych: Patient was continued on home gabapentin and mirtazapine.  #) BPH: Patient was continued on home tamsulosin.  #) Multiple supplements: Patient was continued on home calcium carbonate, cyanocobalamin, paroxetine.  Discharge Diagnoses:  Principal Problem:   HCAP (healthcare-associated pneumonia) Active Problems:   Essential hypertension   Parkinson's plus syndrome (HCC)   Type 2 diabetes mellitus with autonomic neuropathy [facility])    Discharge Instructions  Discharge Instructions    Call MD for:  difficulty breathing, headache or visual disturbances   Complete by:  As directed    Call MD for:  hives   Complete by:  As directed    Call MD for:  persistant dizziness or light-headedness   Complete by:  As  directed    Call MD for:  persistant nausea and vomiting   Complete by:  As directed    Call MD for:  redness, tenderness, or signs of infection (pain, swelling, redness, odor or green/yellow discharge around incision site)   Complete by:  As directed    Call MD for:  severe uncontrolled pain   Complete by:  As directed    Call MD for:  temperature >100.4   Complete by:  As directed    Diet - low sodium heart healthy   Complete by:  As directed    Discharge instructions   Complete by:  As directed    Please take your antibiotics as prescribed.  Please follow-up with your primary care doctor in 1 week.   Increase activity slowly   Complete by:  As directed      Allergies as of 01/04/2018      Reactions   Caffeine Swelling, Other (See Comments)   Reaction:  Joint swelling      Medication List    TAKE these medications   amoxicillin-clavulanate 875-125 MG tablet Commonly known as:  AUGMENTIN Take 1 tablet by mouth 2 (two) times daily for 7 days.   aspirin EC 81 MG tablet Take 81 mg by mouth daily.   benazepril 5 MG tablet Commonly known as:  LOTENSIN Take 1 tablet (5 mg total) by mouth at bedtime.   CALCIUM-CARB 600 + D 600-125 MG-UNIT Tabs Generic drug:  Calcium Carbonate-Vitamin D Take 1 tablet by mouth 2 (two) times daily.   carbidopa-levodopa 25-100 MG tablet Commonly known as:  SINEMET IR Take 3 tablets by mouth 2 (two) times daily. What changed:    when to take  this  additional instructions   cyanocobalamin 1000 MCG/ML injection Commonly known as:  (VITAMIN B-12) Inject 1 mL (1,000 mcg total) into the muscle every 30 (thirty) days. Pt receives on the 15th of every month. What changed:  additional instructions   diclofenac sodium 1 % Gel Commonly known as:  VOLTAREN Apply 4 g topically 4 (four) times daily. To affected knee   FIBERCON PO Take 4 tablets by mouth at bedtime.   gabapentin 300 MG capsule Commonly known as:  NEURONTIN TAKE 1 CAPSULE BY  MOUTH AT  BEDTIME   glucose blood test strip 1 each by Other route 3 (three) times daily. Dx: E11.43   metFORMIN 1000 MG tablet Commonly known as:  GLUCOPHAGE TAKE 1 TABLET BY MOUTH TWO  TIMES DAILY WITH MEALS What changed:  See the new instructions.   mirtazapine 30 MG tablet Commonly known as:  REMERON TAKE 1 TABLET BY MOUTH AT  BEDTIME What changed:    how much to take  how to take this  when to take this  additional instructions   mupirocin ointment 2 % Commonly known as:  BACTROBAN APPLY  OINTMENT TOPICALLY AT BEDTIME TO  LESION  ON  SCROTUM What changed:  See the new instructions.   ONETOUCH DELICA LANCETS FINE Misc Use to check blood glucose three times daily. Dx: E11.43   pantoprazole 40 MG tablet Commonly known as:  PROTONIX TAKE 1 TABLET BY MOUTH TWO  TIMES DAILY   pravastatin 20 MG tablet Commonly known as:  PRAVACHOL TAKE 1 TABLET BY MOUTH AT  BEDTIME What changed:    how much to take  how to take this  when to take this  additional instructions   pyridOXINE 100 MG tablet Commonly known as:  VITAMIN B-6 Take 100 mg by mouth daily.   sodium phosphate 7-19 GM/118ML Enem Place 1 enema rectally once a week.   SYSTANE OP Place 1 drop into both eyes 2 (two) times daily.   tamsulosin 0.4 MG Caps capsule Commonly known as:  FLOMAX TAKE 1 CAPSULE BY MOUTH AT  BEDTIME       Allergies  Allergen Reactions  . Caffeine Swelling and Other (See Comments)    Reaction:  Joint swelling    Consultations:  None   Procedures/Studies: Dg Chest 2 View  Result Date: 01/03/2018 CLINICAL DATA:  Acute onset of fever. Concern for pneumonia. EXAM: CHEST - 2 VIEW COMPARISON:  Chest radiograph performed 11/21/2017 FINDINGS: The lungs are well-aerated. Mild right basilar airspace opacity raises concern for pneumonia, given clinical concern. There is no evidence of pleural effusion or pneumothorax. The heart is normal in size; the mediastinal contour is  within normal limits. No acute osseous abnormalities are seen. IMPRESSION: Mild right basilar airspace opacity raises concern for pneumonia, given clinical concern. Electronically Signed   By: Roanna Raider M.D.   On: 01/03/2018 23:20       Subjective:   Discharge Exam: Vitals:   01/04/18 0800 01/04/18 0847  BP: (!) 165/86 (!) 179/85  Pulse: (!) 56 (!) 54  Resp: 15 17  Temp:  97.6 F (36.4 C)  SpO2: 98% 100%   Vitals:   01/04/18 0700 01/04/18 0730 01/04/18 0800 01/04/18 0847  BP: (!) 187/90 (!) 165/86 (!) 165/86 (!) 179/85  Pulse: 62 (!) 56 (!) 56 (!) 54  Resp: 15 16 15 17   Temp:    97.6 F (36.4 C)  TempSrc:    Oral  SpO2: 99% 100% 98% 100%  Weight:  Height:        General exam: Appears calm and comfortable  Respiratory system: Clear to auscultation. Respiratory effort normal.  Scattered rhonchi Cardiovascular system: Regular rate and rhythm, no murmurs Gastrointestinal system: Soft, nondistended, no rebound or guarding, plus bowel sounds d. Central nervous system: Alert and oriented. No focal neurological deficits. Extremities: Lower extremity edema Skin: No rashes, over visible skin Psychiatry: Judgement and insight appear normal. Mood & affect appropriate.    The results of significant diagnostics from this hospitalization (including imaging, microbiology, ancillary and laboratory) are listed below for reference.     Microbiology: Recent Results (from the past 240 hour(s))  Blood Culture (routine x 2)     Status: None (Preliminary result)   Collection Time: 01/03/18  9:15 PM  Result Value Ref Range Status   Specimen Description   Final    BLOOD RIGHT WRIST Performed at [facility], [address], [place], [place] [address]    Special Requests   Final    BOTTLES DRAWN AEROBIC AND ANAEROBIC Blood Culture adequate volume Performed at [facility], [address], [place], [place] [address]    Culture   Final     NO GROWTH < 24 HOURS Performed at [facility], [address] [place], [place] [address]    Report Status PENDING  Incomplete     Labs: BNP (last 3 results) Recent Labs    01/22/17 1720  BNP 216.2*   Basic Metabolic Panel: Recent Labs  Lab 01/03/18 2101 01/04/18 0814  NA 140 143  K 3.7 3.9  CL 102 108  CO2 29 30  GLUCOSE 103* 93  BUN 24* 20  CREATININE 0.98 0.91  CALCIUM 9.8 8.5*   Liver Function Tests: Recent Labs  Lab 01/03/18 2101  AST 18  ALT <5  ALKPHOS 57  BILITOT 0.8  PROT 6.9  ALBUMIN 3.9   No results for input(s): LIPASE, AMYLASE in the last 168 hours. No results for input(s): AMMONIA in the last 168 hours. CBC: Recent Labs  Lab 01/03/18 2101 01/04/18 0814  WBC 13.1* 6.7  NEUTROABS 11.1*  --   HGB 14.0 12.0*  HCT 42.5 37.8*  MCV 99.5 101.3*  PLT 250 211   Cardiac Enzymes: No results for input(s): CKTOTAL, CKMB, CKMBINDEX, TROPONINI in the last 168 hours. BNP: Invalid input(s): POCBNP CBG: Recent Labs  Lab 01/04/18 1146  GLUCAP 81   D-Dimer No results for input(s): DDIMER in the last 72 hours. Hgb A1c No results for input(s): HGBA1C in the last 72 hours. Lipid Profile No results for input(s): CHOL, HDL, LDLCALC, TRIG, CHOLHDL, LDLDIRECT in the last 72 hours. Thyroid function studies No results for input(s): TSH, T4TOTAL, T3FREE, THYROIDAB in the last 72 hours.  Invalid input(s): FREET3 Anemia work up No results for input(s): VITAMINB12, FOLATE, FERRITIN, TIBC, IRON, RETICCTPCT in the last 72 hours. Urinalysis    Component Value Date/Time   COLORURINE YELLOW 01/04/2018 0102   APPEARANCEUR CLEAR 01/04/2018 0102   APPEARANCEUR Cloudy (A) 08/21/2013 1345   LABSPEC 1.021 01/04/2018 0102   PHURINE 6.0 01/04/2018 0102   GLUCOSEU NEGATIVE 01/04/2018 0102   HGBUR NEGATIVE 01/04/2018 0102   HGBUR 1+ 03/21/2009 1105   BILIRUBINUR NEGATIVE 01/04/2018 0102   BILIRUBINUR small (A) 12/08/2016 1508   BILIRUBINUR Large 10/09/2014  1549   BILIRUBINUR Negative 08/21/2013 1345   KETONESUR 5 (A) 01/04/2018 0102   PROTEINUR NEGATIVE 01/04/2018 0102   UROBILINOGEN 0.2 12/08/2016 1508   UROBILINOGEN  1.0 11/04/2012 1458   NITRITE NEGATIVE 01/04/2018 0102   LEUKOCYTESUR NEGATIVE 01/04/2018 0102   LEUKOCYTESUR 1+ (A) 08/21/2013 1345   Sepsis Labs Invalid input(s): PROCALCITONIN,  WBC,  LACTICIDVEN Microbiology Recent Results (from the past 240 hour(s))  Blood Culture (routine x 2)     Status: None (Preliminary result)   Collection Time: 01/03/18  9:15 PM  Result Value Ref Range Status   Specimen Description   Final    BLOOD RIGHT WRIST Performed at [facility], [address] [place], [place] [address]    Special Requests   Final    BOTTLES DRAWN AEROBIC AND ANAEROBIC Blood Culture adequate volume Performed at [facility], [address] [place], [place] [address]    Culture   Final    NO GROWTH < 24 HOURS Performed at [facility], [address] [place], [place] [address]    Report Status PENDING  Incomplete     Time coordinating discharge: 35  SIGNED:   Delaine LameShrey C Loan Oguin, MD  [facility] 01/04/2018, 1:31 PM  If 7PM-7AM, please contact night-coverage [email or web address] Password [email or web address]

## 2018-01-04 NOTE — Discharge Instructions (Signed)
Aspiration Pneumonia °Aspiration pneumonia is an infection in your lungs. It occurs when food, liquid, or stomach contents (vomit) are inhaled (aspirated) into your lungs. When these things get into your lungs, swelling (inflammation) and infection can occur. This can make it difficult for you to breathe. Aspiration pneumonia is a serious condition and can be life threatening. °What increases the risk? °Aspiration pneumonia is more likely to occur when a person's cough (gag) reflex or ability to swallow has been decreased. Some things that can do this include: °· Having a brain injury or disease, such as stroke, seizures, Parkinson's disease, dementia, or amyotrophic lateral sclerosis (ALS). °· Being given general anesthetic for procedures. °· Being in a coma (unconscious). °· Having a narrowing of the tube that carries food to the stomach (esophagus). °· Drinking too much alcohol. If a person passes out and vomits, vomit can be swallowed into the lungs. °· Taking certain medicines, such as tranquilizers or sedatives. ° °What are the signs or symptoms? °· Coughing after swallowing food or liquids. °· Breathing problems, such as wheezing or shortness of breath. °· Bluish skin. This can be caused by lack of oxygen. °· Coughing up food or mucus. The mucus might contain blood, greenish material, or yellowish-white fluid (pus). °· Fever. °· Chest pain. °· Being more tired than usual (fatigue). °· Sweating more than usual. °· Bad breath. °How is this diagnosed? °A physical exam will be done. During the exam, the health care provider will listen to your lungs with a stethoscope to check for: °· Crackling sounds in the lungs. °· Decreased breath sounds. °· A rapid heartbeat. ° °Various tests may be ordered. These may include: °· Chest X-ray. °· CT scan. °· Swallowing study. This test looks at how food is swallowed and whether it goes into your breathing tube (trachea) or food pipe (esophagus). °· Sputum culture. Saliva and  mucus (sputum) are collected from the lungs or the tubes that carry air to the lungs (bronchi). The sputum is then tested for bacteria. °· Bronchoscopy. This test uses a flexible tube (bronchoscope) to see inside the lungs. ° °How is this treated? °Treatment will usually include antibiotic medicines. Other medicines may also be used to reduce fever or pain. You may need to be treated in the hospital. In the hospital, your breathing will be carefully monitored. Depending on how well you are breathing, you may need to be given oxygen, or you may need breathing support from a breathing machine (ventilator). For people who fail a swallowing study, a feeding tube might be placed in the stomach, or they may be asked to avoid certain food textures or liquids when they eat. °Follow these instructions at home: °· Carefully follow any special eating instructions you were given, such as avoiding certain food textures or thickening liquids. This reduces the risk of developing aspiration pneumonia again. °· Only take over-the-counter or prescription medicines as directed by your health care provider. Follow the directions carefully. °· If you were prescribed antibiotics, take them as directed. Finish them even if you start to feel better. °· Rest as instructed by your health care provider. °· Keep all follow-up appointments with your health care provider. °Contact a health care provider if: °· You develop worsening shortness of breath, wheezing, or difficulty breathing. °· You develop a fever. °· You have chest pain. °This information is not intended to replace advice given to you by your health care provider. Make sure you discuss any questions you have with your health care   provider. °Document Released: 12/07/2008 Document Revised: 07/24/2015 Document Reviewed: 07/28/2012 °Elsevier Interactive Patient Education © 2017 [employer] ° °

## 2018-01-04 NOTE — Care Management Obs Status (Signed)
MEDICARE OBSERVATION STATUS NOTIFICATION   Patient Details  Name: Suan HalterDavid L Karam MRN: [id number] Date of Birth: 1931-11-08   Medicare Observation Status Notification Given:  Yes    Golda AcreDavis, Rhonda Lynn, RN 01/04/2018, 2:22 PM

## 2018-01-04 NOTE — ED Notes (Signed)
Report called to Onalee Hua, RN on 5E.

## 2018-01-04 NOTE — H&P (Addendum)
History and Physical    Nicholas Barnett:[id number] DOB: January 09, 1932 DOA: 01/03/2018  PCP: [name], DO  Patient coming from: Home.  Chief Complaint: Fever and increasing cough.  HPI: Nicholas Barnett is a 84 y.o. male with history of Parkinson's disease, hypertension, diabetes mellitus, B12 deficiency, BPH was admitted last month for syncope has been following up with cardiologist at event monitor placed and during last admission patient also had aspiration pneumonia and swallow test done and recommended regular diet with thin liquids has been having increasing shortness of breath productive cough for last 1 week.  Patient's family recorded fever last week and again day before yesterday and yesterday and had gone to urgent care and was instructed to come to the ER.  ED Course: In the ER on exam patient has coarse breath sounds with chest x-ray showing infiltrates and WBC count of 13.1 initial lactate was 3 which improved to 1 after hydration.  Given the symptoms patient was started on empiric antibiotics and admitted for further observation.  Review of Systems: As per HPI, rest all negative.   Past Medical History:  Diagnosis Date  . Abnormality of gait   . Arthritis   . Bifascicular block   . Cyanocobalamin deficiency   . Depression   . Diverticulitis   . Dizzy   . Dysphagia, idiopathic    History of aspiration  . Gait disturbance   . Hearing loss of both ears   . Hyperlipidemia   . Hypertension   . Lumbar back pain   . Memory loss   . Neck pain   . Osteoarthritis   . Osteoporosis   . Other and unspecified hyperlipidemia   . Parkinsonism (HCC)   . Recurrent falls    Using a cane and walker  . Restless leg   . Restless legs syndrome (RLS)   . Right knee pain   . Shoulder pain   . Thoracic back pain   . Thyroid disease   . Type 2 diabetes mellitus with autonomic neuropathy (HCC)   . Unspecified essential hypertension   . Unspecified hypothyroidism   . Vitamin  D deficiency     Past Surgical History:  Procedure Laterality Date  . CATARACT EXTRACTION Bilateral 2012   Dr. Hazle Quant     reports that he quit smoking about 43 years ago. He has never used smokeless tobacco. He reports that he does not drink alcohol or use drugs.  Allergies  Allergen Reactions  . Caffeine Swelling and Other (See Comments)    Reaction:  Joint swelling    Family History  Problem Relation Age of Onset  . Heart disease Mother   . Diabetes Mother   . Stroke Mother   . Cancer Father        prostate  . Diabetes Sister   . Heart disease Brother     Prior to Admission medications   Medication Sig Start Date End Date Taking? Authorizing Provider  aspirin EC 81 MG tablet Take 81 mg by mouth daily.   Yes [provider]  benazepril (LOTENSIN) 5 MG tablet Take 1 tablet (5 mg total) by mouth at bedtime. 12/24/17  Yes Kilroy, Eda Paschal, PA-C  Calcium Carbonate-Vitamin D (CALCIUM-CARB 600 + D) 600-125 MG-UNIT TABS Take 1 tablet by mouth 2 (two) times daily.    Yes [provider]  Calcium Polycarbophil (FIBERCON PO) Take 4 tablets by mouth at bedtime.   Yes [provider]  carbidopa-levodopa (SINEMET IR) 25-100  MG tablet Take 3 tablets by mouth 2 (two) times daily. Patient taking differently: Take 3 tablets by mouth See admin instructions. Take 3 tablets by mouth daily at noon and at 4pm 05/31/17  Yes Reed, Tiffany L, DO  cyanocobalamin (,VITAMIN B-12,) 1000 MCG/ML injection Inject 1 mL (1,000 mcg total) into the muscle every 30 (thirty) days. Pt receives on the 15th of every month. Patient taking differently: Inject 1,000 mcg into the muscle every 30 (thirty) days. On or about the 15th of each month 03/31/16  Yes Kimber Relic, MD  gabapentin (NEURONTIN) 300 MG capsule TAKE 1 CAPSULE BY MOUTH AT  BEDTIME Patient taking differently: Take 300 mg by mouth at bedtime.  10/18/17  Yes Reed, Tiffany L, DO  metFORMIN (GLUCOPHAGE) 1000 MG tablet TAKE 1 TABLET BY  MOUTH TWO  TIMES DAILY WITH MEALS Patient taking differently: Take 1,000 mg by mouth 2 (two) times daily with a meal.  07/16/17  Yes Reed, Tiffany L, DO  mirtazapine (REMERON) 30 MG tablet TAKE 1 TABLET BY MOUTH AT  BEDTIME Patient taking differently: Take 30 mg by mouth at bedtime.  07/16/17  Yes Reed, Tiffany L, DO  mupirocin ointment (BACTROBAN) 2 % APPLY  OINTMENT TOPICALLY AT BEDTIME TO  LESION  ON  SCROTUM Patient taking differently: Apply 1 application topically 2 (two) times daily. Apply ointment topically at bedtime to lesion on scrotum 10/20/17  Yes Reed, Tiffany L, DO  pantoprazole (PROTONIX) 40 MG tablet TAKE 1 TABLET BY MOUTH TWO  TIMES DAILY Patient taking differently: Take 40 mg by mouth 2 (two) times daily.  07/16/17  Yes Reed, Tiffany L, DO  Polyethyl Glycol-Propyl Glycol (SYSTANE OP) Place 1 drop into both eyes 2 (two) times daily.   Yes [provider]  pravastatin (PRAVACHOL) 20 MG tablet TAKE 1 TABLET BY MOUTH AT  BEDTIME Patient taking differently: Take 20 mg by mouth at bedtime.  07/16/17  Yes Reed, Tiffany L, DO  pyridOXINE (VITAMIN B-6) 100 MG tablet Take 100 mg by mouth daily.   Yes [provider]  sodium phosphate (FLEET) 7-19 GM/118ML ENEM Place 1 enema rectally once a week.   Yes [provider]  tamsulosin (FLOMAX) 0.4 MG CAPS capsule TAKE 1 CAPSULE BY MOUTH AT  BEDTIME Patient taking differently: Take 0.4 mg by mouth at bedtime.  10/18/17  Yes Reed, Tiffany L, DO  diclofenac sodium (VOLTAREN) 1 % GEL Apply 4 g topically 4 (four) times daily. To affected knee Patient not taking: Reported on 01/03/2018 03/24/17   Reed, Tiffany L, DO  glucose blood (ONE TOUCH ULTRA TEST) test strip 1 each by Other route 3 (three) times daily. Dx: E11.43 10/21/17   Reed, Tiffany L, DO  ONETOUCH DELICA LANCETS FINE MISC Use to check blood glucose three times daily. Dx: E11.43 10/21/17   [name], DO    Physical Exam: Vitals:   01/03/18 2349 01/04/18 0030  01/04/18 0130 01/04/18 0230  BP:  136/76 138/84 (!) 153/81  Pulse:  74 67 62  Resp:  (!) 25 (!) 21 (!) 25  Temp:      TempSrc:      SpO2:  99% 97% 98%  Weight: 66.7 kg     Height: 6\' 1"  (1.854 m)         Constitutional: Moderately built and nourished. Vitals:   01/03/18 2349 01/04/18 0030 01/04/18 0130 01/04/18 0230  BP:  136/76 138/84 (!) 153/81  Pulse:  74 67 62  Resp:  Marland Kitchen)  25 (!) 21 (!) 25  Temp:      TempSrc:      SpO2:  99% 97% 98%  Weight: 66.7 kg     Height: 6\' 1"  (1.854 m)      Eyes: Anicteric no pallor. ENMT: No discharge from the ears eyes nose or mouth. Neck: No mass or.  No neck rigidity. Respiratory: No rhonchi or crepitations. Cardiovascular: S1-S2 heard no murmurs appreciated. Abdomen: Soft nontender bowel sounds present. Musculoskeletal: No edema.  No joint effusion. Skin: No rash. Neurologic: Alert awake oriented to time place and person.  Moves all extremities but appears weak. Psychiatric: Appears normal.   Labs on Admission: I have personally reviewed following labs and imaging studies  CBC: Recent Labs  Lab 01/03/18 2101  WBC 13.1*  NEUTROABS 11.1*  HGB 14.0  HCT 42.5  MCV 99.5  PLT 250   Basic Metabolic Panel: Recent Labs  Lab 01/03/18 2101  NA 140  K 3.7  CL 102  CO2 29  GLUCOSE 103*  BUN 24*  CREATININE 0.98  CALCIUM 9.8   GFR: Estimated Creatinine Clearance: 52.9 mL/min (by C-G formula based on SCr of 0.98 mg/dL). Liver Function Tests: Recent Labs  Lab 01/03/18 2101  AST 18  ALT <5  ALKPHOS 57  BILITOT 0.8  PROT 6.9  ALBUMIN 3.9   No results for input(s): LIPASE, AMYLASE in the last 168 hours. No results for input(s): AMMONIA in the last 168 hours. Coagulation Profile: No results for input(s): INR, PROTIME in the last 168 hours. Cardiac Enzymes: No results for input(s): CKTOTAL, CKMB, CKMBINDEX, TROPONINI in the last 168 hours. BNP (last 3 results) No results for input(s): PROBNP in the last 8760  hours. HbA1C: No results for input(s): HGBA1C in the last 72 hours. CBG: No results for input(s): GLUCAP in the last 168 hours. Lipid Profile: No results for input(s): CHOL, HDL, LDLCALC, TRIG, CHOLHDL, LDLDIRECT in the last 72 hours. Thyroid Function Tests: No results for input(s): TSH, T4TOTAL, FREET4, T3FREE, THYROIDAB in the last 72 hours. Anemia Panel: No results for input(s): VITAMINB12, FOLATE, FERRITIN, TIBC, IRON, RETICCTPCT in the last 72 hours. Urine analysis:    Component Value Date/Time   COLORURINE YELLOW 01/04/2018 0102   APPEARANCEUR CLEAR 01/04/2018 0102   APPEARANCEUR Cloudy (A) 08/21/2013 1345   LABSPEC 1.021 01/04/2018 0102   PHURINE 6.0 01/04/2018 0102   GLUCOSEU NEGATIVE 01/04/2018 0102   HGBUR NEGATIVE 01/04/2018 0102   HGBUR 1+ 03/21/2009 1105   BILIRUBINUR NEGATIVE 01/04/2018 0102   BILIRUBINUR small (A) 12/08/2016 1508   BILIRUBINUR Large 10/09/2014 1549   BILIRUBINUR Negative 08/21/2013 1345   KETONESUR 5 (A) 01/04/2018 0102   PROTEINUR NEGATIVE 01/04/2018 0102   UROBILINOGEN 0.2 12/08/2016 1508   UROBILINOGEN 1.0 11/04/2012 1458   NITRITE NEGATIVE 01/04/2018 0102   LEUKOCYTESUR NEGATIVE 01/04/2018 0102   LEUKOCYTESUR 1+ (A) 08/21/2013 1345   Sepsis Labs: @LABRCNTIP (procalcitonin:4,lacticidven:4) )No results found for this or any previous visit (from the past 240 hour(s)).   Radiological Exams on Admission: Dg Chest 2 View  Result Date: 01/03/2018 CLINICAL DATA:  Acute onset of fever. Concern for pneumonia. EXAM: CHEST - 2 VIEW COMPARISON:  Chest radiograph performed 11/21/2017 FINDINGS: The lungs are well-aerated. Mild right basilar airspace opacity raises concern for pneumonia, given clinical concern. There is no evidence of pleural effusion or pneumothorax. The heart is normal in size; the mediastinal contour is within normal limits. No acute osseous abnormalities are seen. IMPRESSION: Mild right basilar airspace opacity raises concern  for  pneumonia, given clinical concern. Electronically Signed   By: Roanna RaiderJeffery  Chang M.D.   On: 01/03/2018 23:20      Assessment/Plan Principal Problem:   HCAP (healthcare-associated pneumonia) Active Problems:   Essential hypertension   Parkinson's plus syndrome (HCC)   Type 2 diabetes mellitus with autonomic neuropathy (HCC)    1. Healthcare associated pneumonia likely could be from aspiration.  I had extensive discussion with patient's wife and patient's daughter at this time they had requested patient to be continued on regular diet with known risk of aspiration but to be placed on antibiotics and hydration.  Follow blood cultures and sputum cultures when available. 2. Parkinson's disease on Sinemet. 3. Hypertension on benazepril which was changed to night dose per cardiology due to possible orthostatic changes. 4. B12 deficiency on B12 supplement. 5. Diabetes mellitus type 2 we will keep patient on sliding scale coverage. 6. BPH on Flomax. 7. Hyperlipidemia on statins.   DVT prophylaxis: Lovenox. Code Status: DNR. Family Communication: Patient's wife and daughter. Disposition Plan: Back to home. Consults called: None. Admission status: Observation.   Eduard ClosArshad N Tomara Youngberg MD [facility] Pager [phone].  If 7PM-7AM, please contact night-coverage [email or web address] Password [email or web address]  01/04/2018, 3:22 AM

## 2018-01-04 NOTE — ED Notes (Signed)
Patient moved into hospital bed. Bed in lowest position and locked. Hearing aids are in pink denture container at bedside.

## 2018-01-04 NOTE — Progress Notes (Signed)
Patient and wife given discharge instructions, and verbalized an understanding of all discharge instructions.  Patient and wife agrees with discharge plan, and is being discharged in stable medical condition.  Patient given transportation via wheelchair.

## 2018-01-04 NOTE — ED Notes (Signed)
ED TO INPATIENT HANDOFF REPORT  Name/Age/Gender Nicholas Barnett 84 y.o. male  Code Status    Code Status Orders  (From admission, onward)         Start     Ordered   01/04/18 0321  Do not attempt resuscitation (DNR)  Continuous    Question Answer Comment  In the event of cardiac or respiratory ARREST Do not call a "code blue"   In the event of cardiac or respiratory ARREST Do not perform Intubation, CPR, defibrillation or ACLS   In the event of cardiac or respiratory ARREST Use medication by any route, position, wound care, and other measures to relive pain and suffering. May use oxygen, suction and manual treatment of airway obstruction as needed for comfort.      01/04/18 0321        Code Status History    Date Active Date Inactive Code Status Order ID Comments User Context   11/22/2017 0011 11/24/2017 1722 DNR [id number]  Nicholas Patience, MD Inpatient   11/21/2017 2108 11/22/2017 0011 DNR [id number]  Nicholas Patience, MD ED   01/22/2017 2303 01/27/2017 1940 DNR [id number]  Nicholas Costa, MD ED   07/28/2014 1402 07/30/2014 1912 Full Code [id number]  Nicholas Corning, MD Inpatient    Advance Directive Documentation     Most Recent Value  Type of Advance Directive  Healthcare Power of [place]  Pre-existing out of facility DNR order (yellow form or pink MOST form)  -  "MOST" Form in Place?  -      Home/SNF/Other Home  Chief Complaint Referred From Urgent Care/Fever and Chills  Level of Care/Admitting Diagnosis ED Disposition    ED Disposition Condition [place]: [place] [100102]  Level of Care: Telemetry [5]  Admit to tele based on following criteria: Monitor for Ischemic changes  Diagnosis: HCAP (healthcare-associated pneumonia) [701779]  Admitting Physician: Nicholas Barnett [phone]  Attending Physician: Nicholas Barnett Lei.Right  PT Class (Do Not Modify): Observation [104]  PT Acc Code (Do Not Modify):  Observation [10022]       Medical History Past Medical History:  Diagnosis Date  . Abnormality of gait   . Arthritis   . Bifascicular block   . Cyanocobalamin deficiency   . Depression   . Diverticulitis   . Dizzy   . Dysphagia, idiopathic    History of aspiration  . Gait disturbance   . Hearing loss of both ears   . Hyperlipidemia   . Hypertension   . Lumbar back pain   . Memory loss   . Neck pain   . Osteoarthritis   . Osteoporosis   . Other and unspecified hyperlipidemia   . Parkinsonism ([place])   . Recurrent falls    Using a cane and walker  . Restless leg   . Restless legs syndrome (RLS)   . Right knee pain   . Shoulder pain   . Thoracic back pain   . Thyroid disease   . Type 2 diabetes mellitus with autonomic neuropathy ([place])   . Unspecified essential hypertension   . Unspecified hypothyroidism   . Vitamin D deficiency     Allergies Allergies  Allergen Reactions  . Caffeine Swelling and Other (See Comments)    Reaction:  Joint swelling    IV Location/Drains/Wounds Patient Lines/Drains/Airways Status   Active Line/Drains/Airways    Name:   Placement date:   Placement time:   Site:  Days:   Peripheral IV 01/03/18 Right Wrist   01/03/18    2119    Wrist   1   Peripheral IV 01/03/18 Right;Upper Forearm   01/03/18    2304    Forearm   1   External Urinary Catheter   01/04/18    0723    -   less than 1          Labs/Imaging Results for orders placed or performed during the hospital encounter of 01/03/18 (from the past 48 hour(s))  Comprehensive metabolic panel     Status: Abnormal   Collection Time: 01/03/18  9:01 PM  Result Value Ref Range   Sodium 140 135 - 145 mmol/L   Potassium 3.7 3.5 - 5.1 mmol/L   Chloride 102 98 - 111 mmol/L   CO2 29 22 - 32 mmol/L   Glucose, Bld 103 (H) 70 - 99 mg/dL   BUN 24 (H) 8 - 23 mg/dL   Creatinine, Ser 0.98 0.61 - 1.24 mg/dL   Calcium 9.8 8.9 - 10.3 mg/dL   Total Protein 6.9 6.5 - 8.1 g/dL   Albumin 3.9 3.5 -  5.0 g/dL   AST 18 15 - 41 U/L   ALT <5 0 - 44 U/L   Alkaline Phosphatase 57 38 - 126 U/L   Total Bilirubin 0.8 0.3 - 1.2 mg/dL   GFR calc non Af Amer >60 >60 mL/min   GFR calc Af Amer >60 >60 mL/min    Comment: (NOTE) The eGFR has been calculated using the CKD EPI equation. This calculation has not been validated in all clinical situations. eGFR's persistently <60 mL/min signify possible Chronic Kidney Disease.    Anion gap 9 5 - 15    Comment: Performed at [facility], [place] [address], [place], [place] [address]  CBC WITH DIFFERENTIAL     Status: Abnormal   Collection Time: 01/03/18  9:01 PM  Result Value Ref Range   WBC 13.1 (H) 4.0 - 10.5 K/uL   RBC 4.27 4.22 - 5.81 MIL/uL   Hemoglobin 14.0 13.0 - 17.0 g/dL   HCT 42.5 39.0 - 52.0 %   MCV 99.5 80.0 - 100.0 fL   MCH 32.8 26.0 - 34.0 pg   MCHC 32.9 30.0 - 36.0 g/dL   RDW 13.6 11.5 - 15.5 %   Platelets 250 150 - 400 K/uL   nRBC 0.0 0.0 - 0.2 %   Neutrophils Relative % 85 %   Neutro Abs 11.1 (H) 1.7 - 7.7 K/uL   Lymphocytes Relative 9 %   Lymphs Abs 1.1 0.7 - 4.0 K/uL   Monocytes Relative 5 %   Monocytes Absolute 0.6 0.1 - 1.0 K/uL   Eosinophils Relative 1 %   Eosinophils Absolute 0.1 0.0 - 0.5 K/uL   Basophils Relative 0 %   Basophils Absolute 0.1 0.0 - 0.1 K/uL   Immature Granulocytes 0 %   Abs Immature Granulocytes 0.05 0.00 - 0.07 K/uL    Comment: Performed at [facility], [place] [address], [place], [place] [address]  I-Stat CG4 Lactic Acid, ED     Status: Abnormal   Collection Time: 01/03/18  9:37 PM  Result Value Ref Range   Lactic Acid, Venous 3.02 (HH) 0.5 - 1.9 mmol/L   Comment NOTIFIED PHYSICIAN   I-Stat CG4 Lactic Acid, ED     Status: None   Collection Time: 01/03/18 11:45 PM  Result Value Ref Range   Lactic Acid, Venous 0.62 0.5 -  1.9 mmol/L  Urinalysis, Routine w reflex microscopic     Status: Abnormal   Collection Time: 01/04/18  1:02 AM  Result Value Ref Range    Color, Urine YELLOW YELLOW   APPearance CLEAR CLEAR   Specific Gravity, Urine 1.021 1.005 - 1.030   pH 6.0 5.0 - 8.0   Glucose, UA NEGATIVE NEGATIVE mg/dL   Hgb urine dipstick NEGATIVE NEGATIVE   Bilirubin Urine NEGATIVE NEGATIVE   Ketones, ur 5 (A) NEGATIVE mg/dL   Protein, ur NEGATIVE NEGATIVE mg/dL   Nitrite NEGATIVE NEGATIVE   Leukocytes, UA NEGATIVE NEGATIVE    Comment: Performed at [place] [facility] [address] [place], [place] [address]  Strep pneumoniae urinary antigen     Status: None   Collection Time: 01/04/18  1:02 AM  Result Value Ref Range   Strep Pneumo Urinary Antigen NEGATIVE NEGATIVE    Comment:        Infection due to S. pneumoniae cannot be absolutely ruled out since the antigen present may be below the detection limit of the test. Performed at [facility], [address] [place], [place] [address]    Dg Chest 2 View  Result Date: 01/03/2018 CLINICAL DATA:  Acute onset of fever. Concern for pneumonia. EXAM: CHEST - 2 VIEW COMPARISON:  Chest radiograph performed 11/21/2017 FINDINGS: The lungs are well-aerated. Mild right basilar airspace opacity raises concern for pneumonia, given clinical concern. There is no evidence of pleural effusion or pneumothorax. The heart is normal in size; the mediastinal contour is within normal limits. No acute osseous abnormalities are seen. IMPRESSION: Mild right basilar airspace opacity raises concern for pneumonia, given clinical concern. Electronically Signed   By: Garald Balding M.D.   On: 01/03/2018 23:20   EKG Interpretation  Date/Time:  Monday January 03 2018 21:51:21 EST Ventricular Rate:  75 PR Interval:    QRS Duration: 154 QT Interval:  381 QTC Calculation: 426 R Axis:   -76 Text Interpretation:  Sinus rhythm RBBB and LAFB Probable left ventricular hypertrophy Confirmed by Lacretia Leigh ([id number]) on 01/03/2018 10:15:29 PM Also confirmed by Lacretia Leigh ([id number]), editor Hattie Perch  ([id number])  on 01/04/2018 7:03:44 AM   Pending Labs Unresulted Labs (From admission, onward)    Start     Ordered   01/11/18 0500  Creatinine, serum  (enoxaparin (LOVENOX)    CrCl >/= 30 ml/min)  Weekly,   R    Comments:  while on enoxaparin therapy    01/04/18 0321   01/04/18 7169  Basic metabolic panel  Once,   STAT     01/04/18 0745   01/04/18 0745  CBC  Once,   STAT     01/04/18 0745   01/04/18 0320  Culture, sputum-assessment  Once,   R     01/04/18 0321   01/04/18 0320  Gram stain  Once,   R     01/04/18 0321   01/04/18 0320  Legionella Pneumophila Serogp 1 Ur Ag  Once,   R     01/04/18 0321   01/03/18 2051  Blood Culture (routine x 2)  BLOOD CULTURE X 2,   STAT     01/03/18 2050          Vitals/Pain Today's Vitals   01/04/18 0512 01/04/18 0526 01/04/18 0700 01/04/18 0730  BP: (!) 169/82  (!) 187/90 (!) 165/86  Pulse: 60  62 (!) 56  Resp: (!) '21  15 16  ' Temp:      TempSrc:  SpO2: 97%  99% 100%  Weight:      Height:      PainSc:  Asleep      Isolation Precautions No active isolations  Medications Medications  aspirin EC tablet 81 mg (has no administration in time range)  benazepril (LOTENSIN) tablet 5 mg (has no administration in time range)  pravastatin (PRAVACHOL) tablet 20 mg (has no administration in time range)  mirtazapine (REMERON) tablet 30 mg (has no administration in time range)  polycarbophil (FIBERCON) tablet 625 mg (has no administration in time range)  pantoprazole (PROTONIX) EC tablet 40 mg (has no administration in time range)  tamsulosin (FLOMAX) capsule 0.4 mg (has no administration in time range)  carbidopa-levodopa (SINEMET IR) 25-100 MG per tablet immediate release 3 tablet (has no administration in time range)  gabapentin (NEURONTIN) capsule 300 mg (has no administration in time range)  calcium-vitamin D (OSCAL WITH D) 500-200 MG-UNIT per tablet 1 tablet (has no administration in time range)  pyridOXINE (VITAMIN B-6) tablet 100 mg  (has no administration in time range)  acetaminophen (TYLENOL) tablet 650 mg (has no administration in time range)    Or  acetaminophen (TYLENOL) suppository 650 mg (has no administration in time range)  ondansetron (ZOFRAN) tablet 4 mg (has no administration in time range)    Or  ondansetron (ZOFRAN) injection 4 mg (has no administration in time range)  ceFEPIme (MAXIPIME) 1 g in sodium chloride 0.9 % 100 mL IVPB (0 g Intravenous Stopped 01/04/18 0641)  insulin aspart (novoLOG) injection 0-9 Units (has no administration in time range)  enoxaparin (LOVENOX) injection 40 mg (has no administration in time range)  0.9 %  sodium chloride infusion ( Intravenous New Bag/Given 01/04/18 0402)  vancomycin (VANCOCIN) 1,250 mg in sodium chloride 0.9 % 250 mL IVPB (has no administration in time range)  sodium chloride 0.9 % bolus 1,000 mL (0 mLs Intravenous Stopped 01/04/18 0057)    And  sodium chloride 0.9 % bolus 1,000 mL (0 mLs Intravenous Stopped 01/04/18 0057)    And  sodium chloride 0.9 % bolus 250 mL (0 mLs Intravenous Stopped 01/04/18 0141)  vancomycin (VANCOCIN) 1,500 mg in sodium chloride 0.9 % 500 mL IVPB (0 mg Intravenous Stopped 01/04/18 0605)    Mobility non-ambulatory

## 2018-01-05 DIAGNOSIS — D519 Vitamin B12 deficiency anemia, unspecified: Secondary | ICD-10-CM | POA: Diagnosis not present

## 2018-01-05 DIAGNOSIS — M5033 Other cervical disc degeneration, cervicothoracic region: Secondary | ICD-10-CM | POA: Diagnosis not present

## 2018-01-05 DIAGNOSIS — Z7982 Long term (current) use of aspirin: Secondary | ICD-10-CM | POA: Diagnosis not present

## 2018-01-05 DIAGNOSIS — N182 Chronic kidney disease, stage 2 (mild): Secondary | ICD-10-CM | POA: Diagnosis not present

## 2018-01-05 DIAGNOSIS — E1122 Type 2 diabetes mellitus with diabetic chronic kidney disease: Secondary | ICD-10-CM | POA: Diagnosis not present

## 2018-01-05 DIAGNOSIS — M1711 Unilateral primary osteoarthritis, right knee: Secondary | ICD-10-CM | POA: Diagnosis not present

## 2018-01-05 DIAGNOSIS — M19012 Primary osteoarthritis, left shoulder: Secondary | ICD-10-CM | POA: Diagnosis not present

## 2018-01-05 DIAGNOSIS — Z9181 History of falling: Secondary | ICD-10-CM | POA: Diagnosis not present

## 2018-01-05 DIAGNOSIS — E559 Vitamin D deficiency, unspecified: Secondary | ICD-10-CM | POA: Diagnosis not present

## 2018-01-05 DIAGNOSIS — R1319 Other dysphagia: Secondary | ICD-10-CM | POA: Diagnosis not present

## 2018-01-05 DIAGNOSIS — M810 Age-related osteoporosis without current pathological fracture: Secondary | ICD-10-CM | POA: Diagnosis not present

## 2018-01-05 DIAGNOSIS — G214 Vascular parkinsonism: Secondary | ICD-10-CM | POA: Diagnosis not present

## 2018-01-05 DIAGNOSIS — I129 Hypertensive chronic kidney disease with stage 1 through stage 4 chronic kidney disease, or unspecified chronic kidney disease: Secondary | ICD-10-CM | POA: Diagnosis not present

## 2018-01-05 DIAGNOSIS — I452 Bifascicular block: Secondary | ICD-10-CM | POA: Diagnosis not present

## 2018-01-05 DIAGNOSIS — E1143 Type 2 diabetes mellitus with diabetic autonomic (poly)neuropathy: Secondary | ICD-10-CM | POA: Diagnosis not present

## 2018-01-05 DIAGNOSIS — Z87440 Personal history of urinary (tract) infections: Secondary | ICD-10-CM | POA: Diagnosis not present

## 2018-01-05 DIAGNOSIS — H9193 Unspecified hearing loss, bilateral: Secondary | ICD-10-CM | POA: Diagnosis not present

## 2018-01-05 DIAGNOSIS — Z7984 Long term (current) use of oral hypoglycemic drugs: Secondary | ICD-10-CM | POA: Diagnosis not present

## 2018-01-05 DIAGNOSIS — Z87891 Personal history of nicotine dependence: Secondary | ICD-10-CM | POA: Diagnosis not present

## 2018-01-05 DIAGNOSIS — G2581 Restless legs syndrome: Secondary | ICD-10-CM | POA: Diagnosis not present

## 2018-01-05 DIAGNOSIS — E43 Unspecified severe protein-calorie malnutrition: Secondary | ICD-10-CM | POA: Diagnosis not present

## 2018-01-05 DIAGNOSIS — M19011 Primary osteoarthritis, right shoulder: Secondary | ICD-10-CM | POA: Diagnosis not present

## 2018-01-06 DIAGNOSIS — M810 Age-related osteoporosis without current pathological fracture: Secondary | ICD-10-CM | POA: Diagnosis not present

## 2018-01-06 DIAGNOSIS — I452 Bifascicular block: Secondary | ICD-10-CM | POA: Diagnosis not present

## 2018-01-06 DIAGNOSIS — M19012 Primary osteoarthritis, left shoulder: Secondary | ICD-10-CM | POA: Diagnosis not present

## 2018-01-06 DIAGNOSIS — E559 Vitamin D deficiency, unspecified: Secondary | ICD-10-CM | POA: Diagnosis not present

## 2018-01-06 DIAGNOSIS — Z87891 Personal history of nicotine dependence: Secondary | ICD-10-CM | POA: Diagnosis not present

## 2018-01-06 DIAGNOSIS — R1319 Other dysphagia: Secondary | ICD-10-CM | POA: Diagnosis not present

## 2018-01-06 DIAGNOSIS — E1122 Type 2 diabetes mellitus with diabetic chronic kidney disease: Secondary | ICD-10-CM | POA: Diagnosis not present

## 2018-01-06 DIAGNOSIS — E1143 Type 2 diabetes mellitus with diabetic autonomic (poly)neuropathy: Secondary | ICD-10-CM | POA: Diagnosis not present

## 2018-01-06 DIAGNOSIS — H9193 Unspecified hearing loss, bilateral: Secondary | ICD-10-CM | POA: Diagnosis not present

## 2018-01-06 DIAGNOSIS — Z7982 Long term (current) use of aspirin: Secondary | ICD-10-CM | POA: Diagnosis not present

## 2018-01-06 DIAGNOSIS — M19011 Primary osteoarthritis, right shoulder: Secondary | ICD-10-CM | POA: Diagnosis not present

## 2018-01-06 DIAGNOSIS — M5033 Other cervical disc degeneration, cervicothoracic region: Secondary | ICD-10-CM | POA: Diagnosis not present

## 2018-01-06 DIAGNOSIS — D519 Vitamin B12 deficiency anemia, unspecified: Secondary | ICD-10-CM | POA: Diagnosis not present

## 2018-01-06 DIAGNOSIS — Z7984 Long term (current) use of oral hypoglycemic drugs: Secondary | ICD-10-CM | POA: Diagnosis not present

## 2018-01-06 DIAGNOSIS — Z9181 History of falling: Secondary | ICD-10-CM | POA: Diagnosis not present

## 2018-01-06 DIAGNOSIS — Z87440 Personal history of urinary (tract) infections: Secondary | ICD-10-CM | POA: Diagnosis not present

## 2018-01-06 DIAGNOSIS — G214 Vascular parkinsonism: Secondary | ICD-10-CM | POA: Diagnosis not present

## 2018-01-06 DIAGNOSIS — M1711 Unilateral primary osteoarthritis, right knee: Secondary | ICD-10-CM | POA: Diagnosis not present

## 2018-01-06 DIAGNOSIS — G2581 Restless legs syndrome: Secondary | ICD-10-CM | POA: Diagnosis not present

## 2018-01-06 DIAGNOSIS — N182 Chronic kidney disease, stage 2 (mild): Secondary | ICD-10-CM | POA: Diagnosis not present

## 2018-01-06 DIAGNOSIS — I129 Hypertensive chronic kidney disease with stage 1 through stage 4 chronic kidney disease, or unspecified chronic kidney disease: Secondary | ICD-10-CM | POA: Diagnosis not present

## 2018-01-06 DIAGNOSIS — E43 Unspecified severe protein-calorie malnutrition: Secondary | ICD-10-CM | POA: Diagnosis not present

## 2018-01-06 LAB — LEGIONELLA PNEUMOPHILA SEROGP 1 UR AG: L. PNEUMOPHILA SEROGP 1 UR AG: NEGATIVE

## 2018-01-08 LAB — CULTURE, BLOOD (ROUTINE X 2)
CULTURE: NO GROWTH
SPECIAL REQUESTS: ADEQUATE

## 2018-01-09 DIAGNOSIS — J189 Pneumonia, unspecified organism: Secondary | ICD-10-CM | POA: Diagnosis not present

## 2018-01-09 DIAGNOSIS — N182 Chronic kidney disease, stage 2 (mild): Secondary | ICD-10-CM

## 2018-01-09 DIAGNOSIS — G214 Vascular parkinsonism: Secondary | ICD-10-CM | POA: Diagnosis not present

## 2018-01-09 DIAGNOSIS — E1122 Type 2 diabetes mellitus with diabetic chronic kidney disease: Secondary | ICD-10-CM

## 2018-01-09 DIAGNOSIS — G232 Striatonigral degeneration: Secondary | ICD-10-CM | POA: Diagnosis not present

## 2018-01-09 DIAGNOSIS — I129 Hypertensive chronic kidney disease with stage 1 through stage 4 chronic kidney disease, or unspecified chronic kidney disease: Secondary | ICD-10-CM

## 2018-01-09 DIAGNOSIS — M5033 Other cervical disc degeneration, cervicothoracic region: Secondary | ICD-10-CM

## 2018-01-09 DIAGNOSIS — R1319 Other dysphagia: Secondary | ICD-10-CM | POA: Diagnosis not present

## 2018-01-11 LAB — CULTURE, BLOOD (ROUTINE X 2): CULTURE: NO GROWTH

## 2018-01-13 DIAGNOSIS — M5033 Other cervical disc degeneration, cervicothoracic region: Secondary | ICD-10-CM | POA: Diagnosis not present

## 2018-01-13 DIAGNOSIS — Z87440 Personal history of urinary (tract) infections: Secondary | ICD-10-CM | POA: Diagnosis not present

## 2018-01-13 DIAGNOSIS — H9193 Unspecified hearing loss, bilateral: Secondary | ICD-10-CM | POA: Diagnosis not present

## 2018-01-13 DIAGNOSIS — E43 Unspecified severe protein-calorie malnutrition: Secondary | ICD-10-CM | POA: Diagnosis not present

## 2018-01-13 DIAGNOSIS — R1319 Other dysphagia: Secondary | ICD-10-CM | POA: Diagnosis not present

## 2018-01-13 DIAGNOSIS — M19012 Primary osteoarthritis, left shoulder: Secondary | ICD-10-CM | POA: Diagnosis not present

## 2018-01-13 DIAGNOSIS — M19011 Primary osteoarthritis, right shoulder: Secondary | ICD-10-CM | POA: Diagnosis not present

## 2018-01-13 DIAGNOSIS — Z7982 Long term (current) use of aspirin: Secondary | ICD-10-CM | POA: Diagnosis not present

## 2018-01-13 DIAGNOSIS — M1711 Unilateral primary osteoarthritis, right knee: Secondary | ICD-10-CM | POA: Diagnosis not present

## 2018-01-13 DIAGNOSIS — N182 Chronic kidney disease, stage 2 (mild): Secondary | ICD-10-CM | POA: Diagnosis not present

## 2018-01-13 DIAGNOSIS — E1122 Type 2 diabetes mellitus with diabetic chronic kidney disease: Secondary | ICD-10-CM | POA: Diagnosis not present

## 2018-01-13 DIAGNOSIS — Z9181 History of falling: Secondary | ICD-10-CM | POA: Diagnosis not present

## 2018-01-13 DIAGNOSIS — I129 Hypertensive chronic kidney disease with stage 1 through stage 4 chronic kidney disease, or unspecified chronic kidney disease: Secondary | ICD-10-CM | POA: Diagnosis not present

## 2018-01-13 DIAGNOSIS — G2581 Restless legs syndrome: Secondary | ICD-10-CM | POA: Diagnosis not present

## 2018-01-13 DIAGNOSIS — Z7984 Long term (current) use of oral hypoglycemic drugs: Secondary | ICD-10-CM | POA: Diagnosis not present

## 2018-01-13 DIAGNOSIS — Z87891 Personal history of nicotine dependence: Secondary | ICD-10-CM | POA: Diagnosis not present

## 2018-01-13 DIAGNOSIS — E559 Vitamin D deficiency, unspecified: Secondary | ICD-10-CM | POA: Diagnosis not present

## 2018-01-13 DIAGNOSIS — D519 Vitamin B12 deficiency anemia, unspecified: Secondary | ICD-10-CM | POA: Diagnosis not present

## 2018-01-13 DIAGNOSIS — I452 Bifascicular block: Secondary | ICD-10-CM | POA: Diagnosis not present

## 2018-01-13 DIAGNOSIS — M810 Age-related osteoporosis without current pathological fracture: Secondary | ICD-10-CM | POA: Diagnosis not present

## 2018-01-13 DIAGNOSIS — E1143 Type 2 diabetes mellitus with diabetic autonomic (poly)neuropathy: Secondary | ICD-10-CM | POA: Diagnosis not present

## 2018-01-13 DIAGNOSIS — G214 Vascular parkinsonism: Secondary | ICD-10-CM | POA: Diagnosis not present

## 2018-01-14 DIAGNOSIS — M19011 Primary osteoarthritis, right shoulder: Secondary | ICD-10-CM | POA: Diagnosis not present

## 2018-01-14 DIAGNOSIS — E1122 Type 2 diabetes mellitus with diabetic chronic kidney disease: Secondary | ICD-10-CM | POA: Diagnosis not present

## 2018-01-14 DIAGNOSIS — G214 Vascular parkinsonism: Secondary | ICD-10-CM | POA: Diagnosis not present

## 2018-01-14 DIAGNOSIS — N182 Chronic kidney disease, stage 2 (mild): Secondary | ICD-10-CM | POA: Diagnosis not present

## 2018-01-14 DIAGNOSIS — R1319 Other dysphagia: Secondary | ICD-10-CM | POA: Diagnosis not present

## 2018-01-14 DIAGNOSIS — E559 Vitamin D deficiency, unspecified: Secondary | ICD-10-CM | POA: Diagnosis not present

## 2018-01-14 DIAGNOSIS — M19012 Primary osteoarthritis, left shoulder: Secondary | ICD-10-CM | POA: Diagnosis not present

## 2018-01-14 DIAGNOSIS — D519 Vitamin B12 deficiency anemia, unspecified: Secondary | ICD-10-CM | POA: Diagnosis not present

## 2018-01-14 DIAGNOSIS — Z87891 Personal history of nicotine dependence: Secondary | ICD-10-CM | POA: Diagnosis not present

## 2018-01-14 DIAGNOSIS — E43 Unspecified severe protein-calorie malnutrition: Secondary | ICD-10-CM | POA: Diagnosis not present

## 2018-01-14 DIAGNOSIS — H9193 Unspecified hearing loss, bilateral: Secondary | ICD-10-CM | POA: Diagnosis not present

## 2018-01-14 DIAGNOSIS — M1711 Unilateral primary osteoarthritis, right knee: Secondary | ICD-10-CM | POA: Diagnosis not present

## 2018-01-14 DIAGNOSIS — Z7982 Long term (current) use of aspirin: Secondary | ICD-10-CM | POA: Diagnosis not present

## 2018-01-14 DIAGNOSIS — M810 Age-related osteoporosis without current pathological fracture: Secondary | ICD-10-CM | POA: Diagnosis not present

## 2018-01-14 DIAGNOSIS — E1143 Type 2 diabetes mellitus with diabetic autonomic (poly)neuropathy: Secondary | ICD-10-CM | POA: Diagnosis not present

## 2018-01-14 DIAGNOSIS — I129 Hypertensive chronic kidney disease with stage 1 through stage 4 chronic kidney disease, or unspecified chronic kidney disease: Secondary | ICD-10-CM | POA: Diagnosis not present

## 2018-01-14 DIAGNOSIS — G2581 Restless legs syndrome: Secondary | ICD-10-CM | POA: Diagnosis not present

## 2018-01-14 DIAGNOSIS — Z7984 Long term (current) use of oral hypoglycemic drugs: Secondary | ICD-10-CM | POA: Diagnosis not present

## 2018-01-14 DIAGNOSIS — I452 Bifascicular block: Secondary | ICD-10-CM | POA: Diagnosis not present

## 2018-01-14 DIAGNOSIS — M5033 Other cervical disc degeneration, cervicothoracic region: Secondary | ICD-10-CM | POA: Diagnosis not present

## 2018-01-14 DIAGNOSIS — Z9181 History of falling: Secondary | ICD-10-CM | POA: Diagnosis not present

## 2018-01-14 DIAGNOSIS — Z87440 Personal history of urinary (tract) infections: Secondary | ICD-10-CM | POA: Diagnosis not present

## 2018-01-17 DIAGNOSIS — H9193 Unspecified hearing loss, bilateral: Secondary | ICD-10-CM | POA: Diagnosis not present

## 2018-01-17 DIAGNOSIS — Z9181 History of falling: Secondary | ICD-10-CM | POA: Diagnosis not present

## 2018-01-17 DIAGNOSIS — R1319 Other dysphagia: Secondary | ICD-10-CM | POA: Diagnosis not present

## 2018-01-17 DIAGNOSIS — E1122 Type 2 diabetes mellitus with diabetic chronic kidney disease: Secondary | ICD-10-CM | POA: Diagnosis not present

## 2018-01-17 DIAGNOSIS — E43 Unspecified severe protein-calorie malnutrition: Secondary | ICD-10-CM | POA: Diagnosis not present

## 2018-01-17 DIAGNOSIS — Z87440 Personal history of urinary (tract) infections: Secondary | ICD-10-CM | POA: Diagnosis not present

## 2018-01-17 DIAGNOSIS — M810 Age-related osteoporosis without current pathological fracture: Secondary | ICD-10-CM | POA: Diagnosis not present

## 2018-01-17 DIAGNOSIS — Z87891 Personal history of nicotine dependence: Secondary | ICD-10-CM | POA: Diagnosis not present

## 2018-01-17 DIAGNOSIS — M19012 Primary osteoarthritis, left shoulder: Secondary | ICD-10-CM | POA: Diagnosis not present

## 2018-01-17 DIAGNOSIS — M5033 Other cervical disc degeneration, cervicothoracic region: Secondary | ICD-10-CM | POA: Diagnosis not present

## 2018-01-17 DIAGNOSIS — D519 Vitamin B12 deficiency anemia, unspecified: Secondary | ICD-10-CM | POA: Diagnosis not present

## 2018-01-17 DIAGNOSIS — I129 Hypertensive chronic kidney disease with stage 1 through stage 4 chronic kidney disease, or unspecified chronic kidney disease: Secondary | ICD-10-CM | POA: Diagnosis not present

## 2018-01-17 DIAGNOSIS — Z7982 Long term (current) use of aspirin: Secondary | ICD-10-CM | POA: Diagnosis not present

## 2018-01-17 DIAGNOSIS — G214 Vascular parkinsonism: Secondary | ICD-10-CM | POA: Diagnosis not present

## 2018-01-17 DIAGNOSIS — I452 Bifascicular block: Secondary | ICD-10-CM | POA: Diagnosis not present

## 2018-01-17 DIAGNOSIS — M19011 Primary osteoarthritis, right shoulder: Secondary | ICD-10-CM | POA: Diagnosis not present

## 2018-01-17 DIAGNOSIS — E1143 Type 2 diabetes mellitus with diabetic autonomic (poly)neuropathy: Secondary | ICD-10-CM | POA: Diagnosis not present

## 2018-01-17 DIAGNOSIS — G2581 Restless legs syndrome: Secondary | ICD-10-CM | POA: Diagnosis not present

## 2018-01-17 DIAGNOSIS — E559 Vitamin D deficiency, unspecified: Secondary | ICD-10-CM | POA: Diagnosis not present

## 2018-01-17 DIAGNOSIS — Z7984 Long term (current) use of oral hypoglycemic drugs: Secondary | ICD-10-CM | POA: Diagnosis not present

## 2018-01-17 DIAGNOSIS — N182 Chronic kidney disease, stage 2 (mild): Secondary | ICD-10-CM | POA: Diagnosis not present

## 2018-01-17 DIAGNOSIS — M1711 Unilateral primary osteoarthritis, right knee: Secondary | ICD-10-CM | POA: Diagnosis not present

## 2018-01-18 ENCOUNTER — Telehealth: Payer: Self-pay | Admitting: Neurology

## 2018-01-18 ENCOUNTER — Telehealth: Payer: Self-pay | Admitting: *Deleted

## 2018-01-18 DIAGNOSIS — Z87891 Personal history of nicotine dependence: Secondary | ICD-10-CM | POA: Diagnosis not present

## 2018-01-18 DIAGNOSIS — M1711 Unilateral primary osteoarthritis, right knee: Secondary | ICD-10-CM | POA: Diagnosis not present

## 2018-01-18 DIAGNOSIS — E1143 Type 2 diabetes mellitus with diabetic autonomic (poly)neuropathy: Secondary | ICD-10-CM | POA: Diagnosis not present

## 2018-01-18 DIAGNOSIS — H9193 Unspecified hearing loss, bilateral: Secondary | ICD-10-CM | POA: Diagnosis not present

## 2018-01-18 DIAGNOSIS — E559 Vitamin D deficiency, unspecified: Secondary | ICD-10-CM | POA: Diagnosis not present

## 2018-01-18 DIAGNOSIS — Z9181 History of falling: Secondary | ICD-10-CM | POA: Diagnosis not present

## 2018-01-18 DIAGNOSIS — M810 Age-related osteoporosis without current pathological fracture: Secondary | ICD-10-CM | POA: Diagnosis not present

## 2018-01-18 DIAGNOSIS — Z7984 Long term (current) use of oral hypoglycemic drugs: Secondary | ICD-10-CM | POA: Diagnosis not present

## 2018-01-18 DIAGNOSIS — I452 Bifascicular block: Secondary | ICD-10-CM | POA: Diagnosis not present

## 2018-01-18 DIAGNOSIS — Z87440 Personal history of urinary (tract) infections: Secondary | ICD-10-CM | POA: Diagnosis not present

## 2018-01-18 DIAGNOSIS — M19012 Primary osteoarthritis, left shoulder: Secondary | ICD-10-CM | POA: Diagnosis not present

## 2018-01-18 DIAGNOSIS — Z7982 Long term (current) use of aspirin: Secondary | ICD-10-CM | POA: Diagnosis not present

## 2018-01-18 DIAGNOSIS — M5033 Other cervical disc degeneration, cervicothoracic region: Secondary | ICD-10-CM | POA: Diagnosis not present

## 2018-01-18 DIAGNOSIS — D519 Vitamin B12 deficiency anemia, unspecified: Secondary | ICD-10-CM | POA: Diagnosis not present

## 2018-01-18 DIAGNOSIS — E1122 Type 2 diabetes mellitus with diabetic chronic kidney disease: Secondary | ICD-10-CM | POA: Diagnosis not present

## 2018-01-18 DIAGNOSIS — G214 Vascular parkinsonism: Secondary | ICD-10-CM | POA: Diagnosis not present

## 2018-01-18 DIAGNOSIS — N182 Chronic kidney disease, stage 2 (mild): Secondary | ICD-10-CM | POA: Diagnosis not present

## 2018-01-18 DIAGNOSIS — G2581 Restless legs syndrome: Secondary | ICD-10-CM | POA: Diagnosis not present

## 2018-01-18 DIAGNOSIS — E43 Unspecified severe protein-calorie malnutrition: Secondary | ICD-10-CM | POA: Diagnosis not present

## 2018-01-18 DIAGNOSIS — M19011 Primary osteoarthritis, right shoulder: Secondary | ICD-10-CM | POA: Diagnosis not present

## 2018-01-18 DIAGNOSIS — R1319 Other dysphagia: Secondary | ICD-10-CM | POA: Diagnosis not present

## 2018-01-18 DIAGNOSIS — I129 Hypertensive chronic kidney disease with stage 1 through stage 4 chronic kidney disease, or unspecified chronic kidney disease: Secondary | ICD-10-CM | POA: Diagnosis not present

## 2018-01-18 NOTE — Telephone Encounter (Signed)
Nicholas Barnett with [facility] called requesting verbal orders for speech therapy. Verbal orders given.

## 2018-01-21 DIAGNOSIS — I129 Hypertensive chronic kidney disease with stage 1 through stage 4 chronic kidney disease, or unspecified chronic kidney disease: Secondary | ICD-10-CM | POA: Diagnosis not present

## 2018-01-21 DIAGNOSIS — D519 Vitamin B12 deficiency anemia, unspecified: Secondary | ICD-10-CM | POA: Diagnosis not present

## 2018-01-21 DIAGNOSIS — H9193 Unspecified hearing loss, bilateral: Secondary | ICD-10-CM | POA: Diagnosis not present

## 2018-01-21 DIAGNOSIS — E1143 Type 2 diabetes mellitus with diabetic autonomic (poly)neuropathy: Secondary | ICD-10-CM | POA: Diagnosis not present

## 2018-01-21 DIAGNOSIS — M19011 Primary osteoarthritis, right shoulder: Secondary | ICD-10-CM | POA: Diagnosis not present

## 2018-01-21 DIAGNOSIS — E1122 Type 2 diabetes mellitus with diabetic chronic kidney disease: Secondary | ICD-10-CM | POA: Diagnosis not present

## 2018-01-21 DIAGNOSIS — G214 Vascular parkinsonism: Secondary | ICD-10-CM | POA: Diagnosis not present

## 2018-01-21 DIAGNOSIS — E559 Vitamin D deficiency, unspecified: Secondary | ICD-10-CM | POA: Diagnosis not present

## 2018-01-21 DIAGNOSIS — M1711 Unilateral primary osteoarthritis, right knee: Secondary | ICD-10-CM | POA: Diagnosis not present

## 2018-01-21 DIAGNOSIS — G2581 Restless legs syndrome: Secondary | ICD-10-CM | POA: Diagnosis not present

## 2018-01-21 DIAGNOSIS — N182 Chronic kidney disease, stage 2 (mild): Secondary | ICD-10-CM | POA: Diagnosis not present

## 2018-01-21 DIAGNOSIS — Z7984 Long term (current) use of oral hypoglycemic drugs: Secondary | ICD-10-CM | POA: Diagnosis not present

## 2018-01-21 DIAGNOSIS — M810 Age-related osteoporosis without current pathological fracture: Secondary | ICD-10-CM | POA: Diagnosis not present

## 2018-01-21 DIAGNOSIS — E43 Unspecified severe protein-calorie malnutrition: Secondary | ICD-10-CM | POA: Diagnosis not present

## 2018-01-21 DIAGNOSIS — I452 Bifascicular block: Secondary | ICD-10-CM | POA: Diagnosis not present

## 2018-01-21 DIAGNOSIS — Z87891 Personal history of nicotine dependence: Secondary | ICD-10-CM | POA: Diagnosis not present

## 2018-01-21 DIAGNOSIS — Z87440 Personal history of urinary (tract) infections: Secondary | ICD-10-CM | POA: Diagnosis not present

## 2018-01-21 DIAGNOSIS — Z7982 Long term (current) use of aspirin: Secondary | ICD-10-CM | POA: Diagnosis not present

## 2018-01-21 DIAGNOSIS — Z9181 History of falling: Secondary | ICD-10-CM | POA: Diagnosis not present

## 2018-01-21 DIAGNOSIS — M5033 Other cervical disc degeneration, cervicothoracic region: Secondary | ICD-10-CM | POA: Diagnosis not present

## 2018-01-21 DIAGNOSIS — R1319 Other dysphagia: Secondary | ICD-10-CM | POA: Diagnosis not present

## 2018-01-21 DIAGNOSIS — M19012 Primary osteoarthritis, left shoulder: Secondary | ICD-10-CM | POA: Diagnosis not present

## 2018-01-24 ENCOUNTER — Other Ambulatory Visit: Payer: Self-pay | Admitting: Internal Medicine

## 2018-01-24 DIAGNOSIS — G214 Vascular parkinsonism: Secondary | ICD-10-CM | POA: Diagnosis not present

## 2018-01-24 DIAGNOSIS — M19011 Primary osteoarthritis, right shoulder: Secondary | ICD-10-CM | POA: Diagnosis not present

## 2018-01-24 DIAGNOSIS — Z87440 Personal history of urinary (tract) infections: Secondary | ICD-10-CM | POA: Diagnosis not present

## 2018-01-24 DIAGNOSIS — E1122 Type 2 diabetes mellitus with diabetic chronic kidney disease: Secondary | ICD-10-CM | POA: Diagnosis not present

## 2018-01-24 DIAGNOSIS — M5033 Other cervical disc degeneration, cervicothoracic region: Secondary | ICD-10-CM | POA: Diagnosis not present

## 2018-01-24 DIAGNOSIS — G232 Striatonigral degeneration: Secondary | ICD-10-CM | POA: Diagnosis not present

## 2018-01-24 DIAGNOSIS — M19012 Primary osteoarthritis, left shoulder: Secondary | ICD-10-CM | POA: Diagnosis not present

## 2018-01-24 DIAGNOSIS — Z7982 Long term (current) use of aspirin: Secondary | ICD-10-CM | POA: Diagnosis not present

## 2018-01-24 DIAGNOSIS — H9193 Unspecified hearing loss, bilateral: Secondary | ICD-10-CM | POA: Diagnosis not present

## 2018-01-24 DIAGNOSIS — E1143 Type 2 diabetes mellitus with diabetic autonomic (poly)neuropathy: Secondary | ICD-10-CM | POA: Diagnosis not present

## 2018-01-24 DIAGNOSIS — R1312 Dysphagia, oropharyngeal phase: Secondary | ICD-10-CM | POA: Diagnosis not present

## 2018-01-24 DIAGNOSIS — N182 Chronic kidney disease, stage 2 (mild): Secondary | ICD-10-CM | POA: Diagnosis not present

## 2018-01-24 DIAGNOSIS — Z7984 Long term (current) use of oral hypoglycemic drugs: Secondary | ICD-10-CM | POA: Diagnosis not present

## 2018-01-24 DIAGNOSIS — Z87891 Personal history of nicotine dependence: Secondary | ICD-10-CM | POA: Diagnosis not present

## 2018-01-24 DIAGNOSIS — I452 Bifascicular block: Secondary | ICD-10-CM | POA: Diagnosis not present

## 2018-01-24 DIAGNOSIS — E43 Unspecified severe protein-calorie malnutrition: Secondary | ICD-10-CM | POA: Diagnosis not present

## 2018-01-24 DIAGNOSIS — G2581 Restless legs syndrome: Secondary | ICD-10-CM | POA: Diagnosis not present

## 2018-01-24 DIAGNOSIS — I129 Hypertensive chronic kidney disease with stage 1 through stage 4 chronic kidney disease, or unspecified chronic kidney disease: Secondary | ICD-10-CM | POA: Diagnosis not present

## 2018-01-24 DIAGNOSIS — M1711 Unilateral primary osteoarthritis, right knee: Secondary | ICD-10-CM | POA: Diagnosis not present

## 2018-01-24 DIAGNOSIS — Z9181 History of falling: Secondary | ICD-10-CM | POA: Diagnosis not present

## 2018-01-24 DIAGNOSIS — M810 Age-related osteoporosis without current pathological fracture: Secondary | ICD-10-CM | POA: Diagnosis not present

## 2018-01-24 DIAGNOSIS — R1319 Other dysphagia: Secondary | ICD-10-CM | POA: Diagnosis not present

## 2018-01-24 DIAGNOSIS — E559 Vitamin D deficiency, unspecified: Secondary | ICD-10-CM | POA: Diagnosis not present

## 2018-01-24 DIAGNOSIS — D519 Vitamin B12 deficiency anemia, unspecified: Secondary | ICD-10-CM | POA: Diagnosis not present

## 2018-01-26 DIAGNOSIS — Z9181 History of falling: Secondary | ICD-10-CM | POA: Diagnosis not present

## 2018-01-26 DIAGNOSIS — M1711 Unilateral primary osteoarthritis, right knee: Secondary | ICD-10-CM | POA: Diagnosis not present

## 2018-01-26 DIAGNOSIS — M5033 Other cervical disc degeneration, cervicothoracic region: Secondary | ICD-10-CM | POA: Diagnosis not present

## 2018-01-26 DIAGNOSIS — R1319 Other dysphagia: Secondary | ICD-10-CM | POA: Diagnosis not present

## 2018-01-26 DIAGNOSIS — Z87891 Personal history of nicotine dependence: Secondary | ICD-10-CM | POA: Diagnosis not present

## 2018-01-26 DIAGNOSIS — M19011 Primary osteoarthritis, right shoulder: Secondary | ICD-10-CM | POA: Diagnosis not present

## 2018-01-26 DIAGNOSIS — E559 Vitamin D deficiency, unspecified: Secondary | ICD-10-CM | POA: Diagnosis not present

## 2018-01-26 DIAGNOSIS — Z7982 Long term (current) use of aspirin: Secondary | ICD-10-CM | POA: Diagnosis not present

## 2018-01-26 DIAGNOSIS — H9193 Unspecified hearing loss, bilateral: Secondary | ICD-10-CM | POA: Diagnosis not present

## 2018-01-26 DIAGNOSIS — G2581 Restless legs syndrome: Secondary | ICD-10-CM | POA: Diagnosis not present

## 2018-01-26 DIAGNOSIS — N182 Chronic kidney disease, stage 2 (mild): Secondary | ICD-10-CM | POA: Diagnosis not present

## 2018-01-26 DIAGNOSIS — D519 Vitamin B12 deficiency anemia, unspecified: Secondary | ICD-10-CM | POA: Diagnosis not present

## 2018-01-26 DIAGNOSIS — Z87440 Personal history of urinary (tract) infections: Secondary | ICD-10-CM | POA: Diagnosis not present

## 2018-01-26 DIAGNOSIS — E1122 Type 2 diabetes mellitus with diabetic chronic kidney disease: Secondary | ICD-10-CM | POA: Diagnosis not present

## 2018-01-26 DIAGNOSIS — M19012 Primary osteoarthritis, left shoulder: Secondary | ICD-10-CM | POA: Diagnosis not present

## 2018-01-26 DIAGNOSIS — I452 Bifascicular block: Secondary | ICD-10-CM | POA: Diagnosis not present

## 2018-01-26 DIAGNOSIS — I129 Hypertensive chronic kidney disease with stage 1 through stage 4 chronic kidney disease, or unspecified chronic kidney disease: Secondary | ICD-10-CM | POA: Diagnosis not present

## 2018-01-26 DIAGNOSIS — Z7984 Long term (current) use of oral hypoglycemic drugs: Secondary | ICD-10-CM | POA: Diagnosis not present

## 2018-01-26 DIAGNOSIS — M810 Age-related osteoporosis without current pathological fracture: Secondary | ICD-10-CM | POA: Diagnosis not present

## 2018-01-26 DIAGNOSIS — E43 Unspecified severe protein-calorie malnutrition: Secondary | ICD-10-CM | POA: Diagnosis not present

## 2018-01-26 DIAGNOSIS — E1143 Type 2 diabetes mellitus with diabetic autonomic (poly)neuropathy: Secondary | ICD-10-CM | POA: Diagnosis not present

## 2018-01-26 DIAGNOSIS — G214 Vascular parkinsonism: Secondary | ICD-10-CM | POA: Diagnosis not present

## 2018-01-27 DIAGNOSIS — Z87891 Personal history of nicotine dependence: Secondary | ICD-10-CM | POA: Diagnosis not present

## 2018-01-27 DIAGNOSIS — H9193 Unspecified hearing loss, bilateral: Secondary | ICD-10-CM | POA: Diagnosis not present

## 2018-01-27 DIAGNOSIS — D519 Vitamin B12 deficiency anemia, unspecified: Secondary | ICD-10-CM | POA: Diagnosis not present

## 2018-01-27 DIAGNOSIS — Z87440 Personal history of urinary (tract) infections: Secondary | ICD-10-CM | POA: Diagnosis not present

## 2018-01-27 DIAGNOSIS — N182 Chronic kidney disease, stage 2 (mild): Secondary | ICD-10-CM | POA: Diagnosis not present

## 2018-01-27 DIAGNOSIS — M810 Age-related osteoporosis without current pathological fracture: Secondary | ICD-10-CM | POA: Diagnosis not present

## 2018-01-27 DIAGNOSIS — I129 Hypertensive chronic kidney disease with stage 1 through stage 4 chronic kidney disease, or unspecified chronic kidney disease: Secondary | ICD-10-CM | POA: Diagnosis not present

## 2018-01-27 DIAGNOSIS — M5033 Other cervical disc degeneration, cervicothoracic region: Secondary | ICD-10-CM | POA: Diagnosis not present

## 2018-01-27 DIAGNOSIS — R1319 Other dysphagia: Secondary | ICD-10-CM | POA: Diagnosis not present

## 2018-01-27 DIAGNOSIS — M19011 Primary osteoarthritis, right shoulder: Secondary | ICD-10-CM | POA: Diagnosis not present

## 2018-01-27 DIAGNOSIS — Z9181 History of falling: Secondary | ICD-10-CM | POA: Diagnosis not present

## 2018-01-27 DIAGNOSIS — G214 Vascular parkinsonism: Secondary | ICD-10-CM | POA: Diagnosis not present

## 2018-01-27 DIAGNOSIS — G2581 Restless legs syndrome: Secondary | ICD-10-CM | POA: Diagnosis not present

## 2018-01-27 DIAGNOSIS — E559 Vitamin D deficiency, unspecified: Secondary | ICD-10-CM | POA: Diagnosis not present

## 2018-01-27 DIAGNOSIS — I452 Bifascicular block: Secondary | ICD-10-CM | POA: Diagnosis not present

## 2018-01-27 DIAGNOSIS — Z7982 Long term (current) use of aspirin: Secondary | ICD-10-CM | POA: Diagnosis not present

## 2018-01-27 DIAGNOSIS — Z7984 Long term (current) use of oral hypoglycemic drugs: Secondary | ICD-10-CM | POA: Diagnosis not present

## 2018-01-27 DIAGNOSIS — M19012 Primary osteoarthritis, left shoulder: Secondary | ICD-10-CM | POA: Diagnosis not present

## 2018-01-27 DIAGNOSIS — M1711 Unilateral primary osteoarthritis, right knee: Secondary | ICD-10-CM | POA: Diagnosis not present

## 2018-01-27 DIAGNOSIS — E1143 Type 2 diabetes mellitus with diabetic autonomic (poly)neuropathy: Secondary | ICD-10-CM | POA: Diagnosis not present

## 2018-01-27 DIAGNOSIS — E43 Unspecified severe protein-calorie malnutrition: Secondary | ICD-10-CM | POA: Diagnosis not present

## 2018-01-27 DIAGNOSIS — E1122 Type 2 diabetes mellitus with diabetic chronic kidney disease: Secondary | ICD-10-CM | POA: Diagnosis not present

## 2018-01-31 DIAGNOSIS — E1143 Type 2 diabetes mellitus with diabetic autonomic (poly)neuropathy: Secondary | ICD-10-CM | POA: Diagnosis not present

## 2018-01-31 DIAGNOSIS — G214 Vascular parkinsonism: Secondary | ICD-10-CM | POA: Diagnosis not present

## 2018-01-31 DIAGNOSIS — E1122 Type 2 diabetes mellitus with diabetic chronic kidney disease: Secondary | ICD-10-CM | POA: Diagnosis not present

## 2018-01-31 DIAGNOSIS — M5033 Other cervical disc degeneration, cervicothoracic region: Secondary | ICD-10-CM | POA: Diagnosis not present

## 2018-01-31 DIAGNOSIS — M810 Age-related osteoporosis without current pathological fracture: Secondary | ICD-10-CM | POA: Diagnosis not present

## 2018-01-31 DIAGNOSIS — M19011 Primary osteoarthritis, right shoulder: Secondary | ICD-10-CM | POA: Diagnosis not present

## 2018-01-31 DIAGNOSIS — Z87440 Personal history of urinary (tract) infections: Secondary | ICD-10-CM | POA: Diagnosis not present

## 2018-01-31 DIAGNOSIS — I452 Bifascicular block: Secondary | ICD-10-CM | POA: Diagnosis not present

## 2018-01-31 DIAGNOSIS — H9193 Unspecified hearing loss, bilateral: Secondary | ICD-10-CM | POA: Diagnosis not present

## 2018-01-31 DIAGNOSIS — E559 Vitamin D deficiency, unspecified: Secondary | ICD-10-CM | POA: Diagnosis not present

## 2018-01-31 DIAGNOSIS — N182 Chronic kidney disease, stage 2 (mild): Secondary | ICD-10-CM | POA: Diagnosis not present

## 2018-01-31 DIAGNOSIS — G2581 Restless legs syndrome: Secondary | ICD-10-CM | POA: Diagnosis not present

## 2018-01-31 DIAGNOSIS — Z7982 Long term (current) use of aspirin: Secondary | ICD-10-CM | POA: Diagnosis not present

## 2018-01-31 DIAGNOSIS — R1319 Other dysphagia: Secondary | ICD-10-CM | POA: Diagnosis not present

## 2018-01-31 DIAGNOSIS — D519 Vitamin B12 deficiency anemia, unspecified: Secondary | ICD-10-CM | POA: Diagnosis not present

## 2018-01-31 DIAGNOSIS — Z7984 Long term (current) use of oral hypoglycemic drugs: Secondary | ICD-10-CM | POA: Diagnosis not present

## 2018-01-31 DIAGNOSIS — Z9181 History of falling: Secondary | ICD-10-CM | POA: Diagnosis not present

## 2018-01-31 DIAGNOSIS — M1711 Unilateral primary osteoarthritis, right knee: Secondary | ICD-10-CM | POA: Diagnosis not present

## 2018-01-31 DIAGNOSIS — Z87891 Personal history of nicotine dependence: Secondary | ICD-10-CM | POA: Diagnosis not present

## 2018-01-31 DIAGNOSIS — I129 Hypertensive chronic kidney disease with stage 1 through stage 4 chronic kidney disease, or unspecified chronic kidney disease: Secondary | ICD-10-CM | POA: Diagnosis not present

## 2018-01-31 DIAGNOSIS — E43 Unspecified severe protein-calorie malnutrition: Secondary | ICD-10-CM | POA: Diagnosis not present

## 2018-01-31 DIAGNOSIS — M19012 Primary osteoarthritis, left shoulder: Secondary | ICD-10-CM | POA: Diagnosis not present

## 2018-02-01 ENCOUNTER — Other Ambulatory Visit: Payer: Self-pay

## 2018-02-01 ENCOUNTER — Emergency Department (HOSPITAL_COMMUNITY): Payer: Medicare Other

## 2018-02-01 ENCOUNTER — Telehealth: Payer: Self-pay | Admitting: *Deleted

## 2018-02-01 ENCOUNTER — Emergency Department (HOSPITAL_COMMUNITY)
Admission: EM | Admit: 2018-02-01 | Discharge: 2018-02-02 | Disposition: A | Discharge status: Home / Self Care | Payer: Medicare Other | Attending: Emergency Medicine | Admitting: Emergency Medicine

## 2018-02-01 DIAGNOSIS — G20 Parkinson's disease: Secondary | ICD-10-CM | POA: Diagnosis not present

## 2018-02-01 DIAGNOSIS — R05 Cough: Secondary | ICD-10-CM | POA: Insufficient documentation

## 2018-02-01 DIAGNOSIS — R531 Weakness: Secondary | ICD-10-CM

## 2018-02-01 DIAGNOSIS — R0602 Shortness of breath: Secondary | ICD-10-CM | POA: Diagnosis not present

## 2018-02-01 DIAGNOSIS — E1143 Type 2 diabetes mellitus with diabetic autonomic (poly)neuropathy: Secondary | ICD-10-CM | POA: Diagnosis not present

## 2018-02-01 DIAGNOSIS — G4736 Sleep related hypoventilation in conditions classified elsewhere: Secondary | ICD-10-CM | POA: Diagnosis not present

## 2018-02-01 DIAGNOSIS — I4891 Unspecified atrial fibrillation: Secondary | ICD-10-CM | POA: Diagnosis not present

## 2018-02-01 DIAGNOSIS — Z87891 Personal history of nicotine dependence: Secondary | ICD-10-CM | POA: Diagnosis not present

## 2018-02-01 DIAGNOSIS — R059 Cough, unspecified: Secondary | ICD-10-CM

## 2018-02-01 DIAGNOSIS — I4510 Unspecified right bundle-branch block: Secondary | ICD-10-CM | POA: Diagnosis not present

## 2018-02-01 DIAGNOSIS — I10 Essential (primary) hypertension: Secondary | ICD-10-CM | POA: Diagnosis not present

## 2018-02-01 DIAGNOSIS — R0689 Other abnormalities of breathing: Secondary | ICD-10-CM | POA: Diagnosis not present

## 2018-02-01 DIAGNOSIS — E039 Hypothyroidism, unspecified: Secondary | ICD-10-CM | POA: Diagnosis not present

## 2018-02-01 DIAGNOSIS — R0981 Nasal congestion: Secondary | ICD-10-CM | POA: Diagnosis not present

## 2018-02-01 DIAGNOSIS — Z743 Need for continuous supervision: Secondary | ICD-10-CM | POA: Diagnosis not present

## 2018-02-01 LAB — CBC WITH DIFFERENTIAL/PLATELET
Abs Immature Granulocytes: 0.05 10*3/uL (ref 0.00–0.07)
BASOS ABS: 0.1 10*3/uL (ref 0.0–0.1)
BASOS PCT: 1 %
EOS ABS: 0.1 10*3/uL (ref 0.0–0.5)
Eosinophils Relative: 0 %
HEMATOCRIT: 45.9 % (ref 39.0–52.0)
Hemoglobin: 14.6 g/dL (ref 13.0–17.0)
Immature Granulocytes: 0 %
Lymphocytes Relative: 11 %
Lymphs Abs: 1.4 10*3/uL (ref 0.7–4.0)
MCH: 31.4 pg (ref 26.0–34.0)
MCHC: 31.8 g/dL (ref 30.0–36.0)
MCV: 98.7 fL (ref 80.0–100.0)
Monocytes Absolute: 0.6 10*3/uL (ref 0.1–1.0)
Monocytes Relative: 5 %
NEUTROS PCT: 83 %
NRBC: 0 % (ref 0.0–0.2)
Neutro Abs: 10.7 10*3/uL — ABNORMAL HIGH (ref 1.7–7.7)
PLATELETS: 251 10*3/uL (ref 150–400)
RBC: 4.65 MIL/uL (ref 4.22–5.81)
RDW: 13.4 % (ref 11.5–15.5)
WBC: 12.9 10*3/uL — ABNORMAL HIGH (ref 4.0–10.5)

## 2018-02-01 LAB — COMPREHENSIVE METABOLIC PANEL
ALBUMIN: 3.7 g/dL (ref 3.5–5.0)
ALT: 9 U/L (ref 0–44)
ANION GAP: 12 (ref 5–15)
AST: 24 U/L (ref 15–41)
Alkaline Phosphatase: 61 U/L (ref 38–126)
BILIRUBIN TOTAL: 0.6 mg/dL (ref 0.3–1.2)
BUN: 20 mg/dL (ref 8–23)
CO2: 28 mmol/L (ref 22–32)
Calcium: 9.6 mg/dL (ref 8.9–10.3)
Chloride: 102 mmol/L (ref 98–111)
Creatinine, Ser: 1.02 mg/dL (ref 0.61–1.24)
GFR calc Af Amer: >60 mL/min (ref >60)
GLUCOSE: 155 mg/dL — AB (ref 70–99)
POTASSIUM: 3.6 mmol/L (ref 3.5–5.1)
Sodium: 142 mmol/L (ref 135–145)
TOTAL PROTEIN: 6.7 g/dL (ref 6.5–8.1)

## 2018-02-01 LAB — URINALYSIS, ROUTINE W REFLEX MICROSCOPIC
Bilirubin Urine: NEGATIVE
Glucose, UA: NEGATIVE mg/dL
Hgb urine dipstick: NEGATIVE
KETONES UR: NEGATIVE mg/dL
LEUKOCYTES UA: NEGATIVE
NITRITE: NEGATIVE
PROTEIN: NEGATIVE mg/dL
Specific Gravity, Urine: 1.02 (ref 1.005–1.030)
pH: 8 (ref 5.0–8.0)

## 2018-02-01 LAB — LACTIC ACID, PLASMA
LACTIC ACID, VENOUS: 4.4 mmol/L — AB (ref 0.5–1.9)
Lactic Acid, Venous: 4 mmol/L (ref 0.5–1.9)

## 2018-02-01 LAB — TROPONIN I

## 2018-02-01 MED ORDER — SODIUM CHLORIDE 0.9 % IV SOLN
500.0000 mg | Freq: Once | INTRAVENOUS | Status: AC
  Administered 2018-02-01: 500 mg via INTRAVENOUS
  Filled 2018-02-01: qty 500

## 2018-02-01 MED ORDER — AMOXICILLIN-POT CLAVULANATE 875-125 MG PO TABS: 1.0000 | ORAL_TABLET | Freq: Two times a day (BID) | ORAL | 0 refills | Status: DC

## 2018-02-01 MED ORDER — LACTATED RINGERS IV BOLUS
1000.0000 mL | Freq: Once | INTRAVENOUS | Status: AC
  Administered 2018-02-01: 1000 mL via INTRAVENOUS

## 2018-02-01 MED ORDER — IOPAMIDOL (ISOVUE-370) INJECTION 76%
100.0000 mL | Freq: Once | INTRAVENOUS | Status: AC | PRN
  Administered 2018-02-01: 100 mL via INTRAVENOUS

## 2018-02-01 MED ORDER — SODIUM CHLORIDE 0.9 % IV SOLN
1.0000 g | Freq: Once | INTRAVENOUS | Status: AC
  Administered 2018-02-01: 1 g via INTRAVENOUS
  Filled 2018-02-01: qty 10

## 2018-02-01 NOTE — ED Notes (Signed)
Date and time results received: 02/01/18 7:28 PM  (use smartphrase ".now" to insert current time)  Test: Lactic Acid Critical Value:4.4  Name of Provider Notified: Dr.Mesner  Orders Received? Or Actions Taken?:

## 2018-02-01 NOTE — ED Notes (Signed)
Bed: WA21 Expected date:  Expected time:  Means of arrival:  Comments: EMS-[facility]-room 21
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
## 2018-02-01 NOTE — ED Notes (Signed)
When preparing to perform I&O cath, pt had incontinent episode and staff able to collect urine midstream. I&O cath unnecessary at this time and condom cath in place.

## 2018-02-01 NOTE — ED Notes (Signed)
Patient transported to X-ray 

## 2018-02-01 NOTE — ED Provider Notes (Signed)
Emergency Department Provider Note   I have reviewed the triage vital signs and the nursing notes.   HISTORY  Chief Complaint Cough; Shortness of Breath; and Weakness   HPI Nicholas Barnett is a 84 y.o. male with multiple medical problems as documented below that has a history of multiple aspiration pneumonias who presents the emergency department today from urgent care secondary to cough, shortness of breath, chest pain and reported hypoxia.  So the patient was hypoxic to 89% while at the urgent care.  Here the family states is been having a productive cough but no known fevers.  Has had increased weakness.  She states is exactly how he is when he has had pneumonia in the past. No other associated or modifying symptoms.   Level V Caveat Secondary to Poor Memory  Past Medical History:  Diagnosis Date  . Abnormality of gait   . Arthritis   . Bifascicular block   . Cyanocobalamin deficiency   . Depression   . Diverticulitis   . Dizzy   . Dysphagia, idiopathic    History of aspiration  . Gait disturbance   . Hearing loss of both ears   . Hyperlipidemia   . Hypertension   . Lumbar back pain   . Memory loss   . Neck pain   . Osteoarthritis   . Osteoporosis   . Other and unspecified hyperlipidemia   . Parkinsonism (HCC)   . Recurrent falls    Using a cane and walker  . Restless leg   . Restless legs syndrome (RLS)   . Right knee pain   . Shoulder pain   . Thoracic back pain   . Thyroid disease   . Type 2 diabetes mellitus with autonomic neuropathy (HCC)   . Unspecified essential hypertension   . Unspecified hypothyroidism   . Vitamin D deficiency     Patient Active Problem List   Diagnosis Date Noted  . HCAP (healthcare-associated pneumonia) 01/04/2018  . Pneumonia 01/03/2018  . Parkinson disease (HCC) 12/24/2017  . Protein-calorie malnutrition, severe 11/24/2017  . Syncope and collapse 11/21/2017  . Troponin level elevated   . Goals of care,  counseling/discussion   . Palliative care by specialist   . Sepsis (HCC) 01/22/2017  . Acute respiratory failure with hypoxia (HCC) 01/22/2017  . Non-insulin dependent type 2 diabetes mellitus (HCC) 01/22/2017  . Acute renal failure superimposed on stage 2 chronic kidney disease (HCC) 01/22/2017  . Aspiration pneumonia (HCC) 01/22/2017  . Fever, unspecified 10/16/2015  . Depression, major 10/16/2015  . GERD (gastroesophageal reflux disease) 04/17/2015  . UTI (urinary tract infection) 10/17/2014  . Pain in the chest   . Chest pain 07/28/2014  . Abnormal EKG 07/28/2014  . Insomnia 10/17/2013  . Pain in joint, shoulder region 01/25/2013  . Dysphagia, idiopathic   . Recurrent falls   . Dizzy   . Hearing loss of both ears   . Neck pain   . Right knee pain   . Thoracic back pain   . Lumbar back pain   . Shoulder pain   . Cyanocobalamin deficiency   . Type 2 diabetes mellitus with autonomic neuropathy (HCC)   . Restless legs syndrome (RLS)   . Parkinson's plus syndrome (HCC) 11/24/2012  . Gait difficulty 11/24/2012  . NOCTURIA 05/08/2009  . Memory loss 04/11/2009  . ECZEMA 03/21/2009  . BPH (benign prostatic hyperplasia) 12/25/2008  . OSTEOARTHRITIS, KNEE, RIGHT 12/25/2008  . VITAMIN D DEFICIENCY 12/22/2007  . DIVERTICULITIS, HX OF  06/01/2007  . ERECTILE DYSFUNCTION 12/16/2006  . Essential hypertension 12/16/2006    Past Surgical History:  Procedure Laterality Date  . CATARACT EXTRACTION Bilateral 2012   Dr. Hazle Quantigby    Current Outpatient Rx  . Order #: [id number] Class: Historical Med  . Order #: [id number] Class: Historical Med  . Order #: [id number] Class: No Print  . Order #: [id number] Class: Historical Med  . Order #: [id number] Class: Historical Med  . Order #: [id number] Class: Normal  . Order #: [id number] Class: Normal  . Order #: [id number] Class: Normal  . Order #: [id number] Class: Normal  . Order #: [id number] Class: Normal  . Order #: [id number] Class: Normal  . Order #:  [id number] Class: Normal  . Order #: [id number] Class: Normal  . Order #: [id number] Class: Historical Med  . Order #: [id number] Class: Normal  . Order #: [id number] Class: Historical Med  . Order #: [id number] Class: Historical Med  . Order #: [id number] Class: Normal  . Order #: [id number] Class: Print  . Order #: [id number] Class: Normal  . Order #: [id number] Class: Normal    Allergies Caffeine  Family History  Problem Relation Age of Onset  . Heart disease Mother   . Diabetes Mother   . Stroke Mother   . Cancer Father        prostate  . Diabetes Sister   . Heart disease Brother     Social History Social History   Tobacco Use  . Smoking status: Former Smoker    Last attempt to quit: 01/31/1974    Years since quitting: 44.0  . Smokeless tobacco: Never Used  Substance Use Topics  . Alcohol use: No    Alcohol/week: 0.0 standard drinks  . Drug use: No    Review of Systems  All other systems negative except as documented in the HPI. All pertinent positives and negatives as reviewed in the HPI. ____________________________________________   PHYSICAL EXAM:  VITAL SIGNS: ED Triage Vitals  Enc Vitals Group     BP 02/01/18 1736 (!) 167/108     Pulse Rate 02/01/18 1736 90     Resp 02/01/18 1736 (!) 28     Temp 02/01/18 1736 98.3 F (36.8 C)     Temp Source 02/01/18 1736 Oral     SpO2 02/01/18 1722 92 %     Weight 02/01/18 1737 147 lb 0.8 oz (66.7 kg)     Height 02/01/18 1737 6\' 1"  (1.854 m)    Constitutional: Alert and oriented. Well appearing and in no acute distress. Eyes: Conjunctivae are normal. PERRL. EOMI. Head: Atraumatic. Nose: No congestion/rhinnorhea. Mouth/Throat: Mucous membranes are moist.  Oropharynx non-erythematous. Neck: No stridor.  No meningeal signs.   Cardiovascular: Normal rate, regular rhythm. Good peripheral circulation. Grossly normal heart sounds.   Respiratory: tachypneic respiratory effort.  No retractions. Lungs with bilateral lower  crackles. Gastrointestinal: Soft and nontender. No distention.  Musculoskeletal: No lower extremity tenderness nor edema. No gross deformities of extremities. Neurologic:  Normal speech and language. No gross focal neurologic deficits are appreciated.  Skin:  Skin is warm, dry and intact. No rash noted.  ____________________________________________   LABS (all labs ordered are listed, but only abnormal results are displayed)  Labs Reviewed  CBC WITH DIFFERENTIAL/PLATELET - Abnormal; Notable for the following components:      Result Value   WBC 12.9 (*)    Neutro Abs 10.7 (*)    All other components within normal limits  COMPREHENSIVE METABOLIC PANEL - Abnormal; Notable for the following components:   Glucose, Bld 155 (*)    All other components  within normal limits  URINALYSIS, ROUTINE W REFLEX MICROSCOPIC - Abnormal; Notable for the following components:   Color, Urine STRAW (*)    All other components within normal limits  LACTIC ACID, PLASMA - Abnormal; Notable for the following components:   Lactic Acid, Venous 4.4 (*)    All other components within normal limits  LACTIC ACID, PLASMA - Abnormal; Notable for the following components:   Lactic Acid, Venous 4.0 (*)    All other components within normal limits  TROPONIN I   ____________________________________________  EKG   EKG Interpretation  Date/Time:  Tuesday February 01 2018 17:40:55 EST Ventricular Rate:  91 PR Interval:    QRS Duration: 147 QT Interval:  392 QTC Calculation: 483 R Axis:   -88 Text Interpretation:  Sinus rhythm Prolonged PR interval RBBB and LAFB No significant change since last tracing Confirmed by Marily Memos [phone]) on 02/01/2018 6:41:55 PM       ____________________________________________  RADIOLOGY  Dg Chest 2 View  Result Date: 02/01/2018 CLINICAL DATA:  Short of breath EXAM: CHEST - 2 VIEW COMPARISON:  01/03/2018 FINDINGS: The heart size and mediastinal contours are within  normal limits. Both lungs are clear. The visualized skeletal structures are unremarkable. IMPRESSION: No active cardiopulmonary disease. Electronically Signed   By: Marlan Palau M.D.   On: 02/01/2018 18:18   Ct Angio Chest Pe W And/or Wo Contrast  Result Date: 02/01/2018 CLINICAL DATA:  Shortness of breath and cough for several days EXAM: CT ANGIOGRAPHY CHEST WITH CONTRAST TECHNIQUE: Multidetector CT imaging of the chest was performed using the standard protocol during bolus administration of intravenous contrast. Multiplanar CT image reconstructions and MIPs were obtained to evaluate the vascular anatomy. CONTRAST:  ISOVUE-370 IOPAMIDOL (ISOVUE-370) INJECTION 76% COMPARISON:  Chest x-ray from earlier in the same day. FINDINGS: Cardiovascular: Thoracic aorta demonstrates atherosclerotic calcification without aneurysmal dilatation. Opacification is limited and dissection evaluation is not able to be performed. No cardiac enlargement is seen. The pulmonary artery demonstrates a normal branching pattern. No central pulmonary embolus is seen. The smaller peripheral branches are somewhat difficult to evaluate due to the timing of the contrast bolus as well as patient motion artifact. Coronary calcifications are noted. Mediastinum/Nodes: The thoracic inlet is within normal limits. No hilar or mediastinal adenopathy is noted. The esophagus as visualized is within normal limits. Lungs/Pleura: Lungs are well aerated bilaterally. No focal infiltrate or sizable effusion is seen. No sizable parenchymal nodule is noted. Upper Abdomen: Visualized upper abdomen is within normal limits. Musculoskeletal: Degenerative changes of the thoracic spine are noted. No acute bony abnormality is seen. Review of the MIP images confirms the above findings. IMPRESSION: No definitive pulmonary emboli identified although evaluation is slightly limited by patient motion artifact. No acute abnormality noted. Aortic Atherosclerosis  (ICD10-I70.0). Electronically Signed   By: Alcide Clever M.D.   On: 02/01/2018 20:55    ____________________________________________   PROCEDURES  Procedure(s) performed:   Procedures   ____________________________________________   INITIAL IMPRESSION / ASSESSMENT AND PLAN / ED COURSE  eval for pneumonia vs other causes. Consider CT if cxr clear.   Ct and xr clear. Lactic acid improving, likely related to dehydration. Planned for admission however stated that she never said he had a afever where I was worried about an occult infection. They prefer to go home. Plan for finishing fluids, start augmentin for possible early pneumonia and discharge. Home.      Pertinent labs & imaging results that were available during my care of the  patient were reviewed by me and considered in my medical decision making (see chart for details).  ____________________________________________  FINAL CLINICAL IMPRESSION(S) / ED DIAGNOSES  Final diagnoses:  Cough  Weakness     MEDICATIONS GIVEN DURING THIS VISIT:  Medications  lactated ringers bolus 1,000 mL (0 mLs Intravenous Stopped 02/01/18 2027)  cefTRIAXone (ROCEPHIN) 1 g in sodium chloride 0.9 % 100 mL IVPB (0 g Intravenous Stopped 02/01/18 2140)  azithromycin (ZITHROMAX) 500 mg in sodium chloride 0.9 % 250 mL IVPB (0 mg Intravenous Stopped 02/01/18 2257)  iopamidol (ISOVUE-370) 76 % injection 100 mL (100 mLs Intravenous Contrast Given 02/01/18 2027)  lactated ringers bolus 1,000 mL (0 mLs Intravenous Stopped 02/02/18 0026)     NEW OUTPATIENT MEDICATIONS STARTED DURING THIS VISIT:  New Prescriptions   AMOXICILLIN-CLAVULANATE (AUGMENTIN) 875-125 MG TABLET    Take 1 tablet by mouth 2 (two) times daily. One po bid x 7 days    Note:  This note was prepared with assistance of Dragon voice recognition software. Occasional wrong-word or sound-a-like substitutions may have occurred due to the inherent limitations of voice recognition  software.   Mende Biswell, Barbara Cower, MD 02/02/18 [phone]

## 2018-02-01 NOTE — ED Notes (Signed)
Patient transported to CT 

## 2018-02-01 NOTE — ED Triage Notes (Signed)
Pt BIB EMS from Urgent Care with c/o SHOB, weakness, cough x4 days. Wife states he was here a month ago and dx with aspirational pneumonia that he never has gotten completely over.  Wife reports green sputum production at home.  UC gave 1 albuterol neb tx w/ no improvement, put on 4L O2 and sats 89%.  Urgent care concerned for pneumonia. Hx of parkinsons and dementia. AOx2.   Pt c/o of weakness x4 days progressively getting worse.   CBG 154

## 2018-02-02 NOTE — Progress Notes (Addendum)
84 Y O M with hx of dementia, Parkinson's, multiple aspiration pneumonia episodes.  Presented with shortness of breath weakness and cough for 4 days.  Daughter and daughter at bedside, denies fever at home.  Reports chronic poor p.o. Intake.  Patient aspirates with every bites.  Has not done well with dysphagia diet.  No wounds/ulcers on his back.  No vomiting no loose stools or abdominal pain.  Admission ~ 1 month ago for aspiration pneumonia family requested discharge the next day.  In the ED-O2 sats greater than 95% on room air.  Tachypneic.  Blood pressure 150s to 180s.  WBC- 12.9.  CTA chest negative for acute abnormality.  UA- clean.  Lactic acid was elevated at 4.4 > 4, after 1L bolus given.  Ceftriaxone and azithromycin given.  On my evaluation- exam is unremarkable, lactic acidosis likely secondary to dehydration, No focus of infection identified-respiratory, GI, urinary, skin, neurologic.  I was called to admit patient but family declined, requesting patient be discharged to home.  Related patient family request to ED provider.  Disposition Per ED provider.  Patient may benefit from palliative care.  Kennis CarinaEjiroghene Emokpae, MD [facility]. 02/02/2018. 2:18 AM   LOS- NO CHARGE.

## 2018-02-04 DIAGNOSIS — M5033 Other cervical disc degeneration, cervicothoracic region: Secondary | ICD-10-CM | POA: Diagnosis not present

## 2018-02-04 DIAGNOSIS — I129 Hypertensive chronic kidney disease with stage 1 through stage 4 chronic kidney disease, or unspecified chronic kidney disease: Secondary | ICD-10-CM | POA: Diagnosis not present

## 2018-02-04 DIAGNOSIS — M1711 Unilateral primary osteoarthritis, right knee: Secondary | ICD-10-CM | POA: Diagnosis not present

## 2018-02-04 DIAGNOSIS — E1143 Type 2 diabetes mellitus with diabetic autonomic (poly)neuropathy: Secondary | ICD-10-CM | POA: Diagnosis not present

## 2018-02-04 DIAGNOSIS — R1319 Other dysphagia: Secondary | ICD-10-CM | POA: Diagnosis not present

## 2018-02-04 DIAGNOSIS — M19012 Primary osteoarthritis, left shoulder: Secondary | ICD-10-CM | POA: Diagnosis not present

## 2018-02-04 DIAGNOSIS — Z87891 Personal history of nicotine dependence: Secondary | ICD-10-CM | POA: Diagnosis not present

## 2018-02-04 DIAGNOSIS — E559 Vitamin D deficiency, unspecified: Secondary | ICD-10-CM | POA: Diagnosis not present

## 2018-02-04 DIAGNOSIS — I452 Bifascicular block: Secondary | ICD-10-CM | POA: Diagnosis not present

## 2018-02-04 DIAGNOSIS — D519 Vitamin B12 deficiency anemia, unspecified: Secondary | ICD-10-CM | POA: Diagnosis not present

## 2018-02-04 DIAGNOSIS — E1122 Type 2 diabetes mellitus with diabetic chronic kidney disease: Secondary | ICD-10-CM | POA: Diagnosis not present

## 2018-02-04 DIAGNOSIS — G2581 Restless legs syndrome: Secondary | ICD-10-CM | POA: Diagnosis not present

## 2018-02-04 DIAGNOSIS — H9193 Unspecified hearing loss, bilateral: Secondary | ICD-10-CM | POA: Diagnosis not present

## 2018-02-04 DIAGNOSIS — Z7982 Long term (current) use of aspirin: Secondary | ICD-10-CM | POA: Diagnosis not present

## 2018-02-04 DIAGNOSIS — Z9181 History of falling: Secondary | ICD-10-CM | POA: Diagnosis not present

## 2018-02-04 DIAGNOSIS — Z87440 Personal history of urinary (tract) infections: Secondary | ICD-10-CM | POA: Diagnosis not present

## 2018-02-04 DIAGNOSIS — G214 Vascular parkinsonism: Secondary | ICD-10-CM | POA: Diagnosis not present

## 2018-02-04 DIAGNOSIS — E43 Unspecified severe protein-calorie malnutrition: Secondary | ICD-10-CM | POA: Diagnosis not present

## 2018-02-04 DIAGNOSIS — M19011 Primary osteoarthritis, right shoulder: Secondary | ICD-10-CM | POA: Diagnosis not present

## 2018-02-04 DIAGNOSIS — M810 Age-related osteoporosis without current pathological fracture: Secondary | ICD-10-CM | POA: Diagnosis not present

## 2018-02-04 DIAGNOSIS — N182 Chronic kidney disease, stage 2 (mild): Secondary | ICD-10-CM | POA: Diagnosis not present

## 2018-02-04 DIAGNOSIS — Z7984 Long term (current) use of oral hypoglycemic drugs: Secondary | ICD-10-CM | POA: Diagnosis not present

## 2018-02-07 ENCOUNTER — Other Ambulatory Visit: Payer: Self-pay | Admitting: Internal Medicine

## 2018-02-07 ENCOUNTER — Telehealth: Payer: Self-pay | Admitting: *Deleted

## 2018-02-07 DIAGNOSIS — Z87891 Personal history of nicotine dependence: Secondary | ICD-10-CM | POA: Diagnosis not present

## 2018-02-07 DIAGNOSIS — H9193 Unspecified hearing loss, bilateral: Secondary | ICD-10-CM | POA: Diagnosis not present

## 2018-02-07 DIAGNOSIS — M5033 Other cervical disc degeneration, cervicothoracic region: Secondary | ICD-10-CM | POA: Diagnosis not present

## 2018-02-07 DIAGNOSIS — N182 Chronic kidney disease, stage 2 (mild): Secondary | ICD-10-CM | POA: Diagnosis not present

## 2018-02-07 DIAGNOSIS — E1143 Type 2 diabetes mellitus with diabetic autonomic (poly)neuropathy: Secondary | ICD-10-CM | POA: Diagnosis not present

## 2018-02-07 DIAGNOSIS — J189 Pneumonia, unspecified organism: Secondary | ICD-10-CM | POA: Diagnosis not present

## 2018-02-07 DIAGNOSIS — G20 Parkinson's disease: Secondary | ICD-10-CM | POA: Diagnosis not present

## 2018-02-07 DIAGNOSIS — R1312 Dysphagia, oropharyngeal phase: Secondary | ICD-10-CM | POA: Diagnosis not present

## 2018-02-07 DIAGNOSIS — G214 Vascular parkinsonism: Secondary | ICD-10-CM | POA: Diagnosis not present

## 2018-02-07 DIAGNOSIS — J9601 Acute respiratory failure with hypoxia: Secondary | ICD-10-CM | POA: Diagnosis not present

## 2018-02-07 DIAGNOSIS — G4700 Insomnia, unspecified: Secondary | ICD-10-CM | POA: Diagnosis not present

## 2018-02-07 DIAGNOSIS — R1319 Other dysphagia: Secondary | ICD-10-CM | POA: Diagnosis not present

## 2018-02-07 DIAGNOSIS — Z7984 Long term (current) use of oral hypoglycemic drugs: Secondary | ICD-10-CM | POA: Diagnosis not present

## 2018-02-07 DIAGNOSIS — E1122 Type 2 diabetes mellitus with diabetic chronic kidney disease: Secondary | ICD-10-CM | POA: Diagnosis not present

## 2018-02-07 DIAGNOSIS — E785 Hyperlipidemia, unspecified: Secondary | ICD-10-CM | POA: Diagnosis not present

## 2018-02-07 DIAGNOSIS — Z8673 Personal history of transient ischemic attack (TIA), and cerebral infarction without residual deficits: Secondary | ICD-10-CM | POA: Diagnosis not present

## 2018-02-07 DIAGNOSIS — M810 Age-related osteoporosis without current pathological fracture: Secondary | ICD-10-CM | POA: Diagnosis not present

## 2018-02-07 DIAGNOSIS — I129 Hypertensive chronic kidney disease with stage 1 through stage 4 chronic kidney disease, or unspecified chronic kidney disease: Secondary | ICD-10-CM | POA: Diagnosis not present

## 2018-02-07 DIAGNOSIS — G2581 Restless legs syndrome: Secondary | ICD-10-CM | POA: Diagnosis not present

## 2018-02-07 DIAGNOSIS — G232 Striatonigral degeneration: Secondary | ICD-10-CM | POA: Diagnosis not present

## 2018-02-07 NOTE — Telephone Encounter (Signed)
Jae DireKate with [facility] called requesting an extension for PT by a couple of weeks due to patient being in ER for suspected pneumonia.   Verbal given to Nurse.

## 2018-02-09 DIAGNOSIS — E1143 Type 2 diabetes mellitus with diabetic autonomic (poly)neuropathy: Secondary | ICD-10-CM | POA: Diagnosis not present

## 2018-02-09 DIAGNOSIS — G232 Striatonigral degeneration: Secondary | ICD-10-CM | POA: Diagnosis not present

## 2018-02-09 DIAGNOSIS — M5033 Other cervical disc degeneration, cervicothoracic region: Secondary | ICD-10-CM | POA: Diagnosis not present

## 2018-02-09 DIAGNOSIS — R1319 Other dysphagia: Secondary | ICD-10-CM | POA: Diagnosis not present

## 2018-02-09 DIAGNOSIS — J189 Pneumonia, unspecified organism: Secondary | ICD-10-CM | POA: Diagnosis not present

## 2018-02-09 DIAGNOSIS — M810 Age-related osteoporosis without current pathological fracture: Secondary | ICD-10-CM | POA: Diagnosis not present

## 2018-02-09 DIAGNOSIS — I129 Hypertensive chronic kidney disease with stage 1 through stage 4 chronic kidney disease, or unspecified chronic kidney disease: Secondary | ICD-10-CM | POA: Diagnosis not present

## 2018-02-09 DIAGNOSIS — G214 Vascular parkinsonism: Secondary | ICD-10-CM | POA: Diagnosis not present

## 2018-02-09 DIAGNOSIS — G4700 Insomnia, unspecified: Secondary | ICD-10-CM | POA: Diagnosis not present

## 2018-02-09 DIAGNOSIS — Z8673 Personal history of transient ischemic attack (TIA), and cerebral infarction without residual deficits: Secondary | ICD-10-CM | POA: Diagnosis not present

## 2018-02-09 DIAGNOSIS — G2581 Restless legs syndrome: Secondary | ICD-10-CM | POA: Diagnosis not present

## 2018-02-09 DIAGNOSIS — Z7984 Long term (current) use of oral hypoglycemic drugs: Secondary | ICD-10-CM | POA: Diagnosis not present

## 2018-02-09 DIAGNOSIS — H9193 Unspecified hearing loss, bilateral: Secondary | ICD-10-CM | POA: Diagnosis not present

## 2018-02-09 DIAGNOSIS — E785 Hyperlipidemia, unspecified: Secondary | ICD-10-CM | POA: Diagnosis not present

## 2018-02-09 DIAGNOSIS — E1122 Type 2 diabetes mellitus with diabetic chronic kidney disease: Secondary | ICD-10-CM | POA: Diagnosis not present

## 2018-02-09 DIAGNOSIS — Z87891 Personal history of nicotine dependence: Secondary | ICD-10-CM | POA: Diagnosis not present

## 2018-02-09 DIAGNOSIS — N182 Chronic kidney disease, stage 2 (mild): Secondary | ICD-10-CM | POA: Diagnosis not present

## 2018-02-11 DIAGNOSIS — G2581 Restless legs syndrome: Secondary | ICD-10-CM | POA: Diagnosis not present

## 2018-02-11 DIAGNOSIS — M5033 Other cervical disc degeneration, cervicothoracic region: Secondary | ICD-10-CM | POA: Diagnosis not present

## 2018-02-11 DIAGNOSIS — N182 Chronic kidney disease, stage 2 (mild): Secondary | ICD-10-CM | POA: Diagnosis not present

## 2018-02-11 DIAGNOSIS — E1122 Type 2 diabetes mellitus with diabetic chronic kidney disease: Secondary | ICD-10-CM | POA: Diagnosis not present

## 2018-02-11 DIAGNOSIS — H9193 Unspecified hearing loss, bilateral: Secondary | ICD-10-CM | POA: Diagnosis not present

## 2018-02-11 DIAGNOSIS — Z8673 Personal history of transient ischemic attack (TIA), and cerebral infarction without residual deficits: Secondary | ICD-10-CM | POA: Diagnosis not present

## 2018-02-11 DIAGNOSIS — G232 Striatonigral degeneration: Secondary | ICD-10-CM | POA: Diagnosis not present

## 2018-02-11 DIAGNOSIS — E785 Hyperlipidemia, unspecified: Secondary | ICD-10-CM | POA: Diagnosis not present

## 2018-02-11 DIAGNOSIS — M810 Age-related osteoporosis without current pathological fracture: Secondary | ICD-10-CM | POA: Diagnosis not present

## 2018-02-11 DIAGNOSIS — E1143 Type 2 diabetes mellitus with diabetic autonomic (poly)neuropathy: Secondary | ICD-10-CM | POA: Diagnosis not present

## 2018-02-11 DIAGNOSIS — G214 Vascular parkinsonism: Secondary | ICD-10-CM | POA: Diagnosis not present

## 2018-02-11 DIAGNOSIS — J189 Pneumonia, unspecified organism: Secondary | ICD-10-CM | POA: Diagnosis not present

## 2018-02-11 DIAGNOSIS — R1319 Other dysphagia: Secondary | ICD-10-CM | POA: Diagnosis not present

## 2018-02-11 DIAGNOSIS — Z87891 Personal history of nicotine dependence: Secondary | ICD-10-CM | POA: Diagnosis not present

## 2018-02-11 DIAGNOSIS — Z7984 Long term (current) use of oral hypoglycemic drugs: Secondary | ICD-10-CM | POA: Diagnosis not present

## 2018-02-11 DIAGNOSIS — G4700 Insomnia, unspecified: Secondary | ICD-10-CM | POA: Diagnosis not present

## 2018-02-11 DIAGNOSIS — I129 Hypertensive chronic kidney disease with stage 1 through stage 4 chronic kidney disease, or unspecified chronic kidney disease: Secondary | ICD-10-CM | POA: Diagnosis not present

## 2018-02-14 DIAGNOSIS — H9193 Unspecified hearing loss, bilateral: Secondary | ICD-10-CM | POA: Diagnosis not present

## 2018-02-14 DIAGNOSIS — N182 Chronic kidney disease, stage 2 (mild): Secondary | ICD-10-CM | POA: Diagnosis not present

## 2018-02-14 DIAGNOSIS — Z87891 Personal history of nicotine dependence: Secondary | ICD-10-CM | POA: Diagnosis not present

## 2018-02-14 DIAGNOSIS — E1122 Type 2 diabetes mellitus with diabetic chronic kidney disease: Secondary | ICD-10-CM | POA: Diagnosis not present

## 2018-02-14 DIAGNOSIS — G20 Parkinson's disease: Secondary | ICD-10-CM | POA: Diagnosis not present

## 2018-02-14 DIAGNOSIS — Z7984 Long term (current) use of oral hypoglycemic drugs: Secondary | ICD-10-CM | POA: Diagnosis not present

## 2018-02-14 DIAGNOSIS — J189 Pneumonia, unspecified organism: Secondary | ICD-10-CM | POA: Diagnosis not present

## 2018-02-14 DIAGNOSIS — Z8673 Personal history of transient ischemic attack (TIA), and cerebral infarction without residual deficits: Secondary | ICD-10-CM | POA: Diagnosis not present

## 2018-02-14 DIAGNOSIS — E1143 Type 2 diabetes mellitus with diabetic autonomic (poly)neuropathy: Secondary | ICD-10-CM | POA: Diagnosis not present

## 2018-02-14 DIAGNOSIS — M5033 Other cervical disc degeneration, cervicothoracic region: Secondary | ICD-10-CM | POA: Diagnosis not present

## 2018-02-14 DIAGNOSIS — E785 Hyperlipidemia, unspecified: Secondary | ICD-10-CM | POA: Diagnosis not present

## 2018-02-14 DIAGNOSIS — G4700 Insomnia, unspecified: Secondary | ICD-10-CM | POA: Diagnosis not present

## 2018-02-14 DIAGNOSIS — G232 Striatonigral degeneration: Secondary | ICD-10-CM | POA: Diagnosis not present

## 2018-02-14 DIAGNOSIS — G2581 Restless legs syndrome: Secondary | ICD-10-CM | POA: Diagnosis not present

## 2018-02-14 DIAGNOSIS — R1319 Other dysphagia: Secondary | ICD-10-CM | POA: Diagnosis not present

## 2018-02-14 DIAGNOSIS — M810 Age-related osteoporosis without current pathological fracture: Secondary | ICD-10-CM | POA: Diagnosis not present

## 2018-02-14 DIAGNOSIS — G214 Vascular parkinsonism: Secondary | ICD-10-CM | POA: Diagnosis not present

## 2018-02-14 DIAGNOSIS — I129 Hypertensive chronic kidney disease with stage 1 through stage 4 chronic kidney disease, or unspecified chronic kidney disease: Secondary | ICD-10-CM | POA: Diagnosis not present

## 2018-02-17 DIAGNOSIS — G2581 Restless legs syndrome: Secondary | ICD-10-CM | POA: Diagnosis not present

## 2018-02-17 DIAGNOSIS — E785 Hyperlipidemia, unspecified: Secondary | ICD-10-CM | POA: Diagnosis not present

## 2018-02-17 DIAGNOSIS — M810 Age-related osteoporosis without current pathological fracture: Secondary | ICD-10-CM | POA: Diagnosis not present

## 2018-02-17 DIAGNOSIS — I129 Hypertensive chronic kidney disease with stage 1 through stage 4 chronic kidney disease, or unspecified chronic kidney disease: Secondary | ICD-10-CM | POA: Diagnosis not present

## 2018-02-17 DIAGNOSIS — E1122 Type 2 diabetes mellitus with diabetic chronic kidney disease: Secondary | ICD-10-CM | POA: Diagnosis not present

## 2018-02-17 DIAGNOSIS — Z7984 Long term (current) use of oral hypoglycemic drugs: Secondary | ICD-10-CM | POA: Diagnosis not present

## 2018-02-17 DIAGNOSIS — E1143 Type 2 diabetes mellitus with diabetic autonomic (poly)neuropathy: Secondary | ICD-10-CM | POA: Diagnosis not present

## 2018-02-17 DIAGNOSIS — N182 Chronic kidney disease, stage 2 (mild): Secondary | ICD-10-CM | POA: Diagnosis not present

## 2018-02-17 DIAGNOSIS — R1319 Other dysphagia: Secondary | ICD-10-CM | POA: Diagnosis not present

## 2018-02-17 DIAGNOSIS — J189 Pneumonia, unspecified organism: Secondary | ICD-10-CM | POA: Diagnosis not present

## 2018-02-17 DIAGNOSIS — G214 Vascular parkinsonism: Secondary | ICD-10-CM | POA: Diagnosis not present

## 2018-02-17 DIAGNOSIS — Z8673 Personal history of transient ischemic attack (TIA), and cerebral infarction without residual deficits: Secondary | ICD-10-CM | POA: Diagnosis not present

## 2018-02-17 DIAGNOSIS — G232 Striatonigral degeneration: Secondary | ICD-10-CM | POA: Diagnosis not present

## 2018-02-17 DIAGNOSIS — H9193 Unspecified hearing loss, bilateral: Secondary | ICD-10-CM | POA: Diagnosis not present

## 2018-02-17 DIAGNOSIS — M5033 Other cervical disc degeneration, cervicothoracic region: Secondary | ICD-10-CM | POA: Diagnosis not present

## 2018-02-17 DIAGNOSIS — G20 Parkinson's disease: Secondary | ICD-10-CM | POA: Diagnosis not present

## 2018-02-17 DIAGNOSIS — G4700 Insomnia, unspecified: Secondary | ICD-10-CM | POA: Diagnosis not present

## 2018-02-17 DIAGNOSIS — Z87891 Personal history of nicotine dependence: Secondary | ICD-10-CM | POA: Diagnosis not present

## 2018-02-18 DIAGNOSIS — G4700 Insomnia, unspecified: Secondary | ICD-10-CM | POA: Diagnosis not present

## 2018-02-18 DIAGNOSIS — J189 Pneumonia, unspecified organism: Secondary | ICD-10-CM | POA: Diagnosis not present

## 2018-02-18 DIAGNOSIS — I129 Hypertensive chronic kidney disease with stage 1 through stage 4 chronic kidney disease, or unspecified chronic kidney disease: Secondary | ICD-10-CM | POA: Diagnosis not present

## 2018-02-18 DIAGNOSIS — G20 Parkinson's disease: Secondary | ICD-10-CM | POA: Diagnosis not present

## 2018-02-18 DIAGNOSIS — M810 Age-related osteoporosis without current pathological fracture: Secondary | ICD-10-CM | POA: Diagnosis not present

## 2018-02-18 DIAGNOSIS — G2581 Restless legs syndrome: Secondary | ICD-10-CM | POA: Diagnosis not present

## 2018-02-18 DIAGNOSIS — Z8673 Personal history of transient ischemic attack (TIA), and cerebral infarction without residual deficits: Secondary | ICD-10-CM | POA: Diagnosis not present

## 2018-02-18 DIAGNOSIS — E1143 Type 2 diabetes mellitus with diabetic autonomic (poly)neuropathy: Secondary | ICD-10-CM | POA: Diagnosis not present

## 2018-02-18 DIAGNOSIS — Z87891 Personal history of nicotine dependence: Secondary | ICD-10-CM | POA: Diagnosis not present

## 2018-02-18 DIAGNOSIS — E1122 Type 2 diabetes mellitus with diabetic chronic kidney disease: Secondary | ICD-10-CM | POA: Diagnosis not present

## 2018-02-18 DIAGNOSIS — M5033 Other cervical disc degeneration, cervicothoracic region: Secondary | ICD-10-CM | POA: Diagnosis not present

## 2018-02-18 DIAGNOSIS — Z7984 Long term (current) use of oral hypoglycemic drugs: Secondary | ICD-10-CM | POA: Diagnosis not present

## 2018-02-18 DIAGNOSIS — G232 Striatonigral degeneration: Secondary | ICD-10-CM | POA: Diagnosis not present

## 2018-02-18 DIAGNOSIS — N182 Chronic kidney disease, stage 2 (mild): Secondary | ICD-10-CM | POA: Diagnosis not present

## 2018-02-18 DIAGNOSIS — H9193 Unspecified hearing loss, bilateral: Secondary | ICD-10-CM | POA: Diagnosis not present

## 2018-02-18 DIAGNOSIS — E785 Hyperlipidemia, unspecified: Secondary | ICD-10-CM | POA: Diagnosis not present

## 2018-02-18 DIAGNOSIS — G214 Vascular parkinsonism: Secondary | ICD-10-CM | POA: Diagnosis not present

## 2018-02-18 DIAGNOSIS — R1319 Other dysphagia: Secondary | ICD-10-CM | POA: Diagnosis not present

## 2018-02-21 DIAGNOSIS — I129 Hypertensive chronic kidney disease with stage 1 through stage 4 chronic kidney disease, or unspecified chronic kidney disease: Secondary | ICD-10-CM | POA: Diagnosis not present

## 2018-02-21 DIAGNOSIS — J189 Pneumonia, unspecified organism: Secondary | ICD-10-CM | POA: Diagnosis not present

## 2018-02-21 DIAGNOSIS — G20 Parkinson's disease: Secondary | ICD-10-CM | POA: Diagnosis not present

## 2018-02-21 DIAGNOSIS — M810 Age-related osteoporosis without current pathological fracture: Secondary | ICD-10-CM | POA: Diagnosis not present

## 2018-02-21 DIAGNOSIS — Z8673 Personal history of transient ischemic attack (TIA), and cerebral infarction without residual deficits: Secondary | ICD-10-CM | POA: Diagnosis not present

## 2018-02-21 DIAGNOSIS — G4700 Insomnia, unspecified: Secondary | ICD-10-CM | POA: Diagnosis not present

## 2018-02-21 DIAGNOSIS — G232 Striatonigral degeneration: Secondary | ICD-10-CM | POA: Diagnosis not present

## 2018-02-21 DIAGNOSIS — N182 Chronic kidney disease, stage 2 (mild): Secondary | ICD-10-CM | POA: Diagnosis not present

## 2018-02-21 DIAGNOSIS — E1143 Type 2 diabetes mellitus with diabetic autonomic (poly)neuropathy: Secondary | ICD-10-CM | POA: Diagnosis not present

## 2018-02-21 DIAGNOSIS — H9193 Unspecified hearing loss, bilateral: Secondary | ICD-10-CM | POA: Diagnosis not present

## 2018-02-21 DIAGNOSIS — E1122 Type 2 diabetes mellitus with diabetic chronic kidney disease: Secondary | ICD-10-CM | POA: Diagnosis not present

## 2018-02-21 DIAGNOSIS — G2581 Restless legs syndrome: Secondary | ICD-10-CM | POA: Diagnosis not present

## 2018-02-21 DIAGNOSIS — Z7984 Long term (current) use of oral hypoglycemic drugs: Secondary | ICD-10-CM | POA: Diagnosis not present

## 2018-02-21 DIAGNOSIS — E785 Hyperlipidemia, unspecified: Secondary | ICD-10-CM | POA: Diagnosis not present

## 2018-02-21 DIAGNOSIS — M5033 Other cervical disc degeneration, cervicothoracic region: Secondary | ICD-10-CM | POA: Diagnosis not present

## 2018-02-21 DIAGNOSIS — G214 Vascular parkinsonism: Secondary | ICD-10-CM | POA: Diagnosis not present

## 2018-02-21 DIAGNOSIS — Z87891 Personal history of nicotine dependence: Secondary | ICD-10-CM | POA: Diagnosis not present

## 2018-02-21 DIAGNOSIS — R1319 Other dysphagia: Secondary | ICD-10-CM | POA: Diagnosis not present

## 2018-02-24 DIAGNOSIS — G20 Parkinson's disease: Secondary | ICD-10-CM | POA: Diagnosis not present

## 2018-02-24 DIAGNOSIS — R1319 Other dysphagia: Secondary | ICD-10-CM | POA: Diagnosis not present

## 2018-02-24 DIAGNOSIS — E1143 Type 2 diabetes mellitus with diabetic autonomic (poly)neuropathy: Secondary | ICD-10-CM | POA: Diagnosis not present

## 2018-02-24 DIAGNOSIS — I129 Hypertensive chronic kidney disease with stage 1 through stage 4 chronic kidney disease, or unspecified chronic kidney disease: Secondary | ICD-10-CM | POA: Diagnosis not present

## 2018-02-24 DIAGNOSIS — J189 Pneumonia, unspecified organism: Secondary | ICD-10-CM | POA: Diagnosis not present

## 2018-02-24 DIAGNOSIS — H9193 Unspecified hearing loss, bilateral: Secondary | ICD-10-CM | POA: Diagnosis not present

## 2018-02-24 DIAGNOSIS — R1312 Dysphagia, oropharyngeal phase: Secondary | ICD-10-CM | POA: Diagnosis not present

## 2018-02-24 DIAGNOSIS — G232 Striatonigral degeneration: Secondary | ICD-10-CM | POA: Diagnosis not present

## 2018-02-24 DIAGNOSIS — G2581 Restless legs syndrome: Secondary | ICD-10-CM | POA: Diagnosis not present

## 2018-02-24 DIAGNOSIS — G4700 Insomnia, unspecified: Secondary | ICD-10-CM | POA: Diagnosis not present

## 2018-02-24 DIAGNOSIS — M810 Age-related osteoporosis without current pathological fracture: Secondary | ICD-10-CM | POA: Diagnosis not present

## 2018-02-24 DIAGNOSIS — N182 Chronic kidney disease, stage 2 (mild): Secondary | ICD-10-CM | POA: Diagnosis not present

## 2018-02-24 DIAGNOSIS — Z8673 Personal history of transient ischemic attack (TIA), and cerebral infarction without residual deficits: Secondary | ICD-10-CM | POA: Diagnosis not present

## 2018-02-24 DIAGNOSIS — E1122 Type 2 diabetes mellitus with diabetic chronic kidney disease: Secondary | ICD-10-CM | POA: Diagnosis not present

## 2018-02-24 DIAGNOSIS — E785 Hyperlipidemia, unspecified: Secondary | ICD-10-CM | POA: Diagnosis not present

## 2018-02-24 DIAGNOSIS — G214 Vascular parkinsonism: Secondary | ICD-10-CM | POA: Diagnosis not present

## 2018-02-24 DIAGNOSIS — Z87891 Personal history of nicotine dependence: Secondary | ICD-10-CM | POA: Diagnosis not present

## 2018-02-24 DIAGNOSIS — M5033 Other cervical disc degeneration, cervicothoracic region: Secondary | ICD-10-CM | POA: Diagnosis not present

## 2018-02-24 DIAGNOSIS — Z7984 Long term (current) use of oral hypoglycemic drugs: Secondary | ICD-10-CM | POA: Diagnosis not present

## 2018-02-25 DIAGNOSIS — I129 Hypertensive chronic kidney disease with stage 1 through stage 4 chronic kidney disease, or unspecified chronic kidney disease: Secondary | ICD-10-CM | POA: Diagnosis not present

## 2018-02-25 DIAGNOSIS — R1319 Other dysphagia: Secondary | ICD-10-CM | POA: Diagnosis not present

## 2018-02-25 DIAGNOSIS — J189 Pneumonia, unspecified organism: Secondary | ICD-10-CM | POA: Diagnosis not present

## 2018-02-25 DIAGNOSIS — G232 Striatonigral degeneration: Secondary | ICD-10-CM | POA: Diagnosis not present

## 2018-02-25 DIAGNOSIS — G4700 Insomnia, unspecified: Secondary | ICD-10-CM | POA: Diagnosis not present

## 2018-02-25 DIAGNOSIS — G20 Parkinson's disease: Secondary | ICD-10-CM | POA: Diagnosis not present

## 2018-02-25 DIAGNOSIS — H9193 Unspecified hearing loss, bilateral: Secondary | ICD-10-CM | POA: Diagnosis not present

## 2018-02-25 DIAGNOSIS — G214 Vascular parkinsonism: Secondary | ICD-10-CM | POA: Diagnosis not present

## 2018-02-25 DIAGNOSIS — Z7984 Long term (current) use of oral hypoglycemic drugs: Secondary | ICD-10-CM | POA: Diagnosis not present

## 2018-02-25 DIAGNOSIS — E1122 Type 2 diabetes mellitus with diabetic chronic kidney disease: Secondary | ICD-10-CM | POA: Diagnosis not present

## 2018-02-25 DIAGNOSIS — E1143 Type 2 diabetes mellitus with diabetic autonomic (poly)neuropathy: Secondary | ICD-10-CM | POA: Diagnosis not present

## 2018-02-25 DIAGNOSIS — M5033 Other cervical disc degeneration, cervicothoracic region: Secondary | ICD-10-CM | POA: Diagnosis not present

## 2018-02-25 DIAGNOSIS — N182 Chronic kidney disease, stage 2 (mild): Secondary | ICD-10-CM | POA: Diagnosis not present

## 2018-02-25 DIAGNOSIS — G2581 Restless legs syndrome: Secondary | ICD-10-CM | POA: Diagnosis not present

## 2018-02-25 DIAGNOSIS — Z87891 Personal history of nicotine dependence: Secondary | ICD-10-CM | POA: Diagnosis not present

## 2018-02-25 DIAGNOSIS — M810 Age-related osteoporosis without current pathological fracture: Secondary | ICD-10-CM | POA: Diagnosis not present

## 2018-02-25 DIAGNOSIS — E785 Hyperlipidemia, unspecified: Secondary | ICD-10-CM | POA: Diagnosis not present

## 2018-02-25 DIAGNOSIS — Z8673 Personal history of transient ischemic attack (TIA), and cerebral infarction without residual deficits: Secondary | ICD-10-CM | POA: Diagnosis not present

## 2018-02-28 ENCOUNTER — Encounter: Payer: Self-pay | Admitting: Internal Medicine

## 2018-02-28 ENCOUNTER — Telehealth: Payer: Self-pay | Admitting: *Deleted

## 2018-02-28 ENCOUNTER — Ambulatory Visit (INDEPENDENT_AMBULATORY_CARE_PROVIDER_SITE_OTHER): Payer: Medicare Other | Admitting: Internal Medicine

## 2018-02-28 ENCOUNTER — Other Ambulatory Visit: Payer: Self-pay

## 2018-02-28 VITALS — BP 130/90 | HR 77 | Temp 97.5°F | Ht 73.0 in | Wt 139.8 lb

## 2018-02-28 DIAGNOSIS — R1319 Other dysphagia: Secondary | ICD-10-CM | POA: Diagnosis not present

## 2018-02-28 DIAGNOSIS — E1143 Type 2 diabetes mellitus with diabetic autonomic (poly)neuropathy: Secondary | ICD-10-CM

## 2018-02-28 DIAGNOSIS — G214 Vascular parkinsonism: Secondary | ICD-10-CM

## 2018-02-28 DIAGNOSIS — E43 Unspecified severe protein-calorie malnutrition: Secondary | ICD-10-CM

## 2018-02-28 DIAGNOSIS — F332 Major depressive disorder, recurrent severe without psychotic features: Secondary | ICD-10-CM

## 2018-02-28 DIAGNOSIS — J690 Pneumonitis due to inhalation of food and vomit: Secondary | ICD-10-CM | POA: Diagnosis not present

## 2018-02-28 DIAGNOSIS — R269 Unspecified abnormalities of gait and mobility: Secondary | ICD-10-CM

## 2018-02-28 DIAGNOSIS — Z7189 Other specified counseling: Secondary | ICD-10-CM

## 2018-02-28 DIAGNOSIS — R636 Underweight: Secondary | ICD-10-CM

## 2018-02-28 DIAGNOSIS — Z681 Body mass index (BMI) 19 or less, adult: Secondary | ICD-10-CM

## 2018-02-28 MED ORDER — GABAPENTIN 300 MG PO CAPS: 300.0000 mg | ORAL_CAPSULE | Freq: Every day | ORAL | 0 refills | Status: DC

## 2018-02-28 MED ORDER — AMOXICILLIN-POT CLAVULANATE 875-125 MG PO TABS: 1.0000 | ORAL_TABLET | Freq: Two times a day (BID) | ORAL | 0 refills | Status: DC

## 2018-02-28 MED ORDER — DICLOFENAC SODIUM 1 % TD GEL: 4.0000 g | Freq: Four times a day (QID) | TRANSDERMAL | 5 refills | Status: DC

## 2018-02-28 MED ORDER — PRAVASTATIN SODIUM 20 MG PO TABS: ORAL_TABLET | ORAL | 3 refills | Status: AC

## 2018-02-28 MED ORDER — ASPIRIN EC 81 MG PO TBEC: 81.0000 mg | DELAYED_RELEASE_TABLET | Freq: Every day | ORAL | 2 refills | Status: AC

## 2018-02-28 MED ORDER — MUPIROCIN 2 % EX OINT: 1.0000 "application " | TOPICAL_OINTMENT | Freq: Two times a day (BID) | CUTANEOUS | 1 refills | Status: DC

## 2018-02-28 MED ORDER — CALCIUM CARBONATE-VITAMIN D 600-125 MG-UNIT PO TABS: 1.0000 | ORAL_TABLET | Freq: Two times a day (BID) | ORAL | 1 refills | Status: AC

## 2018-02-28 MED ORDER — GLUCOSE BLOOD VI STRP: 1.0000 | ORAL_STRIP | Freq: Three times a day (TID) | 11 refills | Status: AC

## 2018-02-28 NOTE — Telephone Encounter (Signed)
Erin with [facility] called for verbal orders for Speech Therapy 1x4wks for dysphagia and voice.  Verbal orders given.

## 2018-02-28 NOTE — Progress Notes (Signed)
Location:  [facility] clinic Provider:  Chalese Peach L. Mariea Clonts, D.O., C.M.D.  Code Status: DNR, pending MOST completion (discussion held and family discussing)  Goals of Care:  Advanced Directives 02/28/2018  Does Patient Have a Medical Advance Directive? Yes  Type of Advance Directive Living will  Does patient want to make changes to medical advance directive? No - Patient declined  Copy of [place] in Chart? -   Chief Complaint  Patient presents with  . Medical Management of Chronic Issues    routine follow up visit,Patient daughter Jenny Reichmann is with him as well as his wife   HPI: Patient is a 85 y.o. male seen today for medical management of chronic diseases.   He has parkinsons with dementia and dysphagia.  He has begun aspirating over the past year and a half.  His speech is softer and he is requiring a lift for transfers.  He's been hospitalized 3 times for aspiration pneumonia since his last appt with me.   He is coughing and bring up green sputum at times.  His daughter wonders what things we can do to catch him early with these episodes of pneumonia.  She wonders about a chronic low dose antibiotic.    He is on a regular diet.  He does not like to eat many things.  He's very picky.  He used to weight 225 lbs.  He's 139 today and was 147 11/1.    They have both said they want to stay in their home as long as possible.  His wife has gotten some assistance during the week a couple of days so she can get a break and he's getting home health therapy at this time twice a week as well.    Pt provides limited responses--does stay awake and pay attention and will nod or make brief remarks, but typically yields to his wife.    Past Medical History:  Diagnosis Date  . Abnormality of gait   . Arthritis   . Bifascicular block   . Cyanocobalamin deficiency   . Depression   . Diverticulitis   . Dizzy   . Dysphagia, idiopathic    History of aspiration  . Gait disturbance   .  Hearing loss of both ears   . Hyperlipidemia   . Hypertension   . Lumbar back pain   . Memory loss   . Neck pain   . Osteoarthritis   . Osteoporosis   . Other and unspecified hyperlipidemia   . Parkinsonism ([place])   . Recurrent falls    Using a cane and walker  . Restless leg   . Restless legs syndrome (RLS)   . Right knee pain   . Shoulder pain   . Thoracic back pain   . Thyroid disease   . Type 2 diabetes mellitus with autonomic neuropathy ([place])   . Unspecified essential hypertension   . Unspecified hypothyroidism   . Vitamin D deficiency     Past Surgical History:  Procedure Laterality Date  . CATARACT EXTRACTION Bilateral 2012   Dr. Bing Plume    Allergies  Allergen Reactions  . Caffeine Swelling and Other (See Comments)    Reaction:  Joint swelling    Outpatient Encounter Medications as of 02/28/2018  Medication Sig  . acetaminophen (TYLENOL) 500 MG tablet Take 1,000 mg by mouth daily as needed for mild pain.  Marland Kitchen aspirin EC 81 MG tablet Take 81 mg by mouth daily.  . benazepril (LOTENSIN) 5 MG tablet Take  1 tablet (5 mg total) by mouth at bedtime.  . Calcium Carbonate-Vitamin D (CALCIUM-CARB 600 + D) 600-125 MG-UNIT TABS Take 1 tablet by mouth 2 (two) times daily.   . Calcium Polycarbophil (FIBERCON PO) Take 4 tablets by mouth at bedtime.  . carbidopa-levodopa (SINEMET IR) 25-100 MG tablet Take 3 tablets by mouth 2 (two) times daily. (Patient taking differently: Take 3 tablets by mouth See admin instructions. Take 3 tablets by mouth daily at noon and at 4pm)  . cyanocobalamin (,VITAMIN B-12,) 1000 MCG/ML injection INJECT 1ML INTO THE MUSCLE EVERY 30 DAYS. (ON THE 15TH)  . diclofenac sodium (VOLTAREN) 1 % GEL Apply 4 g topically 4 (four) times daily. To affected knee  . gabapentin (NEURONTIN) 300 MG capsule TAKE 1 CAPSULE BY MOUTH AT  BEDTIME (Patient taking differently: Take 300 mg by mouth at bedtime. )  . glucose blood (ONE TOUCH ULTRA TEST) test strip 1 each by Other  route 3 (three) times daily. Dx: E11.43  . metFORMIN (GLUCOPHAGE) 1000 MG tablet TAKE 1 TABLET BY MOUTH TWO  TIMES DAILY WITH MEALS (Patient taking differently: Take 1,000 mg by mouth 2 (two) times daily with a meal. )  . mirtazapine (REMERON) 30 MG tablet TAKE 1 TABLET BY MOUTH AT  BEDTIME (Patient taking differently: Take 30 mg by mouth at bedtime. )  . mupirocin ointment (BACTROBAN) 2 % Apply 1 application topically 2 (two) times daily.  Glory Rosebush DELICA LANCETS FINE MISC Use to check blood glucose three times daily. Dx: E11.43  . pantoprazole (PROTONIX) 40 MG tablet TAKE 1 TABLET BY MOUTH TWO  TIMES DAILY (Patient taking differently: Take 40 mg by mouth 2 (two) times daily. )  . Polyethyl Glycol-Propyl Glycol (SYSTANE OP) Place 1 drop into both eyes 2 (two) times daily.  . pravastatin (PRAVACHOL) 20 MG tablet TAKE 1 TABLET BY MOUTH AT  BEDTIME (Patient taking differently: Take 20 mg by mouth at bedtime. )  . pyridOXINE (VITAMIN B-6) 100 MG tablet Take 100 mg by mouth daily.  . sodium phosphate (FLEET) 7-19 GM/118ML ENEM Place 1 enema rectally once a week.  . tamsulosin (FLOMAX) 0.4 MG CAPS capsule TAKE 1 CAPSULE BY MOUTH AT  BEDTIME (Patient taking differently: Take 0.4 mg by mouth at bedtime. )  . [DISCONTINUED] amoxicillin-clavulanate (AUGMENTIN) 875-125 MG tablet Take 1 tablet by mouth 2 (two) times daily. One po bid x 7 days   No facility-administered encounter medications on file as of 02/28/2018.     Review of Systems:  Review of Systems  Constitutional: Positive for malaise/fatigue and weight loss. Negative for chills, diaphoresis and fever.  HENT: Negative for congestion.   Eyes:       Glasses  Respiratory: Positive for cough and sputum production. Negative for shortness of breath and wheezing.   Cardiovascular: Negative for chest pain, palpitations and leg swelling.  Gastrointestinal: Positive for constipation. Negative for abdominal pain and diarrhea.       Dysphagia; takes  weekly enema for constipation if needed  Genitourinary: Negative for dysuria.  Musculoskeletal: Positive for falls and joint pain. Negative for back pain.  Skin: Negative for itching and rash.  Neurological: Positive for weakness. Negative for dizziness, loss of consciousness and headaches.       Rigidity, more lower extremity involvement of Parkinsons  Endo/Heme/Allergies: Does not bruise/bleed easily.  Psychiatric/Behavioral: Positive for memory loss. Negative for depression and suicidal ideas. The patient is not nervous/anxious and does not have insomnia.     Health Maintenance  Topic Date Due  . COLONOSCOPY  11/23/2004  . FOOT EXAM  10/22/2017  . HEMOGLOBIN A1C  05/03/2018  . OPHTHALMOLOGY EXAM  10/01/2018  . TETANUS/TDAP  05/28/2027  . INFLUENZA VACCINE  Completed  . PNA vac Low Risk Adult  Completed    Physical Exam: Vitals:   02/28/18 1010  BP: 130/90  Pulse: 77  Temp: (!) 97.5 F (36.4 C)  TempSrc: Oral  SpO2: 96%  Weight: 139 lb 12.8 oz (63.4 kg)  Height: '6\' 1"'  (1.854 m)   Body mass index is 18.44 kg/m. Physical Exam Constitutional:      General: He is not in acute distress.    Appearance: He is ill-appearing. He is not toxic-appearing.     Comments: underweight  HENT:     Head: Normocephalic and atraumatic.     Nose: Rhinorrhea present. No congestion.     Mouth/Throat:     Mouth: Mucous membranes are moist.     Pharynx: Oropharynx is clear.  Cardiovascular:     Rate and Rhythm: Normal rate and regular rhythm.     Pulses: Normal pulses.     Heart sounds: Normal heart sounds.  Pulmonary:     Effort: Pulmonary effort is normal.     Breath sounds: Rhonchi present.  Abdominal:     General: Abdomen is flat. Bowel sounds are normal. There is no distension.     Tenderness: There is no abdominal tenderness. There is no guarding or rebound.  Musculoskeletal: Normal range of motion.  Lymphadenopathy:     Cervical: No cervical adenopathy.  Skin:    Capillary  Refill: Capillary refill takes less than 2 seconds.     Coloration: Skin is pale.  Neurological:     Mental Status: He is alert.     Motor: Weakness present.     Gait: Gait abnormal.     Comments: Cogwheel rigidity, uses manual wheelchair for mobility pushed by his wife  Psychiatric:     Comments: Flat affect/masked facies     Labs reviewed: Basic Metabolic Panel: Recent Labs    11/21/17 2020  01/03/18 2101 01/04/18 0814 02/01/18 1834  NA  --    < > 140 143 142  K  --    < > 3.7 3.9 3.6  CL  --    < > 102 108 102  CO2  --    < > '29 30 28  ' GLUCOSE  --    < > 103* 93 155*  BUN  --    < > 24* 20 20  CREATININE  --    < > 0.98 0.91 1.02  CALCIUM  --    < > 9.8 8.5* 9.6  TSH 0.990  --   --   --   --    < > = values in this interval not displayed.   Liver Function Tests: Recent Labs    11/21/17 1648 01/03/18 2101 02/01/18 1834  AST '27 18 24  ' ALT 15 <5 9  ALKPHOS 63 57 61  BILITOT 1.1 0.8 0.6  PROT 7.1 6.9 6.7  ALBUMIN 3.9 3.9 3.7   No results for input(s): LIPASE, AMYLASE in the last 8760 hours. No results for input(s): AMMONIA in the last 8760 hours. CBC: Recent Labs    11/21/17 1648  01/03/18 2101 01/04/18 0814 02/01/18 1834  WBC 16.5*   < > 13.1* 6.7 12.9*  NEUTROABS 15.1*  --  11.1*  --  10.7*  HGB 16.0   < >  14.0 12.0* 14.6  HCT 48.0   < > 42.5 37.8* 45.9  MCV 95.0   < > 99.5 101.3* 98.7  PLT 281   < > 250 211 251   < > = values in this interval not displayed.   Lipid Panel: No results for input(s): CHOL, HDL, LDLCALC, TRIG, CHOLHDL, LDLDIRECT in the last 8760 hours. Lab Results  Component Value Date   HGBA1C 5.7 (H) 11/02/2017    Procedures since last visit: Dg Chest 2 View  Result Date: 02/01/2018 CLINICAL DATA:  Short of breath EXAM: CHEST - 2 VIEW COMPARISON:  01/03/2018 FINDINGS: The heart size and mediastinal contours are within normal limits. Both lungs are clear. The visualized skeletal structures are unremarkable. IMPRESSION: No active  cardiopulmonary disease. Electronically Signed   By: Franchot Gallo M.D.   On: 02/01/2018 18:18   Ct Angio Chest Pe W And/or Wo Contrast  Result Date: 02/01/2018 CLINICAL DATA:  Shortness of breath and cough for several days EXAM: CT ANGIOGRAPHY CHEST WITH CONTRAST TECHNIQUE: Multidetector CT imaging of the chest was performed using the standard protocol during bolus administration of intravenous contrast. Multiplanar CT image reconstructions and MIPs were obtained to evaluate the vascular anatomy. CONTRAST:  112m ISOVUE-370 IOPAMIDOL (ISOVUE-370) INJECTION 76% COMPARISON:  Chest x-ray from earlier in the same day. FINDINGS: Cardiovascular: Thoracic aorta demonstrates atherosclerotic calcification without aneurysmal dilatation. Opacification is limited and dissection evaluation is not able to be performed. No cardiac enlargement is seen. The pulmonary artery demonstrates a normal branching pattern. No central pulmonary embolus is seen. The smaller peripheral branches are somewhat difficult to evaluate due to the timing of the contrast bolus as well as patient motion artifact. Coronary calcifications are noted. Mediastinum/Nodes: The thoracic inlet is within normal limits. No hilar or mediastinal adenopathy is noted. The esophagus as visualized is within normal limits. Lungs/Pleura: Lungs are well aerated bilaterally. No focal infiltrate or sizable effusion is seen. No sizable parenchymal nodule is noted. Upper Abdomen: Visualized upper abdomen is within normal limits. Musculoskeletal: Degenerative changes of the thoracic spine are noted. No acute bony abnormality is seen. Review of the MIP images confirms the above findings. IMPRESSION: No definitive pulmonary emboli identified although evaluation is slightly limited by patient motion artifact. No acute abnormality noted. Aortic Atherosclerosis (ICD10-I70.0). Electronically Signed   By: MInez CatalinaM.D.   On: 02/01/2018 20:55    Assessment/Plan 1.  Aspiration pneumonia due to gastric secretions, unspecified laterality, unspecified part of lung (HCC) -recurrent, has abnormal lung sounds and discolored sputum again now so treating with augmentin--counseled that empiric abx ongoing is not indicated and would cause other problems over time like resistance, diarrhea/altered bowel flora - Ambulatory referral to Hospice--educated pt, wife BJacqlyn Larsenand daughter CJenny Reichmannthat this will be an ongoing problem for him, is likely a contributor to his weight loss along with general progression of his dementia so we need to reestablish his goals of care--they would like to avoid hospitalizations and pt has already opted on his own that he would not want to be resuscitated if CPR or intubation/mechanical ventilation was required; he has a living will in place not to prolong his life in the event of terminal illness - CBC with Differential/Platelet - amoxicillin-clavulanate (AUGMENTIN) 875-125 MG tablet; Take 1 tablet by mouth 2 (two) times daily.  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 Protein-calorie malnutrition, severe -due to poor intake, dysphagia, dementia, cont supportive care; they've opted to avoid a modified diet b/c his intake diminished further with change in food textures - Ambulatory referral to Hospice - CBC with Differential/Platelet - Basic metabolic panel  4. Neurogenic  dysphagia -from parkinsonism/vascular disease - Ambulatory referral to Hospice  5. Gait difficulty -cont hoyer, wheelchair - Ambulatory referral to Hospice  6. ACP (advance care planning) - as discussed above, I met with pt, his wife and his daughter today and discussed his recurrent hospitalizations with aspiration pneumonia, the chronic status of his dysphagia and his documented wishes as they stand -both pt and his wife desire to stay in their home as long as possible and his wife has used hospice services in the past with other family members -family was open to meeting with hospice and they are reviewing the MOST form and we will meet again 03/14/2018 to complete the form (if it is not already done with the hospice team) - Ambulatory referral to Hospice -60 mins spent on ACP  7. Underweight - due to recurrent aspiration, dysphagia, parkinsonism, dementia; continue supportive care and regular diet as tolerated at pt and family request, aspiration precautions, encourage foods he likes  - Ambulatory referral to Hospice  8. Body mass index (BMI) of 19 or less in adult - as above, encourage po intake of foods he likes - Ambulatory referral to Hospice  9. Severe episode of recurrent major depressive disorder, without psychotic features ([place]) -in remission with ongoing remeron therapy, lower dose to help appetite was not effective  10. Type 2 diabetes mellitus with diabetic autonomic neuropathy, without long-term current use of insulin (HCC) -is on metformin high dose with meals -may reduce this also when we meet again 1/20 with hopes appetite improves and weight loss will decrease  Labs/tests ordered:  Orders Placed This Encounter  Procedures  . CBC with Differential/Platelet  . Basic metabolic panel    Order Specific Question:   Has the patient fasted?    Answer:   Yes  . Ambulatory referral to Hospice    Referral Priority:   Routine    Referral Type:   Consultation    Referral  Reason:   Specialty Services Required    Requested Specialty:   Hospice Services    Number of Visits Requested:   1    Next appt:  03/14/2018   Joniqua Sidle L. Jashan Cotten, D.O. [facility] [address], [place] [address] Cell Phone (Mon-Fri 8am-5pm):  [phone] On Call:  [phone] & follow prompts after 5pm & weekends Office Phone:  [phone] Office Fax:  [phone]

## 2018-03-01 ENCOUNTER — Telehealth: Payer: Self-pay | Admitting: *Deleted

## 2018-03-01 DIAGNOSIS — E1122 Type 2 diabetes mellitus with diabetic chronic kidney disease: Secondary | ICD-10-CM | POA: Diagnosis not present

## 2018-03-01 DIAGNOSIS — J189 Pneumonia, unspecified organism: Secondary | ICD-10-CM | POA: Diagnosis not present

## 2018-03-01 DIAGNOSIS — Z87891 Personal history of nicotine dependence: Secondary | ICD-10-CM | POA: Diagnosis not present

## 2018-03-01 DIAGNOSIS — G20 Parkinson's disease: Secondary | ICD-10-CM | POA: Diagnosis not present

## 2018-03-01 DIAGNOSIS — R1319 Other dysphagia: Secondary | ICD-10-CM | POA: Diagnosis not present

## 2018-03-01 DIAGNOSIS — G232 Striatonigral degeneration: Secondary | ICD-10-CM | POA: Diagnosis not present

## 2018-03-01 DIAGNOSIS — G2581 Restless legs syndrome: Secondary | ICD-10-CM | POA: Diagnosis not present

## 2018-03-01 DIAGNOSIS — G4700 Insomnia, unspecified: Secondary | ICD-10-CM | POA: Diagnosis not present

## 2018-03-01 DIAGNOSIS — M5033 Other cervical disc degeneration, cervicothoracic region: Secondary | ICD-10-CM | POA: Diagnosis not present

## 2018-03-01 DIAGNOSIS — N182 Chronic kidney disease, stage 2 (mild): Secondary | ICD-10-CM | POA: Diagnosis not present

## 2018-03-01 DIAGNOSIS — Z8673 Personal history of transient ischemic attack (TIA), and cerebral infarction without residual deficits: Secondary | ICD-10-CM | POA: Diagnosis not present

## 2018-03-01 DIAGNOSIS — I129 Hypertensive chronic kidney disease with stage 1 through stage 4 chronic kidney disease, or unspecified chronic kidney disease: Secondary | ICD-10-CM | POA: Diagnosis not present

## 2018-03-01 DIAGNOSIS — H9193 Unspecified hearing loss, bilateral: Secondary | ICD-10-CM | POA: Diagnosis not present

## 2018-03-01 DIAGNOSIS — G214 Vascular parkinsonism: Secondary | ICD-10-CM | POA: Diagnosis not present

## 2018-03-01 DIAGNOSIS — E1143 Type 2 diabetes mellitus with diabetic autonomic (poly)neuropathy: Secondary | ICD-10-CM | POA: Diagnosis not present

## 2018-03-01 DIAGNOSIS — M810 Age-related osteoporosis without current pathological fracture: Secondary | ICD-10-CM | POA: Diagnosis not present

## 2018-03-01 DIAGNOSIS — Z7984 Long term (current) use of oral hypoglycemic drugs: Secondary | ICD-10-CM | POA: Diagnosis not present

## 2018-03-01 DIAGNOSIS — E785 Hyperlipidemia, unspecified: Secondary | ICD-10-CM | POA: Diagnosis not present

## 2018-03-01 LAB — CBC WITH DIFFERENTIAL/PLATELET
Absolute Monocytes: 490 cells/uL (ref 200–950)
Basophils Absolute: 41 cells/uL (ref 0–200)
Basophils Relative: 0.6 %
Eosinophils Absolute: 102 cells/uL (ref 15–500)
Eosinophils Relative: 1.5 %
HCT: 42.9 % (ref 38.5–50.0)
Hemoglobin: 14.5 g/dL (ref 13.2–17.1)
Lymphs Abs: 1958 cells/uL (ref 850–3900)
MCH: 31.9 pg (ref 27.0–33.0)
MCHC: 33.8 g/dL (ref 32.0–36.0)
MCV: 94.5 fL (ref 80.0–100.0)
MPV: 10 fL (ref 7.5–12.5)
Monocytes Relative: 7.2 %
Neutro Abs: 4209 cells/uL (ref 1500–7800)
Neutrophils Relative %: 61.9 %
Platelets: 279 10*3/uL (ref 140–400)
RBC: 4.54 10*6/uL (ref 4.20–5.80)
RDW: 12.1 % (ref 11.0–15.0)
Total Lymphocyte: 28.8 %
WBC: 6.8 10*3/uL (ref 3.8–10.8)

## 2018-03-01 LAB — BASIC METABOLIC PANEL
BUN/Creatinine Ratio: 28 (calc) — ABNORMAL HIGH (ref 6–22)
BUN: 28 mg/dL — ABNORMAL HIGH (ref 7–25)
CO2: 32 mmol/L (ref 20–32)
Calcium: 10.2 mg/dL (ref 8.6–10.3)
Chloride: 102 mmol/L (ref 98–110)
Creat: 1 mg/dL (ref 0.70–1.11)
Glucose, Bld: 101 mg/dL — ABNORMAL HIGH (ref 65–99)
Potassium: 4.2 mmol/L (ref 3.5–5.3)
Sodium: 142 mmol/L (ref 135–146)

## 2018-03-01 NOTE — Telephone Encounter (Signed)
Yes, I will be attending.  I didn't fill in the form.  My note is also not done yet.

## 2018-03-01 NOTE — Telephone Encounter (Signed)
Jenn notified and agreed.  

## 2018-03-01 NOTE — Telephone Encounter (Signed)
Jen with [facility] called and stated that they received a referral for patient from Dr. Renato Gails. Stated that the "attending physician" box was marked as well as the "Deferred" box. Candise [place] wants to confirm that Dr. Renato Gails will be the Attending physician. Please Advise.

## 2018-03-03 ENCOUNTER — Ambulatory Visit: Payer: Medicare Other | Admitting: Neurology

## 2018-03-03 DIAGNOSIS — G214 Vascular parkinsonism: Secondary | ICD-10-CM | POA: Diagnosis not present

## 2018-03-03 DIAGNOSIS — J189 Pneumonia, unspecified organism: Secondary | ICD-10-CM | POA: Diagnosis not present

## 2018-03-03 DIAGNOSIS — Z87891 Personal history of nicotine dependence: Secondary | ICD-10-CM | POA: Diagnosis not present

## 2018-03-03 DIAGNOSIS — I129 Hypertensive chronic kidney disease with stage 1 through stage 4 chronic kidney disease, or unspecified chronic kidney disease: Secondary | ICD-10-CM | POA: Diagnosis not present

## 2018-03-03 DIAGNOSIS — G232 Striatonigral degeneration: Secondary | ICD-10-CM | POA: Diagnosis not present

## 2018-03-03 DIAGNOSIS — E1122 Type 2 diabetes mellitus with diabetic chronic kidney disease: Secondary | ICD-10-CM | POA: Diagnosis not present

## 2018-03-03 DIAGNOSIS — M5033 Other cervical disc degeneration, cervicothoracic region: Secondary | ICD-10-CM | POA: Diagnosis not present

## 2018-03-03 DIAGNOSIS — N182 Chronic kidney disease, stage 2 (mild): Secondary | ICD-10-CM | POA: Diagnosis not present

## 2018-03-03 DIAGNOSIS — G2581 Restless legs syndrome: Secondary | ICD-10-CM | POA: Diagnosis not present

## 2018-03-03 DIAGNOSIS — E1143 Type 2 diabetes mellitus with diabetic autonomic (poly)neuropathy: Secondary | ICD-10-CM | POA: Diagnosis not present

## 2018-03-03 DIAGNOSIS — G20 Parkinson's disease: Secondary | ICD-10-CM | POA: Diagnosis not present

## 2018-03-03 DIAGNOSIS — H9193 Unspecified hearing loss, bilateral: Secondary | ICD-10-CM | POA: Diagnosis not present

## 2018-03-03 DIAGNOSIS — Z8673 Personal history of transient ischemic attack (TIA), and cerebral infarction without residual deficits: Secondary | ICD-10-CM | POA: Diagnosis not present

## 2018-03-03 DIAGNOSIS — M810 Age-related osteoporosis without current pathological fracture: Secondary | ICD-10-CM | POA: Diagnosis not present

## 2018-03-03 DIAGNOSIS — E785 Hyperlipidemia, unspecified: Secondary | ICD-10-CM | POA: Diagnosis not present

## 2018-03-03 DIAGNOSIS — G4700 Insomnia, unspecified: Secondary | ICD-10-CM | POA: Diagnosis not present

## 2018-03-03 DIAGNOSIS — Z7984 Long term (current) use of oral hypoglycemic drugs: Secondary | ICD-10-CM | POA: Diagnosis not present

## 2018-03-03 DIAGNOSIS — R1319 Other dysphagia: Secondary | ICD-10-CM | POA: Diagnosis not present

## 2018-03-04 ENCOUNTER — Encounter: Payer: Self-pay | Admitting: *Deleted

## 2018-03-07 ENCOUNTER — Ambulatory Visit: Payer: Medicare Other | Admitting: Internal Medicine

## 2018-03-07 ENCOUNTER — Other Ambulatory Visit: Payer: Self-pay | Admitting: *Deleted

## 2018-03-07 DIAGNOSIS — E785 Hyperlipidemia, unspecified: Secondary | ICD-10-CM | POA: Diagnosis not present

## 2018-03-07 DIAGNOSIS — N182 Chronic kidney disease, stage 2 (mild): Secondary | ICD-10-CM | POA: Diagnosis not present

## 2018-03-07 DIAGNOSIS — G20 Parkinson's disease: Secondary | ICD-10-CM | POA: Diagnosis not present

## 2018-03-07 DIAGNOSIS — G232 Striatonigral degeneration: Secondary | ICD-10-CM | POA: Diagnosis not present

## 2018-03-07 DIAGNOSIS — M5033 Other cervical disc degeneration, cervicothoracic region: Secondary | ICD-10-CM | POA: Diagnosis not present

## 2018-03-07 DIAGNOSIS — Z8673 Personal history of transient ischemic attack (TIA), and cerebral infarction without residual deficits: Secondary | ICD-10-CM | POA: Diagnosis not present

## 2018-03-07 DIAGNOSIS — G4700 Insomnia, unspecified: Secondary | ICD-10-CM | POA: Diagnosis not present

## 2018-03-07 DIAGNOSIS — E1122 Type 2 diabetes mellitus with diabetic chronic kidney disease: Secondary | ICD-10-CM | POA: Diagnosis not present

## 2018-03-07 DIAGNOSIS — Z7984 Long term (current) use of oral hypoglycemic drugs: Secondary | ICD-10-CM | POA: Diagnosis not present

## 2018-03-07 DIAGNOSIS — G2581 Restless legs syndrome: Secondary | ICD-10-CM | POA: Diagnosis not present

## 2018-03-07 DIAGNOSIS — H9193 Unspecified hearing loss, bilateral: Secondary | ICD-10-CM | POA: Diagnosis not present

## 2018-03-07 DIAGNOSIS — I129 Hypertensive chronic kidney disease with stage 1 through stage 4 chronic kidney disease, or unspecified chronic kidney disease: Secondary | ICD-10-CM | POA: Diagnosis not present

## 2018-03-07 DIAGNOSIS — Z87891 Personal history of nicotine dependence: Secondary | ICD-10-CM | POA: Diagnosis not present

## 2018-03-07 DIAGNOSIS — E1143 Type 2 diabetes mellitus with diabetic autonomic (poly)neuropathy: Secondary | ICD-10-CM | POA: Diagnosis not present

## 2018-03-07 DIAGNOSIS — R1319 Other dysphagia: Secondary | ICD-10-CM | POA: Diagnosis not present

## 2018-03-07 DIAGNOSIS — M810 Age-related osteoporosis without current pathological fracture: Secondary | ICD-10-CM | POA: Diagnosis not present

## 2018-03-07 DIAGNOSIS — G214 Vascular parkinsonism: Secondary | ICD-10-CM | POA: Diagnosis not present

## 2018-03-07 DIAGNOSIS — J189 Pneumonia, unspecified organism: Secondary | ICD-10-CM | POA: Diagnosis not present

## 2018-03-07 MED ORDER — MUPIROCIN 2 % EX OINT: 1.0000 "application " | TOPICAL_OINTMENT | Freq: Two times a day (BID) | CUTANEOUS | 1 refills | Status: DC

## 2018-03-07 NOTE — Telephone Encounter (Signed)
[facility]

## 2018-03-08 ENCOUNTER — Encounter: Payer: Self-pay | Admitting: Family

## 2018-03-08 ENCOUNTER — Ambulatory Visit
Admission: RE | Admit: 2018-03-08 | Discharge: 2018-03-08 | Disposition: A | Discharge status: Home / Self Care | Payer: Medicare Other | Source: Ambulatory Visit | Attending: Family | Admitting: Family

## 2018-03-08 ENCOUNTER — Ambulatory Visit (INDEPENDENT_AMBULATORY_CARE_PROVIDER_SITE_OTHER): Payer: Medicare Other | Admitting: Family

## 2018-03-08 VITALS — BP 140/60 | HR 59 | Temp 97.1°F | Ht 73.0 in | Wt 145.4 lb

## 2018-03-08 DIAGNOSIS — R058 Other specified cough: Secondary | ICD-10-CM

## 2018-03-08 DIAGNOSIS — R05 Cough: Secondary | ICD-10-CM | POA: Diagnosis not present

## 2018-03-08 LAB — CBC WITH DIFFERENTIAL/PLATELET
Absolute Monocytes: 630 cells/uL (ref 200–950)
BASOS PCT: 0.5 %
Basophils Absolute: 70 cells/uL (ref 0–200)
EOS PCT: 0.4 %
Eosinophils Absolute: 56 cells/uL (ref 15–500)
HCT: 41.1 % (ref 38.5–50.0)
Hemoglobin: 14 g/dL (ref 13.2–17.1)
Lymphs Abs: 1498 cells/uL (ref 850–3900)
MCH: 31.7 pg (ref 27.0–33.0)
MCHC: 34.1 g/dL (ref 32.0–36.0)
MCV: 93.2 fL (ref 80.0–100.0)
MPV: 10.9 fL (ref 7.5–12.5)
Monocytes Relative: 4.5 %
Neutro Abs: 11746 cells/uL — ABNORMAL HIGH (ref 1500–7800)
Neutrophils Relative %: 83.9 %
Platelets: 295 10*3/uL (ref 140–400)
RBC: 4.41 10*6/uL (ref 4.20–5.80)
RDW: 12.2 % (ref 11.0–15.0)
Total Lymphocyte: 10.7 %
WBC: 14 10*3/uL — AB (ref 3.8–10.8)

## 2018-03-08 NOTE — Patient Instructions (Signed)
1.Complete Augmentin tablets as directed. 2. Lab work drawn today will call with results. 3. Please get chest X-ray done as directed will call you with results.  Cough, Adult  A cough helps to clear your throat and lungs. A cough may last only 2-3 weeks (acute), or it may last longer than 8 weeks (chronic). Many different things can cause a cough. A cough may be a sign of an illness or another medical condition. Follow these instructions at home:  Pay attention to any changes in your cough.  Take medicines only as told by your doctor. ? If you were prescribed an antibiotic medicine, take it as told by your doctor. Do not stop taking it even if you start to feel better. ? Talk with your doctor before you try using a cough medicine.  Drink enough fluid to keep your pee (urine) clear or pale yellow.  If the air is dry, use a cold steam vaporizer or humidifier in your home.  Stay away from things that make you cough at work or at home.  If your cough is worse at night, try using extra pillows to raise your head up higher while you sleep.  Do not smoke, and try not to be around smoke. If you need help quitting, ask your doctor.  Do not have caffeine.  Do not drink alcohol.  Rest as needed. Contact a doctor if:  You have new problems (symptoms).  You cough up yellow fluid (pus).  Your cough does not get better after 2-3 weeks, or your cough gets worse.  Medicine does not help your cough and you are not sleeping well.  You have pain that gets worse or pain that is not helped with medicine.  You have a fever.  You are losing weight and you do not know why.  You have night sweats. Get help right away if:  You cough up blood.  You have trouble breathing.  Your heartbeat is very fast. This information is not intended to replace advice given to you by your health care provider. Make sure you discuss any questions you have with your health care provider. Document Released:  10/23/2010 Document Revised: 07/18/2015 Document Reviewed: 04/18/2014 Elsevier Interactive Patient Education  2019 [employer].

## 2018-03-08 NOTE — Progress Notes (Signed)
Provider: Severa Jeremiah FNP-C  Kermit Baloeed, Tiffany L, DO  Patient Care Team: Kermit Baloeed, Tiffany L, DO as PCP - General (Geriatric Medicine) Lars MassonNelson, Katarina H, MD as PCP - Cardiology (Cardiology) Tat, Octaviano Battyebecca S, DO as Consulting Physician (Neurology) Ranee GosselinGioffre, Ronald, MD as Consulting Physician (Orthopedic Surgery) Nelson Chimesigby, Donald, MD as Consulting Physician (Ophthalmology) Mardella LaymanPatterson, Redell R, MD as Consulting Physician (Gastroenterology)  Extended Emergency Contact Information Primary Emergency Contact: Chandra BatchHodgin,Betty Jo Address: [address] [place] [address]          [place], [place] [address] [place] Home Phone: [phone] Mobile Phone: [phone] Relation: Spouse Secondary Emergency Contact: Shore,Dori Address: [address] [place] [address]          [place], [place] [address] [place] Home Phone: [phone] Mobile Phone: [phone] Relation: Daughter  Goals of care: Advanced Directive information Advanced Directives 03/08/2018  Does Patient Have a Medical Advance Directive? Yes  Type of Advance Directive Living will  Does patient want to make changes to medical advance directive? No - Patient declined  Copy of Healthcare Power of Attorney in Chart? -     Chief Complaint  Patient presents with  . Acute Visit    On amoxicillin since 02/28/18, low grade fever since friday with painful cough and congestion, when breaths deep makes him cough     HPI:  Pt is a 85 y.o. male [facility] for evaluation of worsening cough with deep breathing.He is here escorted by wife who provided most of the information.she reports patient's breathing seems to be worsening despite use of amoxicillin given 02/28/2018 [facility].He cannot take deep breaths due to pain with coughing.she checks his oxygen saturation at home readings has been in the 90's.she states patient was burning up last night and early this morning but unable to check Temperature since patient can not  hold the thermometer in the mouth. No wheezing or shortness of breath reported.He has had poor appetite and tiredness.His CBG readings has been in the 120's per wife no log brought to visit today.     Past Medical History:  Diagnosis Date  . Abnormality of gait   . Arthritis   . Bifascicular block   . Cyanocobalamin deficiency   . Depression   . Diverticulitis   . Dizzy   . Dysphagia, idiopathic    History of aspiration  . Gait disturbance   . Hearing loss of both ears   . Hyperlipidemia   . Hypertension   . Lumbar back pain   . Memory loss   . Neck pain   . Osteoarthritis   . Osteoporosis   . Other and unspecified hyperlipidemia   . Parkinsonism (HCC)   . Recurrent falls    Using a cane and walker  . Restless leg   . Restless legs syndrome (RLS)   . Right knee pain   . Shoulder pain   . Thoracic back pain   . Thyroid disease   . Type 2 diabetes mellitus with autonomic neuropathy (HCC)   . Unspecified essential hypertension   . Unspecified hypothyroidism   . Vitamin D deficiency    Past Surgical History:  Procedure Laterality Date  . CATARACT EXTRACTION Bilateral 2012   Dr. Hazle Quantigby    Allergies  Allergen Reactions  . Caffeine Swelling and Other (See Comments)    Reaction:  Joint swelling    Outpatient Encounter Medications as of 03/08/2018  Medication Sig  . acetaminophen (TYLENOL) 500 MG  tablet Take 1,000 mg by mouth daily as needed for mild pain.  Marland Kitchen amoxicillin-clavulanate (AUGMENTIN) 875-125 MG tablet Take 1 tablet by mouth 2 (two) times daily.  Marland Kitchen aspirin EC 81 MG tablet Take 1 tablet (81 mg total) by mouth daily.  . benazepril (LOTENSIN) 5 MG tablet Take 1 tablet (5 mg total) by mouth at bedtime.  . Calcium Carbonate-Vitamin D (CALCIUM-CARB 600 + D) 600-125 MG-UNIT TABS Take 1 tablet by mouth 2 (two) times daily.  . Calcium Polycarbophil (FIBERCON PO) Take 4 tablets by mouth at bedtime.  . carbidopa-levodopa (SINEMET IR) 25-100 MG tablet Take 3 tablets by  mouth 2 (two) times daily.  . cyanocobalamin (,VITAMIN B-12,) 1000 MCG/ML injection INJECT INTO THE MUSCLE EVERY 30 DAYS. (ON THE 15TH)  . diclofenac sodium (VOLTAREN) 1 % GEL Apply 4 g topically 4 (four) times daily. To affected knee  . gabapentin (NEURONTIN) 300 MG capsule Take 1 capsule (300 mg total) by mouth at bedtime.  Marland Kitchen glucose blood (ONE TOUCH ULTRA TEST) test strip 1 each by Other route 3 (three) times daily. Dx: E11.43  . metFORMIN (GLUCOPHAGE) 1000 MG tablet TAKE 1 TABLET BY MOUTH TWO  TIMES DAILY WITH MEALS  . mirtazapine (REMERON) 30 MG tablet TAKE 1 TABLET BY MOUTH AT  BEDTIME  . mupirocin ointment (BACTROBAN) 2 % Apply 1 application topically 2 (two) times daily.  Letta Pate DELICA LANCETS FINE MISC Use to check blood glucose three times daily. Dx: E11.43  . pantoprazole (PROTONIX) 40 MG tablet TAKE 1 TABLET BY MOUTH TWO  TIMES DAILY  . Polyethyl Glycol-Propyl Glycol (SYSTANE OP) Place 1 drop into both eyes 2 (two) times daily.  . pravastatin (PRAVACHOL) 20 MG tablet TAKE 1 TABLET BY MOUTH AT  BEDTIME  . pyridOXINE (VITAMIN B-6) 100 MG tablet Take 100 mg by mouth daily.  . sodium phosphate (FLEET) 7-19 GM/118ML ENEM Place 1 enema rectally once a week.  . tamsulosin (FLOMAX) 0.4 MG CAPS capsule TAKE 1 CAPSULE BY MOUTH AT  BEDTIME   No facility-administered encounter medications on file as of 03/08/2018.     Review of Systems  Constitutional: Positive for appetite change. Negative for chills, fatigue and fever.  HENT: Positive for hearing loss. Negative for congestion, ear pain, postnasal drip, rhinorrhea, sinus pressure, sinus pain, sneezing and sore throat.   Eyes: Positive for visual disturbance. Negative for discharge and redness.       Wears eye glasses  Respiratory: Positive for cough. Negative for chest tightness, shortness of breath and wheezing.   Cardiovascular: Negative for chest pain, palpitations and leg swelling.  Gastrointestinal: Negative for abdominal  distention, abdominal pain, constipation, diarrhea, nausea and vomiting.  Genitourinary: Negative for dysuria, flank pain, frequency and urgency.  Musculoskeletal: Positive for gait problem.  Skin: Negative for color change, pallor and rash.  Neurological: Negative for dizziness, light-headedness and headaches.  Psychiatric/Behavioral: Negative for agitation and sleep disturbance. The patient is not nervous/anxious.     Immunization History  Administered Date(s) Administered  . Influenza Split 12/08/2011  . Influenza Whole 11/25/2005, 11/24/2006  . Influenza, High Dose Seasonal PF 11/04/2017  . Influenza,inj,Quad PF,6+ Mos 11/07/2012, 12/08/2016  . Pneumococcal Conjugate-13 10/17/2014  . Pneumococcal Polysaccharide-23 12/22/2007  . Td 11/22/2003  . Tdap 05/27/2017  . Zoster Recombinat (Shingrix) 07/11/2017, 01/23/2018   Pertinent  Health Maintenance Due  Topic Date Due  . COLONOSCOPY  11/23/2004  . FOOT EXAM  10/22/2017  . HEMOGLOBIN A1C  05/03/2018  . OPHTHALMOLOGY EXAM  10/01/2018  .  INFLUENZA VACCINE  Completed  . PNA vac Low Risk Adult  Completed   Fall Risk  03/08/2018 02/28/2018 12/01/2017 11/04/2017 08/04/2017  Falls in the past year? 0 1 Yes Yes Yes  Number falls in past yr: 0 1 2 or more 2 or more 2 or more  Comment - - - - -  Injury with Fall? 0 0 No Yes No  Comment - - - - -  Risk Factor Category  - - - - -  Risk for fall due to : - Impaired balance/gait - - -  Follow up - - - - -    Vitals:   03/08/18 1419  BP: 140/60  Pulse: (!) 59  Temp: (!) 97.1 F (36.2 C)  TempSrc: Oral  SpO2: 96%  Weight: 145 lb 6.4 oz (66 kg)  Height: 6\' 1"  (1.854 m)   Body mass index is 19.18 kg/m. Physical Exam Vitals signs reviewed.  Constitutional:      General: He is not in acute distress.    Appearance: He is not ill-appearing.  HENT:     Head: Normocephalic.     Right Ear: Tympanic membrane, ear canal and external ear normal. There is no impacted cerumen.     Left Ear:  Tympanic membrane, ear canal and external ear normal. There is no impacted cerumen.     Nose: Nose normal. No congestion or rhinorrhea.     Mouth/Throat:     Mouth: Mucous membranes are moist.     Pharynx: Oropharynx is clear. No oropharyngeal exudate or posterior oropharyngeal erythema.  Eyes:     General: No scleral icterus.       Right eye: No discharge.        Left eye: No discharge.     Extraocular Movements: Extraocular movements intact.     Conjunctiva/sclera: Conjunctivae normal.     Pupils: Pupils are equal, round, and reactive to light.  Neck:     Musculoskeletal: Normal range of motion. No muscular tenderness.  Cardiovascular:     Rate and Rhythm: Normal rate and regular rhythm.     Pulses: Normal pulses.     Heart sounds: Normal heart sounds. No murmur. No friction rub. No gallop.   Pulmonary:     Effort: Pulmonary effort is normal. No respiratory distress.     Breath sounds: No wheezing, rhonchi or rales.     Comments: Diminished Bilateral lung bases unable to take deep breaths due fear of coughing.  Abdominal:     General: Bowel sounds are normal. There is no distension.     Palpations: Abdomen is soft. There is no mass.     Tenderness: There is no abdominal tenderness. There is no right CVA tenderness, left CVA tenderness, guarding or rebound.  Musculoskeletal:        General: No swelling or tenderness.     Right lower leg: No edema.     Left lower leg: No edema.     Comments: Wheelchair bound requires transfer with hoyer lift   Lymphadenopathy:     Cervical: No cervical adenopathy.  Skin:    General: Skin is warm and dry.     Coloration: Skin is not pale.     Findings: No erythema, lesion or rash.  Neurological:     Mental Status: He is alert. Mental status is at baseline.     Gait: Gait abnormal.  Psychiatric:        Mood and Affect: Mood normal.  Behavior: Behavior normal.    Labs reviewed: Recent Labs    01/04/18 0814 02/01/18 1834  02/28/18 1146  NA 143 142 142  K 3.9 3.6 4.2  CL 108 102 102  CO2 30 28 32  GLUCOSE 93 155* 101*  BUN 20 20 28*  CREATININE 0.91 1.02 1.00  CALCIUM 8.5* 9.6 10.2   Recent Labs    11/21/17 1648 01/03/18 2101 02/01/18 1834  AST 27 18 24   ALT 15 <5 9  ALKPHOS 63 57 61  BILITOT 1.1 0.8 0.6  PROT 7.1 6.9 6.7  ALBUMIN 3.9 3.9 3.7   Recent Labs    01/03/18 2101 01/04/18 0814 02/01/18 1834 02/28/18 1146  WBC 13.1* 6.7 12.9* 6.8  NEUTROABS 11.1*  --  10.7* 4,209  HGB 14.0 12.0* 14.6 14.5  HCT 42.5 37.8* 45.9 42.9  MCV 99.5 101.3* 98.7 94.5  PLT 250 211 251 279   Lab Results  Component Value Date   TSH 0.990 11/21/2017   Lab Results  Component Value Date   HGBA1C 5.7 (H) 11/02/2017   Lab Results  Component Value Date   CHOL 154 10/20/2016   HDL 51 10/20/2016   LDLCALC 83 10/20/2016   LDLDIRECT 68.6 05/13/2012   TRIG 99 10/20/2016   CHOLHDL 3.0 10/20/2016    Significant Diagnostic Results in last 30 days:  No results found.  Assessment/Plan  Productive cough Afebrile.bilateral lung bases diminished though patient unable to take deep breaths due to fear for pain with coughing.Complete current oral antibiotics as prescribed 02/28/2017.No distress noted. - DG Chest 2 View; Future rule out pneumonia  - CBC with Differential/Platelet  Family/ staff Communication: Reviewed plan of care with patient and wife.  Labs/tests ordered: - DG Chest 2 View; Future - CBC with Differential/Platelet  Caesar Bookmaninah C Kinzey Sheriff, NP

## 2018-03-09 MED ORDER — PROBIOTIC 250 MG PO CAPS: 1.0000 | ORAL_CAPSULE | Freq: Two times a day (BID) | ORAL | 0 refills | Status: AC

## 2018-03-09 MED ORDER — DOXYCYCLINE HYCLATE 100 MG PO TABS: 100.0000 mg | ORAL_TABLET | Freq: Two times a day (BID) | ORAL | 0 refills | Status: AC

## 2018-03-09 NOTE — Addendum Note (Signed)
Addended byRicharda Blade: NGETICH, DINAH C on: 03/09/2018 11:45 AM   Modules accepted: Orders

## 2018-03-10 DIAGNOSIS — I452 Bifascicular block: Secondary | ICD-10-CM | POA: Diagnosis not present

## 2018-03-10 DIAGNOSIS — N401 Enlarged prostate with lower urinary tract symptoms: Secondary | ICD-10-CM | POA: Diagnosis not present

## 2018-03-10 DIAGNOSIS — R1310 Dysphagia, unspecified: Secondary | ICD-10-CM | POA: Diagnosis not present

## 2018-03-10 DIAGNOSIS — G2581 Restless legs syndrome: Secondary | ICD-10-CM | POA: Diagnosis not present

## 2018-03-10 DIAGNOSIS — K219 Gastro-esophageal reflux disease without esophagitis: Secondary | ICD-10-CM | POA: Diagnosis not present

## 2018-03-10 DIAGNOSIS — E785 Hyperlipidemia, unspecified: Secondary | ICD-10-CM | POA: Diagnosis not present

## 2018-03-10 DIAGNOSIS — H04123 Dry eye syndrome of bilateral lacrimal glands: Secondary | ICD-10-CM | POA: Diagnosis not present

## 2018-03-10 DIAGNOSIS — E119 Type 2 diabetes mellitus without complications: Secondary | ICD-10-CM | POA: Diagnosis not present

## 2018-03-10 DIAGNOSIS — E43 Unspecified severe protein-calorie malnutrition: Secondary | ICD-10-CM | POA: Diagnosis not present

## 2018-03-10 DIAGNOSIS — E039 Hypothyroidism, unspecified: Secondary | ICD-10-CM | POA: Diagnosis not present

## 2018-03-10 DIAGNOSIS — I10 Essential (primary) hypertension: Secondary | ICD-10-CM | POA: Diagnosis not present

## 2018-03-10 DIAGNOSIS — G20 Parkinson's disease: Secondary | ICD-10-CM | POA: Diagnosis not present

## 2018-03-11 ENCOUNTER — Telehealth: Payer: Self-pay | Admitting: *Deleted

## 2018-03-11 DIAGNOSIS — R1310 Dysphagia, unspecified: Secondary | ICD-10-CM | POA: Diagnosis not present

## 2018-03-11 DIAGNOSIS — E785 Hyperlipidemia, unspecified: Secondary | ICD-10-CM | POA: Diagnosis not present

## 2018-03-11 DIAGNOSIS — E43 Unspecified severe protein-calorie malnutrition: Secondary | ICD-10-CM | POA: Diagnosis not present

## 2018-03-11 DIAGNOSIS — G20 Parkinson's disease: Secondary | ICD-10-CM | POA: Diagnosis not present

## 2018-03-11 DIAGNOSIS — I10 Essential (primary) hypertension: Secondary | ICD-10-CM | POA: Diagnosis not present

## 2018-03-11 DIAGNOSIS — E119 Type 2 diabetes mellitus without complications: Secondary | ICD-10-CM | POA: Diagnosis not present

## 2018-03-11 NOTE — Telephone Encounter (Signed)
Kathie Rhodes, Wife called and stated that they are going to cancel Monday's appointment. Stated that they now have Hospice coming in and they are going to help fill out the MOST forms in home so they don't have to get the patient out.  Appointment canceled.

## 2018-03-11 NOTE — Telephone Encounter (Signed)
I understand, but we will need to get a copy of the MOST form when it's been completed. I'm glad he's gotten admitted and they are helping out.  Could she ask hospice to get that to [place] or bring it by for [place] to make a copy when it's been completed?  We want to have that information in his [facility] chart.

## 2018-03-11 NOTE — Telephone Encounter (Signed)
Spoke with patient's wife and advised results, wife will bring us a copy of the most

## 2018-03-14 ENCOUNTER — Ambulatory Visit: Payer: Medicare Other | Admitting: Internal Medicine

## 2018-03-14 DIAGNOSIS — E43 Unspecified severe protein-calorie malnutrition: Secondary | ICD-10-CM | POA: Diagnosis not present

## 2018-03-14 DIAGNOSIS — R1310 Dysphagia, unspecified: Secondary | ICD-10-CM | POA: Diagnosis not present

## 2018-03-14 DIAGNOSIS — I10 Essential (primary) hypertension: Secondary | ICD-10-CM | POA: Diagnosis not present

## 2018-03-14 DIAGNOSIS — E785 Hyperlipidemia, unspecified: Secondary | ICD-10-CM | POA: Diagnosis not present

## 2018-03-14 DIAGNOSIS — G20 Parkinson's disease: Secondary | ICD-10-CM | POA: Diagnosis not present

## 2018-03-14 DIAGNOSIS — E119 Type 2 diabetes mellitus without complications: Secondary | ICD-10-CM | POA: Diagnosis not present

## 2018-03-16 DIAGNOSIS — E785 Hyperlipidemia, unspecified: Secondary | ICD-10-CM | POA: Diagnosis not present

## 2018-03-16 DIAGNOSIS — G20 Parkinson's disease: Secondary | ICD-10-CM | POA: Diagnosis not present

## 2018-03-16 DIAGNOSIS — R1310 Dysphagia, unspecified: Secondary | ICD-10-CM | POA: Diagnosis not present

## 2018-03-16 DIAGNOSIS — E43 Unspecified severe protein-calorie malnutrition: Secondary | ICD-10-CM | POA: Diagnosis not present

## 2018-03-16 DIAGNOSIS — E119 Type 2 diabetes mellitus without complications: Secondary | ICD-10-CM | POA: Diagnosis not present

## 2018-03-16 DIAGNOSIS — I10 Essential (primary) hypertension: Secondary | ICD-10-CM | POA: Diagnosis not present

## 2018-03-18 DIAGNOSIS — I10 Essential (primary) hypertension: Secondary | ICD-10-CM | POA: Diagnosis not present

## 2018-03-18 DIAGNOSIS — E119 Type 2 diabetes mellitus without complications: Secondary | ICD-10-CM | POA: Diagnosis not present

## 2018-03-18 DIAGNOSIS — R1310 Dysphagia, unspecified: Secondary | ICD-10-CM | POA: Diagnosis not present

## 2018-03-18 DIAGNOSIS — E785 Hyperlipidemia, unspecified: Secondary | ICD-10-CM | POA: Diagnosis not present

## 2018-03-18 DIAGNOSIS — G20 Parkinson's disease: Secondary | ICD-10-CM | POA: Diagnosis not present

## 2018-03-18 DIAGNOSIS — E43 Unspecified severe protein-calorie malnutrition: Secondary | ICD-10-CM | POA: Diagnosis not present

## 2018-03-21 DIAGNOSIS — E43 Unspecified severe protein-calorie malnutrition: Secondary | ICD-10-CM | POA: Diagnosis not present

## 2018-03-21 DIAGNOSIS — E119 Type 2 diabetes mellitus without complications: Secondary | ICD-10-CM | POA: Diagnosis not present

## 2018-03-21 DIAGNOSIS — R1310 Dysphagia, unspecified: Secondary | ICD-10-CM | POA: Diagnosis not present

## 2018-03-21 DIAGNOSIS — I10 Essential (primary) hypertension: Secondary | ICD-10-CM | POA: Diagnosis not present

## 2018-03-21 DIAGNOSIS — G20 Parkinson's disease: Secondary | ICD-10-CM | POA: Diagnosis not present

## 2018-03-21 DIAGNOSIS — E785 Hyperlipidemia, unspecified: Secondary | ICD-10-CM | POA: Diagnosis not present

## 2018-03-22 ENCOUNTER — Ambulatory Visit: Payer: Medicare Other | Admitting: Neurology

## 2018-03-22 DIAGNOSIS — R1310 Dysphagia, unspecified: Secondary | ICD-10-CM | POA: Diagnosis not present

## 2018-03-22 DIAGNOSIS — E119 Type 2 diabetes mellitus without complications: Secondary | ICD-10-CM | POA: Diagnosis not present

## 2018-03-22 DIAGNOSIS — E43 Unspecified severe protein-calorie malnutrition: Secondary | ICD-10-CM | POA: Diagnosis not present

## 2018-03-22 DIAGNOSIS — E785 Hyperlipidemia, unspecified: Secondary | ICD-10-CM | POA: Diagnosis not present

## 2018-03-22 DIAGNOSIS — G20 Parkinson's disease: Secondary | ICD-10-CM | POA: Diagnosis not present

## 2018-03-22 DIAGNOSIS — I10 Essential (primary) hypertension: Secondary | ICD-10-CM | POA: Diagnosis not present

## 2018-03-23 DIAGNOSIS — E119 Type 2 diabetes mellitus without complications: Secondary | ICD-10-CM | POA: Diagnosis not present

## 2018-03-23 DIAGNOSIS — E785 Hyperlipidemia, unspecified: Secondary | ICD-10-CM | POA: Diagnosis not present

## 2018-03-23 DIAGNOSIS — G20 Parkinson's disease: Secondary | ICD-10-CM | POA: Diagnosis not present

## 2018-03-23 DIAGNOSIS — R1310 Dysphagia, unspecified: Secondary | ICD-10-CM | POA: Diagnosis not present

## 2018-03-23 DIAGNOSIS — I10 Essential (primary) hypertension: Secondary | ICD-10-CM | POA: Diagnosis not present

## 2018-03-23 DIAGNOSIS — E43 Unspecified severe protein-calorie malnutrition: Secondary | ICD-10-CM | POA: Diagnosis not present

## 2018-03-24 DIAGNOSIS — I10 Essential (primary) hypertension: Secondary | ICD-10-CM | POA: Diagnosis not present

## 2018-03-24 DIAGNOSIS — E119 Type 2 diabetes mellitus without complications: Secondary | ICD-10-CM | POA: Diagnosis not present

## 2018-03-24 DIAGNOSIS — R1310 Dysphagia, unspecified: Secondary | ICD-10-CM | POA: Diagnosis not present

## 2018-03-24 DIAGNOSIS — E785 Hyperlipidemia, unspecified: Secondary | ICD-10-CM | POA: Diagnosis not present

## 2018-03-24 DIAGNOSIS — G20 Parkinson's disease: Secondary | ICD-10-CM | POA: Diagnosis not present

## 2018-03-24 DIAGNOSIS — E43 Unspecified severe protein-calorie malnutrition: Secondary | ICD-10-CM | POA: Diagnosis not present

## 2018-03-25 DIAGNOSIS — E119 Type 2 diabetes mellitus without complications: Secondary | ICD-10-CM | POA: Diagnosis not present

## 2018-03-25 DIAGNOSIS — G20 Parkinson's disease: Secondary | ICD-10-CM | POA: Diagnosis not present

## 2018-03-25 DIAGNOSIS — R1310 Dysphagia, unspecified: Secondary | ICD-10-CM | POA: Diagnosis not present

## 2018-03-25 DIAGNOSIS — I10 Essential (primary) hypertension: Secondary | ICD-10-CM | POA: Diagnosis not present

## 2018-03-25 DIAGNOSIS — E785 Hyperlipidemia, unspecified: Secondary | ICD-10-CM | POA: Diagnosis not present

## 2018-03-25 DIAGNOSIS — E43 Unspecified severe protein-calorie malnutrition: Secondary | ICD-10-CM | POA: Diagnosis not present

## 2018-03-26 DIAGNOSIS — E119 Type 2 diabetes mellitus without complications: Secondary | ICD-10-CM | POA: Diagnosis not present

## 2018-03-26 DIAGNOSIS — E43 Unspecified severe protein-calorie malnutrition: Secondary | ICD-10-CM | POA: Diagnosis not present

## 2018-03-26 DIAGNOSIS — G20 Parkinson's disease: Secondary | ICD-10-CM | POA: Diagnosis not present

## 2018-03-26 DIAGNOSIS — N401 Enlarged prostate with lower urinary tract symptoms: Secondary | ICD-10-CM | POA: Diagnosis not present

## 2018-03-26 DIAGNOSIS — H04123 Dry eye syndrome of bilateral lacrimal glands: Secondary | ICD-10-CM | POA: Diagnosis not present

## 2018-03-26 DIAGNOSIS — E785 Hyperlipidemia, unspecified: Secondary | ICD-10-CM | POA: Diagnosis not present

## 2018-03-26 DIAGNOSIS — K219 Gastro-esophageal reflux disease without esophagitis: Secondary | ICD-10-CM | POA: Diagnosis not present

## 2018-03-26 DIAGNOSIS — G2581 Restless legs syndrome: Secondary | ICD-10-CM | POA: Diagnosis not present

## 2018-03-26 DIAGNOSIS — I452 Bifascicular block: Secondary | ICD-10-CM | POA: Diagnosis not present

## 2018-03-26 DIAGNOSIS — E039 Hypothyroidism, unspecified: Secondary | ICD-10-CM | POA: Diagnosis not present

## 2018-03-26 DIAGNOSIS — I10 Essential (primary) hypertension: Secondary | ICD-10-CM | POA: Diagnosis not present

## 2018-03-26 DIAGNOSIS — R1310 Dysphagia, unspecified: Secondary | ICD-10-CM | POA: Diagnosis not present

## 2018-03-28 DIAGNOSIS — E43 Unspecified severe protein-calorie malnutrition: Secondary | ICD-10-CM | POA: Diagnosis not present

## 2018-03-28 DIAGNOSIS — G20 Parkinson's disease: Secondary | ICD-10-CM | POA: Diagnosis not present

## 2018-03-28 DIAGNOSIS — E119 Type 2 diabetes mellitus without complications: Secondary | ICD-10-CM | POA: Diagnosis not present

## 2018-03-28 DIAGNOSIS — I10 Essential (primary) hypertension: Secondary | ICD-10-CM | POA: Diagnosis not present

## 2018-03-28 DIAGNOSIS — R1310 Dysphagia, unspecified: Secondary | ICD-10-CM | POA: Diagnosis not present

## 2018-03-28 DIAGNOSIS — E785 Hyperlipidemia, unspecified: Secondary | ICD-10-CM | POA: Diagnosis not present

## 2018-03-29 DIAGNOSIS — I10 Essential (primary) hypertension: Secondary | ICD-10-CM | POA: Diagnosis not present

## 2018-03-29 DIAGNOSIS — E785 Hyperlipidemia, unspecified: Secondary | ICD-10-CM | POA: Diagnosis not present

## 2018-03-29 DIAGNOSIS — E119 Type 2 diabetes mellitus without complications: Secondary | ICD-10-CM | POA: Diagnosis not present

## 2018-03-29 DIAGNOSIS — G20 Parkinson's disease: Secondary | ICD-10-CM | POA: Diagnosis not present

## 2018-03-29 DIAGNOSIS — R1310 Dysphagia, unspecified: Secondary | ICD-10-CM | POA: Diagnosis not present

## 2018-03-29 DIAGNOSIS — E43 Unspecified severe protein-calorie malnutrition: Secondary | ICD-10-CM | POA: Diagnosis not present

## 2018-03-30 DIAGNOSIS — E785 Hyperlipidemia, unspecified: Secondary | ICD-10-CM | POA: Diagnosis not present

## 2018-03-30 DIAGNOSIS — E43 Unspecified severe protein-calorie malnutrition: Secondary | ICD-10-CM | POA: Diagnosis not present

## 2018-03-30 DIAGNOSIS — I10 Essential (primary) hypertension: Secondary | ICD-10-CM | POA: Diagnosis not present

## 2018-03-30 DIAGNOSIS — G20 Parkinson's disease: Secondary | ICD-10-CM | POA: Diagnosis not present

## 2018-03-30 DIAGNOSIS — R1310 Dysphagia, unspecified: Secondary | ICD-10-CM | POA: Diagnosis not present

## 2018-03-30 DIAGNOSIS — E119 Type 2 diabetes mellitus without complications: Secondary | ICD-10-CM | POA: Diagnosis not present

## 2018-03-31 DIAGNOSIS — G20 Parkinson's disease: Secondary | ICD-10-CM | POA: Diagnosis not present

## 2018-03-31 DIAGNOSIS — R1310 Dysphagia, unspecified: Secondary | ICD-10-CM | POA: Diagnosis not present

## 2018-03-31 DIAGNOSIS — E119 Type 2 diabetes mellitus without complications: Secondary | ICD-10-CM | POA: Diagnosis not present

## 2018-03-31 DIAGNOSIS — E785 Hyperlipidemia, unspecified: Secondary | ICD-10-CM | POA: Diagnosis not present

## 2018-03-31 DIAGNOSIS — E43 Unspecified severe protein-calorie malnutrition: Secondary | ICD-10-CM | POA: Diagnosis not present

## 2018-03-31 DIAGNOSIS — I10 Essential (primary) hypertension: Secondary | ICD-10-CM | POA: Diagnosis not present

## 2018-04-01 DIAGNOSIS — E119 Type 2 diabetes mellitus without complications: Secondary | ICD-10-CM | POA: Diagnosis not present

## 2018-04-01 DIAGNOSIS — E785 Hyperlipidemia, unspecified: Secondary | ICD-10-CM | POA: Diagnosis not present

## 2018-04-01 DIAGNOSIS — E43 Unspecified severe protein-calorie malnutrition: Secondary | ICD-10-CM | POA: Diagnosis not present

## 2018-04-01 DIAGNOSIS — R1310 Dysphagia, unspecified: Secondary | ICD-10-CM | POA: Diagnosis not present

## 2018-04-01 DIAGNOSIS — I10 Essential (primary) hypertension: Secondary | ICD-10-CM | POA: Diagnosis not present

## 2018-04-01 DIAGNOSIS — G20 Parkinson's disease: Secondary | ICD-10-CM | POA: Diagnosis not present

## 2018-04-04 DIAGNOSIS — E785 Hyperlipidemia, unspecified: Secondary | ICD-10-CM | POA: Diagnosis not present

## 2018-04-04 DIAGNOSIS — G20 Parkinson's disease: Secondary | ICD-10-CM | POA: Diagnosis not present

## 2018-04-04 DIAGNOSIS — E119 Type 2 diabetes mellitus without complications: Secondary | ICD-10-CM | POA: Diagnosis not present

## 2018-04-04 DIAGNOSIS — R1310 Dysphagia, unspecified: Secondary | ICD-10-CM | POA: Diagnosis not present

## 2018-04-04 DIAGNOSIS — E43 Unspecified severe protein-calorie malnutrition: Secondary | ICD-10-CM | POA: Diagnosis not present

## 2018-04-04 DIAGNOSIS — I10 Essential (primary) hypertension: Secondary | ICD-10-CM | POA: Diagnosis not present

## 2018-04-05 DIAGNOSIS — R1310 Dysphagia, unspecified: Secondary | ICD-10-CM | POA: Diagnosis not present

## 2018-04-05 DIAGNOSIS — E785 Hyperlipidemia, unspecified: Secondary | ICD-10-CM | POA: Diagnosis not present

## 2018-04-05 DIAGNOSIS — G20 Parkinson's disease: Secondary | ICD-10-CM | POA: Diagnosis not present

## 2018-04-05 DIAGNOSIS — E119 Type 2 diabetes mellitus without complications: Secondary | ICD-10-CM | POA: Diagnosis not present

## 2018-04-05 DIAGNOSIS — I10 Essential (primary) hypertension: Secondary | ICD-10-CM | POA: Diagnosis not present

## 2018-04-05 DIAGNOSIS — E43 Unspecified severe protein-calorie malnutrition: Secondary | ICD-10-CM | POA: Diagnosis not present

## 2018-04-06 ENCOUNTER — Telehealth: Payer: Self-pay | Admitting: *Deleted

## 2018-04-06 DIAGNOSIS — I10 Essential (primary) hypertension: Secondary | ICD-10-CM | POA: Diagnosis not present

## 2018-04-06 DIAGNOSIS — E785 Hyperlipidemia, unspecified: Secondary | ICD-10-CM | POA: Diagnosis not present

## 2018-04-06 DIAGNOSIS — R1310 Dysphagia, unspecified: Secondary | ICD-10-CM | POA: Diagnosis not present

## 2018-04-06 DIAGNOSIS — G20 Parkinson's disease: Secondary | ICD-10-CM | POA: Diagnosis not present

## 2018-04-06 DIAGNOSIS — E43 Unspecified severe protein-calorie malnutrition: Secondary | ICD-10-CM | POA: Diagnosis not present

## 2018-04-06 DIAGNOSIS — E119 Type 2 diabetes mellitus without complications: Secondary | ICD-10-CM | POA: Diagnosis not present

## 2018-04-06 NOTE — Telephone Encounter (Signed)
No, it's no longer appropriate for a patient on hospice to have a wellness visit.  We should not schedule any hospice patients for wellness visits.  If possible, I would like to still see him for routine visits if they want me to continue to oversee his care.  Otherwise, the hospice attending would need to assume that responsibility if I cannot see him.

## 2018-04-06 NOTE — Telephone Encounter (Signed)
Wife Notified and agreed. Appointment canceled for AWV.

## 2018-04-06 NOTE — Telephone Encounter (Signed)
Patient Wife, [name] called and stated that patient is scheduled for a AWV for 05/23/18. Wife stated that it is so hard to get patient into office now. Wants to know since they now have Hospice coming in do they need to bring him into the office for the AWV. Please Advise.

## 2018-04-07 DIAGNOSIS — E43 Unspecified severe protein-calorie malnutrition: Secondary | ICD-10-CM | POA: Diagnosis not present

## 2018-04-07 DIAGNOSIS — E785 Hyperlipidemia, unspecified: Secondary | ICD-10-CM | POA: Diagnosis not present

## 2018-04-07 DIAGNOSIS — E119 Type 2 diabetes mellitus without complications: Secondary | ICD-10-CM | POA: Diagnosis not present

## 2018-04-07 DIAGNOSIS — G20 Parkinson's disease: Secondary | ICD-10-CM | POA: Diagnosis not present

## 2018-04-07 DIAGNOSIS — R1310 Dysphagia, unspecified: Secondary | ICD-10-CM | POA: Diagnosis not present

## 2018-04-07 DIAGNOSIS — I10 Essential (primary) hypertension: Secondary | ICD-10-CM | POA: Diagnosis not present

## 2018-04-08 DIAGNOSIS — E119 Type 2 diabetes mellitus without complications: Secondary | ICD-10-CM | POA: Diagnosis not present

## 2018-04-08 DIAGNOSIS — E43 Unspecified severe protein-calorie malnutrition: Secondary | ICD-10-CM | POA: Diagnosis not present

## 2018-04-08 DIAGNOSIS — R1310 Dysphagia, unspecified: Secondary | ICD-10-CM | POA: Diagnosis not present

## 2018-04-08 DIAGNOSIS — I10 Essential (primary) hypertension: Secondary | ICD-10-CM | POA: Diagnosis not present

## 2018-04-08 DIAGNOSIS — G20 Parkinson's disease: Secondary | ICD-10-CM | POA: Diagnosis not present

## 2018-04-08 DIAGNOSIS — E785 Hyperlipidemia, unspecified: Secondary | ICD-10-CM | POA: Diagnosis not present

## 2018-04-09 DIAGNOSIS — E785 Hyperlipidemia, unspecified: Secondary | ICD-10-CM | POA: Diagnosis not present

## 2018-04-09 DIAGNOSIS — E119 Type 2 diabetes mellitus without complications: Secondary | ICD-10-CM | POA: Diagnosis not present

## 2018-04-09 DIAGNOSIS — I10 Essential (primary) hypertension: Secondary | ICD-10-CM | POA: Diagnosis not present

## 2018-04-09 DIAGNOSIS — G20 Parkinson's disease: Secondary | ICD-10-CM | POA: Diagnosis not present

## 2018-04-09 DIAGNOSIS — E43 Unspecified severe protein-calorie malnutrition: Secondary | ICD-10-CM | POA: Diagnosis not present

## 2018-04-09 DIAGNOSIS — R1310 Dysphagia, unspecified: Secondary | ICD-10-CM | POA: Diagnosis not present

## 2018-04-11 DIAGNOSIS — E785 Hyperlipidemia, unspecified: Secondary | ICD-10-CM | POA: Diagnosis not present

## 2018-04-11 DIAGNOSIS — E43 Unspecified severe protein-calorie malnutrition: Secondary | ICD-10-CM | POA: Diagnosis not present

## 2018-04-11 DIAGNOSIS — I10 Essential (primary) hypertension: Secondary | ICD-10-CM | POA: Diagnosis not present

## 2018-04-11 DIAGNOSIS — R1310 Dysphagia, unspecified: Secondary | ICD-10-CM | POA: Diagnosis not present

## 2018-04-11 DIAGNOSIS — E119 Type 2 diabetes mellitus without complications: Secondary | ICD-10-CM | POA: Diagnosis not present

## 2018-04-11 DIAGNOSIS — G20 Parkinson's disease: Secondary | ICD-10-CM | POA: Diagnosis not present

## 2018-04-12 DIAGNOSIS — E119 Type 2 diabetes mellitus without complications: Secondary | ICD-10-CM | POA: Diagnosis not present

## 2018-04-12 DIAGNOSIS — G20 Parkinson's disease: Secondary | ICD-10-CM | POA: Diagnosis not present

## 2018-04-12 DIAGNOSIS — I10 Essential (primary) hypertension: Secondary | ICD-10-CM | POA: Diagnosis not present

## 2018-04-12 DIAGNOSIS — E785 Hyperlipidemia, unspecified: Secondary | ICD-10-CM | POA: Diagnosis not present

## 2018-04-12 DIAGNOSIS — E43 Unspecified severe protein-calorie malnutrition: Secondary | ICD-10-CM | POA: Diagnosis not present

## 2018-04-12 DIAGNOSIS — R1310 Dysphagia, unspecified: Secondary | ICD-10-CM | POA: Diagnosis not present

## 2018-04-13 DIAGNOSIS — E119 Type 2 diabetes mellitus without complications: Secondary | ICD-10-CM | POA: Diagnosis not present

## 2018-04-13 DIAGNOSIS — E43 Unspecified severe protein-calorie malnutrition: Secondary | ICD-10-CM | POA: Diagnosis not present

## 2018-04-13 DIAGNOSIS — E785 Hyperlipidemia, unspecified: Secondary | ICD-10-CM | POA: Diagnosis not present

## 2018-04-13 DIAGNOSIS — I10 Essential (primary) hypertension: Secondary | ICD-10-CM | POA: Diagnosis not present

## 2018-04-13 DIAGNOSIS — R1310 Dysphagia, unspecified: Secondary | ICD-10-CM | POA: Diagnosis not present

## 2018-04-13 DIAGNOSIS — G20 Parkinson's disease: Secondary | ICD-10-CM | POA: Diagnosis not present

## 2018-04-14 DIAGNOSIS — R1310 Dysphagia, unspecified: Secondary | ICD-10-CM | POA: Diagnosis not present

## 2018-04-14 DIAGNOSIS — I10 Essential (primary) hypertension: Secondary | ICD-10-CM | POA: Diagnosis not present

## 2018-04-14 DIAGNOSIS — E43 Unspecified severe protein-calorie malnutrition: Secondary | ICD-10-CM | POA: Diagnosis not present

## 2018-04-14 DIAGNOSIS — E119 Type 2 diabetes mellitus without complications: Secondary | ICD-10-CM | POA: Diagnosis not present

## 2018-04-14 DIAGNOSIS — E785 Hyperlipidemia, unspecified: Secondary | ICD-10-CM | POA: Diagnosis not present

## 2018-04-14 DIAGNOSIS — G20 Parkinson's disease: Secondary | ICD-10-CM | POA: Diagnosis not present

## 2018-04-15 DIAGNOSIS — E119 Type 2 diabetes mellitus without complications: Secondary | ICD-10-CM | POA: Diagnosis not present

## 2018-04-15 DIAGNOSIS — G20 Parkinson's disease: Secondary | ICD-10-CM | POA: Diagnosis not present

## 2018-04-15 DIAGNOSIS — R1310 Dysphagia, unspecified: Secondary | ICD-10-CM | POA: Diagnosis not present

## 2018-04-15 DIAGNOSIS — I10 Essential (primary) hypertension: Secondary | ICD-10-CM | POA: Diagnosis not present

## 2018-04-15 DIAGNOSIS — E43 Unspecified severe protein-calorie malnutrition: Secondary | ICD-10-CM | POA: Diagnosis not present

## 2018-04-15 DIAGNOSIS — E785 Hyperlipidemia, unspecified: Secondary | ICD-10-CM | POA: Diagnosis not present

## 2018-04-18 DIAGNOSIS — G20 Parkinson's disease: Secondary | ICD-10-CM | POA: Diagnosis not present

## 2018-04-18 DIAGNOSIS — R1310 Dysphagia, unspecified: Secondary | ICD-10-CM | POA: Diagnosis not present

## 2018-04-18 DIAGNOSIS — E43 Unspecified severe protein-calorie malnutrition: Secondary | ICD-10-CM | POA: Diagnosis not present

## 2018-04-18 DIAGNOSIS — I10 Essential (primary) hypertension: Secondary | ICD-10-CM | POA: Diagnosis not present

## 2018-04-18 DIAGNOSIS — E119 Type 2 diabetes mellitus without complications: Secondary | ICD-10-CM | POA: Diagnosis not present

## 2018-04-18 DIAGNOSIS — E785 Hyperlipidemia, unspecified: Secondary | ICD-10-CM | POA: Diagnosis not present

## 2018-04-19 DIAGNOSIS — R1310 Dysphagia, unspecified: Secondary | ICD-10-CM | POA: Diagnosis not present

## 2018-04-19 DIAGNOSIS — E119 Type 2 diabetes mellitus without complications: Secondary | ICD-10-CM | POA: Diagnosis not present

## 2018-04-19 DIAGNOSIS — E785 Hyperlipidemia, unspecified: Secondary | ICD-10-CM | POA: Diagnosis not present

## 2018-04-19 DIAGNOSIS — G20 Parkinson's disease: Secondary | ICD-10-CM | POA: Diagnosis not present

## 2018-04-19 DIAGNOSIS — E43 Unspecified severe protein-calorie malnutrition: Secondary | ICD-10-CM | POA: Diagnosis not present

## 2018-04-19 DIAGNOSIS — I10 Essential (primary) hypertension: Secondary | ICD-10-CM | POA: Diagnosis not present

## 2018-04-20 DIAGNOSIS — E785 Hyperlipidemia, unspecified: Secondary | ICD-10-CM | POA: Diagnosis not present

## 2018-04-20 DIAGNOSIS — R1310 Dysphagia, unspecified: Secondary | ICD-10-CM | POA: Diagnosis not present

## 2018-04-20 DIAGNOSIS — G20 Parkinson's disease: Secondary | ICD-10-CM | POA: Diagnosis not present

## 2018-04-20 DIAGNOSIS — E43 Unspecified severe protein-calorie malnutrition: Secondary | ICD-10-CM | POA: Diagnosis not present

## 2018-04-20 DIAGNOSIS — I10 Essential (primary) hypertension: Secondary | ICD-10-CM | POA: Diagnosis not present

## 2018-04-20 DIAGNOSIS — E119 Type 2 diabetes mellitus without complications: Secondary | ICD-10-CM | POA: Diagnosis not present

## 2018-04-21 DIAGNOSIS — G20 Parkinson's disease: Secondary | ICD-10-CM | POA: Diagnosis not present

## 2018-04-21 DIAGNOSIS — I10 Essential (primary) hypertension: Secondary | ICD-10-CM | POA: Diagnosis not present

## 2018-04-21 DIAGNOSIS — E43 Unspecified severe protein-calorie malnutrition: Secondary | ICD-10-CM | POA: Diagnosis not present

## 2018-04-21 DIAGNOSIS — E119 Type 2 diabetes mellitus without complications: Secondary | ICD-10-CM | POA: Diagnosis not present

## 2018-04-21 DIAGNOSIS — R1310 Dysphagia, unspecified: Secondary | ICD-10-CM | POA: Diagnosis not present

## 2018-04-21 DIAGNOSIS — E785 Hyperlipidemia, unspecified: Secondary | ICD-10-CM | POA: Diagnosis not present

## 2018-04-22 DIAGNOSIS — E785 Hyperlipidemia, unspecified: Secondary | ICD-10-CM | POA: Diagnosis not present

## 2018-04-22 DIAGNOSIS — R1310 Dysphagia, unspecified: Secondary | ICD-10-CM | POA: Diagnosis not present

## 2018-04-22 DIAGNOSIS — E43 Unspecified severe protein-calorie malnutrition: Secondary | ICD-10-CM | POA: Diagnosis not present

## 2018-04-22 DIAGNOSIS — G20 Parkinson's disease: Secondary | ICD-10-CM | POA: Diagnosis not present

## 2018-04-22 DIAGNOSIS — I10 Essential (primary) hypertension: Secondary | ICD-10-CM | POA: Diagnosis not present

## 2018-04-22 DIAGNOSIS — E119 Type 2 diabetes mellitus without complications: Secondary | ICD-10-CM | POA: Diagnosis not present

## 2018-04-24 DIAGNOSIS — N401 Enlarged prostate with lower urinary tract symptoms: Secondary | ICD-10-CM | POA: Diagnosis not present

## 2018-04-24 DIAGNOSIS — H04123 Dry eye syndrome of bilateral lacrimal glands: Secondary | ICD-10-CM | POA: Diagnosis not present

## 2018-04-24 DIAGNOSIS — E039 Hypothyroidism, unspecified: Secondary | ICD-10-CM | POA: Diagnosis not present

## 2018-04-24 DIAGNOSIS — E119 Type 2 diabetes mellitus without complications: Secondary | ICD-10-CM | POA: Diagnosis not present

## 2018-04-24 DIAGNOSIS — I10 Essential (primary) hypertension: Secondary | ICD-10-CM | POA: Diagnosis not present

## 2018-04-24 DIAGNOSIS — I452 Bifascicular block: Secondary | ICD-10-CM | POA: Diagnosis not present

## 2018-04-24 DIAGNOSIS — G2581 Restless legs syndrome: Secondary | ICD-10-CM | POA: Diagnosis not present

## 2018-04-24 DIAGNOSIS — E785 Hyperlipidemia, unspecified: Secondary | ICD-10-CM | POA: Diagnosis not present

## 2018-04-24 DIAGNOSIS — K219 Gastro-esophageal reflux disease without esophagitis: Secondary | ICD-10-CM | POA: Diagnosis not present

## 2018-04-24 DIAGNOSIS — G20 Parkinson's disease: Secondary | ICD-10-CM | POA: Diagnosis not present

## 2018-04-24 DIAGNOSIS — R1310 Dysphagia, unspecified: Secondary | ICD-10-CM | POA: Diagnosis not present

## 2018-04-24 DIAGNOSIS — E43 Unspecified severe protein-calorie malnutrition: Secondary | ICD-10-CM | POA: Diagnosis not present

## 2018-04-25 ENCOUNTER — Other Ambulatory Visit: Payer: Self-pay | Admitting: Internal Medicine

## 2018-04-25 DIAGNOSIS — E43 Unspecified severe protein-calorie malnutrition: Secondary | ICD-10-CM | POA: Diagnosis not present

## 2018-04-25 DIAGNOSIS — E785 Hyperlipidemia, unspecified: Secondary | ICD-10-CM | POA: Diagnosis not present

## 2018-04-25 DIAGNOSIS — I10 Essential (primary) hypertension: Secondary | ICD-10-CM | POA: Diagnosis not present

## 2018-04-25 DIAGNOSIS — E119 Type 2 diabetes mellitus without complications: Secondary | ICD-10-CM | POA: Diagnosis not present

## 2018-04-25 DIAGNOSIS — R1310 Dysphagia, unspecified: Secondary | ICD-10-CM | POA: Diagnosis not present

## 2018-04-25 DIAGNOSIS — G20 Parkinson's disease: Secondary | ICD-10-CM | POA: Diagnosis not present

## 2018-04-26 DIAGNOSIS — I10 Essential (primary) hypertension: Secondary | ICD-10-CM | POA: Diagnosis not present

## 2018-04-26 DIAGNOSIS — E785 Hyperlipidemia, unspecified: Secondary | ICD-10-CM | POA: Diagnosis not present

## 2018-04-26 DIAGNOSIS — R1310 Dysphagia, unspecified: Secondary | ICD-10-CM | POA: Diagnosis not present

## 2018-04-26 DIAGNOSIS — G20 Parkinson's disease: Secondary | ICD-10-CM | POA: Diagnosis not present

## 2018-04-26 DIAGNOSIS — E43 Unspecified severe protein-calorie malnutrition: Secondary | ICD-10-CM | POA: Diagnosis not present

## 2018-04-26 DIAGNOSIS — E119 Type 2 diabetes mellitus without complications: Secondary | ICD-10-CM | POA: Diagnosis not present

## 2018-04-27 DIAGNOSIS — E785 Hyperlipidemia, unspecified: Secondary | ICD-10-CM | POA: Diagnosis not present

## 2018-04-27 DIAGNOSIS — I10 Essential (primary) hypertension: Secondary | ICD-10-CM | POA: Diagnosis not present

## 2018-04-27 DIAGNOSIS — G20 Parkinson's disease: Secondary | ICD-10-CM | POA: Diagnosis not present

## 2018-04-27 DIAGNOSIS — E119 Type 2 diabetes mellitus without complications: Secondary | ICD-10-CM | POA: Diagnosis not present

## 2018-04-27 DIAGNOSIS — R1310 Dysphagia, unspecified: Secondary | ICD-10-CM | POA: Diagnosis not present

## 2018-04-27 DIAGNOSIS — E43 Unspecified severe protein-calorie malnutrition: Secondary | ICD-10-CM | POA: Diagnosis not present

## 2018-04-28 DIAGNOSIS — E43 Unspecified severe protein-calorie malnutrition: Secondary | ICD-10-CM | POA: Diagnosis not present

## 2018-04-28 DIAGNOSIS — E119 Type 2 diabetes mellitus without complications: Secondary | ICD-10-CM | POA: Diagnosis not present

## 2018-04-28 DIAGNOSIS — E785 Hyperlipidemia, unspecified: Secondary | ICD-10-CM | POA: Diagnosis not present

## 2018-04-28 DIAGNOSIS — I10 Essential (primary) hypertension: Secondary | ICD-10-CM | POA: Diagnosis not present

## 2018-04-28 DIAGNOSIS — G20 Parkinson's disease: Secondary | ICD-10-CM | POA: Diagnosis not present

## 2018-04-28 DIAGNOSIS — R1310 Dysphagia, unspecified: Secondary | ICD-10-CM | POA: Diagnosis not present

## 2018-04-29 DIAGNOSIS — E119 Type 2 diabetes mellitus without complications: Secondary | ICD-10-CM | POA: Diagnosis not present

## 2018-04-29 DIAGNOSIS — I10 Essential (primary) hypertension: Secondary | ICD-10-CM | POA: Diagnosis not present

## 2018-04-29 DIAGNOSIS — R1310 Dysphagia, unspecified: Secondary | ICD-10-CM | POA: Diagnosis not present

## 2018-04-29 DIAGNOSIS — E785 Hyperlipidemia, unspecified: Secondary | ICD-10-CM | POA: Diagnosis not present

## 2018-04-29 DIAGNOSIS — G20 Parkinson's disease: Secondary | ICD-10-CM | POA: Diagnosis not present

## 2018-04-29 DIAGNOSIS — E43 Unspecified severe protein-calorie malnutrition: Secondary | ICD-10-CM | POA: Diagnosis not present

## 2018-05-01 ENCOUNTER — Other Ambulatory Visit: Payer: Self-pay | Admitting: Internal Medicine

## 2018-05-02 DIAGNOSIS — I10 Essential (primary) hypertension: Secondary | ICD-10-CM | POA: Diagnosis not present

## 2018-05-02 DIAGNOSIS — G20 Parkinson's disease: Secondary | ICD-10-CM | POA: Diagnosis not present

## 2018-05-02 DIAGNOSIS — E119 Type 2 diabetes mellitus without complications: Secondary | ICD-10-CM | POA: Diagnosis not present

## 2018-05-02 DIAGNOSIS — E43 Unspecified severe protein-calorie malnutrition: Secondary | ICD-10-CM | POA: Diagnosis not present

## 2018-05-02 DIAGNOSIS — R1310 Dysphagia, unspecified: Secondary | ICD-10-CM | POA: Diagnosis not present

## 2018-05-02 DIAGNOSIS — E785 Hyperlipidemia, unspecified: Secondary | ICD-10-CM | POA: Diagnosis not present

## 2018-05-03 DIAGNOSIS — E43 Unspecified severe protein-calorie malnutrition: Secondary | ICD-10-CM | POA: Diagnosis not present

## 2018-05-03 DIAGNOSIS — E119 Type 2 diabetes mellitus without complications: Secondary | ICD-10-CM | POA: Diagnosis not present

## 2018-05-03 DIAGNOSIS — I10 Essential (primary) hypertension: Secondary | ICD-10-CM | POA: Diagnosis not present

## 2018-05-03 DIAGNOSIS — G20 Parkinson's disease: Secondary | ICD-10-CM | POA: Diagnosis not present

## 2018-05-03 DIAGNOSIS — E785 Hyperlipidemia, unspecified: Secondary | ICD-10-CM | POA: Diagnosis not present

## 2018-05-03 DIAGNOSIS — R1310 Dysphagia, unspecified: Secondary | ICD-10-CM | POA: Diagnosis not present

## 2018-05-04 DIAGNOSIS — G20 Parkinson's disease: Secondary | ICD-10-CM | POA: Diagnosis not present

## 2018-05-04 DIAGNOSIS — R1310 Dysphagia, unspecified: Secondary | ICD-10-CM | POA: Diagnosis not present

## 2018-05-04 DIAGNOSIS — I10 Essential (primary) hypertension: Secondary | ICD-10-CM | POA: Diagnosis not present

## 2018-05-04 DIAGNOSIS — E785 Hyperlipidemia, unspecified: Secondary | ICD-10-CM | POA: Diagnosis not present

## 2018-05-04 DIAGNOSIS — E43 Unspecified severe protein-calorie malnutrition: Secondary | ICD-10-CM | POA: Diagnosis not present

## 2018-05-04 DIAGNOSIS — E119 Type 2 diabetes mellitus without complications: Secondary | ICD-10-CM | POA: Diagnosis not present

## 2018-05-05 DIAGNOSIS — E43 Unspecified severe protein-calorie malnutrition: Secondary | ICD-10-CM | POA: Diagnosis not present

## 2018-05-05 DIAGNOSIS — R1310 Dysphagia, unspecified: Secondary | ICD-10-CM | POA: Diagnosis not present

## 2018-05-05 DIAGNOSIS — G20 Parkinson's disease: Secondary | ICD-10-CM | POA: Diagnosis not present

## 2018-05-05 DIAGNOSIS — E119 Type 2 diabetes mellitus without complications: Secondary | ICD-10-CM | POA: Diagnosis not present

## 2018-05-05 DIAGNOSIS — I10 Essential (primary) hypertension: Secondary | ICD-10-CM | POA: Diagnosis not present

## 2018-05-05 DIAGNOSIS — E785 Hyperlipidemia, unspecified: Secondary | ICD-10-CM | POA: Diagnosis not present

## 2018-05-06 DIAGNOSIS — R1310 Dysphagia, unspecified: Secondary | ICD-10-CM | POA: Diagnosis not present

## 2018-05-06 DIAGNOSIS — G20 Parkinson's disease: Secondary | ICD-10-CM | POA: Diagnosis not present

## 2018-05-06 DIAGNOSIS — I10 Essential (primary) hypertension: Secondary | ICD-10-CM | POA: Diagnosis not present

## 2018-05-06 DIAGNOSIS — E119 Type 2 diabetes mellitus without complications: Secondary | ICD-10-CM | POA: Diagnosis not present

## 2018-05-06 DIAGNOSIS — E785 Hyperlipidemia, unspecified: Secondary | ICD-10-CM | POA: Diagnosis not present

## 2018-05-06 DIAGNOSIS — E43 Unspecified severe protein-calorie malnutrition: Secondary | ICD-10-CM | POA: Diagnosis not present

## 2018-05-09 DIAGNOSIS — E785 Hyperlipidemia, unspecified: Secondary | ICD-10-CM | POA: Diagnosis not present

## 2018-05-09 DIAGNOSIS — E119 Type 2 diabetes mellitus without complications: Secondary | ICD-10-CM | POA: Diagnosis not present

## 2018-05-09 DIAGNOSIS — G20 Parkinson's disease: Secondary | ICD-10-CM | POA: Diagnosis not present

## 2018-05-09 DIAGNOSIS — E43 Unspecified severe protein-calorie malnutrition: Secondary | ICD-10-CM | POA: Diagnosis not present

## 2018-05-09 DIAGNOSIS — R1310 Dysphagia, unspecified: Secondary | ICD-10-CM | POA: Diagnosis not present

## 2018-05-09 DIAGNOSIS — I10 Essential (primary) hypertension: Secondary | ICD-10-CM | POA: Diagnosis not present

## 2018-05-10 DIAGNOSIS — E785 Hyperlipidemia, unspecified: Secondary | ICD-10-CM | POA: Diagnosis not present

## 2018-05-10 DIAGNOSIS — G20 Parkinson's disease: Secondary | ICD-10-CM | POA: Diagnosis not present

## 2018-05-10 DIAGNOSIS — E43 Unspecified severe protein-calorie malnutrition: Secondary | ICD-10-CM | POA: Diagnosis not present

## 2018-05-10 DIAGNOSIS — R1310 Dysphagia, unspecified: Secondary | ICD-10-CM | POA: Diagnosis not present

## 2018-05-10 DIAGNOSIS — E119 Type 2 diabetes mellitus without complications: Secondary | ICD-10-CM | POA: Diagnosis not present

## 2018-05-10 DIAGNOSIS — I10 Essential (primary) hypertension: Secondary | ICD-10-CM | POA: Diagnosis not present

## 2018-05-11 DIAGNOSIS — G20 Parkinson's disease: Secondary | ICD-10-CM | POA: Diagnosis not present

## 2018-05-11 DIAGNOSIS — R1310 Dysphagia, unspecified: Secondary | ICD-10-CM | POA: Diagnosis not present

## 2018-05-11 DIAGNOSIS — E43 Unspecified severe protein-calorie malnutrition: Secondary | ICD-10-CM | POA: Diagnosis not present

## 2018-05-11 DIAGNOSIS — E119 Type 2 diabetes mellitus without complications: Secondary | ICD-10-CM | POA: Diagnosis not present

## 2018-05-11 DIAGNOSIS — I10 Essential (primary) hypertension: Secondary | ICD-10-CM | POA: Diagnosis not present

## 2018-05-11 DIAGNOSIS — E785 Hyperlipidemia, unspecified: Secondary | ICD-10-CM | POA: Diagnosis not present

## 2018-05-12 DIAGNOSIS — E43 Unspecified severe protein-calorie malnutrition: Secondary | ICD-10-CM | POA: Diagnosis not present

## 2018-05-12 DIAGNOSIS — I10 Essential (primary) hypertension: Secondary | ICD-10-CM | POA: Diagnosis not present

## 2018-05-12 DIAGNOSIS — E785 Hyperlipidemia, unspecified: Secondary | ICD-10-CM | POA: Diagnosis not present

## 2018-05-12 DIAGNOSIS — G20 Parkinson's disease: Secondary | ICD-10-CM | POA: Diagnosis not present

## 2018-05-12 DIAGNOSIS — E119 Type 2 diabetes mellitus without complications: Secondary | ICD-10-CM | POA: Diagnosis not present

## 2018-05-12 DIAGNOSIS — R1310 Dysphagia, unspecified: Secondary | ICD-10-CM | POA: Diagnosis not present

## 2018-05-13 DIAGNOSIS — E785 Hyperlipidemia, unspecified: Secondary | ICD-10-CM | POA: Diagnosis not present

## 2018-05-13 DIAGNOSIS — G20 Parkinson's disease: Secondary | ICD-10-CM | POA: Diagnosis not present

## 2018-05-13 DIAGNOSIS — R1310 Dysphagia, unspecified: Secondary | ICD-10-CM | POA: Diagnosis not present

## 2018-05-13 DIAGNOSIS — E119 Type 2 diabetes mellitus without complications: Secondary | ICD-10-CM | POA: Diagnosis not present

## 2018-05-13 DIAGNOSIS — E43 Unspecified severe protein-calorie malnutrition: Secondary | ICD-10-CM | POA: Diagnosis not present

## 2018-05-13 DIAGNOSIS — I10 Essential (primary) hypertension: Secondary | ICD-10-CM | POA: Diagnosis not present

## 2018-05-16 ENCOUNTER — Other Ambulatory Visit: Payer: Self-pay | Admitting: Internal Medicine

## 2018-05-16 DIAGNOSIS — E119 Type 2 diabetes mellitus without complications: Secondary | ICD-10-CM | POA: Diagnosis not present

## 2018-05-16 DIAGNOSIS — R1310 Dysphagia, unspecified: Secondary | ICD-10-CM | POA: Diagnosis not present

## 2018-05-16 DIAGNOSIS — E43 Unspecified severe protein-calorie malnutrition: Secondary | ICD-10-CM | POA: Diagnosis not present

## 2018-05-16 DIAGNOSIS — E785 Hyperlipidemia, unspecified: Secondary | ICD-10-CM | POA: Diagnosis not present

## 2018-05-16 DIAGNOSIS — I10 Essential (primary) hypertension: Secondary | ICD-10-CM | POA: Diagnosis not present

## 2018-05-16 DIAGNOSIS — G20 Parkinson's disease: Secondary | ICD-10-CM | POA: Diagnosis not present

## 2018-05-17 DIAGNOSIS — E43 Unspecified severe protein-calorie malnutrition: Secondary | ICD-10-CM | POA: Diagnosis not present

## 2018-05-17 DIAGNOSIS — E785 Hyperlipidemia, unspecified: Secondary | ICD-10-CM | POA: Diagnosis not present

## 2018-05-17 DIAGNOSIS — G20 Parkinson's disease: Secondary | ICD-10-CM | POA: Diagnosis not present

## 2018-05-17 DIAGNOSIS — R1310 Dysphagia, unspecified: Secondary | ICD-10-CM | POA: Diagnosis not present

## 2018-05-17 DIAGNOSIS — E119 Type 2 diabetes mellitus without complications: Secondary | ICD-10-CM | POA: Diagnosis not present

## 2018-05-17 DIAGNOSIS — I10 Essential (primary) hypertension: Secondary | ICD-10-CM | POA: Diagnosis not present

## 2018-05-18 DIAGNOSIS — E119 Type 2 diabetes mellitus without complications: Secondary | ICD-10-CM | POA: Diagnosis not present

## 2018-05-18 DIAGNOSIS — E785 Hyperlipidemia, unspecified: Secondary | ICD-10-CM | POA: Diagnosis not present

## 2018-05-18 DIAGNOSIS — G20 Parkinson's disease: Secondary | ICD-10-CM | POA: Diagnosis not present

## 2018-05-18 DIAGNOSIS — I10 Essential (primary) hypertension: Secondary | ICD-10-CM | POA: Diagnosis not present

## 2018-05-18 DIAGNOSIS — E43 Unspecified severe protein-calorie malnutrition: Secondary | ICD-10-CM | POA: Diagnosis not present

## 2018-05-18 DIAGNOSIS — R1310 Dysphagia, unspecified: Secondary | ICD-10-CM | POA: Diagnosis not present

## 2018-05-19 DIAGNOSIS — R1310 Dysphagia, unspecified: Secondary | ICD-10-CM | POA: Diagnosis not present

## 2018-05-19 DIAGNOSIS — E119 Type 2 diabetes mellitus without complications: Secondary | ICD-10-CM | POA: Diagnosis not present

## 2018-05-19 DIAGNOSIS — E785 Hyperlipidemia, unspecified: Secondary | ICD-10-CM | POA: Diagnosis not present

## 2018-05-19 DIAGNOSIS — I10 Essential (primary) hypertension: Secondary | ICD-10-CM | POA: Diagnosis not present

## 2018-05-19 DIAGNOSIS — E43 Unspecified severe protein-calorie malnutrition: Secondary | ICD-10-CM | POA: Diagnosis not present

## 2018-05-19 DIAGNOSIS — G20 Parkinson's disease: Secondary | ICD-10-CM | POA: Diagnosis not present

## 2018-05-20 DIAGNOSIS — E43 Unspecified severe protein-calorie malnutrition: Secondary | ICD-10-CM | POA: Diagnosis not present

## 2018-05-20 DIAGNOSIS — G20 Parkinson's disease: Secondary | ICD-10-CM | POA: Diagnosis not present

## 2018-05-20 DIAGNOSIS — I10 Essential (primary) hypertension: Secondary | ICD-10-CM | POA: Diagnosis not present

## 2018-05-20 DIAGNOSIS — E119 Type 2 diabetes mellitus without complications: Secondary | ICD-10-CM | POA: Diagnosis not present

## 2018-05-20 DIAGNOSIS — R1310 Dysphagia, unspecified: Secondary | ICD-10-CM | POA: Diagnosis not present

## 2018-05-20 DIAGNOSIS — E785 Hyperlipidemia, unspecified: Secondary | ICD-10-CM | POA: Diagnosis not present

## 2018-05-23 ENCOUNTER — Ambulatory Visit: Payer: Self-pay

## 2018-05-23 ENCOUNTER — Encounter: Payer: Self-pay | Admitting: Family

## 2018-05-24 DIAGNOSIS — E43 Unspecified severe protein-calorie malnutrition: Secondary | ICD-10-CM | POA: Diagnosis not present

## 2018-05-24 DIAGNOSIS — G20 Parkinson's disease: Secondary | ICD-10-CM | POA: Diagnosis not present

## 2018-05-24 DIAGNOSIS — I10 Essential (primary) hypertension: Secondary | ICD-10-CM | POA: Diagnosis not present

## 2018-05-24 DIAGNOSIS — E785 Hyperlipidemia, unspecified: Secondary | ICD-10-CM | POA: Diagnosis not present

## 2018-05-24 DIAGNOSIS — E119 Type 2 diabetes mellitus without complications: Secondary | ICD-10-CM | POA: Diagnosis not present

## 2018-05-24 DIAGNOSIS — R1310 Dysphagia, unspecified: Secondary | ICD-10-CM | POA: Diagnosis not present

## 2018-05-25 DIAGNOSIS — I452 Bifascicular block: Secondary | ICD-10-CM | POA: Diagnosis not present

## 2018-05-25 DIAGNOSIS — E039 Hypothyroidism, unspecified: Secondary | ICD-10-CM | POA: Diagnosis not present

## 2018-05-25 DIAGNOSIS — I10 Essential (primary) hypertension: Secondary | ICD-10-CM | POA: Diagnosis not present

## 2018-05-25 DIAGNOSIS — R1310 Dysphagia, unspecified: Secondary | ICD-10-CM | POA: Diagnosis not present

## 2018-05-25 DIAGNOSIS — H04123 Dry eye syndrome of bilateral lacrimal glands: Secondary | ICD-10-CM | POA: Diagnosis not present

## 2018-05-25 DIAGNOSIS — G2581 Restless legs syndrome: Secondary | ICD-10-CM | POA: Diagnosis not present

## 2018-05-25 DIAGNOSIS — E785 Hyperlipidemia, unspecified: Secondary | ICD-10-CM | POA: Diagnosis not present

## 2018-05-25 DIAGNOSIS — N401 Enlarged prostate with lower urinary tract symptoms: Secondary | ICD-10-CM | POA: Diagnosis not present

## 2018-05-25 DIAGNOSIS — E43 Unspecified severe protein-calorie malnutrition: Secondary | ICD-10-CM | POA: Diagnosis not present

## 2018-05-25 DIAGNOSIS — K219 Gastro-esophageal reflux disease without esophagitis: Secondary | ICD-10-CM | POA: Diagnosis not present

## 2018-05-25 DIAGNOSIS — G20 Parkinson's disease: Secondary | ICD-10-CM | POA: Diagnosis not present

## 2018-05-25 DIAGNOSIS — E119 Type 2 diabetes mellitus without complications: Secondary | ICD-10-CM | POA: Diagnosis not present

## 2018-05-26 ENCOUNTER — Ambulatory Visit: Payer: Medicare Other | Admitting: Neurology

## 2018-05-27 DIAGNOSIS — R1310 Dysphagia, unspecified: Secondary | ICD-10-CM | POA: Diagnosis not present

## 2018-05-27 DIAGNOSIS — E785 Hyperlipidemia, unspecified: Secondary | ICD-10-CM | POA: Diagnosis not present

## 2018-05-27 DIAGNOSIS — E43 Unspecified severe protein-calorie malnutrition: Secondary | ICD-10-CM | POA: Diagnosis not present

## 2018-05-27 DIAGNOSIS — G20 Parkinson's disease: Secondary | ICD-10-CM | POA: Diagnosis not present

## 2018-05-27 DIAGNOSIS — I10 Essential (primary) hypertension: Secondary | ICD-10-CM | POA: Diagnosis not present

## 2018-05-27 DIAGNOSIS — E119 Type 2 diabetes mellitus without complications: Secondary | ICD-10-CM | POA: Diagnosis not present

## 2018-06-03 ENCOUNTER — Other Ambulatory Visit: Payer: Self-pay | Admitting: Internal Medicine

## 2018-06-10 DIAGNOSIS — G20 Parkinson's disease: Secondary | ICD-10-CM | POA: Diagnosis not present

## 2018-06-10 DIAGNOSIS — I10 Essential (primary) hypertension: Secondary | ICD-10-CM | POA: Diagnosis not present

## 2018-06-10 DIAGNOSIS — R1310 Dysphagia, unspecified: Secondary | ICD-10-CM | POA: Diagnosis not present

## 2018-06-10 DIAGNOSIS — E119 Type 2 diabetes mellitus without complications: Secondary | ICD-10-CM | POA: Diagnosis not present

## 2018-06-10 DIAGNOSIS — E785 Hyperlipidemia, unspecified: Secondary | ICD-10-CM | POA: Diagnosis not present

## 2018-06-10 DIAGNOSIS — E43 Unspecified severe protein-calorie malnutrition: Secondary | ICD-10-CM | POA: Diagnosis not present

## 2018-06-13 ENCOUNTER — Other Ambulatory Visit: Payer: Self-pay

## 2018-06-13 ENCOUNTER — Ambulatory Visit (INDEPENDENT_AMBULATORY_CARE_PROVIDER_SITE_OTHER): Payer: Medicare Other | Admitting: Nurse Practitioner

## 2018-06-13 ENCOUNTER — Encounter: Payer: Self-pay | Admitting: Nurse Practitioner

## 2018-06-13 DIAGNOSIS — L89153 Pressure ulcer of sacral region, stage 3: Secondary | ICD-10-CM | POA: Diagnosis not present

## 2018-06-13 NOTE — Progress Notes (Signed)
This service is provided via telemedicine  No vital signs collected/recorded due to the encounter was a telemedicine visit.   Location of patient (ex: home, work):  Home  Patient consents to a telephone visit:  Yes  Location of the provider (ex: office, home):  [facility], [facility]   Name of any referring provider:  Kermit Baloeed, Tiffany L, DO  Names of all persons participating in the telemedicine service and their role in the encounter:  S.Chrae B/CMA, Abbey ChattersJessica Lanea Vankirk, NP, and Patient   Time spent on call: 7 min with medical assistant   Virtual Visit via Telephone Note  I connected with Suan Halteravid L Eggert on 06/13/18 at  1:30 PM EDT by telephone and verified that I am speaking with the correct person using two identifiers.   I discussed the limitations, risks, security and privacy concerns of performing an evaluation and management service by telephone and the availability of in person appointments. I also discussed with the patient that there may be a patient responsible charge related to this service. The patient expressed understanding and agreed to proceed.      Careteam: Patient Care Team: Kermit Baloeed, Tiffany L, DO as PCP - General (Geriatric Medicine) Lars MassonNelson, Katarina H, MD as PCP - Cardiology (Cardiology) Tat, Octaviano Battyebecca S, DO as Consulting Physician (Neurology) Ranee GosselinGioffre, Ronald, MD as Consulting Physician (Orthopedic Surgery) Nelson Chimesigby, Donald, MD as Consulting Physician (Ophthalmology) Mardella LaymanPatterson, Ashe R, MD as Consulting Physician (Gastroenterology)  Advanced Directive information    Allergies  Allergen Reactions  . Caffeine Swelling and Other (See Comments)    Reaction:  Joint swelling    Chief Complaint  Patient presents with  . Acute Visit    Bed Rash on buttock. Patient with skin breakdown. Hospice nurse is aware. Televisit      HPI: Patient is a 85 y.o. male for tele-visit. Wife calling because he is having skin breakdown. She has been using mupirocin cream  that she was given for his scrotum.  Hospice was following and coming out to evaluate. Ares are red.  Skin has now piled back. She has been using zinc paste.  Hospice doctor suggested that she start turning him. She is turning him twice daily with pillows.  Has a hospital bed and air mattress. Drinks ensure daily.   Review of Systems:  Review of Systems  Constitutional: Negative for chills and fever.  Skin:       Skin breakdown to sacrum    Past Medical History:  Diagnosis Date  . Abnormality of gait   . Arthritis   . Bifascicular block   . Cyanocobalamin deficiency   . Depression   . Diverticulitis   . Dizzy   . Dysphagia, idiopathic    History of aspiration  . Gait disturbance   . Hearing loss of both ears   . Hyperlipidemia   . Hypertension   . Lumbar back pain   . Memory loss   . Neck pain   . Osteoarthritis   . Osteoporosis   . Other and unspecified hyperlipidemia   . Parkinsonism (HCC)   . Recurrent falls    Using a cane and walker  . Restless leg   . Restless legs syndrome (RLS)   . Right knee pain   . Shoulder pain   . Thoracic back pain   . Thyroid disease   . Type 2 diabetes mellitus with autonomic neuropathy (HCC)   . Unspecified essential hypertension   . Unspecified hypothyroidism   . Vitamin D deficiency  Past Surgical History:  Procedure Laterality Date  . CATARACT EXTRACTION Bilateral 2012   Dr. Hazle Quant   Social History:   reports that he quit smoking about 44 years ago. He has never used smokeless tobacco. He reports that he does not drink alcohol or use drugs.  Family History  Problem Relation Age of Onset  . Heart disease Mother   . Diabetes Mother   . Stroke Mother   . Cancer Father        prostate  . Diabetes Sister   . Heart disease Brother     Medications: Patient's Medications  New Prescriptions   No medications on file  Previous Medications   ACETAMINOPHEN (TYLENOL) 500 MG TABLET    Take 1,000 mg by mouth daily as  needed for mild pain.   ASPIRIN EC 81 MG TABLET    Take 1 tablet (81 mg total) by mouth daily.   BENAZEPRIL (LOTENSIN) 5 MG TABLET    TAKE 1 TABLET BY MOUTH  DAILY   CALCIUM CARBONATE-VITAMIN D (CALCIUM-CARB 600 + D) 600-125 MG-UNIT TABS    Take 1 tablet by mouth 2 (two) times daily.   CARBIDOPA-LEVODOPA (SINEMET IR) 25-100 MG TABLET    TAKE 3 TABLETS BY MOUTH TWO TIMES DAILY   CYANOCOBALAMIN (,VITAMIN B-12,) 1000 MCG/ML INJECTION    INJECT INTO THE MUSCLE EVERY 30 DAYS. (ON THE 15TH)   EQ STOOL SOFTENER/LAXATIVE 8.6-50 MG TABLET    Take 3 by mouth every day at bedtime for constipation   GABAPENTIN (NEURONTIN) 300 MG CAPSULE    TAKE 1 CAPSULE BY MOUTH AT  BEDTIME   GLUCOSE BLOOD (ONE TOUCH ULTRA TEST) TEST STRIP    1 each by Other route 3 (three) times daily. Dx: E11.43   METFORMIN (GLUCOPHAGE) 1000 MG TABLET    TAKE 1 TABLET BY MOUTH TWO  TIMES DAILY WITH MEALS   MIRTAZAPINE (REMERON) 30 MG TABLET    TAKE 1 TABLET BY MOUTH AT  BEDTIME   MUPIROCIN OINTMENT (BACTROBAN) 2 %    APPLY 1 APPLICATION  TOPICALLY 2 (TWO) TIMES  DAILY.   ONETOUCH DELICA LANCETS FINE MISC    Use to check blood glucose three times daily. Dx: E11.43   PANTOPRAZOLE (PROTONIX) 40 MG TABLET    TAKE 1 TABLET BY MOUTH TWO  TIMES DAILY   POLYETHYL GLYCOL-PROPYL GLYCOL (SYSTANE OP)    Place 1 drop into both eyes 2 (two) times daily.   PRAVASTATIN (PRAVACHOL) 20 MG TABLET    TAKE 1 TABLET BY MOUTH AT  BEDTIME   SODIUM PHOSPHATE (FLEET) 7-19 GM/118ML ENEM    Place 1 enema rectally once a week.   TAMSULOSIN (FLOMAX) 0.4 MG CAPS CAPSULE    TAKE 1 CAPSULE BY MOUTH AT  BEDTIME  Modified Medications   No medications on file  Discontinued Medications   AMOXICILLIN-CLAVULANATE (AUGMENTIN) 875-125 MG TABLET    Take 1 tablet by mouth 2 (two) times daily.   CALCIUM POLYCARBOPHIL (FIBERCON PO)    Take 4 tablets by mouth at bedtime.   DICLOFENAC SODIUM (VOLTAREN) 1 % GEL    Apply 4 g topically 4 (four) times daily. To affected knee    PYRIDOXINE (VITAMIN B-6) 100 MG TABLET    Take 100 mg by mouth daily.     Physical Exam: Unable due to televisit.   Labs reviewed: Basic Metabolic Panel: Recent Labs    11/21/17 2020  01/04/18 0814 02/01/18 1834 02/28/18 1146  NA  --    < >  143 142 142  K  --    < > 3.9 3.6 4.2  CL  --    < > 108 102 102  CO2  --    < > 30 28 32  GLUCOSE  --    < > 93 155* 101*  BUN  --    < > 20 20 28*  CREATININE  --    < > 0.91 1.02 1.00  CALCIUM  --    < > 8.5* 9.6 10.2  TSH 0.990  --   --   --   --    < > = values in this interval not displayed.   Liver Function Tests: Recent Labs    11/21/17 1648 01/03/18 2101 02/01/18 1834  AST 27 18 24   ALT 15 <5 9  ALKPHOS 63 57 61  BILITOT 1.1 0.8 0.6  PROT 7.1 6.9 6.7  ALBUMIN 3.9 3.9 3.7   No results for input(s): LIPASE, AMYLASE in the last 8760 hours. No results for input(s): AMMONIA in the last 8760 hours. CBC: Recent Labs    02/01/18 1834 02/28/18 1146 03/08/18 1508  WBC 12.9* 6.8 14.0*  NEUTROABS 10.7* 4,209 11,746*  HGB 14.6 14.5 14.0  HCT 45.9 42.9 41.1  MCV 98.7 94.5 93.2  PLT 251 279 295   Lipid Panel: No results for input(s): CHOL, HDL, LDLCALC, TRIG, CHOLHDL, LDLDIRECT in the last 8760 hours. TSH: Recent Labs    11/21/17 2020  TSH 0.990   A1C: Lab Results  Component Value Date   HGBA1C 5.7 (H) 11/02/2017     Assessment/Plan 1. Pressure injury of sacral region, stage 3 (HCC) Personally called and left message to hospice for them to do a home visit,  to come by and do an evaluation- sounds like they will need to apply mepliex to area to provide a better barrier.  -continue pressure reduction with frequent turns and air mattress -to increase protein if able- can offer 2 ensures but may not be able to tolerate.    Janene Harvey. Biagio Borg  [facility] [phone]    Follow Up Instructions:    I discussed the assessment and treatment plan with the patient. The patient was  provided an opportunity to ask questions and all were answered. The patient agreed with the plan and demonstrated an understanding of the instructions.   The patient was advised to call back or seek an in-person evaluation if the symptoms worsen or if the condition fails to improve as anticipated.  I provided 13 minutes of non-face-to-face time during this encounter.   Sharon Seller, NP

## 2018-06-13 NOTE — Patient Instructions (Addendum)
hospice has been notify to evaluate pressure injury.  Should be turning every 2 hours to reduce pressure to area.  Increase protein in diet. Continue pressure reduction   Pressure Injury  A pressure injury is damage to the skin and underlying tissue that results from pressure being applied to an area of the body. It often affects people who must spend a long time in a bed or chair because of a medical condition. Pressure injuries usually occur:  Over bony parts of the body, such as the tailbone, shoulders, elbows, hips, heels, spine, ankles, and back of the head.  Under medical devices that make contact with the body, such as respiratory equipment, stockings, tubes, and splints. Pressure injuries start as reddened areas on the skin and can lead to pain and an open wound. What are the causes? This condition is caused by frequent or constant pressure to an area of the body. Decreased blood flow to the skin can eventually cause the skin tissue to die and break down, causing a wound. What increases the risk? You are more likely to develop this condition if you:  Are in the hospital or an extended care facility.  Are bedridden or in a wheelchair.  Have an injury or disease that keeps you from: ? Moving normally. ? Feeling pain or pressure.  Have a condition that: ? Makes you sleepy or less alert. ? Causes poor blood flow.  Need to wear a medical device.  Have poor control of your bladder or bowel functions (incontinence).  Have poor nutrition (malnutrition). If you are at risk for pressure injuries, your health care provider may recommend certain types of mattresses, mattress covers, pillows, cushions, or boots to help prevent them. These may include products filled with air, foam, gel, or sand. What are the signs or symptoms? Symptoms of this condition depend on the severity of the injury. Symptoms may include:  Red or dark areas of the skin.  Pain, warmth, or a change of skin  texture.  Blisters.  An open wound. How is this diagnosed? This condition is diagnosed with a medical history and physical exam. You may also have tests, such as:  Blood tests.  Imaging tests.  Blood flow tests. Your pressure injury will be staged based on its severity. Staging is based on:  The depth of the tissue injury, including whether there is exposure of muscle, bone, or tendon.  The cause of the pressure injury. How is this treated? This condition may be treated by:  Relieving or redistributing pressure on your skin. This includes: ? Frequently changing your position. ? Avoiding positions that caused the wound or that can make the wound worse. ? Using specific bed mattresses, chair cushions, or protective boots. ? Moving medical devices from an area of pressure, or placing padding between the skin and the device. ? Using foams, creams, or powders to prevent rubbing (friction) on the skin.  Keeping your skin clean and dry. This may include using a skin cleanser or skin barrier as told by your health care provider.  Cleaning your injury and removing any dead tissue from the wound (debridement).  Placing a bandage (dressing) over your injury.  Using medicines for pain or to prevent or treat infection. Surgery may be needed if other treatments are not working or if your injury is very deep. Follow these instructions at home: Wound care  Follow instructions from your health care provider about how to take care of your wound. Make sure you: ? Wash  your hands with soap and water before and after you change your bandage (dressing). If soap and water are not available, use hand sanitizer. ? Change your dressing as told by your health care provider.  Check your wound every day for signs of infection. Have a caregiver do this for you if you are not able. Check for: ? Redness, swelling, or increased pain. ? More fluid or blood. ? Warmth. ? Pus or a bad smell. Skin  care  Keep your skin clean and dry. Gently pat your skin dry.  Do not rub or massage your skin.  You or a caregiver should check your skin every day for any changes in color or any new blisters or sores (ulcers). Medicines  Take over-the-counter and prescription medicines only as told by your health care provider.  If you were prescribed an antibiotic medicine, take or apply it as told by your health care provider. Do not stop using the antibiotic even if your condition improves. Reducing and redistributing pressure  Do not lie or sit in one position for a long time. Move or change position every 1-2 hours, or as told by your health care provider.  Use pillows or cushions to reduce pressure. Ask your health care provider to recommend cushions or pads for you. General instructions   Eat a healthy diet that includes lots of protein.  Drink enough fluid to keep your urine pale yellow.  Be as active as you can every day. Ask your health care provider to suggest safe exercises or activities.  Do not abuse drugs or alcohol.  Do not use any products that contain nicotine or tobacco, such as cigarettes, e-cigarettes, and chewing tobacco. If you need help quitting, ask your health care provider.  Keep all follow-up visits as told by your health care provider. This is important. Contact a health care provider if:  You have: ? A fever or chills. ? Pain that is not helped by medicine. ? Any changes in skin color. ? New blisters or sores. ? Pus or a bad smell coming from your wound. ? Redness, swelling, or pain around your wound. ? More fluid or blood coming from your wound.  Your wound does not improve after 1-2 weeks of treatment. Summary  A pressure injury is damage to the skin and underlying tissue that results from pressure being applied to an area of the body.  Do not lie or sit in one position for a long time. Your health care provider may advise you to move or change  position every 1-2 hours.  Follow instructions from your health care provider about how to take care of your wound.  Keep all follow-up visits as told by your health care provider. This is important. This information is not intended to replace advice given to you by your health care provider. Make sure you discuss any questions you have with your health care provider. Document Released: 02/09/2005 Document Revised: 09/08/2017 Document Reviewed: 09/08/2017 Elsevier Interactive Patient Education  [employer].

## 2018-06-24 DIAGNOSIS — G20 Parkinson's disease: Secondary | ICD-10-CM | POA: Diagnosis not present

## 2018-06-24 DIAGNOSIS — E43 Unspecified severe protein-calorie malnutrition: Secondary | ICD-10-CM | POA: Diagnosis not present

## 2018-06-24 DIAGNOSIS — N400 Enlarged prostate without lower urinary tract symptoms: Secondary | ICD-10-CM | POA: Diagnosis not present

## 2018-06-24 DIAGNOSIS — E119 Type 2 diabetes mellitus without complications: Secondary | ICD-10-CM | POA: Diagnosis not present

## 2018-06-24 DIAGNOSIS — K219 Gastro-esophageal reflux disease without esophagitis: Secondary | ICD-10-CM | POA: Diagnosis not present

## 2018-06-24 DIAGNOSIS — E785 Hyperlipidemia, unspecified: Secondary | ICD-10-CM | POA: Diagnosis not present

## 2018-06-24 DIAGNOSIS — I452 Bifascicular block: Secondary | ICD-10-CM | POA: Diagnosis not present

## 2018-06-24 DIAGNOSIS — R1310 Dysphagia, unspecified: Secondary | ICD-10-CM | POA: Diagnosis not present

## 2018-06-24 DIAGNOSIS — H04123 Dry eye syndrome of bilateral lacrimal glands: Secondary | ICD-10-CM | POA: Diagnosis not present

## 2018-06-24 DIAGNOSIS — G2581 Restless legs syndrome: Secondary | ICD-10-CM | POA: Diagnosis not present

## 2018-06-24 DIAGNOSIS — I10 Essential (primary) hypertension: Secondary | ICD-10-CM | POA: Diagnosis not present

## 2018-06-24 DIAGNOSIS — E039 Hypothyroidism, unspecified: Secondary | ICD-10-CM | POA: Diagnosis not present

## 2018-07-05 DIAGNOSIS — E119 Type 2 diabetes mellitus without complications: Secondary | ICD-10-CM | POA: Diagnosis not present

## 2018-07-05 DIAGNOSIS — I10 Essential (primary) hypertension: Secondary | ICD-10-CM | POA: Diagnosis not present

## 2018-07-05 DIAGNOSIS — G20 Parkinson's disease: Secondary | ICD-10-CM | POA: Diagnosis not present

## 2018-07-05 DIAGNOSIS — E43 Unspecified severe protein-calorie malnutrition: Secondary | ICD-10-CM | POA: Diagnosis not present

## 2018-07-05 DIAGNOSIS — I452 Bifascicular block: Secondary | ICD-10-CM | POA: Diagnosis not present

## 2018-07-05 DIAGNOSIS — R1310 Dysphagia, unspecified: Secondary | ICD-10-CM | POA: Diagnosis not present

## 2018-07-07 DIAGNOSIS — E43 Unspecified severe protein-calorie malnutrition: Secondary | ICD-10-CM | POA: Diagnosis not present

## 2018-07-07 DIAGNOSIS — R1310 Dysphagia, unspecified: Secondary | ICD-10-CM | POA: Diagnosis not present

## 2018-07-07 DIAGNOSIS — I10 Essential (primary) hypertension: Secondary | ICD-10-CM | POA: Diagnosis not present

## 2018-07-07 DIAGNOSIS — G20 Parkinson's disease: Secondary | ICD-10-CM | POA: Diagnosis not present

## 2018-07-07 DIAGNOSIS — E119 Type 2 diabetes mellitus without complications: Secondary | ICD-10-CM | POA: Diagnosis not present

## 2018-07-07 DIAGNOSIS — I452 Bifascicular block: Secondary | ICD-10-CM | POA: Diagnosis not present

## 2018-07-14 DIAGNOSIS — R1310 Dysphagia, unspecified: Secondary | ICD-10-CM | POA: Diagnosis not present

## 2018-07-14 DIAGNOSIS — G20 Parkinson's disease: Secondary | ICD-10-CM | POA: Diagnosis not present

## 2018-07-14 DIAGNOSIS — E43 Unspecified severe protein-calorie malnutrition: Secondary | ICD-10-CM | POA: Diagnosis not present

## 2018-07-14 DIAGNOSIS — E119 Type 2 diabetes mellitus without complications: Secondary | ICD-10-CM | POA: Diagnosis not present

## 2018-07-14 DIAGNOSIS — I10 Essential (primary) hypertension: Secondary | ICD-10-CM | POA: Diagnosis not present

## 2018-07-14 DIAGNOSIS — I452 Bifascicular block: Secondary | ICD-10-CM | POA: Diagnosis not present

## 2018-07-17 ENCOUNTER — Other Ambulatory Visit: Payer: Self-pay | Admitting: Internal Medicine

## 2018-07-19 DIAGNOSIS — I10 Essential (primary) hypertension: Secondary | ICD-10-CM | POA: Diagnosis not present

## 2018-07-19 DIAGNOSIS — I452 Bifascicular block: Secondary | ICD-10-CM | POA: Diagnosis not present

## 2018-07-19 DIAGNOSIS — G20 Parkinson's disease: Secondary | ICD-10-CM | POA: Diagnosis not present

## 2018-07-19 DIAGNOSIS — R1310 Dysphagia, unspecified: Secondary | ICD-10-CM | POA: Diagnosis not present

## 2018-07-19 DIAGNOSIS — E119 Type 2 diabetes mellitus without complications: Secondary | ICD-10-CM | POA: Diagnosis not present

## 2018-07-19 DIAGNOSIS — E43 Unspecified severe protein-calorie malnutrition: Secondary | ICD-10-CM | POA: Diagnosis not present

## 2018-07-22 DIAGNOSIS — E119 Type 2 diabetes mellitus without complications: Secondary | ICD-10-CM | POA: Diagnosis not present

## 2018-07-22 DIAGNOSIS — R1310 Dysphagia, unspecified: Secondary | ICD-10-CM | POA: Diagnosis not present

## 2018-07-22 DIAGNOSIS — E43 Unspecified severe protein-calorie malnutrition: Secondary | ICD-10-CM | POA: Diagnosis not present

## 2018-07-22 DIAGNOSIS — G20 Parkinson's disease: Secondary | ICD-10-CM | POA: Diagnosis not present

## 2018-07-22 DIAGNOSIS — I452 Bifascicular block: Secondary | ICD-10-CM | POA: Diagnosis not present

## 2018-07-22 DIAGNOSIS — I10 Essential (primary) hypertension: Secondary | ICD-10-CM | POA: Diagnosis not present

## 2018-07-25 DIAGNOSIS — K219 Gastro-esophageal reflux disease without esophagitis: Secondary | ICD-10-CM | POA: Diagnosis not present

## 2018-07-25 DIAGNOSIS — R1310 Dysphagia, unspecified: Secondary | ICD-10-CM | POA: Diagnosis not present

## 2018-07-25 DIAGNOSIS — H04123 Dry eye syndrome of bilateral lacrimal glands: Secondary | ICD-10-CM | POA: Diagnosis not present

## 2018-07-25 DIAGNOSIS — E119 Type 2 diabetes mellitus without complications: Secondary | ICD-10-CM | POA: Diagnosis not present

## 2018-07-25 DIAGNOSIS — G2581 Restless legs syndrome: Secondary | ICD-10-CM | POA: Diagnosis not present

## 2018-07-25 DIAGNOSIS — E039 Hypothyroidism, unspecified: Secondary | ICD-10-CM | POA: Diagnosis not present

## 2018-07-25 DIAGNOSIS — E43 Unspecified severe protein-calorie malnutrition: Secondary | ICD-10-CM | POA: Diagnosis not present

## 2018-07-25 DIAGNOSIS — G20 Parkinson's disease: Secondary | ICD-10-CM | POA: Diagnosis not present

## 2018-07-25 DIAGNOSIS — I10 Essential (primary) hypertension: Secondary | ICD-10-CM | POA: Diagnosis not present

## 2018-07-25 DIAGNOSIS — E785 Hyperlipidemia, unspecified: Secondary | ICD-10-CM | POA: Diagnosis not present

## 2018-07-25 DIAGNOSIS — N400 Enlarged prostate without lower urinary tract symptoms: Secondary | ICD-10-CM | POA: Diagnosis not present

## 2018-07-25 DIAGNOSIS — I452 Bifascicular block: Secondary | ICD-10-CM | POA: Diagnosis not present

## 2018-07-26 DIAGNOSIS — I10 Essential (primary) hypertension: Secondary | ICD-10-CM | POA: Diagnosis not present

## 2018-07-26 DIAGNOSIS — R1310 Dysphagia, unspecified: Secondary | ICD-10-CM | POA: Diagnosis not present

## 2018-07-26 DIAGNOSIS — E119 Type 2 diabetes mellitus without complications: Secondary | ICD-10-CM | POA: Diagnosis not present

## 2018-07-26 DIAGNOSIS — I452 Bifascicular block: Secondary | ICD-10-CM | POA: Diagnosis not present

## 2018-07-26 DIAGNOSIS — G20 Parkinson's disease: Secondary | ICD-10-CM | POA: Diagnosis not present

## 2018-07-26 DIAGNOSIS — E43 Unspecified severe protein-calorie malnutrition: Secondary | ICD-10-CM | POA: Diagnosis not present

## 2018-07-29 DIAGNOSIS — R1310 Dysphagia, unspecified: Secondary | ICD-10-CM | POA: Diagnosis not present

## 2018-07-29 DIAGNOSIS — E119 Type 2 diabetes mellitus without complications: Secondary | ICD-10-CM | POA: Diagnosis not present

## 2018-07-29 DIAGNOSIS — I452 Bifascicular block: Secondary | ICD-10-CM | POA: Diagnosis not present

## 2018-07-29 DIAGNOSIS — I10 Essential (primary) hypertension: Secondary | ICD-10-CM | POA: Diagnosis not present

## 2018-07-29 DIAGNOSIS — G20 Parkinson's disease: Secondary | ICD-10-CM | POA: Diagnosis not present

## 2018-07-29 DIAGNOSIS — E43 Unspecified severe protein-calorie malnutrition: Secondary | ICD-10-CM | POA: Diagnosis not present

## 2018-08-02 DIAGNOSIS — G20 Parkinson's disease: Secondary | ICD-10-CM | POA: Diagnosis not present

## 2018-08-02 DIAGNOSIS — I452 Bifascicular block: Secondary | ICD-10-CM | POA: Diagnosis not present

## 2018-08-02 DIAGNOSIS — E119 Type 2 diabetes mellitus without complications: Secondary | ICD-10-CM | POA: Diagnosis not present

## 2018-08-02 DIAGNOSIS — I10 Essential (primary) hypertension: Secondary | ICD-10-CM | POA: Diagnosis not present

## 2018-08-02 DIAGNOSIS — E43 Unspecified severe protein-calorie malnutrition: Secondary | ICD-10-CM | POA: Diagnosis not present

## 2018-08-02 DIAGNOSIS — R1310 Dysphagia, unspecified: Secondary | ICD-10-CM | POA: Diagnosis not present

## 2018-08-04 ENCOUNTER — Other Ambulatory Visit: Payer: Self-pay | Admitting: Internal Medicine

## 2018-08-05 DIAGNOSIS — I10 Essential (primary) hypertension: Secondary | ICD-10-CM | POA: Diagnosis not present

## 2018-08-05 DIAGNOSIS — R1310 Dysphagia, unspecified: Secondary | ICD-10-CM | POA: Diagnosis not present

## 2018-08-05 DIAGNOSIS — E43 Unspecified severe protein-calorie malnutrition: Secondary | ICD-10-CM | POA: Diagnosis not present

## 2018-08-05 DIAGNOSIS — E119 Type 2 diabetes mellitus without complications: Secondary | ICD-10-CM | POA: Diagnosis not present

## 2018-08-05 DIAGNOSIS — I452 Bifascicular block: Secondary | ICD-10-CM | POA: Diagnosis not present

## 2018-08-05 DIAGNOSIS — G20 Parkinson's disease: Secondary | ICD-10-CM | POA: Diagnosis not present

## 2018-08-09 DIAGNOSIS — I452 Bifascicular block: Secondary | ICD-10-CM | POA: Diagnosis not present

## 2018-08-09 DIAGNOSIS — E119 Type 2 diabetes mellitus without complications: Secondary | ICD-10-CM | POA: Diagnosis not present

## 2018-08-09 DIAGNOSIS — E43 Unspecified severe protein-calorie malnutrition: Secondary | ICD-10-CM | POA: Diagnosis not present

## 2018-08-09 DIAGNOSIS — R1310 Dysphagia, unspecified: Secondary | ICD-10-CM | POA: Diagnosis not present

## 2018-08-09 DIAGNOSIS — G20 Parkinson's disease: Secondary | ICD-10-CM | POA: Diagnosis not present

## 2018-08-09 DIAGNOSIS — I10 Essential (primary) hypertension: Secondary | ICD-10-CM | POA: Diagnosis not present

## 2018-08-15 DIAGNOSIS — E43 Unspecified severe protein-calorie malnutrition: Secondary | ICD-10-CM | POA: Diagnosis not present

## 2018-08-15 DIAGNOSIS — G20 Parkinson's disease: Secondary | ICD-10-CM | POA: Diagnosis not present

## 2018-08-15 DIAGNOSIS — E119 Type 2 diabetes mellitus without complications: Secondary | ICD-10-CM | POA: Diagnosis not present

## 2018-08-15 DIAGNOSIS — I10 Essential (primary) hypertension: Secondary | ICD-10-CM | POA: Diagnosis not present

## 2018-08-15 DIAGNOSIS — R1310 Dysphagia, unspecified: Secondary | ICD-10-CM | POA: Diagnosis not present

## 2018-08-15 DIAGNOSIS — I452 Bifascicular block: Secondary | ICD-10-CM | POA: Diagnosis not present

## 2018-08-16 DIAGNOSIS — R1310 Dysphagia, unspecified: Secondary | ICD-10-CM | POA: Diagnosis not present

## 2018-08-16 DIAGNOSIS — I10 Essential (primary) hypertension: Secondary | ICD-10-CM | POA: Diagnosis not present

## 2018-08-16 DIAGNOSIS — I452 Bifascicular block: Secondary | ICD-10-CM | POA: Diagnosis not present

## 2018-08-16 DIAGNOSIS — G20 Parkinson's disease: Secondary | ICD-10-CM | POA: Diagnosis not present

## 2018-08-16 DIAGNOSIS — E119 Type 2 diabetes mellitus without complications: Secondary | ICD-10-CM | POA: Diagnosis not present

## 2018-08-16 DIAGNOSIS — E43 Unspecified severe protein-calorie malnutrition: Secondary | ICD-10-CM | POA: Diagnosis not present

## 2018-08-19 DIAGNOSIS — I10 Essential (primary) hypertension: Secondary | ICD-10-CM | POA: Diagnosis not present

## 2018-08-19 DIAGNOSIS — E43 Unspecified severe protein-calorie malnutrition: Secondary | ICD-10-CM | POA: Diagnosis not present

## 2018-08-19 DIAGNOSIS — I452 Bifascicular block: Secondary | ICD-10-CM | POA: Diagnosis not present

## 2018-08-19 DIAGNOSIS — R1310 Dysphagia, unspecified: Secondary | ICD-10-CM | POA: Diagnosis not present

## 2018-08-19 DIAGNOSIS — G20 Parkinson's disease: Secondary | ICD-10-CM | POA: Diagnosis not present

## 2018-08-19 DIAGNOSIS — E119 Type 2 diabetes mellitus without complications: Secondary | ICD-10-CM | POA: Diagnosis not present

## 2018-08-22 DIAGNOSIS — G20 Parkinson's disease: Secondary | ICD-10-CM | POA: Diagnosis not present

## 2018-08-22 DIAGNOSIS — I10 Essential (primary) hypertension: Secondary | ICD-10-CM | POA: Diagnosis not present

## 2018-08-22 DIAGNOSIS — I452 Bifascicular block: Secondary | ICD-10-CM | POA: Diagnosis not present

## 2018-08-22 DIAGNOSIS — E119 Type 2 diabetes mellitus without complications: Secondary | ICD-10-CM | POA: Diagnosis not present

## 2018-08-22 DIAGNOSIS — R1310 Dysphagia, unspecified: Secondary | ICD-10-CM | POA: Diagnosis not present

## 2018-08-22 DIAGNOSIS — E43 Unspecified severe protein-calorie malnutrition: Secondary | ICD-10-CM | POA: Diagnosis not present

## 2018-08-23 DIAGNOSIS — E119 Type 2 diabetes mellitus without complications: Secondary | ICD-10-CM | POA: Diagnosis not present

## 2018-08-23 DIAGNOSIS — G20 Parkinson's disease: Secondary | ICD-10-CM | POA: Diagnosis not present

## 2018-08-23 DIAGNOSIS — E43 Unspecified severe protein-calorie malnutrition: Secondary | ICD-10-CM | POA: Diagnosis not present

## 2018-08-23 DIAGNOSIS — I10 Essential (primary) hypertension: Secondary | ICD-10-CM | POA: Diagnosis not present

## 2018-08-23 DIAGNOSIS — I452 Bifascicular block: Secondary | ICD-10-CM | POA: Diagnosis not present

## 2018-08-23 DIAGNOSIS — R1310 Dysphagia, unspecified: Secondary | ICD-10-CM | POA: Diagnosis not present

## 2018-08-24 DIAGNOSIS — I10 Essential (primary) hypertension: Secondary | ICD-10-CM | POA: Diagnosis not present

## 2018-08-24 DIAGNOSIS — N400 Enlarged prostate without lower urinary tract symptoms: Secondary | ICD-10-CM | POA: Diagnosis not present

## 2018-08-24 DIAGNOSIS — E119 Type 2 diabetes mellitus without complications: Secondary | ICD-10-CM | POA: Diagnosis not present

## 2018-08-24 DIAGNOSIS — G2581 Restless legs syndrome: Secondary | ICD-10-CM | POA: Diagnosis not present

## 2018-08-24 DIAGNOSIS — E039 Hypothyroidism, unspecified: Secondary | ICD-10-CM | POA: Diagnosis not present

## 2018-08-24 DIAGNOSIS — H04123 Dry eye syndrome of bilateral lacrimal glands: Secondary | ICD-10-CM | POA: Diagnosis not present

## 2018-08-24 DIAGNOSIS — G20 Parkinson's disease: Secondary | ICD-10-CM | POA: Diagnosis not present

## 2018-08-24 DIAGNOSIS — R1310 Dysphagia, unspecified: Secondary | ICD-10-CM | POA: Diagnosis not present

## 2018-08-24 DIAGNOSIS — K219 Gastro-esophageal reflux disease without esophagitis: Secondary | ICD-10-CM | POA: Diagnosis not present

## 2018-08-24 DIAGNOSIS — E785 Hyperlipidemia, unspecified: Secondary | ICD-10-CM | POA: Diagnosis not present

## 2018-08-24 DIAGNOSIS — I452 Bifascicular block: Secondary | ICD-10-CM | POA: Diagnosis not present

## 2018-08-24 DIAGNOSIS — E43 Unspecified severe protein-calorie malnutrition: Secondary | ICD-10-CM | POA: Diagnosis not present

## 2018-08-26 DIAGNOSIS — E43 Unspecified severe protein-calorie malnutrition: Secondary | ICD-10-CM | POA: Diagnosis not present

## 2018-08-26 DIAGNOSIS — I10 Essential (primary) hypertension: Secondary | ICD-10-CM | POA: Diagnosis not present

## 2018-08-26 DIAGNOSIS — G20 Parkinson's disease: Secondary | ICD-10-CM | POA: Diagnosis not present

## 2018-08-26 DIAGNOSIS — E119 Type 2 diabetes mellitus without complications: Secondary | ICD-10-CM | POA: Diagnosis not present

## 2018-08-26 DIAGNOSIS — I452 Bifascicular block: Secondary | ICD-10-CM | POA: Diagnosis not present

## 2018-08-26 DIAGNOSIS — R1310 Dysphagia, unspecified: Secondary | ICD-10-CM | POA: Diagnosis not present

## 2018-08-29 ENCOUNTER — Ambulatory Visit: Payer: Medicare Other | Admitting: Internal Medicine

## 2018-08-30 DIAGNOSIS — R1310 Dysphagia, unspecified: Secondary | ICD-10-CM | POA: Diagnosis not present

## 2018-08-30 DIAGNOSIS — E43 Unspecified severe protein-calorie malnutrition: Secondary | ICD-10-CM | POA: Diagnosis not present

## 2018-08-30 DIAGNOSIS — G20 Parkinson's disease: Secondary | ICD-10-CM | POA: Diagnosis not present

## 2018-08-30 DIAGNOSIS — E119 Type 2 diabetes mellitus without complications: Secondary | ICD-10-CM | POA: Diagnosis not present

## 2018-08-30 DIAGNOSIS — I452 Bifascicular block: Secondary | ICD-10-CM | POA: Diagnosis not present

## 2018-08-30 DIAGNOSIS — I10 Essential (primary) hypertension: Secondary | ICD-10-CM | POA: Diagnosis not present

## 2018-09-01 ENCOUNTER — Other Ambulatory Visit: Payer: Self-pay

## 2018-09-01 NOTE — Patient Outreach (Signed)
[facility]) Care Management  09/01/2018  Nicholas Barnett 01/15/1932 [id number]   Referral received from [place] team for complex case management. Successful outreach with Nicholas Barnett daughter, Nicholas Barnett. She reports that he remains in the home but has transitioned to [facility]  PLAN -Will complete case closure.   [name] [phone]

## 2018-09-02 DIAGNOSIS — I452 Bifascicular block: Secondary | ICD-10-CM | POA: Diagnosis not present

## 2018-09-02 DIAGNOSIS — R1310 Dysphagia, unspecified: Secondary | ICD-10-CM | POA: Diagnosis not present

## 2018-09-02 DIAGNOSIS — I10 Essential (primary) hypertension: Secondary | ICD-10-CM | POA: Diagnosis not present

## 2018-09-02 DIAGNOSIS — G20 Parkinson's disease: Secondary | ICD-10-CM | POA: Diagnosis not present

## 2018-09-02 DIAGNOSIS — E119 Type 2 diabetes mellitus without complications: Secondary | ICD-10-CM | POA: Diagnosis not present

## 2018-09-02 DIAGNOSIS — E43 Unspecified severe protein-calorie malnutrition: Secondary | ICD-10-CM | POA: Diagnosis not present

## 2018-09-06 DIAGNOSIS — E119 Type 2 diabetes mellitus without complications: Secondary | ICD-10-CM | POA: Diagnosis not present

## 2018-09-06 DIAGNOSIS — E43 Unspecified severe protein-calorie malnutrition: Secondary | ICD-10-CM | POA: Diagnosis not present

## 2018-09-06 DIAGNOSIS — I10 Essential (primary) hypertension: Secondary | ICD-10-CM | POA: Diagnosis not present

## 2018-09-06 DIAGNOSIS — G20 Parkinson's disease: Secondary | ICD-10-CM | POA: Diagnosis not present

## 2018-09-06 DIAGNOSIS — I452 Bifascicular block: Secondary | ICD-10-CM | POA: Diagnosis not present

## 2018-09-06 DIAGNOSIS — R1310 Dysphagia, unspecified: Secondary | ICD-10-CM | POA: Diagnosis not present

## 2018-09-08 DIAGNOSIS — I10 Essential (primary) hypertension: Secondary | ICD-10-CM | POA: Diagnosis not present

## 2018-09-08 DIAGNOSIS — G20 Parkinson's disease: Secondary | ICD-10-CM | POA: Diagnosis not present

## 2018-09-08 DIAGNOSIS — E43 Unspecified severe protein-calorie malnutrition: Secondary | ICD-10-CM | POA: Diagnosis not present

## 2018-09-08 DIAGNOSIS — I452 Bifascicular block: Secondary | ICD-10-CM | POA: Diagnosis not present

## 2018-09-08 DIAGNOSIS — E119 Type 2 diabetes mellitus without complications: Secondary | ICD-10-CM | POA: Diagnosis not present

## 2018-09-08 DIAGNOSIS — R1310 Dysphagia, unspecified: Secondary | ICD-10-CM | POA: Diagnosis not present

## 2018-09-09 DIAGNOSIS — E119 Type 2 diabetes mellitus without complications: Secondary | ICD-10-CM | POA: Diagnosis not present

## 2018-09-09 DIAGNOSIS — I452 Bifascicular block: Secondary | ICD-10-CM | POA: Diagnosis not present

## 2018-09-09 DIAGNOSIS — G20 Parkinson's disease: Secondary | ICD-10-CM | POA: Diagnosis not present

## 2018-09-09 DIAGNOSIS — E43 Unspecified severe protein-calorie malnutrition: Secondary | ICD-10-CM | POA: Diagnosis not present

## 2018-09-09 DIAGNOSIS — R1310 Dysphagia, unspecified: Secondary | ICD-10-CM | POA: Diagnosis not present

## 2018-09-09 DIAGNOSIS — I10 Essential (primary) hypertension: Secondary | ICD-10-CM | POA: Diagnosis not present

## 2018-09-11 DIAGNOSIS — I10 Essential (primary) hypertension: Secondary | ICD-10-CM | POA: Diagnosis not present

## 2018-09-11 DIAGNOSIS — I452 Bifascicular block: Secondary | ICD-10-CM | POA: Diagnosis not present

## 2018-09-11 DIAGNOSIS — G20 Parkinson's disease: Secondary | ICD-10-CM | POA: Diagnosis not present

## 2018-09-11 DIAGNOSIS — E43 Unspecified severe protein-calorie malnutrition: Secondary | ICD-10-CM | POA: Diagnosis not present

## 2018-09-11 DIAGNOSIS — E119 Type 2 diabetes mellitus without complications: Secondary | ICD-10-CM | POA: Diagnosis not present

## 2018-09-11 DIAGNOSIS — R1310 Dysphagia, unspecified: Secondary | ICD-10-CM | POA: Diagnosis not present

## 2018-09-13 DIAGNOSIS — I10 Essential (primary) hypertension: Secondary | ICD-10-CM | POA: Diagnosis not present

## 2018-09-13 DIAGNOSIS — E43 Unspecified severe protein-calorie malnutrition: Secondary | ICD-10-CM | POA: Diagnosis not present

## 2018-09-13 DIAGNOSIS — I452 Bifascicular block: Secondary | ICD-10-CM | POA: Diagnosis not present

## 2018-09-13 DIAGNOSIS — E119 Type 2 diabetes mellitus without complications: Secondary | ICD-10-CM | POA: Diagnosis not present

## 2018-09-13 DIAGNOSIS — R1310 Dysphagia, unspecified: Secondary | ICD-10-CM | POA: Diagnosis not present

## 2018-09-13 DIAGNOSIS — G20 Parkinson's disease: Secondary | ICD-10-CM | POA: Diagnosis not present

## 2018-09-14 DIAGNOSIS — I452 Bifascicular block: Secondary | ICD-10-CM | POA: Diagnosis not present

## 2018-09-14 DIAGNOSIS — E119 Type 2 diabetes mellitus without complications: Secondary | ICD-10-CM | POA: Diagnosis not present

## 2018-09-14 DIAGNOSIS — R1310 Dysphagia, unspecified: Secondary | ICD-10-CM | POA: Diagnosis not present

## 2018-09-14 DIAGNOSIS — G20 Parkinson's disease: Secondary | ICD-10-CM | POA: Diagnosis not present

## 2018-09-14 DIAGNOSIS — E43 Unspecified severe protein-calorie malnutrition: Secondary | ICD-10-CM | POA: Diagnosis not present

## 2018-09-14 DIAGNOSIS — I10 Essential (primary) hypertension: Secondary | ICD-10-CM | POA: Diagnosis not present

## 2018-09-15 DIAGNOSIS — E119 Type 2 diabetes mellitus without complications: Secondary | ICD-10-CM | POA: Diagnosis not present

## 2018-09-15 DIAGNOSIS — R1310 Dysphagia, unspecified: Secondary | ICD-10-CM | POA: Diagnosis not present

## 2018-09-15 DIAGNOSIS — E43 Unspecified severe protein-calorie malnutrition: Secondary | ICD-10-CM | POA: Diagnosis not present

## 2018-09-15 DIAGNOSIS — I452 Bifascicular block: Secondary | ICD-10-CM | POA: Diagnosis not present

## 2018-09-15 DIAGNOSIS — G20 Parkinson's disease: Secondary | ICD-10-CM | POA: Diagnosis not present

## 2018-09-15 DIAGNOSIS — I10 Essential (primary) hypertension: Secondary | ICD-10-CM | POA: Diagnosis not present

## 2018-09-16 DIAGNOSIS — I10 Essential (primary) hypertension: Secondary | ICD-10-CM | POA: Diagnosis not present

## 2018-09-16 DIAGNOSIS — G20 Parkinson's disease: Secondary | ICD-10-CM | POA: Diagnosis not present

## 2018-09-16 DIAGNOSIS — I452 Bifascicular block: Secondary | ICD-10-CM | POA: Diagnosis not present

## 2018-09-16 DIAGNOSIS — E119 Type 2 diabetes mellitus without complications: Secondary | ICD-10-CM | POA: Diagnosis not present

## 2018-09-16 DIAGNOSIS — E43 Unspecified severe protein-calorie malnutrition: Secondary | ICD-10-CM | POA: Diagnosis not present

## 2018-09-16 DIAGNOSIS — R1310 Dysphagia, unspecified: Secondary | ICD-10-CM | POA: Diagnosis not present

## 2018-09-19 DIAGNOSIS — I452 Bifascicular block: Secondary | ICD-10-CM | POA: Diagnosis not present

## 2018-09-19 DIAGNOSIS — E119 Type 2 diabetes mellitus without complications: Secondary | ICD-10-CM | POA: Diagnosis not present

## 2018-09-19 DIAGNOSIS — I10 Essential (primary) hypertension: Secondary | ICD-10-CM | POA: Diagnosis not present

## 2018-09-19 DIAGNOSIS — R1310 Dysphagia, unspecified: Secondary | ICD-10-CM | POA: Diagnosis not present

## 2018-09-19 DIAGNOSIS — G20 Parkinson's disease: Secondary | ICD-10-CM | POA: Diagnosis not present

## 2018-09-19 DIAGNOSIS — E43 Unspecified severe protein-calorie malnutrition: Secondary | ICD-10-CM | POA: Diagnosis not present

## 2018-09-20 DIAGNOSIS — G20 Parkinson's disease: Secondary | ICD-10-CM | POA: Diagnosis not present

## 2018-09-20 DIAGNOSIS — E43 Unspecified severe protein-calorie malnutrition: Secondary | ICD-10-CM | POA: Diagnosis not present

## 2018-09-20 DIAGNOSIS — I452 Bifascicular block: Secondary | ICD-10-CM | POA: Diagnosis not present

## 2018-09-20 DIAGNOSIS — I10 Essential (primary) hypertension: Secondary | ICD-10-CM | POA: Diagnosis not present

## 2018-09-20 DIAGNOSIS — E119 Type 2 diabetes mellitus without complications: Secondary | ICD-10-CM | POA: Diagnosis not present

## 2018-09-20 DIAGNOSIS — R1310 Dysphagia, unspecified: Secondary | ICD-10-CM | POA: Diagnosis not present

## 2018-09-21 DIAGNOSIS — E119 Type 2 diabetes mellitus without complications: Secondary | ICD-10-CM | POA: Diagnosis not present

## 2018-09-21 DIAGNOSIS — I452 Bifascicular block: Secondary | ICD-10-CM | POA: Diagnosis not present

## 2018-09-21 DIAGNOSIS — I10 Essential (primary) hypertension: Secondary | ICD-10-CM | POA: Diagnosis not present

## 2018-09-21 DIAGNOSIS — R1310 Dysphagia, unspecified: Secondary | ICD-10-CM | POA: Diagnosis not present

## 2018-09-21 DIAGNOSIS — E43 Unspecified severe protein-calorie malnutrition: Secondary | ICD-10-CM | POA: Diagnosis not present

## 2018-09-21 DIAGNOSIS — G20 Parkinson's disease: Secondary | ICD-10-CM | POA: Diagnosis not present

## 2018-09-22 DIAGNOSIS — E119 Type 2 diabetes mellitus without complications: Secondary | ICD-10-CM | POA: Diagnosis not present

## 2018-09-22 DIAGNOSIS — E43 Unspecified severe protein-calorie malnutrition: Secondary | ICD-10-CM | POA: Diagnosis not present

## 2018-09-22 DIAGNOSIS — I452 Bifascicular block: Secondary | ICD-10-CM | POA: Diagnosis not present

## 2018-09-22 DIAGNOSIS — R1310 Dysphagia, unspecified: Secondary | ICD-10-CM | POA: Diagnosis not present

## 2018-09-22 DIAGNOSIS — G20 Parkinson's disease: Secondary | ICD-10-CM | POA: Diagnosis not present

## 2018-09-22 DIAGNOSIS — I10 Essential (primary) hypertension: Secondary | ICD-10-CM | POA: Diagnosis not present

## 2018-09-22 NOTE — Telephone Encounter (Signed)
This encounter was created in error - please disregard.

## 2018-09-23 DIAGNOSIS — I452 Bifascicular block: Secondary | ICD-10-CM | POA: Diagnosis not present

## 2018-09-23 DIAGNOSIS — E119 Type 2 diabetes mellitus without complications: Secondary | ICD-10-CM | POA: Diagnosis not present

## 2018-09-23 DIAGNOSIS — R1310 Dysphagia, unspecified: Secondary | ICD-10-CM | POA: Diagnosis not present

## 2018-09-23 DIAGNOSIS — E43 Unspecified severe protein-calorie malnutrition: Secondary | ICD-10-CM | POA: Diagnosis not present

## 2018-09-23 DIAGNOSIS — I10 Essential (primary) hypertension: Secondary | ICD-10-CM | POA: Diagnosis not present

## 2018-09-23 DIAGNOSIS — G20 Parkinson's disease: Secondary | ICD-10-CM | POA: Diagnosis not present

## 2018-09-24 DIAGNOSIS — H04123 Dry eye syndrome of bilateral lacrimal glands: Secondary | ICD-10-CM | POA: Diagnosis not present

## 2018-09-24 DIAGNOSIS — Z7401 Bed confinement status: Secondary | ICD-10-CM | POA: Diagnosis not present

## 2018-09-24 DIAGNOSIS — L89152 Pressure ulcer of sacral region, stage 2: Secondary | ICD-10-CM | POA: Diagnosis not present

## 2018-09-24 DIAGNOSIS — E785 Hyperlipidemia, unspecified: Secondary | ICD-10-CM | POA: Diagnosis not present

## 2018-09-24 DIAGNOSIS — N400 Enlarged prostate without lower urinary tract symptoms: Secondary | ICD-10-CM | POA: Diagnosis not present

## 2018-09-24 DIAGNOSIS — R1310 Dysphagia, unspecified: Secondary | ICD-10-CM | POA: Diagnosis not present

## 2018-09-24 DIAGNOSIS — I10 Essential (primary) hypertension: Secondary | ICD-10-CM | POA: Diagnosis not present

## 2018-09-24 DIAGNOSIS — E43 Unspecified severe protein-calorie malnutrition: Secondary | ICD-10-CM | POA: Diagnosis not present

## 2018-09-24 DIAGNOSIS — E119 Type 2 diabetes mellitus without complications: Secondary | ICD-10-CM | POA: Diagnosis not present

## 2018-09-24 DIAGNOSIS — K219 Gastro-esophageal reflux disease without esophagitis: Secondary | ICD-10-CM | POA: Diagnosis not present

## 2018-09-24 DIAGNOSIS — R32 Unspecified urinary incontinence: Secondary | ICD-10-CM | POA: Diagnosis not present

## 2018-09-24 DIAGNOSIS — G20 Parkinson's disease: Secondary | ICD-10-CM | POA: Diagnosis not present

## 2018-09-24 DIAGNOSIS — G2581 Restless legs syndrome: Secondary | ICD-10-CM | POA: Diagnosis not present

## 2018-09-24 DIAGNOSIS — E039 Hypothyroidism, unspecified: Secondary | ICD-10-CM | POA: Diagnosis not present

## 2018-09-24 DIAGNOSIS — I452 Bifascicular block: Secondary | ICD-10-CM | POA: Diagnosis not present

## 2018-09-24 DIAGNOSIS — Z741 Need for assistance with personal care: Secondary | ICD-10-CM | POA: Diagnosis not present

## 2018-09-26 DIAGNOSIS — E119 Type 2 diabetes mellitus without complications: Secondary | ICD-10-CM | POA: Diagnosis not present

## 2018-09-26 DIAGNOSIS — I10 Essential (primary) hypertension: Secondary | ICD-10-CM | POA: Diagnosis not present

## 2018-09-26 DIAGNOSIS — R1310 Dysphagia, unspecified: Secondary | ICD-10-CM | POA: Diagnosis not present

## 2018-09-26 DIAGNOSIS — E785 Hyperlipidemia, unspecified: Secondary | ICD-10-CM | POA: Diagnosis not present

## 2018-09-26 DIAGNOSIS — G20 Parkinson's disease: Secondary | ICD-10-CM | POA: Diagnosis not present

## 2018-09-26 DIAGNOSIS — E43 Unspecified severe protein-calorie malnutrition: Secondary | ICD-10-CM | POA: Diagnosis not present

## 2018-09-27 DIAGNOSIS — E119 Type 2 diabetes mellitus without complications: Secondary | ICD-10-CM | POA: Diagnosis not present

## 2018-09-27 DIAGNOSIS — R1310 Dysphagia, unspecified: Secondary | ICD-10-CM | POA: Diagnosis not present

## 2018-09-27 DIAGNOSIS — E785 Hyperlipidemia, unspecified: Secondary | ICD-10-CM | POA: Diagnosis not present

## 2018-09-27 DIAGNOSIS — E43 Unspecified severe protein-calorie malnutrition: Secondary | ICD-10-CM | POA: Diagnosis not present

## 2018-09-27 DIAGNOSIS — I10 Essential (primary) hypertension: Secondary | ICD-10-CM | POA: Diagnosis not present

## 2018-09-27 DIAGNOSIS — G20 Parkinson's disease: Secondary | ICD-10-CM | POA: Diagnosis not present

## 2018-09-28 ENCOUNTER — Other Ambulatory Visit: Payer: Self-pay | Admitting: Internal Medicine

## 2018-09-28 DIAGNOSIS — E119 Type 2 diabetes mellitus without complications: Secondary | ICD-10-CM | POA: Diagnosis not present

## 2018-09-28 DIAGNOSIS — E43 Unspecified severe protein-calorie malnutrition: Secondary | ICD-10-CM | POA: Diagnosis not present

## 2018-09-28 DIAGNOSIS — G20 Parkinson's disease: Secondary | ICD-10-CM | POA: Diagnosis not present

## 2018-09-28 DIAGNOSIS — R1310 Dysphagia, unspecified: Secondary | ICD-10-CM | POA: Diagnosis not present

## 2018-09-28 DIAGNOSIS — I10 Essential (primary) hypertension: Secondary | ICD-10-CM | POA: Diagnosis not present

## 2018-09-28 DIAGNOSIS — E785 Hyperlipidemia, unspecified: Secondary | ICD-10-CM | POA: Diagnosis not present

## 2018-09-29 DIAGNOSIS — E119 Type 2 diabetes mellitus without complications: Secondary | ICD-10-CM | POA: Diagnosis not present

## 2018-09-29 DIAGNOSIS — E785 Hyperlipidemia, unspecified: Secondary | ICD-10-CM | POA: Diagnosis not present

## 2018-09-29 DIAGNOSIS — G20 Parkinson's disease: Secondary | ICD-10-CM | POA: Diagnosis not present

## 2018-09-29 DIAGNOSIS — I10 Essential (primary) hypertension: Secondary | ICD-10-CM | POA: Diagnosis not present

## 2018-09-29 DIAGNOSIS — E43 Unspecified severe protein-calorie malnutrition: Secondary | ICD-10-CM | POA: Diagnosis not present

## 2018-09-29 DIAGNOSIS — R1310 Dysphagia, unspecified: Secondary | ICD-10-CM | POA: Diagnosis not present

## 2018-09-30 DIAGNOSIS — G20 Parkinson's disease: Secondary | ICD-10-CM | POA: Diagnosis not present

## 2018-09-30 DIAGNOSIS — E785 Hyperlipidemia, unspecified: Secondary | ICD-10-CM | POA: Diagnosis not present

## 2018-09-30 DIAGNOSIS — E119 Type 2 diabetes mellitus without complications: Secondary | ICD-10-CM | POA: Diagnosis not present

## 2018-09-30 DIAGNOSIS — E43 Unspecified severe protein-calorie malnutrition: Secondary | ICD-10-CM | POA: Diagnosis not present

## 2018-09-30 DIAGNOSIS — I10 Essential (primary) hypertension: Secondary | ICD-10-CM | POA: Diagnosis not present

## 2018-09-30 DIAGNOSIS — R1310 Dysphagia, unspecified: Secondary | ICD-10-CM | POA: Diagnosis not present

## 2018-10-03 DIAGNOSIS — R1310 Dysphagia, unspecified: Secondary | ICD-10-CM | POA: Diagnosis not present

## 2018-10-03 DIAGNOSIS — E43 Unspecified severe protein-calorie malnutrition: Secondary | ICD-10-CM | POA: Diagnosis not present

## 2018-10-03 DIAGNOSIS — E119 Type 2 diabetes mellitus without complications: Secondary | ICD-10-CM | POA: Diagnosis not present

## 2018-10-03 DIAGNOSIS — I10 Essential (primary) hypertension: Secondary | ICD-10-CM | POA: Diagnosis not present

## 2018-10-03 DIAGNOSIS — E785 Hyperlipidemia, unspecified: Secondary | ICD-10-CM | POA: Diagnosis not present

## 2018-10-03 DIAGNOSIS — G20 Parkinson's disease: Secondary | ICD-10-CM | POA: Diagnosis not present

## 2018-10-04 DIAGNOSIS — G20 Parkinson's disease: Secondary | ICD-10-CM | POA: Diagnosis not present

## 2018-10-04 DIAGNOSIS — E119 Type 2 diabetes mellitus without complications: Secondary | ICD-10-CM | POA: Diagnosis not present

## 2018-10-04 DIAGNOSIS — E785 Hyperlipidemia, unspecified: Secondary | ICD-10-CM | POA: Diagnosis not present

## 2018-10-04 DIAGNOSIS — I10 Essential (primary) hypertension: Secondary | ICD-10-CM | POA: Diagnosis not present

## 2018-10-04 DIAGNOSIS — E43 Unspecified severe protein-calorie malnutrition: Secondary | ICD-10-CM | POA: Diagnosis not present

## 2018-10-04 DIAGNOSIS — R1310 Dysphagia, unspecified: Secondary | ICD-10-CM | POA: Diagnosis not present

## 2018-10-05 DIAGNOSIS — E785 Hyperlipidemia, unspecified: Secondary | ICD-10-CM | POA: Diagnosis not present

## 2018-10-05 DIAGNOSIS — E119 Type 2 diabetes mellitus without complications: Secondary | ICD-10-CM | POA: Diagnosis not present

## 2018-10-05 DIAGNOSIS — I10 Essential (primary) hypertension: Secondary | ICD-10-CM | POA: Diagnosis not present

## 2018-10-05 DIAGNOSIS — E43 Unspecified severe protein-calorie malnutrition: Secondary | ICD-10-CM | POA: Diagnosis not present

## 2018-10-05 DIAGNOSIS — G20 Parkinson's disease: Secondary | ICD-10-CM | POA: Diagnosis not present

## 2018-10-05 DIAGNOSIS — R1310 Dysphagia, unspecified: Secondary | ICD-10-CM | POA: Diagnosis not present

## 2018-10-06 DIAGNOSIS — E119 Type 2 diabetes mellitus without complications: Secondary | ICD-10-CM | POA: Diagnosis not present

## 2018-10-06 DIAGNOSIS — E43 Unspecified severe protein-calorie malnutrition: Secondary | ICD-10-CM | POA: Diagnosis not present

## 2018-10-06 DIAGNOSIS — G20 Parkinson's disease: Secondary | ICD-10-CM | POA: Diagnosis not present

## 2018-10-06 DIAGNOSIS — R1310 Dysphagia, unspecified: Secondary | ICD-10-CM | POA: Diagnosis not present

## 2018-10-06 DIAGNOSIS — I10 Essential (primary) hypertension: Secondary | ICD-10-CM | POA: Diagnosis not present

## 2018-10-06 DIAGNOSIS — E785 Hyperlipidemia, unspecified: Secondary | ICD-10-CM | POA: Diagnosis not present

## 2018-10-07 DIAGNOSIS — R1310 Dysphagia, unspecified: Secondary | ICD-10-CM | POA: Diagnosis not present

## 2018-10-07 DIAGNOSIS — E785 Hyperlipidemia, unspecified: Secondary | ICD-10-CM | POA: Diagnosis not present

## 2018-10-07 DIAGNOSIS — E43 Unspecified severe protein-calorie malnutrition: Secondary | ICD-10-CM | POA: Diagnosis not present

## 2018-10-07 DIAGNOSIS — I10 Essential (primary) hypertension: Secondary | ICD-10-CM | POA: Diagnosis not present

## 2018-10-07 DIAGNOSIS — G20 Parkinson's disease: Secondary | ICD-10-CM | POA: Diagnosis not present

## 2018-10-07 DIAGNOSIS — E119 Type 2 diabetes mellitus without complications: Secondary | ICD-10-CM | POA: Diagnosis not present

## 2018-10-10 DIAGNOSIS — E43 Unspecified severe protein-calorie malnutrition: Secondary | ICD-10-CM | POA: Diagnosis not present

## 2018-10-10 DIAGNOSIS — G20 Parkinson's disease: Secondary | ICD-10-CM | POA: Diagnosis not present

## 2018-10-10 DIAGNOSIS — I10 Essential (primary) hypertension: Secondary | ICD-10-CM | POA: Diagnosis not present

## 2018-10-10 DIAGNOSIS — E119 Type 2 diabetes mellitus without complications: Secondary | ICD-10-CM | POA: Diagnosis not present

## 2018-10-10 DIAGNOSIS — E785 Hyperlipidemia, unspecified: Secondary | ICD-10-CM | POA: Diagnosis not present

## 2018-10-10 DIAGNOSIS — R1310 Dysphagia, unspecified: Secondary | ICD-10-CM | POA: Diagnosis not present

## 2018-10-11 DIAGNOSIS — E43 Unspecified severe protein-calorie malnutrition: Secondary | ICD-10-CM | POA: Diagnosis not present

## 2018-10-11 DIAGNOSIS — E119 Type 2 diabetes mellitus without complications: Secondary | ICD-10-CM | POA: Diagnosis not present

## 2018-10-11 DIAGNOSIS — I10 Essential (primary) hypertension: Secondary | ICD-10-CM | POA: Diagnosis not present

## 2018-10-11 DIAGNOSIS — E785 Hyperlipidemia, unspecified: Secondary | ICD-10-CM | POA: Diagnosis not present

## 2018-10-11 DIAGNOSIS — R1310 Dysphagia, unspecified: Secondary | ICD-10-CM | POA: Diagnosis not present

## 2018-10-11 DIAGNOSIS — G20 Parkinson's disease: Secondary | ICD-10-CM | POA: Diagnosis not present

## 2018-10-12 DIAGNOSIS — I10 Essential (primary) hypertension: Secondary | ICD-10-CM | POA: Diagnosis not present

## 2018-10-12 DIAGNOSIS — E43 Unspecified severe protein-calorie malnutrition: Secondary | ICD-10-CM | POA: Diagnosis not present

## 2018-10-12 DIAGNOSIS — G20 Parkinson's disease: Secondary | ICD-10-CM | POA: Diagnosis not present

## 2018-10-12 DIAGNOSIS — E785 Hyperlipidemia, unspecified: Secondary | ICD-10-CM | POA: Diagnosis not present

## 2018-10-12 DIAGNOSIS — E119 Type 2 diabetes mellitus without complications: Secondary | ICD-10-CM | POA: Diagnosis not present

## 2018-10-12 DIAGNOSIS — R1310 Dysphagia, unspecified: Secondary | ICD-10-CM | POA: Diagnosis not present

## 2018-10-13 DIAGNOSIS — E119 Type 2 diabetes mellitus without complications: Secondary | ICD-10-CM | POA: Diagnosis not present

## 2018-10-13 DIAGNOSIS — G20 Parkinson's disease: Secondary | ICD-10-CM | POA: Diagnosis not present

## 2018-10-13 DIAGNOSIS — E43 Unspecified severe protein-calorie malnutrition: Secondary | ICD-10-CM | POA: Diagnosis not present

## 2018-10-13 DIAGNOSIS — E785 Hyperlipidemia, unspecified: Secondary | ICD-10-CM | POA: Diagnosis not present

## 2018-10-13 DIAGNOSIS — I10 Essential (primary) hypertension: Secondary | ICD-10-CM | POA: Diagnosis not present

## 2018-10-13 DIAGNOSIS — R1310 Dysphagia, unspecified: Secondary | ICD-10-CM | POA: Diagnosis not present

## 2018-10-14 DIAGNOSIS — I10 Essential (primary) hypertension: Secondary | ICD-10-CM | POA: Diagnosis not present

## 2018-10-14 DIAGNOSIS — G20 Parkinson's disease: Secondary | ICD-10-CM | POA: Diagnosis not present

## 2018-10-14 DIAGNOSIS — E43 Unspecified severe protein-calorie malnutrition: Secondary | ICD-10-CM | POA: Diagnosis not present

## 2018-10-14 DIAGNOSIS — R1310 Dysphagia, unspecified: Secondary | ICD-10-CM | POA: Diagnosis not present

## 2018-10-14 DIAGNOSIS — E119 Type 2 diabetes mellitus without complications: Secondary | ICD-10-CM | POA: Diagnosis not present

## 2018-10-14 DIAGNOSIS — E785 Hyperlipidemia, unspecified: Secondary | ICD-10-CM | POA: Diagnosis not present

## 2018-10-17 DIAGNOSIS — E43 Unspecified severe protein-calorie malnutrition: Secondary | ICD-10-CM | POA: Diagnosis not present

## 2018-10-17 DIAGNOSIS — G20 Parkinson's disease: Secondary | ICD-10-CM | POA: Diagnosis not present

## 2018-10-17 DIAGNOSIS — R1310 Dysphagia, unspecified: Secondary | ICD-10-CM | POA: Diagnosis not present

## 2018-10-17 DIAGNOSIS — E785 Hyperlipidemia, unspecified: Secondary | ICD-10-CM | POA: Diagnosis not present

## 2018-10-17 DIAGNOSIS — E119 Type 2 diabetes mellitus without complications: Secondary | ICD-10-CM | POA: Diagnosis not present

## 2018-10-17 DIAGNOSIS — I10 Essential (primary) hypertension: Secondary | ICD-10-CM | POA: Diagnosis not present

## 2018-10-18 DIAGNOSIS — G20 Parkinson's disease: Secondary | ICD-10-CM | POA: Diagnosis not present

## 2018-10-18 DIAGNOSIS — I10 Essential (primary) hypertension: Secondary | ICD-10-CM | POA: Diagnosis not present

## 2018-10-18 DIAGNOSIS — R1310 Dysphagia, unspecified: Secondary | ICD-10-CM | POA: Diagnosis not present

## 2018-10-18 DIAGNOSIS — E785 Hyperlipidemia, unspecified: Secondary | ICD-10-CM | POA: Diagnosis not present

## 2018-10-18 DIAGNOSIS — E119 Type 2 diabetes mellitus without complications: Secondary | ICD-10-CM | POA: Diagnosis not present

## 2018-10-18 DIAGNOSIS — E43 Unspecified severe protein-calorie malnutrition: Secondary | ICD-10-CM | POA: Diagnosis not present

## 2018-10-19 DIAGNOSIS — E43 Unspecified severe protein-calorie malnutrition: Secondary | ICD-10-CM | POA: Diagnosis not present

## 2018-10-19 DIAGNOSIS — G20 Parkinson's disease: Secondary | ICD-10-CM | POA: Diagnosis not present

## 2018-10-19 DIAGNOSIS — E785 Hyperlipidemia, unspecified: Secondary | ICD-10-CM | POA: Diagnosis not present

## 2018-10-19 DIAGNOSIS — I10 Essential (primary) hypertension: Secondary | ICD-10-CM | POA: Diagnosis not present

## 2018-10-19 DIAGNOSIS — E119 Type 2 diabetes mellitus without complications: Secondary | ICD-10-CM | POA: Diagnosis not present

## 2018-10-19 DIAGNOSIS — R1310 Dysphagia, unspecified: Secondary | ICD-10-CM | POA: Diagnosis not present

## 2018-10-20 ENCOUNTER — Other Ambulatory Visit: Payer: Self-pay | Admitting: *Deleted

## 2018-10-20 DIAGNOSIS — R1310 Dysphagia, unspecified: Secondary | ICD-10-CM | POA: Diagnosis not present

## 2018-10-20 DIAGNOSIS — I10 Essential (primary) hypertension: Secondary | ICD-10-CM | POA: Diagnosis not present

## 2018-10-20 DIAGNOSIS — E785 Hyperlipidemia, unspecified: Secondary | ICD-10-CM | POA: Diagnosis not present

## 2018-10-20 DIAGNOSIS — G20 Parkinson's disease: Secondary | ICD-10-CM | POA: Diagnosis not present

## 2018-10-20 DIAGNOSIS — E119 Type 2 diabetes mellitus without complications: Secondary | ICD-10-CM | POA: Diagnosis not present

## 2018-10-20 DIAGNOSIS — E43 Unspecified severe protein-calorie malnutrition: Secondary | ICD-10-CM | POA: Diagnosis not present

## 2018-10-20 MED ORDER — CYANOCOBALAMIN 1000 MCG/ML IJ SOLN: INTRAMUSCULAR | 0 refills | Status: DC

## 2018-10-20 NOTE — Telephone Encounter (Signed)
Wife Requested Rx

## 2018-10-21 DIAGNOSIS — G20 Parkinson's disease: Secondary | ICD-10-CM | POA: Diagnosis not present

## 2018-10-21 DIAGNOSIS — E119 Type 2 diabetes mellitus without complications: Secondary | ICD-10-CM | POA: Diagnosis not present

## 2018-10-21 DIAGNOSIS — I10 Essential (primary) hypertension: Secondary | ICD-10-CM | POA: Diagnosis not present

## 2018-10-21 DIAGNOSIS — E785 Hyperlipidemia, unspecified: Secondary | ICD-10-CM | POA: Diagnosis not present

## 2018-10-21 DIAGNOSIS — R1310 Dysphagia, unspecified: Secondary | ICD-10-CM | POA: Diagnosis not present

## 2018-10-21 DIAGNOSIS — E43 Unspecified severe protein-calorie malnutrition: Secondary | ICD-10-CM | POA: Diagnosis not present

## 2018-10-24 ENCOUNTER — Other Ambulatory Visit: Payer: Self-pay | Admitting: *Deleted

## 2018-10-24 DIAGNOSIS — G20 Parkinson's disease: Secondary | ICD-10-CM | POA: Diagnosis not present

## 2018-10-24 DIAGNOSIS — E43 Unspecified severe protein-calorie malnutrition: Secondary | ICD-10-CM | POA: Diagnosis not present

## 2018-10-24 DIAGNOSIS — I10 Essential (primary) hypertension: Secondary | ICD-10-CM | POA: Diagnosis not present

## 2018-10-24 DIAGNOSIS — R1310 Dysphagia, unspecified: Secondary | ICD-10-CM | POA: Diagnosis not present

## 2018-10-24 DIAGNOSIS — E785 Hyperlipidemia, unspecified: Secondary | ICD-10-CM | POA: Diagnosis not present

## 2018-10-24 DIAGNOSIS — E119 Type 2 diabetes mellitus without complications: Secondary | ICD-10-CM | POA: Diagnosis not present

## 2018-10-24 MED ORDER — CYANOCOBALAMIN 1000 MCG/ML IJ SOLN: INTRAMUSCULAR | 0 refills | Status: AC

## 2018-10-25 DIAGNOSIS — I10 Essential (primary) hypertension: Secondary | ICD-10-CM | POA: Diagnosis not present

## 2018-10-25 DIAGNOSIS — G20 Parkinson's disease: Secondary | ICD-10-CM | POA: Diagnosis not present

## 2018-10-25 DIAGNOSIS — G2581 Restless legs syndrome: Secondary | ICD-10-CM | POA: Diagnosis not present

## 2018-10-25 DIAGNOSIS — Z7401 Bed confinement status: Secondary | ICD-10-CM | POA: Diagnosis not present

## 2018-10-25 DIAGNOSIS — R1310 Dysphagia, unspecified: Secondary | ICD-10-CM | POA: Diagnosis not present

## 2018-10-25 DIAGNOSIS — K219 Gastro-esophageal reflux disease without esophagitis: Secondary | ICD-10-CM | POA: Diagnosis not present

## 2018-10-25 DIAGNOSIS — E039 Hypothyroidism, unspecified: Secondary | ICD-10-CM | POA: Diagnosis not present

## 2018-10-25 DIAGNOSIS — E43 Unspecified severe protein-calorie malnutrition: Secondary | ICD-10-CM | POA: Diagnosis not present

## 2018-10-25 DIAGNOSIS — E119 Type 2 diabetes mellitus without complications: Secondary | ICD-10-CM | POA: Diagnosis not present

## 2018-10-25 DIAGNOSIS — I452 Bifascicular block: Secondary | ICD-10-CM | POA: Diagnosis not present

## 2018-10-25 DIAGNOSIS — R32 Unspecified urinary incontinence: Secondary | ICD-10-CM | POA: Diagnosis not present

## 2018-10-25 DIAGNOSIS — H04123 Dry eye syndrome of bilateral lacrimal glands: Secondary | ICD-10-CM | POA: Diagnosis not present

## 2018-10-25 DIAGNOSIS — E785 Hyperlipidemia, unspecified: Secondary | ICD-10-CM | POA: Diagnosis not present

## 2018-10-25 DIAGNOSIS — Z741 Need for assistance with personal care: Secondary | ICD-10-CM | POA: Diagnosis not present

## 2018-10-25 DIAGNOSIS — L89152 Pressure ulcer of sacral region, stage 2: Secondary | ICD-10-CM | POA: Diagnosis not present

## 2018-10-25 DIAGNOSIS — N400 Enlarged prostate without lower urinary tract symptoms: Secondary | ICD-10-CM | POA: Diagnosis not present

## 2018-10-26 DIAGNOSIS — G20 Parkinson's disease: Secondary | ICD-10-CM | POA: Diagnosis not present

## 2018-10-26 DIAGNOSIS — R1310 Dysphagia, unspecified: Secondary | ICD-10-CM | POA: Diagnosis not present

## 2018-10-26 DIAGNOSIS — E119 Type 2 diabetes mellitus without complications: Secondary | ICD-10-CM | POA: Diagnosis not present

## 2018-10-26 DIAGNOSIS — E43 Unspecified severe protein-calorie malnutrition: Secondary | ICD-10-CM | POA: Diagnosis not present

## 2018-10-26 DIAGNOSIS — E785 Hyperlipidemia, unspecified: Secondary | ICD-10-CM | POA: Diagnosis not present

## 2018-10-26 DIAGNOSIS — I10 Essential (primary) hypertension: Secondary | ICD-10-CM | POA: Diagnosis not present

## 2018-10-27 DIAGNOSIS — E785 Hyperlipidemia, unspecified: Secondary | ICD-10-CM | POA: Diagnosis not present

## 2018-10-27 DIAGNOSIS — I10 Essential (primary) hypertension: Secondary | ICD-10-CM | POA: Diagnosis not present

## 2018-10-27 DIAGNOSIS — R1310 Dysphagia, unspecified: Secondary | ICD-10-CM | POA: Diagnosis not present

## 2018-10-27 DIAGNOSIS — E43 Unspecified severe protein-calorie malnutrition: Secondary | ICD-10-CM | POA: Diagnosis not present

## 2018-10-27 DIAGNOSIS — E119 Type 2 diabetes mellitus without complications: Secondary | ICD-10-CM | POA: Diagnosis not present

## 2018-10-27 DIAGNOSIS — G20 Parkinson's disease: Secondary | ICD-10-CM | POA: Diagnosis not present

## 2018-10-28 DIAGNOSIS — E785 Hyperlipidemia, unspecified: Secondary | ICD-10-CM | POA: Diagnosis not present

## 2018-10-28 DIAGNOSIS — E43 Unspecified severe protein-calorie malnutrition: Secondary | ICD-10-CM | POA: Diagnosis not present

## 2018-10-28 DIAGNOSIS — I10 Essential (primary) hypertension: Secondary | ICD-10-CM | POA: Diagnosis not present

## 2018-10-28 DIAGNOSIS — G20 Parkinson's disease: Secondary | ICD-10-CM | POA: Diagnosis not present

## 2018-10-28 DIAGNOSIS — R1310 Dysphagia, unspecified: Secondary | ICD-10-CM | POA: Diagnosis not present

## 2018-10-28 DIAGNOSIS — E119 Type 2 diabetes mellitus without complications: Secondary | ICD-10-CM | POA: Diagnosis not present

## 2018-10-31 DIAGNOSIS — R1310 Dysphagia, unspecified: Secondary | ICD-10-CM | POA: Diagnosis not present

## 2018-10-31 DIAGNOSIS — E43 Unspecified severe protein-calorie malnutrition: Secondary | ICD-10-CM | POA: Diagnosis not present

## 2018-10-31 DIAGNOSIS — I10 Essential (primary) hypertension: Secondary | ICD-10-CM | POA: Diagnosis not present

## 2018-10-31 DIAGNOSIS — E785 Hyperlipidemia, unspecified: Secondary | ICD-10-CM | POA: Diagnosis not present

## 2018-10-31 DIAGNOSIS — E119 Type 2 diabetes mellitus without complications: Secondary | ICD-10-CM | POA: Diagnosis not present

## 2018-10-31 DIAGNOSIS — G20 Parkinson's disease: Secondary | ICD-10-CM | POA: Diagnosis not present

## 2018-11-01 DIAGNOSIS — G20 Parkinson's disease: Secondary | ICD-10-CM | POA: Diagnosis not present

## 2018-11-01 DIAGNOSIS — R1310 Dysphagia, unspecified: Secondary | ICD-10-CM | POA: Diagnosis not present

## 2018-11-01 DIAGNOSIS — E119 Type 2 diabetes mellitus without complications: Secondary | ICD-10-CM | POA: Diagnosis not present

## 2018-11-01 DIAGNOSIS — E43 Unspecified severe protein-calorie malnutrition: Secondary | ICD-10-CM | POA: Diagnosis not present

## 2018-11-01 DIAGNOSIS — E785 Hyperlipidemia, unspecified: Secondary | ICD-10-CM | POA: Diagnosis not present

## 2018-11-01 DIAGNOSIS — I10 Essential (primary) hypertension: Secondary | ICD-10-CM | POA: Diagnosis not present

## 2018-11-02 DIAGNOSIS — I10 Essential (primary) hypertension: Secondary | ICD-10-CM | POA: Diagnosis not present

## 2018-11-02 DIAGNOSIS — E43 Unspecified severe protein-calorie malnutrition: Secondary | ICD-10-CM | POA: Diagnosis not present

## 2018-11-02 DIAGNOSIS — G20 Parkinson's disease: Secondary | ICD-10-CM | POA: Diagnosis not present

## 2018-11-02 DIAGNOSIS — E785 Hyperlipidemia, unspecified: Secondary | ICD-10-CM | POA: Diagnosis not present

## 2018-11-02 DIAGNOSIS — R1310 Dysphagia, unspecified: Secondary | ICD-10-CM | POA: Diagnosis not present

## 2018-11-02 DIAGNOSIS — E119 Type 2 diabetes mellitus without complications: Secondary | ICD-10-CM | POA: Diagnosis not present

## 2018-11-03 DIAGNOSIS — E119 Type 2 diabetes mellitus without complications: Secondary | ICD-10-CM | POA: Diagnosis not present

## 2018-11-03 DIAGNOSIS — I10 Essential (primary) hypertension: Secondary | ICD-10-CM | POA: Diagnosis not present

## 2018-11-03 DIAGNOSIS — R1310 Dysphagia, unspecified: Secondary | ICD-10-CM | POA: Diagnosis not present

## 2018-11-03 DIAGNOSIS — E785 Hyperlipidemia, unspecified: Secondary | ICD-10-CM | POA: Diagnosis not present

## 2018-11-03 DIAGNOSIS — E43 Unspecified severe protein-calorie malnutrition: Secondary | ICD-10-CM | POA: Diagnosis not present

## 2018-11-03 DIAGNOSIS — G20 Parkinson's disease: Secondary | ICD-10-CM | POA: Diagnosis not present

## 2018-11-04 DIAGNOSIS — E43 Unspecified severe protein-calorie malnutrition: Secondary | ICD-10-CM | POA: Diagnosis not present

## 2018-11-04 DIAGNOSIS — E119 Type 2 diabetes mellitus without complications: Secondary | ICD-10-CM | POA: Diagnosis not present

## 2018-11-04 DIAGNOSIS — E785 Hyperlipidemia, unspecified: Secondary | ICD-10-CM | POA: Diagnosis not present

## 2018-11-04 DIAGNOSIS — R1310 Dysphagia, unspecified: Secondary | ICD-10-CM | POA: Diagnosis not present

## 2018-11-04 DIAGNOSIS — G20 Parkinson's disease: Secondary | ICD-10-CM | POA: Diagnosis not present

## 2018-11-04 DIAGNOSIS — I10 Essential (primary) hypertension: Secondary | ICD-10-CM | POA: Diagnosis not present

## 2018-11-05 DIAGNOSIS — E785 Hyperlipidemia, unspecified: Secondary | ICD-10-CM | POA: Diagnosis not present

## 2018-11-05 DIAGNOSIS — E43 Unspecified severe protein-calorie malnutrition: Secondary | ICD-10-CM | POA: Diagnosis not present

## 2018-11-05 DIAGNOSIS — R1310 Dysphagia, unspecified: Secondary | ICD-10-CM | POA: Diagnosis not present

## 2018-11-05 DIAGNOSIS — I10 Essential (primary) hypertension: Secondary | ICD-10-CM | POA: Diagnosis not present

## 2018-11-05 DIAGNOSIS — E119 Type 2 diabetes mellitus without complications: Secondary | ICD-10-CM | POA: Diagnosis not present

## 2018-11-05 DIAGNOSIS — G20 Parkinson's disease: Secondary | ICD-10-CM | POA: Diagnosis not present

## 2018-11-07 DIAGNOSIS — G20 Parkinson's disease: Secondary | ICD-10-CM | POA: Diagnosis not present

## 2018-11-07 DIAGNOSIS — E119 Type 2 diabetes mellitus without complications: Secondary | ICD-10-CM | POA: Diagnosis not present

## 2018-11-07 DIAGNOSIS — E43 Unspecified severe protein-calorie malnutrition: Secondary | ICD-10-CM | POA: Diagnosis not present

## 2018-11-07 DIAGNOSIS — E785 Hyperlipidemia, unspecified: Secondary | ICD-10-CM | POA: Diagnosis not present

## 2018-11-07 DIAGNOSIS — R1310 Dysphagia, unspecified: Secondary | ICD-10-CM | POA: Diagnosis not present

## 2018-11-07 DIAGNOSIS — I10 Essential (primary) hypertension: Secondary | ICD-10-CM | POA: Diagnosis not present

## 2018-11-08 DIAGNOSIS — G20 Parkinson's disease: Secondary | ICD-10-CM | POA: Diagnosis not present

## 2018-11-08 DIAGNOSIS — R1310 Dysphagia, unspecified: Secondary | ICD-10-CM | POA: Diagnosis not present

## 2018-11-08 DIAGNOSIS — E785 Hyperlipidemia, unspecified: Secondary | ICD-10-CM | POA: Diagnosis not present

## 2018-11-08 DIAGNOSIS — I10 Essential (primary) hypertension: Secondary | ICD-10-CM | POA: Diagnosis not present

## 2018-11-08 DIAGNOSIS — E43 Unspecified severe protein-calorie malnutrition: Secondary | ICD-10-CM | POA: Diagnosis not present

## 2018-11-08 DIAGNOSIS — E119 Type 2 diabetes mellitus without complications: Secondary | ICD-10-CM | POA: Diagnosis not present

## 2018-11-09 DIAGNOSIS — I10 Essential (primary) hypertension: Secondary | ICD-10-CM | POA: Diagnosis not present

## 2018-11-09 DIAGNOSIS — E43 Unspecified severe protein-calorie malnutrition: Secondary | ICD-10-CM | POA: Diagnosis not present

## 2018-11-09 DIAGNOSIS — E785 Hyperlipidemia, unspecified: Secondary | ICD-10-CM | POA: Diagnosis not present

## 2018-11-09 DIAGNOSIS — G20 Parkinson's disease: Secondary | ICD-10-CM | POA: Diagnosis not present

## 2018-11-09 DIAGNOSIS — E119 Type 2 diabetes mellitus without complications: Secondary | ICD-10-CM | POA: Diagnosis not present

## 2018-11-09 DIAGNOSIS — R1310 Dysphagia, unspecified: Secondary | ICD-10-CM | POA: Diagnosis not present

## 2018-11-10 DIAGNOSIS — E119 Type 2 diabetes mellitus without complications: Secondary | ICD-10-CM | POA: Diagnosis not present

## 2018-11-10 DIAGNOSIS — E785 Hyperlipidemia, unspecified: Secondary | ICD-10-CM | POA: Diagnosis not present

## 2018-11-10 DIAGNOSIS — R1310 Dysphagia, unspecified: Secondary | ICD-10-CM | POA: Diagnosis not present

## 2018-11-10 DIAGNOSIS — I10 Essential (primary) hypertension: Secondary | ICD-10-CM | POA: Diagnosis not present

## 2018-11-10 DIAGNOSIS — E43 Unspecified severe protein-calorie malnutrition: Secondary | ICD-10-CM | POA: Diagnosis not present

## 2018-11-10 DIAGNOSIS — G20 Parkinson's disease: Secondary | ICD-10-CM | POA: Diagnosis not present

## 2018-11-11 DIAGNOSIS — E785 Hyperlipidemia, unspecified: Secondary | ICD-10-CM | POA: Diagnosis not present

## 2018-11-11 DIAGNOSIS — E119 Type 2 diabetes mellitus without complications: Secondary | ICD-10-CM | POA: Diagnosis not present

## 2018-11-11 DIAGNOSIS — G20 Parkinson's disease: Secondary | ICD-10-CM | POA: Diagnosis not present

## 2018-11-11 DIAGNOSIS — E43 Unspecified severe protein-calorie malnutrition: Secondary | ICD-10-CM | POA: Diagnosis not present

## 2018-11-11 DIAGNOSIS — I10 Essential (primary) hypertension: Secondary | ICD-10-CM | POA: Diagnosis not present

## 2018-11-11 DIAGNOSIS — R1310 Dysphagia, unspecified: Secondary | ICD-10-CM | POA: Diagnosis not present

## 2018-11-14 DIAGNOSIS — E785 Hyperlipidemia, unspecified: Secondary | ICD-10-CM | POA: Diagnosis not present

## 2018-11-14 DIAGNOSIS — I10 Essential (primary) hypertension: Secondary | ICD-10-CM | POA: Diagnosis not present

## 2018-11-14 DIAGNOSIS — G20 Parkinson's disease: Secondary | ICD-10-CM | POA: Diagnosis not present

## 2018-11-14 DIAGNOSIS — E119 Type 2 diabetes mellitus without complications: Secondary | ICD-10-CM | POA: Diagnosis not present

## 2018-11-14 DIAGNOSIS — R1310 Dysphagia, unspecified: Secondary | ICD-10-CM | POA: Diagnosis not present

## 2018-11-14 DIAGNOSIS — E43 Unspecified severe protein-calorie malnutrition: Secondary | ICD-10-CM | POA: Diagnosis not present

## 2018-11-15 DIAGNOSIS — E785 Hyperlipidemia, unspecified: Secondary | ICD-10-CM | POA: Diagnosis not present

## 2018-11-15 DIAGNOSIS — E43 Unspecified severe protein-calorie malnutrition: Secondary | ICD-10-CM | POA: Diagnosis not present

## 2018-11-15 DIAGNOSIS — R1310 Dysphagia, unspecified: Secondary | ICD-10-CM | POA: Diagnosis not present

## 2018-11-15 DIAGNOSIS — G20 Parkinson's disease: Secondary | ICD-10-CM | POA: Diagnosis not present

## 2018-11-15 DIAGNOSIS — E119 Type 2 diabetes mellitus without complications: Secondary | ICD-10-CM | POA: Diagnosis not present

## 2018-11-15 DIAGNOSIS — I10 Essential (primary) hypertension: Secondary | ICD-10-CM | POA: Diagnosis not present

## 2018-11-16 DIAGNOSIS — I10 Essential (primary) hypertension: Secondary | ICD-10-CM | POA: Diagnosis not present

## 2018-11-16 DIAGNOSIS — E43 Unspecified severe protein-calorie malnutrition: Secondary | ICD-10-CM | POA: Diagnosis not present

## 2018-11-16 DIAGNOSIS — E119 Type 2 diabetes mellitus without complications: Secondary | ICD-10-CM | POA: Diagnosis not present

## 2018-11-16 DIAGNOSIS — R1310 Dysphagia, unspecified: Secondary | ICD-10-CM | POA: Diagnosis not present

## 2018-11-16 DIAGNOSIS — E785 Hyperlipidemia, unspecified: Secondary | ICD-10-CM | POA: Diagnosis not present

## 2018-11-16 DIAGNOSIS — G20 Parkinson's disease: Secondary | ICD-10-CM | POA: Diagnosis not present

## 2018-11-17 DIAGNOSIS — R1310 Dysphagia, unspecified: Secondary | ICD-10-CM | POA: Diagnosis not present

## 2018-11-17 DIAGNOSIS — E119 Type 2 diabetes mellitus without complications: Secondary | ICD-10-CM | POA: Diagnosis not present

## 2018-11-17 DIAGNOSIS — I10 Essential (primary) hypertension: Secondary | ICD-10-CM | POA: Diagnosis not present

## 2018-11-17 DIAGNOSIS — G20 Parkinson's disease: Secondary | ICD-10-CM | POA: Diagnosis not present

## 2018-11-17 DIAGNOSIS — E785 Hyperlipidemia, unspecified: Secondary | ICD-10-CM | POA: Diagnosis not present

## 2018-11-17 DIAGNOSIS — E43 Unspecified severe protein-calorie malnutrition: Secondary | ICD-10-CM | POA: Diagnosis not present

## 2018-11-18 DIAGNOSIS — E119 Type 2 diabetes mellitus without complications: Secondary | ICD-10-CM | POA: Diagnosis not present

## 2018-11-18 DIAGNOSIS — R1310 Dysphagia, unspecified: Secondary | ICD-10-CM | POA: Diagnosis not present

## 2018-11-18 DIAGNOSIS — E785 Hyperlipidemia, unspecified: Secondary | ICD-10-CM | POA: Diagnosis not present

## 2018-11-18 DIAGNOSIS — I10 Essential (primary) hypertension: Secondary | ICD-10-CM | POA: Diagnosis not present

## 2018-11-18 DIAGNOSIS — G20 Parkinson's disease: Secondary | ICD-10-CM | POA: Diagnosis not present

## 2018-11-18 DIAGNOSIS — E43 Unspecified severe protein-calorie malnutrition: Secondary | ICD-10-CM | POA: Diagnosis not present

## 2018-11-21 DIAGNOSIS — R1310 Dysphagia, unspecified: Secondary | ICD-10-CM | POA: Diagnosis not present

## 2018-11-21 DIAGNOSIS — E785 Hyperlipidemia, unspecified: Secondary | ICD-10-CM | POA: Diagnosis not present

## 2018-11-21 DIAGNOSIS — E119 Type 2 diabetes mellitus without complications: Secondary | ICD-10-CM | POA: Diagnosis not present

## 2018-11-21 DIAGNOSIS — E43 Unspecified severe protein-calorie malnutrition: Secondary | ICD-10-CM | POA: Diagnosis not present

## 2018-11-21 DIAGNOSIS — G20 Parkinson's disease: Secondary | ICD-10-CM | POA: Diagnosis not present

## 2018-11-21 DIAGNOSIS — I10 Essential (primary) hypertension: Secondary | ICD-10-CM | POA: Diagnosis not present

## 2018-11-22 DIAGNOSIS — E119 Type 2 diabetes mellitus without complications: Secondary | ICD-10-CM | POA: Diagnosis not present

## 2018-11-22 DIAGNOSIS — R1310 Dysphagia, unspecified: Secondary | ICD-10-CM | POA: Diagnosis not present

## 2018-11-22 DIAGNOSIS — I10 Essential (primary) hypertension: Secondary | ICD-10-CM | POA: Diagnosis not present

## 2018-11-22 DIAGNOSIS — E785 Hyperlipidemia, unspecified: Secondary | ICD-10-CM | POA: Diagnosis not present

## 2018-11-22 DIAGNOSIS — G20 Parkinson's disease: Secondary | ICD-10-CM | POA: Diagnosis not present

## 2018-11-22 DIAGNOSIS — E43 Unspecified severe protein-calorie malnutrition: Secondary | ICD-10-CM | POA: Diagnosis not present

## 2018-11-23 DIAGNOSIS — G20 Parkinson's disease: Secondary | ICD-10-CM | POA: Diagnosis not present

## 2018-11-23 DIAGNOSIS — I10 Essential (primary) hypertension: Secondary | ICD-10-CM | POA: Diagnosis not present

## 2018-11-23 DIAGNOSIS — E785 Hyperlipidemia, unspecified: Secondary | ICD-10-CM | POA: Diagnosis not present

## 2018-11-23 DIAGNOSIS — E43 Unspecified severe protein-calorie malnutrition: Secondary | ICD-10-CM | POA: Diagnosis not present

## 2018-11-23 DIAGNOSIS — R1310 Dysphagia, unspecified: Secondary | ICD-10-CM | POA: Diagnosis not present

## 2018-11-23 DIAGNOSIS — E119 Type 2 diabetes mellitus without complications: Secondary | ICD-10-CM | POA: Diagnosis not present

## 2018-11-24 DIAGNOSIS — E119 Type 2 diabetes mellitus without complications: Secondary | ICD-10-CM | POA: Diagnosis not present

## 2018-11-24 DIAGNOSIS — I452 Bifascicular block: Secondary | ICD-10-CM | POA: Diagnosis not present

## 2018-11-24 DIAGNOSIS — I10 Essential (primary) hypertension: Secondary | ICD-10-CM | POA: Diagnosis not present

## 2018-11-24 DIAGNOSIS — R32 Unspecified urinary incontinence: Secondary | ICD-10-CM | POA: Diagnosis not present

## 2018-11-24 DIAGNOSIS — K219 Gastro-esophageal reflux disease without esophagitis: Secondary | ICD-10-CM | POA: Diagnosis not present

## 2018-11-24 DIAGNOSIS — E43 Unspecified severe protein-calorie malnutrition: Secondary | ICD-10-CM | POA: Diagnosis not present

## 2018-11-24 DIAGNOSIS — L89152 Pressure ulcer of sacral region, stage 2: Secondary | ICD-10-CM | POA: Diagnosis not present

## 2018-11-24 DIAGNOSIS — R1310 Dysphagia, unspecified: Secondary | ICD-10-CM | POA: Diagnosis not present

## 2018-11-24 DIAGNOSIS — G20 Parkinson's disease: Secondary | ICD-10-CM | POA: Diagnosis not present

## 2018-11-24 DIAGNOSIS — Z7401 Bed confinement status: Secondary | ICD-10-CM | POA: Diagnosis not present

## 2018-11-24 DIAGNOSIS — N400 Enlarged prostate without lower urinary tract symptoms: Secondary | ICD-10-CM | POA: Diagnosis not present

## 2018-11-24 DIAGNOSIS — E039 Hypothyroidism, unspecified: Secondary | ICD-10-CM | POA: Diagnosis not present

## 2018-11-24 DIAGNOSIS — Z741 Need for assistance with personal care: Secondary | ICD-10-CM | POA: Diagnosis not present

## 2018-11-24 DIAGNOSIS — E785 Hyperlipidemia, unspecified: Secondary | ICD-10-CM | POA: Diagnosis not present

## 2018-11-24 DIAGNOSIS — H04123 Dry eye syndrome of bilateral lacrimal glands: Secondary | ICD-10-CM | POA: Diagnosis not present

## 2018-11-24 DIAGNOSIS — G2581 Restless legs syndrome: Secondary | ICD-10-CM | POA: Diagnosis not present

## 2018-11-25 DIAGNOSIS — E119 Type 2 diabetes mellitus without complications: Secondary | ICD-10-CM | POA: Diagnosis not present

## 2018-11-25 DIAGNOSIS — G20 Parkinson's disease: Secondary | ICD-10-CM | POA: Diagnosis not present

## 2018-11-25 DIAGNOSIS — I10 Essential (primary) hypertension: Secondary | ICD-10-CM | POA: Diagnosis not present

## 2018-11-25 DIAGNOSIS — E785 Hyperlipidemia, unspecified: Secondary | ICD-10-CM | POA: Diagnosis not present

## 2018-11-25 DIAGNOSIS — R1310 Dysphagia, unspecified: Secondary | ICD-10-CM | POA: Diagnosis not present

## 2018-11-25 DIAGNOSIS — E43 Unspecified severe protein-calorie malnutrition: Secondary | ICD-10-CM | POA: Diagnosis not present

## 2018-11-28 DIAGNOSIS — R1310 Dysphagia, unspecified: Secondary | ICD-10-CM | POA: Diagnosis not present

## 2018-11-28 DIAGNOSIS — G20 Parkinson's disease: Secondary | ICD-10-CM | POA: Diagnosis not present

## 2018-11-28 DIAGNOSIS — I10 Essential (primary) hypertension: Secondary | ICD-10-CM | POA: Diagnosis not present

## 2018-11-28 DIAGNOSIS — E119 Type 2 diabetes mellitus without complications: Secondary | ICD-10-CM | POA: Diagnosis not present

## 2018-11-28 DIAGNOSIS — E785 Hyperlipidemia, unspecified: Secondary | ICD-10-CM | POA: Diagnosis not present

## 2018-11-28 DIAGNOSIS — E43 Unspecified severe protein-calorie malnutrition: Secondary | ICD-10-CM | POA: Diagnosis not present

## 2018-11-29 DIAGNOSIS — I10 Essential (primary) hypertension: Secondary | ICD-10-CM | POA: Diagnosis not present

## 2018-11-29 DIAGNOSIS — E43 Unspecified severe protein-calorie malnutrition: Secondary | ICD-10-CM | POA: Diagnosis not present

## 2018-11-29 DIAGNOSIS — R1310 Dysphagia, unspecified: Secondary | ICD-10-CM | POA: Diagnosis not present

## 2018-11-29 DIAGNOSIS — E119 Type 2 diabetes mellitus without complications: Secondary | ICD-10-CM | POA: Diagnosis not present

## 2018-11-29 DIAGNOSIS — G20 Parkinson's disease: Secondary | ICD-10-CM | POA: Diagnosis not present

## 2018-11-29 DIAGNOSIS — E785 Hyperlipidemia, unspecified: Secondary | ICD-10-CM | POA: Diagnosis not present

## 2018-11-30 DIAGNOSIS — E785 Hyperlipidemia, unspecified: Secondary | ICD-10-CM | POA: Diagnosis not present

## 2018-11-30 DIAGNOSIS — I10 Essential (primary) hypertension: Secondary | ICD-10-CM | POA: Diagnosis not present

## 2018-11-30 DIAGNOSIS — E43 Unspecified severe protein-calorie malnutrition: Secondary | ICD-10-CM | POA: Diagnosis not present

## 2018-11-30 DIAGNOSIS — G20 Parkinson's disease: Secondary | ICD-10-CM | POA: Diagnosis not present

## 2018-11-30 DIAGNOSIS — E119 Type 2 diabetes mellitus without complications: Secondary | ICD-10-CM | POA: Diagnosis not present

## 2018-11-30 DIAGNOSIS — R1310 Dysphagia, unspecified: Secondary | ICD-10-CM | POA: Diagnosis not present

## 2018-12-01 DIAGNOSIS — E119 Type 2 diabetes mellitus without complications: Secondary | ICD-10-CM | POA: Diagnosis not present

## 2018-12-01 DIAGNOSIS — E785 Hyperlipidemia, unspecified: Secondary | ICD-10-CM | POA: Diagnosis not present

## 2018-12-01 DIAGNOSIS — E43 Unspecified severe protein-calorie malnutrition: Secondary | ICD-10-CM | POA: Diagnosis not present

## 2018-12-01 DIAGNOSIS — I10 Essential (primary) hypertension: Secondary | ICD-10-CM | POA: Diagnosis not present

## 2018-12-01 DIAGNOSIS — R1310 Dysphagia, unspecified: Secondary | ICD-10-CM | POA: Diagnosis not present

## 2018-12-01 DIAGNOSIS — G20 Parkinson's disease: Secondary | ICD-10-CM | POA: Diagnosis not present

## 2018-12-02 DIAGNOSIS — E119 Type 2 diabetes mellitus without complications: Secondary | ICD-10-CM | POA: Diagnosis not present

## 2018-12-02 DIAGNOSIS — E785 Hyperlipidemia, unspecified: Secondary | ICD-10-CM | POA: Diagnosis not present

## 2018-12-02 DIAGNOSIS — I10 Essential (primary) hypertension: Secondary | ICD-10-CM | POA: Diagnosis not present

## 2018-12-02 DIAGNOSIS — E43 Unspecified severe protein-calorie malnutrition: Secondary | ICD-10-CM | POA: Diagnosis not present

## 2018-12-02 DIAGNOSIS — R1310 Dysphagia, unspecified: Secondary | ICD-10-CM | POA: Diagnosis not present

## 2018-12-02 DIAGNOSIS — G20 Parkinson's disease: Secondary | ICD-10-CM | POA: Diagnosis not present

## 2018-12-05 DIAGNOSIS — G20 Parkinson's disease: Secondary | ICD-10-CM | POA: Diagnosis not present

## 2018-12-05 DIAGNOSIS — E119 Type 2 diabetes mellitus without complications: Secondary | ICD-10-CM | POA: Diagnosis not present

## 2018-12-05 DIAGNOSIS — I10 Essential (primary) hypertension: Secondary | ICD-10-CM | POA: Diagnosis not present

## 2018-12-05 DIAGNOSIS — E785 Hyperlipidemia, unspecified: Secondary | ICD-10-CM | POA: Diagnosis not present

## 2018-12-05 DIAGNOSIS — E43 Unspecified severe protein-calorie malnutrition: Secondary | ICD-10-CM | POA: Diagnosis not present

## 2018-12-05 DIAGNOSIS — R1310 Dysphagia, unspecified: Secondary | ICD-10-CM | POA: Diagnosis not present

## 2018-12-06 DIAGNOSIS — I10 Essential (primary) hypertension: Secondary | ICD-10-CM | POA: Diagnosis not present

## 2018-12-06 DIAGNOSIS — R1310 Dysphagia, unspecified: Secondary | ICD-10-CM | POA: Diagnosis not present

## 2018-12-06 DIAGNOSIS — G20 Parkinson's disease: Secondary | ICD-10-CM | POA: Diagnosis not present

## 2018-12-06 DIAGNOSIS — E43 Unspecified severe protein-calorie malnutrition: Secondary | ICD-10-CM | POA: Diagnosis not present

## 2018-12-06 DIAGNOSIS — E785 Hyperlipidemia, unspecified: Secondary | ICD-10-CM | POA: Diagnosis not present

## 2018-12-06 DIAGNOSIS — E119 Type 2 diabetes mellitus without complications: Secondary | ICD-10-CM | POA: Diagnosis not present

## 2018-12-07 DIAGNOSIS — E43 Unspecified severe protein-calorie malnutrition: Secondary | ICD-10-CM | POA: Diagnosis not present

## 2018-12-07 DIAGNOSIS — E119 Type 2 diabetes mellitus without complications: Secondary | ICD-10-CM | POA: Diagnosis not present

## 2018-12-07 DIAGNOSIS — R1310 Dysphagia, unspecified: Secondary | ICD-10-CM | POA: Diagnosis not present

## 2018-12-07 DIAGNOSIS — G20 Parkinson's disease: Secondary | ICD-10-CM | POA: Diagnosis not present

## 2018-12-07 DIAGNOSIS — E785 Hyperlipidemia, unspecified: Secondary | ICD-10-CM | POA: Diagnosis not present

## 2018-12-07 DIAGNOSIS — I10 Essential (primary) hypertension: Secondary | ICD-10-CM | POA: Diagnosis not present

## 2018-12-08 DIAGNOSIS — I10 Essential (primary) hypertension: Secondary | ICD-10-CM | POA: Diagnosis not present

## 2018-12-08 DIAGNOSIS — E43 Unspecified severe protein-calorie malnutrition: Secondary | ICD-10-CM | POA: Diagnosis not present

## 2018-12-08 DIAGNOSIS — R1310 Dysphagia, unspecified: Secondary | ICD-10-CM | POA: Diagnosis not present

## 2018-12-08 DIAGNOSIS — E785 Hyperlipidemia, unspecified: Secondary | ICD-10-CM | POA: Diagnosis not present

## 2018-12-08 DIAGNOSIS — E119 Type 2 diabetes mellitus without complications: Secondary | ICD-10-CM | POA: Diagnosis not present

## 2018-12-08 DIAGNOSIS — G20 Parkinson's disease: Secondary | ICD-10-CM | POA: Diagnosis not present

## 2018-12-09 DIAGNOSIS — I10 Essential (primary) hypertension: Secondary | ICD-10-CM | POA: Diagnosis not present

## 2018-12-09 DIAGNOSIS — R1310 Dysphagia, unspecified: Secondary | ICD-10-CM | POA: Diagnosis not present

## 2018-12-09 DIAGNOSIS — G20 Parkinson's disease: Secondary | ICD-10-CM | POA: Diagnosis not present

## 2018-12-09 DIAGNOSIS — E785 Hyperlipidemia, unspecified: Secondary | ICD-10-CM | POA: Diagnosis not present

## 2018-12-09 DIAGNOSIS — E119 Type 2 diabetes mellitus without complications: Secondary | ICD-10-CM | POA: Diagnosis not present

## 2018-12-09 DIAGNOSIS — E43 Unspecified severe protein-calorie malnutrition: Secondary | ICD-10-CM | POA: Diagnosis not present

## 2018-12-12 DIAGNOSIS — E43 Unspecified severe protein-calorie malnutrition: Secondary | ICD-10-CM | POA: Diagnosis not present

## 2018-12-12 DIAGNOSIS — E119 Type 2 diabetes mellitus without complications: Secondary | ICD-10-CM | POA: Diagnosis not present

## 2018-12-12 DIAGNOSIS — E785 Hyperlipidemia, unspecified: Secondary | ICD-10-CM | POA: Diagnosis not present

## 2018-12-12 DIAGNOSIS — G20 Parkinson's disease: Secondary | ICD-10-CM | POA: Diagnosis not present

## 2018-12-12 DIAGNOSIS — R1310 Dysphagia, unspecified: Secondary | ICD-10-CM | POA: Diagnosis not present

## 2018-12-12 DIAGNOSIS — I10 Essential (primary) hypertension: Secondary | ICD-10-CM | POA: Diagnosis not present

## 2018-12-13 DIAGNOSIS — G20 Parkinson's disease: Secondary | ICD-10-CM | POA: Diagnosis not present

## 2018-12-13 DIAGNOSIS — E43 Unspecified severe protein-calorie malnutrition: Secondary | ICD-10-CM | POA: Diagnosis not present

## 2018-12-13 DIAGNOSIS — E119 Type 2 diabetes mellitus without complications: Secondary | ICD-10-CM | POA: Diagnosis not present

## 2018-12-13 DIAGNOSIS — R1310 Dysphagia, unspecified: Secondary | ICD-10-CM | POA: Diagnosis not present

## 2018-12-13 DIAGNOSIS — I10 Essential (primary) hypertension: Secondary | ICD-10-CM | POA: Diagnosis not present

## 2018-12-13 DIAGNOSIS — E785 Hyperlipidemia, unspecified: Secondary | ICD-10-CM | POA: Diagnosis not present

## 2018-12-14 DIAGNOSIS — E119 Type 2 diabetes mellitus without complications: Secondary | ICD-10-CM | POA: Diagnosis not present

## 2018-12-14 DIAGNOSIS — G20 Parkinson's disease: Secondary | ICD-10-CM | POA: Diagnosis not present

## 2018-12-14 DIAGNOSIS — I10 Essential (primary) hypertension: Secondary | ICD-10-CM | POA: Diagnosis not present

## 2018-12-14 DIAGNOSIS — E43 Unspecified severe protein-calorie malnutrition: Secondary | ICD-10-CM | POA: Diagnosis not present

## 2018-12-14 DIAGNOSIS — E785 Hyperlipidemia, unspecified: Secondary | ICD-10-CM | POA: Diagnosis not present

## 2018-12-14 DIAGNOSIS — R1310 Dysphagia, unspecified: Secondary | ICD-10-CM | POA: Diagnosis not present

## 2018-12-15 DIAGNOSIS — E119 Type 2 diabetes mellitus without complications: Secondary | ICD-10-CM | POA: Diagnosis not present

## 2018-12-15 DIAGNOSIS — R1310 Dysphagia, unspecified: Secondary | ICD-10-CM | POA: Diagnosis not present

## 2018-12-15 DIAGNOSIS — I10 Essential (primary) hypertension: Secondary | ICD-10-CM | POA: Diagnosis not present

## 2018-12-15 DIAGNOSIS — E43 Unspecified severe protein-calorie malnutrition: Secondary | ICD-10-CM | POA: Diagnosis not present

## 2018-12-15 DIAGNOSIS — E785 Hyperlipidemia, unspecified: Secondary | ICD-10-CM | POA: Diagnosis not present

## 2018-12-15 DIAGNOSIS — G20 Parkinson's disease: Secondary | ICD-10-CM | POA: Diagnosis not present

## 2018-12-16 DIAGNOSIS — E43 Unspecified severe protein-calorie malnutrition: Secondary | ICD-10-CM | POA: Diagnosis not present

## 2018-12-16 DIAGNOSIS — E119 Type 2 diabetes mellitus without complications: Secondary | ICD-10-CM | POA: Diagnosis not present

## 2018-12-16 DIAGNOSIS — E785 Hyperlipidemia, unspecified: Secondary | ICD-10-CM | POA: Diagnosis not present

## 2018-12-16 DIAGNOSIS — R1310 Dysphagia, unspecified: Secondary | ICD-10-CM | POA: Diagnosis not present

## 2018-12-16 DIAGNOSIS — G20 Parkinson's disease: Secondary | ICD-10-CM | POA: Diagnosis not present

## 2018-12-16 DIAGNOSIS — I10 Essential (primary) hypertension: Secondary | ICD-10-CM | POA: Diagnosis not present

## 2018-12-19 DIAGNOSIS — E119 Type 2 diabetes mellitus without complications: Secondary | ICD-10-CM | POA: Diagnosis not present

## 2018-12-19 DIAGNOSIS — G20 Parkinson's disease: Secondary | ICD-10-CM | POA: Diagnosis not present

## 2018-12-19 DIAGNOSIS — E785 Hyperlipidemia, unspecified: Secondary | ICD-10-CM | POA: Diagnosis not present

## 2018-12-19 DIAGNOSIS — R1310 Dysphagia, unspecified: Secondary | ICD-10-CM | POA: Diagnosis not present

## 2018-12-19 DIAGNOSIS — E43 Unspecified severe protein-calorie malnutrition: Secondary | ICD-10-CM | POA: Diagnosis not present

## 2018-12-19 DIAGNOSIS — I10 Essential (primary) hypertension: Secondary | ICD-10-CM | POA: Diagnosis not present

## 2018-12-20 DIAGNOSIS — E119 Type 2 diabetes mellitus without complications: Secondary | ICD-10-CM | POA: Diagnosis not present

## 2018-12-20 DIAGNOSIS — I10 Essential (primary) hypertension: Secondary | ICD-10-CM | POA: Diagnosis not present

## 2018-12-20 DIAGNOSIS — E785 Hyperlipidemia, unspecified: Secondary | ICD-10-CM | POA: Diagnosis not present

## 2018-12-20 DIAGNOSIS — R1310 Dysphagia, unspecified: Secondary | ICD-10-CM | POA: Diagnosis not present

## 2018-12-20 DIAGNOSIS — E43 Unspecified severe protein-calorie malnutrition: Secondary | ICD-10-CM | POA: Diagnosis not present

## 2018-12-20 DIAGNOSIS — G20 Parkinson's disease: Secondary | ICD-10-CM | POA: Diagnosis not present

## 2018-12-21 DIAGNOSIS — E785 Hyperlipidemia, unspecified: Secondary | ICD-10-CM | POA: Diagnosis not present

## 2018-12-21 DIAGNOSIS — G20 Parkinson's disease: Secondary | ICD-10-CM | POA: Diagnosis not present

## 2018-12-21 DIAGNOSIS — E119 Type 2 diabetes mellitus without complications: Secondary | ICD-10-CM | POA: Diagnosis not present

## 2018-12-21 DIAGNOSIS — I10 Essential (primary) hypertension: Secondary | ICD-10-CM | POA: Diagnosis not present

## 2018-12-21 DIAGNOSIS — R1310 Dysphagia, unspecified: Secondary | ICD-10-CM | POA: Diagnosis not present

## 2018-12-21 DIAGNOSIS — E43 Unspecified severe protein-calorie malnutrition: Secondary | ICD-10-CM | POA: Diagnosis not present

## 2018-12-22 DIAGNOSIS — I10 Essential (primary) hypertension: Secondary | ICD-10-CM | POA: Diagnosis not present

## 2018-12-22 DIAGNOSIS — E785 Hyperlipidemia, unspecified: Secondary | ICD-10-CM | POA: Diagnosis not present

## 2018-12-22 DIAGNOSIS — G20 Parkinson's disease: Secondary | ICD-10-CM | POA: Diagnosis not present

## 2018-12-22 DIAGNOSIS — R1310 Dysphagia, unspecified: Secondary | ICD-10-CM | POA: Diagnosis not present

## 2018-12-22 DIAGNOSIS — E43 Unspecified severe protein-calorie malnutrition: Secondary | ICD-10-CM | POA: Diagnosis not present

## 2018-12-22 DIAGNOSIS — E119 Type 2 diabetes mellitus without complications: Secondary | ICD-10-CM | POA: Diagnosis not present

## 2018-12-23 DIAGNOSIS — E785 Hyperlipidemia, unspecified: Secondary | ICD-10-CM | POA: Diagnosis not present

## 2018-12-23 DIAGNOSIS — E43 Unspecified severe protein-calorie malnutrition: Secondary | ICD-10-CM | POA: Diagnosis not present

## 2018-12-23 DIAGNOSIS — R1310 Dysphagia, unspecified: Secondary | ICD-10-CM | POA: Diagnosis not present

## 2018-12-23 DIAGNOSIS — E119 Type 2 diabetes mellitus without complications: Secondary | ICD-10-CM | POA: Diagnosis not present

## 2018-12-23 DIAGNOSIS — G20 Parkinson's disease: Secondary | ICD-10-CM | POA: Diagnosis not present

## 2018-12-23 DIAGNOSIS — I10 Essential (primary) hypertension: Secondary | ICD-10-CM | POA: Diagnosis not present

## 2018-12-25 DIAGNOSIS — Z741 Need for assistance with personal care: Secondary | ICD-10-CM | POA: Diagnosis not present

## 2018-12-25 DIAGNOSIS — I452 Bifascicular block: Secondary | ICD-10-CM | POA: Diagnosis not present

## 2018-12-25 DIAGNOSIS — L89152 Pressure ulcer of sacral region, stage 2: Secondary | ICD-10-CM | POA: Diagnosis not present

## 2018-12-25 DIAGNOSIS — G20 Parkinson's disease: Secondary | ICD-10-CM | POA: Diagnosis not present

## 2018-12-25 DIAGNOSIS — G2581 Restless legs syndrome: Secondary | ICD-10-CM | POA: Diagnosis not present

## 2018-12-25 DIAGNOSIS — R32 Unspecified urinary incontinence: Secondary | ICD-10-CM | POA: Diagnosis not present

## 2018-12-25 DIAGNOSIS — E43 Unspecified severe protein-calorie malnutrition: Secondary | ICD-10-CM | POA: Diagnosis not present

## 2018-12-25 DIAGNOSIS — N400 Enlarged prostate without lower urinary tract symptoms: Secondary | ICD-10-CM | POA: Diagnosis not present

## 2018-12-25 DIAGNOSIS — R1310 Dysphagia, unspecified: Secondary | ICD-10-CM | POA: Diagnosis not present

## 2018-12-25 DIAGNOSIS — H04123 Dry eye syndrome of bilateral lacrimal glands: Secondary | ICD-10-CM | POA: Diagnosis not present

## 2018-12-25 DIAGNOSIS — E119 Type 2 diabetes mellitus without complications: Secondary | ICD-10-CM | POA: Diagnosis not present

## 2018-12-25 DIAGNOSIS — K219 Gastro-esophageal reflux disease without esophagitis: Secondary | ICD-10-CM | POA: Diagnosis not present

## 2018-12-25 DIAGNOSIS — E785 Hyperlipidemia, unspecified: Secondary | ICD-10-CM | POA: Diagnosis not present

## 2018-12-25 DIAGNOSIS — I10 Essential (primary) hypertension: Secondary | ICD-10-CM | POA: Diagnosis not present

## 2018-12-25 DIAGNOSIS — E039 Hypothyroidism, unspecified: Secondary | ICD-10-CM | POA: Diagnosis not present

## 2018-12-25 DIAGNOSIS — Z7401 Bed confinement status: Secondary | ICD-10-CM | POA: Diagnosis not present

## 2018-12-26 DIAGNOSIS — I10 Essential (primary) hypertension: Secondary | ICD-10-CM | POA: Diagnosis not present

## 2018-12-26 DIAGNOSIS — E119 Type 2 diabetes mellitus without complications: Secondary | ICD-10-CM | POA: Diagnosis not present

## 2018-12-26 DIAGNOSIS — G20 Parkinson's disease: Secondary | ICD-10-CM | POA: Diagnosis not present

## 2018-12-26 DIAGNOSIS — R1310 Dysphagia, unspecified: Secondary | ICD-10-CM | POA: Diagnosis not present

## 2018-12-26 DIAGNOSIS — E43 Unspecified severe protein-calorie malnutrition: Secondary | ICD-10-CM | POA: Diagnosis not present

## 2018-12-26 DIAGNOSIS — E785 Hyperlipidemia, unspecified: Secondary | ICD-10-CM | POA: Diagnosis not present

## 2018-12-27 DIAGNOSIS — E119 Type 2 diabetes mellitus without complications: Secondary | ICD-10-CM | POA: Diagnosis not present

## 2018-12-27 DIAGNOSIS — I10 Essential (primary) hypertension: Secondary | ICD-10-CM | POA: Diagnosis not present

## 2018-12-27 DIAGNOSIS — G20 Parkinson's disease: Secondary | ICD-10-CM | POA: Diagnosis not present

## 2018-12-27 DIAGNOSIS — E43 Unspecified severe protein-calorie malnutrition: Secondary | ICD-10-CM | POA: Diagnosis not present

## 2018-12-27 DIAGNOSIS — R1310 Dysphagia, unspecified: Secondary | ICD-10-CM | POA: Diagnosis not present

## 2018-12-27 DIAGNOSIS — E785 Hyperlipidemia, unspecified: Secondary | ICD-10-CM | POA: Diagnosis not present

## 2018-12-28 DIAGNOSIS — E785 Hyperlipidemia, unspecified: Secondary | ICD-10-CM | POA: Diagnosis not present

## 2018-12-28 DIAGNOSIS — G20 Parkinson's disease: Secondary | ICD-10-CM | POA: Diagnosis not present

## 2018-12-28 DIAGNOSIS — E119 Type 2 diabetes mellitus without complications: Secondary | ICD-10-CM | POA: Diagnosis not present

## 2018-12-28 DIAGNOSIS — R1310 Dysphagia, unspecified: Secondary | ICD-10-CM | POA: Diagnosis not present

## 2018-12-28 DIAGNOSIS — E43 Unspecified severe protein-calorie malnutrition: Secondary | ICD-10-CM | POA: Diagnosis not present

## 2018-12-28 DIAGNOSIS — I10 Essential (primary) hypertension: Secondary | ICD-10-CM | POA: Diagnosis not present

## 2018-12-29 DIAGNOSIS — G20 Parkinson's disease: Secondary | ICD-10-CM | POA: Diagnosis not present

## 2018-12-29 DIAGNOSIS — I10 Essential (primary) hypertension: Secondary | ICD-10-CM | POA: Diagnosis not present

## 2018-12-29 DIAGNOSIS — R1310 Dysphagia, unspecified: Secondary | ICD-10-CM | POA: Diagnosis not present

## 2018-12-29 DIAGNOSIS — E43 Unspecified severe protein-calorie malnutrition: Secondary | ICD-10-CM | POA: Diagnosis not present

## 2018-12-29 DIAGNOSIS — E119 Type 2 diabetes mellitus without complications: Secondary | ICD-10-CM | POA: Diagnosis not present

## 2018-12-29 DIAGNOSIS — E785 Hyperlipidemia, unspecified: Secondary | ICD-10-CM | POA: Diagnosis not present

## 2018-12-30 DIAGNOSIS — E119 Type 2 diabetes mellitus without complications: Secondary | ICD-10-CM | POA: Diagnosis not present

## 2018-12-30 DIAGNOSIS — R1310 Dysphagia, unspecified: Secondary | ICD-10-CM | POA: Diagnosis not present

## 2018-12-30 DIAGNOSIS — E43 Unspecified severe protein-calorie malnutrition: Secondary | ICD-10-CM | POA: Diagnosis not present

## 2018-12-30 DIAGNOSIS — E785 Hyperlipidemia, unspecified: Secondary | ICD-10-CM | POA: Diagnosis not present

## 2018-12-30 DIAGNOSIS — I10 Essential (primary) hypertension: Secondary | ICD-10-CM | POA: Diagnosis not present

## 2018-12-30 DIAGNOSIS — G20 Parkinson's disease: Secondary | ICD-10-CM | POA: Diagnosis not present

## 2019-01-02 DIAGNOSIS — I10 Essential (primary) hypertension: Secondary | ICD-10-CM | POA: Diagnosis not present

## 2019-01-02 DIAGNOSIS — R1310 Dysphagia, unspecified: Secondary | ICD-10-CM | POA: Diagnosis not present

## 2019-01-02 DIAGNOSIS — E119 Type 2 diabetes mellitus without complications: Secondary | ICD-10-CM | POA: Diagnosis not present

## 2019-01-02 DIAGNOSIS — E43 Unspecified severe protein-calorie malnutrition: Secondary | ICD-10-CM | POA: Diagnosis not present

## 2019-01-02 DIAGNOSIS — E785 Hyperlipidemia, unspecified: Secondary | ICD-10-CM | POA: Diagnosis not present

## 2019-01-02 DIAGNOSIS — G20 Parkinson's disease: Secondary | ICD-10-CM | POA: Diagnosis not present

## 2019-01-03 DIAGNOSIS — E43 Unspecified severe protein-calorie malnutrition: Secondary | ICD-10-CM | POA: Diagnosis not present

## 2019-01-03 DIAGNOSIS — E119 Type 2 diabetes mellitus without complications: Secondary | ICD-10-CM | POA: Diagnosis not present

## 2019-01-03 DIAGNOSIS — R1310 Dysphagia, unspecified: Secondary | ICD-10-CM | POA: Diagnosis not present

## 2019-01-03 DIAGNOSIS — G20 Parkinson's disease: Secondary | ICD-10-CM | POA: Diagnosis not present

## 2019-01-03 DIAGNOSIS — E785 Hyperlipidemia, unspecified: Secondary | ICD-10-CM | POA: Diagnosis not present

## 2019-01-03 DIAGNOSIS — I10 Essential (primary) hypertension: Secondary | ICD-10-CM | POA: Diagnosis not present

## 2019-01-04 DIAGNOSIS — R1310 Dysphagia, unspecified: Secondary | ICD-10-CM | POA: Diagnosis not present

## 2019-01-04 DIAGNOSIS — G20 Parkinson's disease: Secondary | ICD-10-CM | POA: Diagnosis not present

## 2019-01-04 DIAGNOSIS — E43 Unspecified severe protein-calorie malnutrition: Secondary | ICD-10-CM | POA: Diagnosis not present

## 2019-01-04 DIAGNOSIS — I10 Essential (primary) hypertension: Secondary | ICD-10-CM | POA: Diagnosis not present

## 2019-01-04 DIAGNOSIS — E119 Type 2 diabetes mellitus without complications: Secondary | ICD-10-CM | POA: Diagnosis not present

## 2019-01-04 DIAGNOSIS — E785 Hyperlipidemia, unspecified: Secondary | ICD-10-CM | POA: Diagnosis not present

## 2019-01-05 DIAGNOSIS — R1310 Dysphagia, unspecified: Secondary | ICD-10-CM | POA: Diagnosis not present

## 2019-01-05 DIAGNOSIS — I10 Essential (primary) hypertension: Secondary | ICD-10-CM | POA: Diagnosis not present

## 2019-01-05 DIAGNOSIS — G20 Parkinson's disease: Secondary | ICD-10-CM | POA: Diagnosis not present

## 2019-01-05 DIAGNOSIS — E119 Type 2 diabetes mellitus without complications: Secondary | ICD-10-CM | POA: Diagnosis not present

## 2019-01-05 DIAGNOSIS — E43 Unspecified severe protein-calorie malnutrition: Secondary | ICD-10-CM | POA: Diagnosis not present

## 2019-01-05 DIAGNOSIS — E785 Hyperlipidemia, unspecified: Secondary | ICD-10-CM | POA: Diagnosis not present

## 2019-01-06 DIAGNOSIS — R1310 Dysphagia, unspecified: Secondary | ICD-10-CM | POA: Diagnosis not present

## 2019-01-06 DIAGNOSIS — I10 Essential (primary) hypertension: Secondary | ICD-10-CM | POA: Diagnosis not present

## 2019-01-06 DIAGNOSIS — G20 Parkinson's disease: Secondary | ICD-10-CM | POA: Diagnosis not present

## 2019-01-06 DIAGNOSIS — E119 Type 2 diabetes mellitus without complications: Secondary | ICD-10-CM | POA: Diagnosis not present

## 2019-01-06 DIAGNOSIS — E785 Hyperlipidemia, unspecified: Secondary | ICD-10-CM | POA: Diagnosis not present

## 2019-01-06 DIAGNOSIS — E43 Unspecified severe protein-calorie malnutrition: Secondary | ICD-10-CM | POA: Diagnosis not present

## 2019-01-09 DIAGNOSIS — E785 Hyperlipidemia, unspecified: Secondary | ICD-10-CM | POA: Diagnosis not present

## 2019-01-09 DIAGNOSIS — E43 Unspecified severe protein-calorie malnutrition: Secondary | ICD-10-CM | POA: Diagnosis not present

## 2019-01-09 DIAGNOSIS — E119 Type 2 diabetes mellitus without complications: Secondary | ICD-10-CM | POA: Diagnosis not present

## 2019-01-09 DIAGNOSIS — G20 Parkinson's disease: Secondary | ICD-10-CM | POA: Diagnosis not present

## 2019-01-09 DIAGNOSIS — I10 Essential (primary) hypertension: Secondary | ICD-10-CM | POA: Diagnosis not present

## 2019-01-09 DIAGNOSIS — R1310 Dysphagia, unspecified: Secondary | ICD-10-CM | POA: Diagnosis not present

## 2019-01-10 DIAGNOSIS — I10 Essential (primary) hypertension: Secondary | ICD-10-CM | POA: Diagnosis not present

## 2019-01-10 DIAGNOSIS — E785 Hyperlipidemia, unspecified: Secondary | ICD-10-CM | POA: Diagnosis not present

## 2019-01-10 DIAGNOSIS — E43 Unspecified severe protein-calorie malnutrition: Secondary | ICD-10-CM | POA: Diagnosis not present

## 2019-01-10 DIAGNOSIS — G20 Parkinson's disease: Secondary | ICD-10-CM | POA: Diagnosis not present

## 2019-01-10 DIAGNOSIS — R1310 Dysphagia, unspecified: Secondary | ICD-10-CM | POA: Diagnosis not present

## 2019-01-10 DIAGNOSIS — E119 Type 2 diabetes mellitus without complications: Secondary | ICD-10-CM | POA: Diagnosis not present

## 2019-01-11 DIAGNOSIS — E119 Type 2 diabetes mellitus without complications: Secondary | ICD-10-CM | POA: Diagnosis not present

## 2019-01-11 DIAGNOSIS — R1310 Dysphagia, unspecified: Secondary | ICD-10-CM | POA: Diagnosis not present

## 2019-01-11 DIAGNOSIS — I10 Essential (primary) hypertension: Secondary | ICD-10-CM | POA: Diagnosis not present

## 2019-01-11 DIAGNOSIS — G20 Parkinson's disease: Secondary | ICD-10-CM | POA: Diagnosis not present

## 2019-01-11 DIAGNOSIS — E785 Hyperlipidemia, unspecified: Secondary | ICD-10-CM | POA: Diagnosis not present

## 2019-01-11 DIAGNOSIS — E43 Unspecified severe protein-calorie malnutrition: Secondary | ICD-10-CM | POA: Diagnosis not present

## 2019-01-12 DIAGNOSIS — G20 Parkinson's disease: Secondary | ICD-10-CM | POA: Diagnosis not present

## 2019-01-12 DIAGNOSIS — E785 Hyperlipidemia, unspecified: Secondary | ICD-10-CM | POA: Diagnosis not present

## 2019-01-12 DIAGNOSIS — E43 Unspecified severe protein-calorie malnutrition: Secondary | ICD-10-CM | POA: Diagnosis not present

## 2019-01-12 DIAGNOSIS — R1310 Dysphagia, unspecified: Secondary | ICD-10-CM | POA: Diagnosis not present

## 2019-01-12 DIAGNOSIS — E119 Type 2 diabetes mellitus without complications: Secondary | ICD-10-CM | POA: Diagnosis not present

## 2019-01-12 DIAGNOSIS — I10 Essential (primary) hypertension: Secondary | ICD-10-CM | POA: Diagnosis not present

## 2019-01-13 DIAGNOSIS — E119 Type 2 diabetes mellitus without complications: Secondary | ICD-10-CM | POA: Diagnosis not present

## 2019-01-13 DIAGNOSIS — R1310 Dysphagia, unspecified: Secondary | ICD-10-CM | POA: Diagnosis not present

## 2019-01-13 DIAGNOSIS — I10 Essential (primary) hypertension: Secondary | ICD-10-CM | POA: Diagnosis not present

## 2019-01-13 DIAGNOSIS — E43 Unspecified severe protein-calorie malnutrition: Secondary | ICD-10-CM | POA: Diagnosis not present

## 2019-01-13 DIAGNOSIS — E785 Hyperlipidemia, unspecified: Secondary | ICD-10-CM | POA: Diagnosis not present

## 2019-01-13 DIAGNOSIS — G20 Parkinson's disease: Secondary | ICD-10-CM | POA: Diagnosis not present

## 2019-01-14 DIAGNOSIS — R1310 Dysphagia, unspecified: Secondary | ICD-10-CM | POA: Diagnosis not present

## 2019-01-14 DIAGNOSIS — E43 Unspecified severe protein-calorie malnutrition: Secondary | ICD-10-CM | POA: Diagnosis not present

## 2019-01-14 DIAGNOSIS — I10 Essential (primary) hypertension: Secondary | ICD-10-CM | POA: Diagnosis not present

## 2019-01-14 DIAGNOSIS — E785 Hyperlipidemia, unspecified: Secondary | ICD-10-CM | POA: Diagnosis not present

## 2019-01-14 DIAGNOSIS — G20 Parkinson's disease: Secondary | ICD-10-CM | POA: Diagnosis not present

## 2019-01-14 DIAGNOSIS — E119 Type 2 diabetes mellitus without complications: Secondary | ICD-10-CM | POA: Diagnosis not present

## 2019-01-16 ENCOUNTER — Encounter: Payer: Self-pay | Admitting: Internal Medicine

## 2019-01-16 DIAGNOSIS — E43 Unspecified severe protein-calorie malnutrition: Secondary | ICD-10-CM | POA: Diagnosis not present

## 2019-01-16 DIAGNOSIS — E785 Hyperlipidemia, unspecified: Secondary | ICD-10-CM | POA: Diagnosis not present

## 2019-01-16 DIAGNOSIS — R1310 Dysphagia, unspecified: Secondary | ICD-10-CM | POA: Diagnosis not present

## 2019-01-16 DIAGNOSIS — E119 Type 2 diabetes mellitus without complications: Secondary | ICD-10-CM | POA: Diagnosis not present

## 2019-01-16 DIAGNOSIS — G20 Parkinson's disease: Secondary | ICD-10-CM | POA: Diagnosis not present

## 2019-01-16 DIAGNOSIS — I10 Essential (primary) hypertension: Secondary | ICD-10-CM | POA: Diagnosis not present

## 2019-01-21 ENCOUNTER — Other Ambulatory Visit: Payer: Self-pay | Admitting: Internal Medicine

## 2019-01-24 DEATH — deceased

## 2019-02-19 IMAGING — MR MR HEAD W/O CM
10 of 15 series · 33 of 48 positions shown · non-contrast
Comparison: CT HEAD November 21, 2017 and MRI head November 21, 2013.

CLINICAL DATA: Syncopal episode, found down in bathroom. Altered
level of consciousness. History of Parkinson's disease, recurrent
falls, hypertension and diabetes.

EXAM:
MRI HEAD WITHOUT CONTRAST
TECHNIQUE: Multiplanar, multiecho pulse sequences of the brain and surrounding
structures were obtained without intravenous contrast.

[Series 5: ax dwi_tracew · axial · 3.0mm · 1.50mm/px · z∈[-33,+102]mm · 6 of 96 slices shown]
[im 1/96]
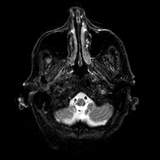
[im 20/96]
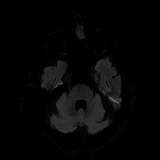
[im 39/96]
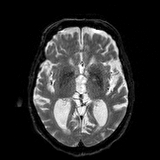
[im 58/96]
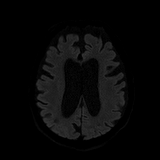
[im 77/96]
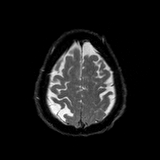
[im 96/96]
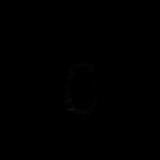

[Series 6: ax dwi_adc · axial · 3.0mm · 1.50mm/px · z∈[-33,+102]mm · 3 of 48 slices shown]
[im 1/48]
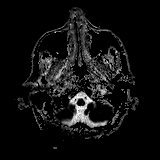
[im 24/48]
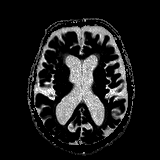
[im 48/48]
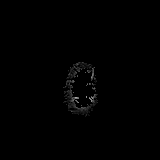

[Series 7: cor dwi_tracew · coronal · 5.0mm · 1.44mm/px · 5 of 76 slices shown]
[im 1/76]
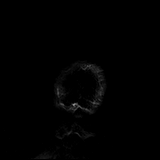
[im 19/76]
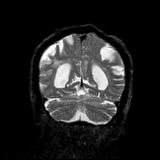
[im 38/76]
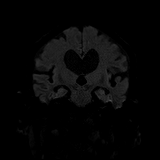
[im 57/76]
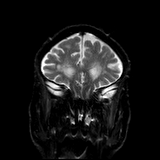
[im 76/76]
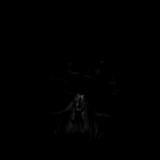

[Series 8: cor dwi_adc · coronal · 5.0mm · 1.44mm/px · 3 of 38 slices shown]
[im 1/38]
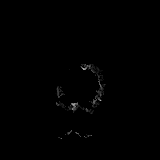
[im 19/38]
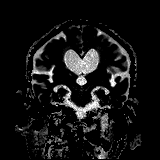
[im 38/38]
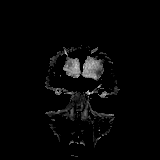

[Series 9: T1 · sagittal · 5.0mm · 0.75mm/px · 2 of 23 slices shown]
[im 1/23]
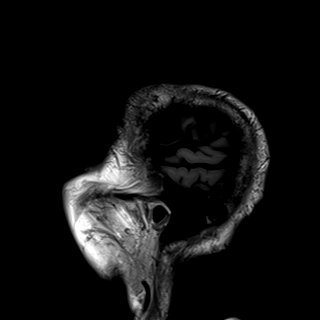
[im 23/23]
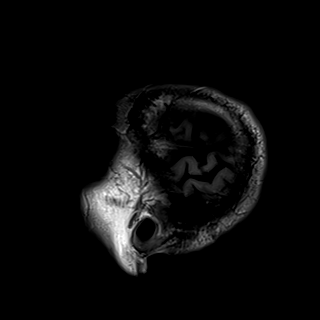

[Series 10: T2 · axial · 5.0mm · 0.72mm/px · z∈[-45,+99]mm · 2 of 26 slices shown (1 of 2)]
[im 1/26]
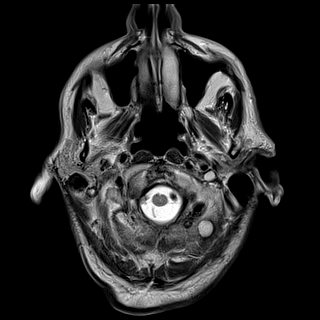
[im 26/26]
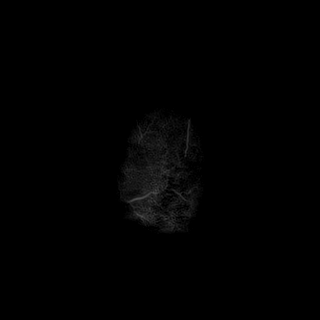

[Series 11: FLAIR · axial · 5.0mm · 0.45mm/px · z∈[-42,+102]mm · 2 of 26 slices shown]
[im 1/26]
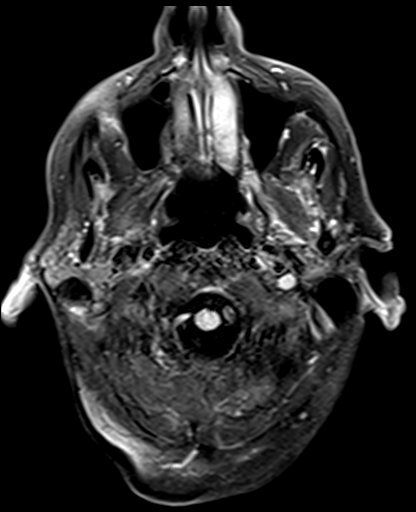
[im 26/26]
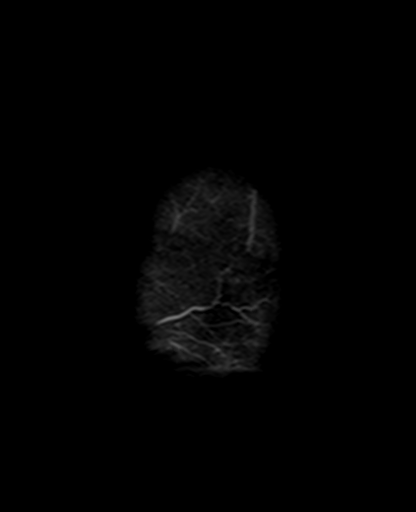

[Series 12: swi_images · axial · 3.0mm · 0.90mm/px · z∈[-49,+121]mm · 4 of 60 slices shown]
[im 1/60]
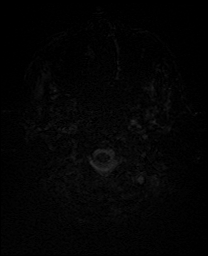
[im 20/60]
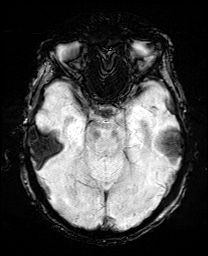
[im 40/60]
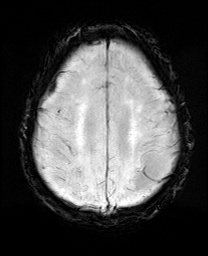
[im 60/60]
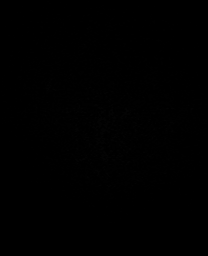

[Series 13: mip_images(sw) · axial · 24.0mm · 0.90mm/px · z∈[-39,+111]mm · 4 of 53 slices shown]
[im 1/53]
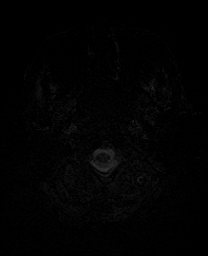
[im 18/53]
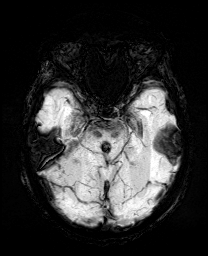
[im 35/53]
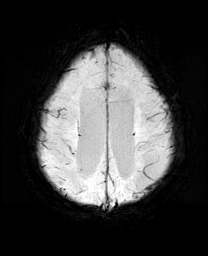
[im 53/53]
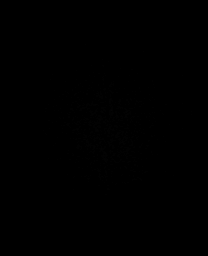

[Series 15: T2 · coronal · 5.0mm · 0.34mm/px · 2 of 32 slices shown (2 of 2)]
[im 1/32]
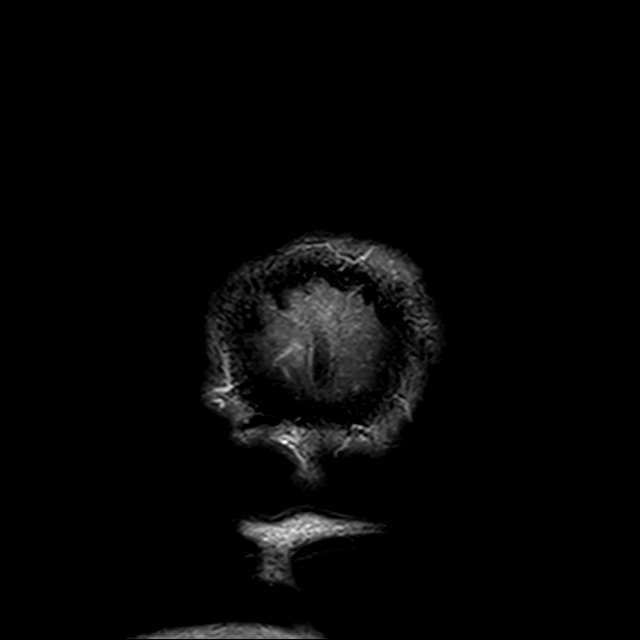
[im 32/32]
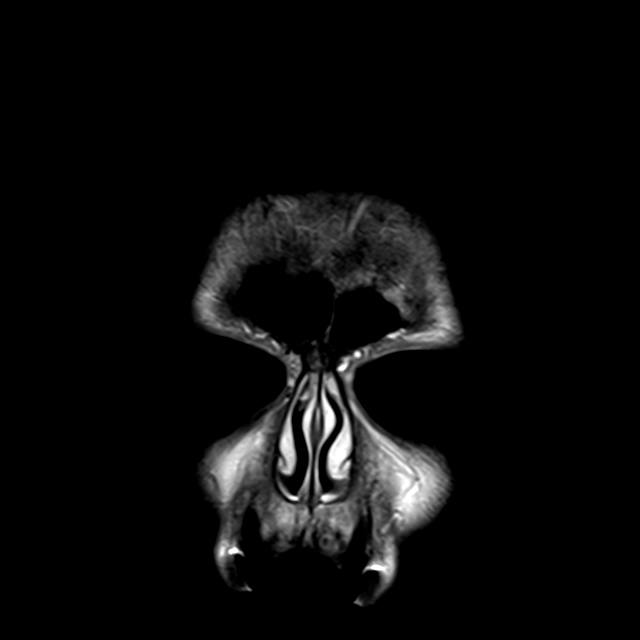

[33 of 48 positions shown; findings below may reference images not displayed]

FINDINGS: INTRACRANIAL CONTENTS: No reduced diffusion to suggest acute
ischemia. No susceptibility artifact to suggest hemorrhage. Moderate
to severe ventriculomegaly with narrowed callosum angle and sulcal
effacement at the convexities. Symmetric basal ganglia
mineralization. Susceptibility artifact within bilateral parietal
and LEFT occipital lobe encephalomalacia. Old RIGHT thalamus lacunar
infarct. Patchy supratentorial white matter FLAIR T2
hyperintensities exclusive aforementioned abnormality. No midline
shift. Similar prominent bifrontal extra-axial spaces without
extra-axial fluid collection.

VASCULAR: Normal major intracranial vascular flow voids present at
skull base.

SKULL AND UPPER CERVICAL SPINE: No abnormal sellar expansion. No
suspicious calvarial bone marrow signal. Craniocervical junction
maintained.

SINUSES/ORBITS: The mastoid air-cells and included paranasal sinuses
are well-aerated.The included ocular globes and orbital contents are
non-suspicious. Status post bilateral ocular lens implants.

OTHER: None.
IMPRESSION: 1. No acute intracranial process.
2. Old small biparietal and LEFT occipital lobe infarcts, possible
old PRES or TBI.
3. Moderate to severe parenchymal brain volume loss and imaging
findings of normal pressure hydrocephalus.
4. Mild chronic small vessel ischemic changes. Old RIGHT thalamus
lacunar infarct.

## 2019-05-01 IMAGING — CR DG CHEST 2V
2 series · 2 of 2 positions shown · non-contrast
Comparison: 01/03/2018

CLINICAL DATA: Short of breath

EXAM:
CHEST - 2 VIEW

[w chest lat]
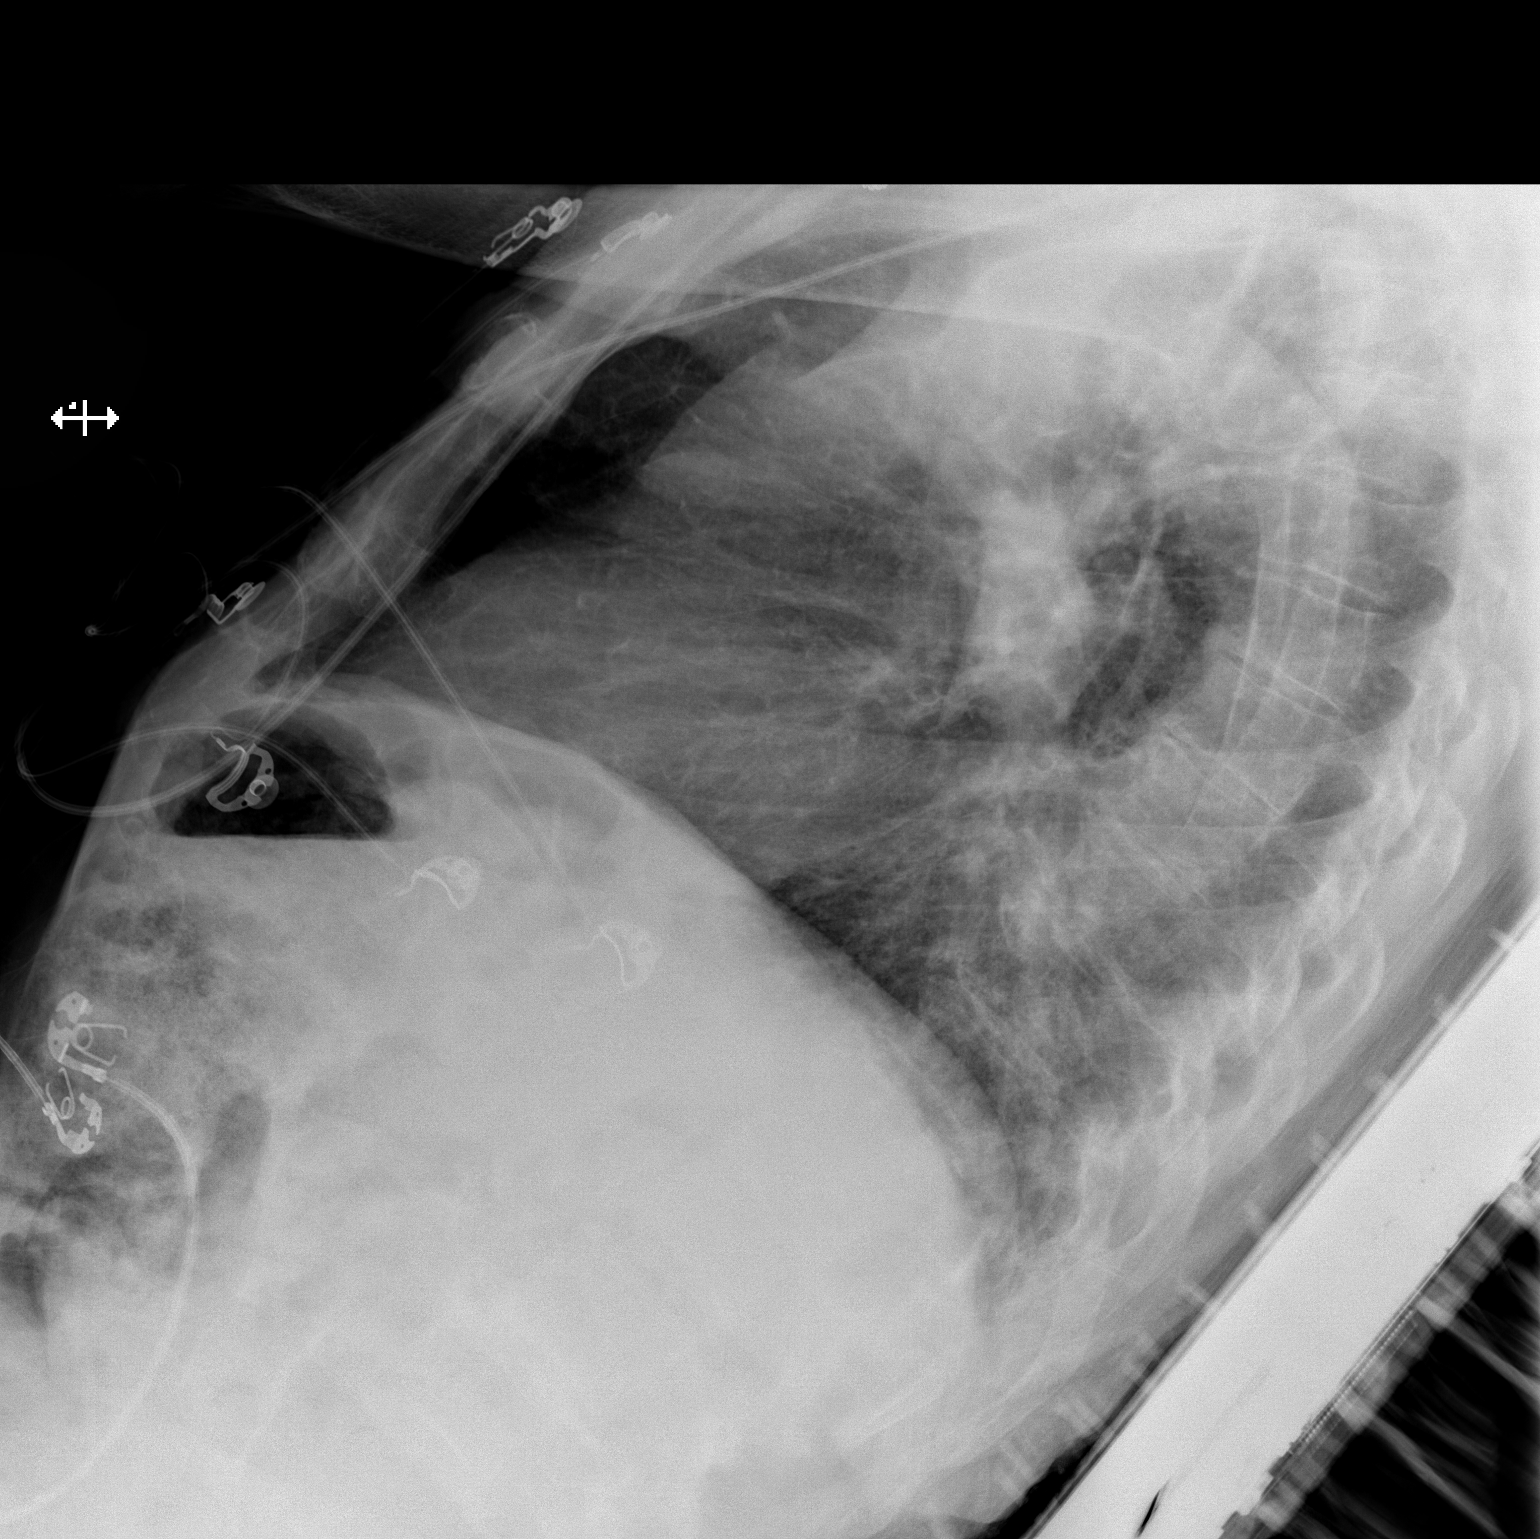

[x chest ap]
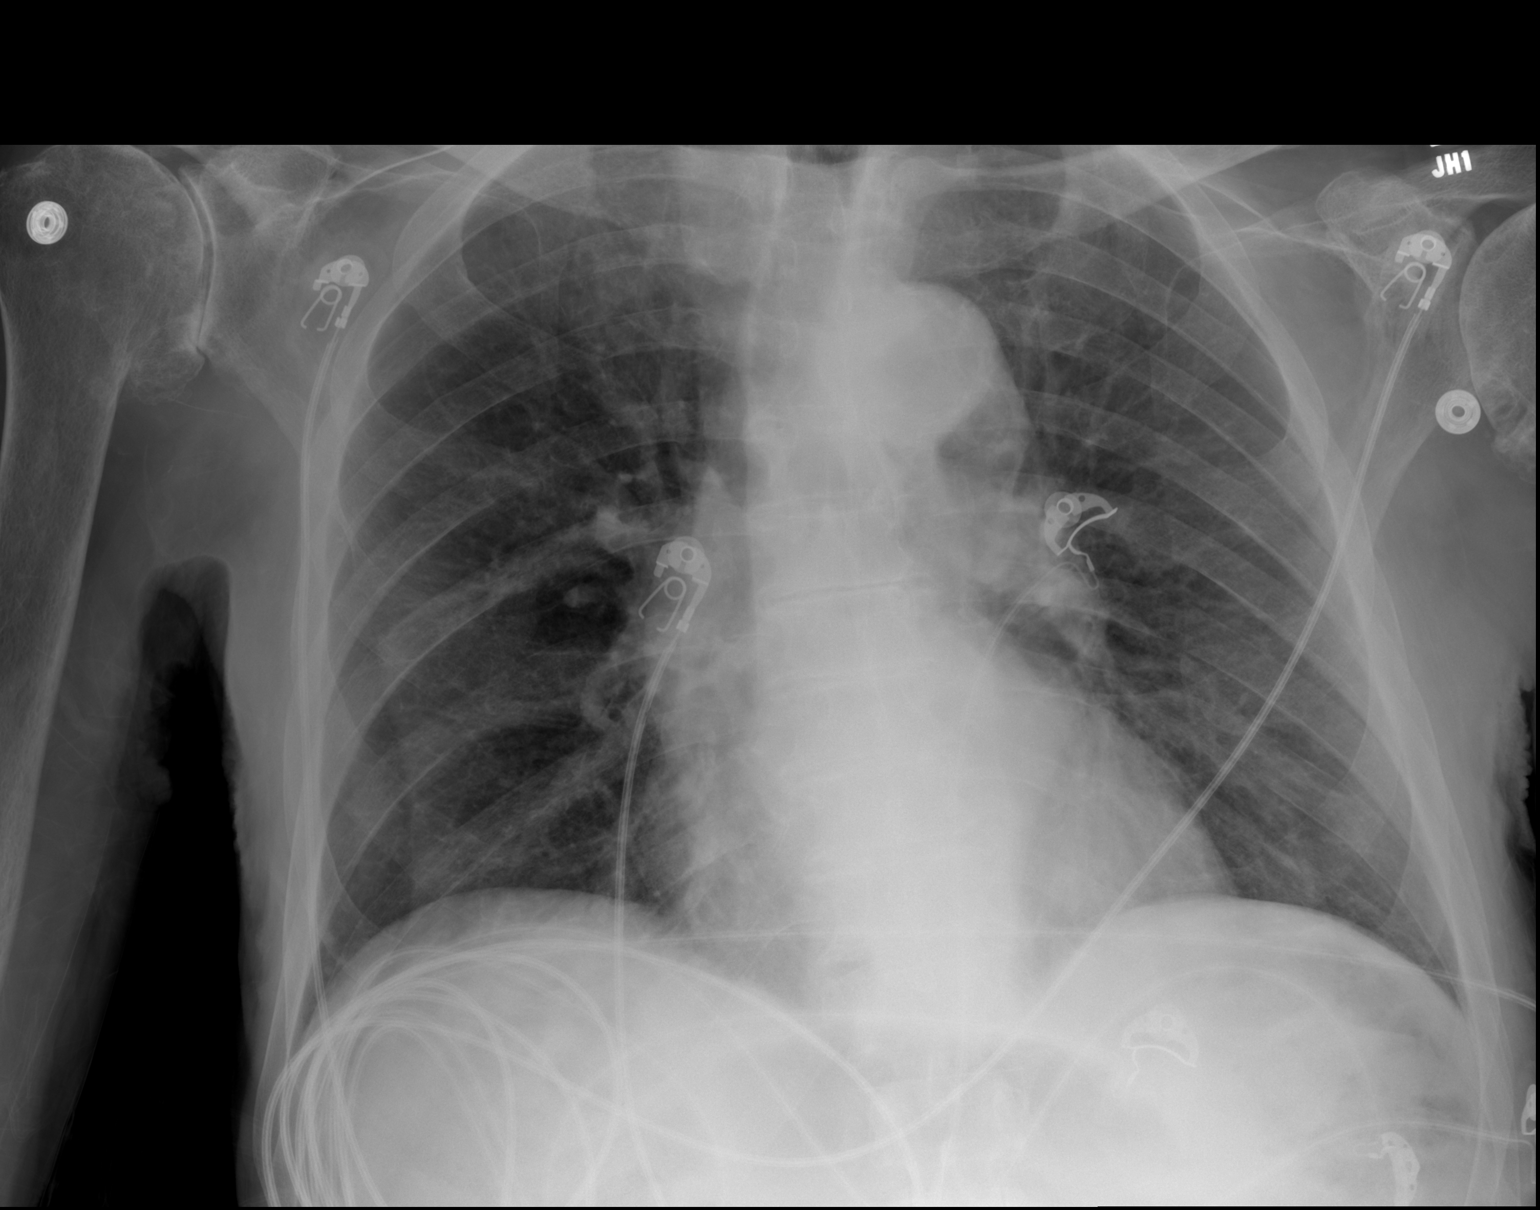

[2 of 2 positions shown; findings below may reference images not displayed]

FINDINGS: The heart size and mediastinal contours are within normal limits.
Both lungs are clear. The visualized skeletal structures are
unremarkable.
IMPRESSION: No active cardiopulmonary disease.

## 2019-06-05 IMAGING — CR DG CHEST 2V
2 series · 2 of 2 positions shown · non-contrast
Comparison: 02/01/2018

CLINICAL DATA: Productive cough and history of recent aspiration
pneumonia

EXAM:
CHEST - 2 VIEW

[w chest pa]
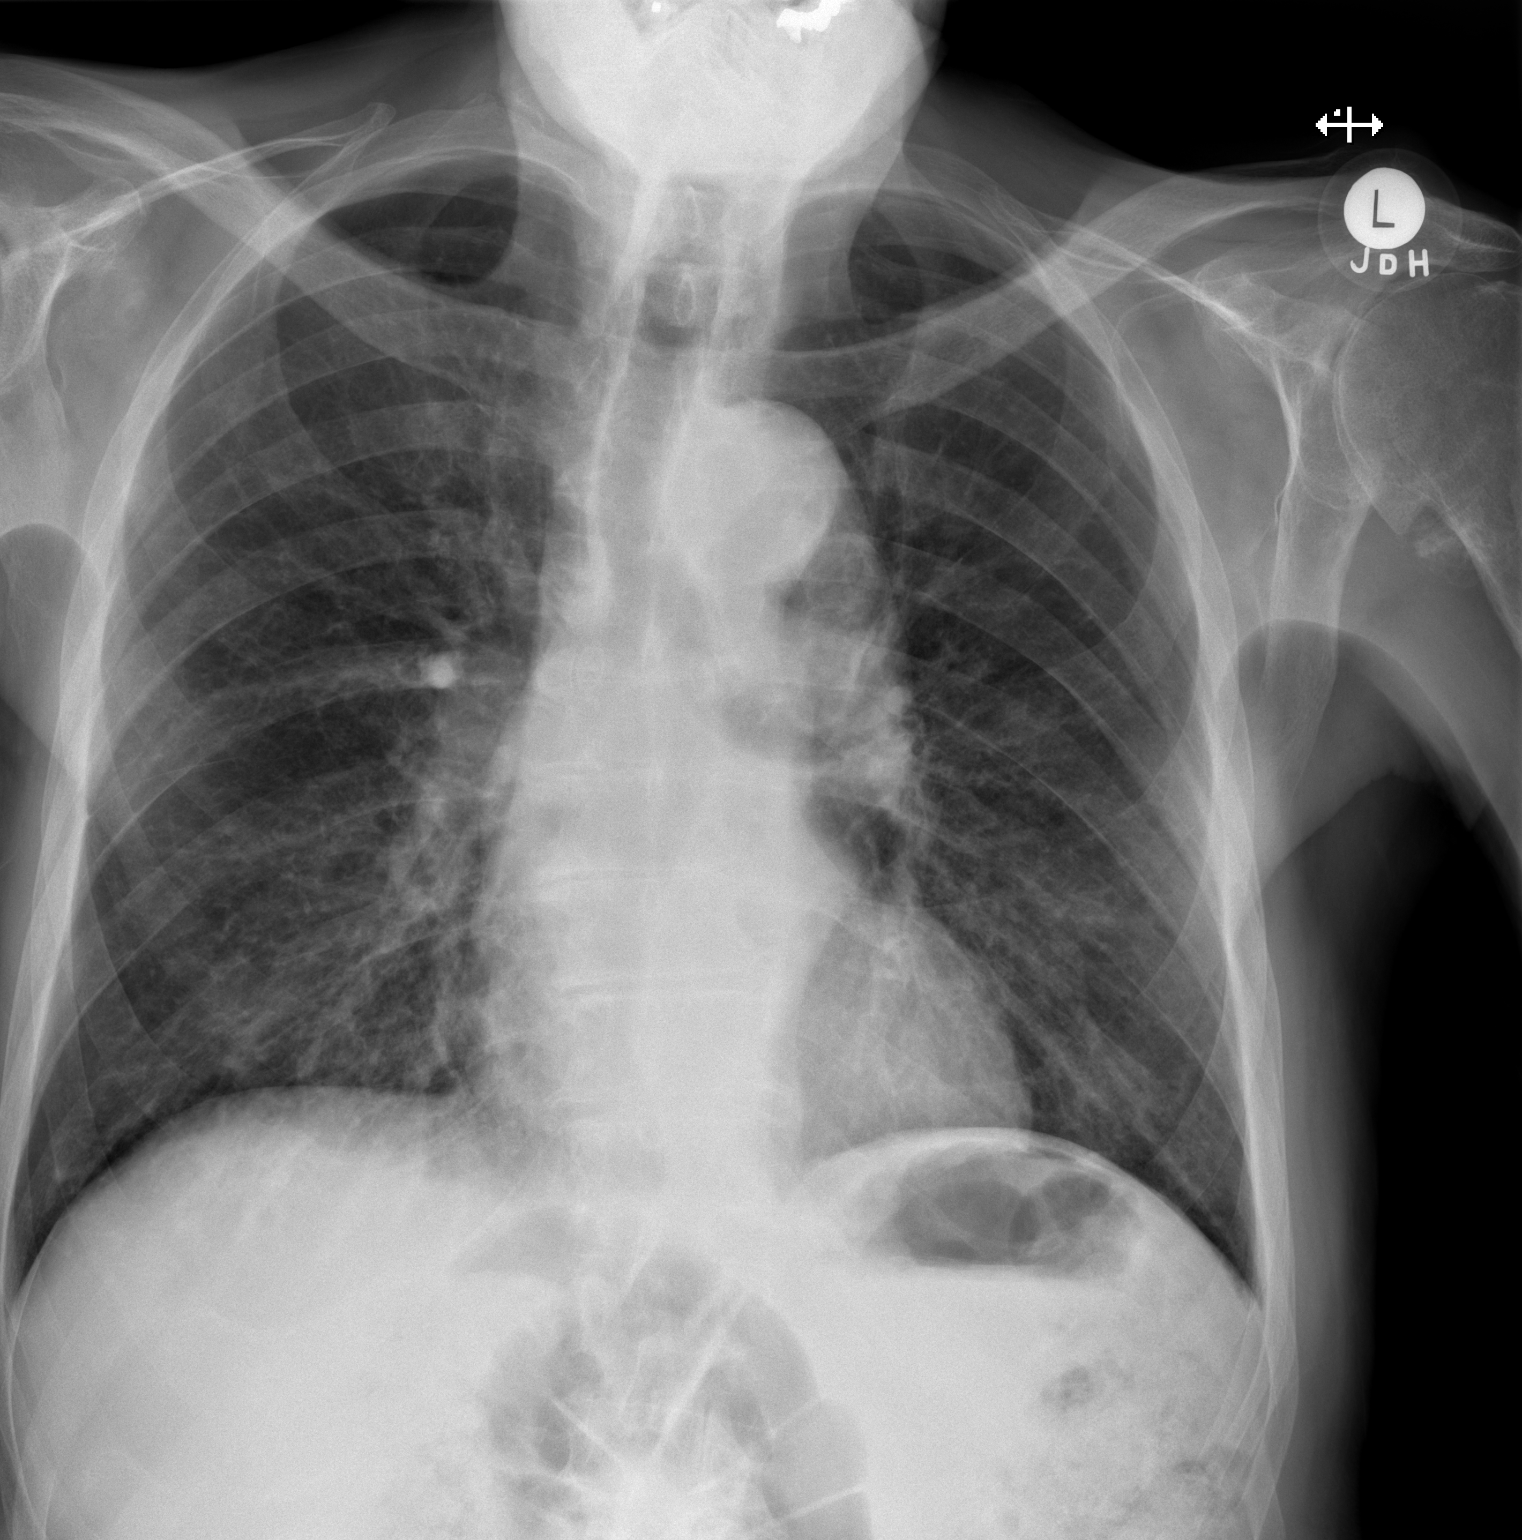

[w chest lat]
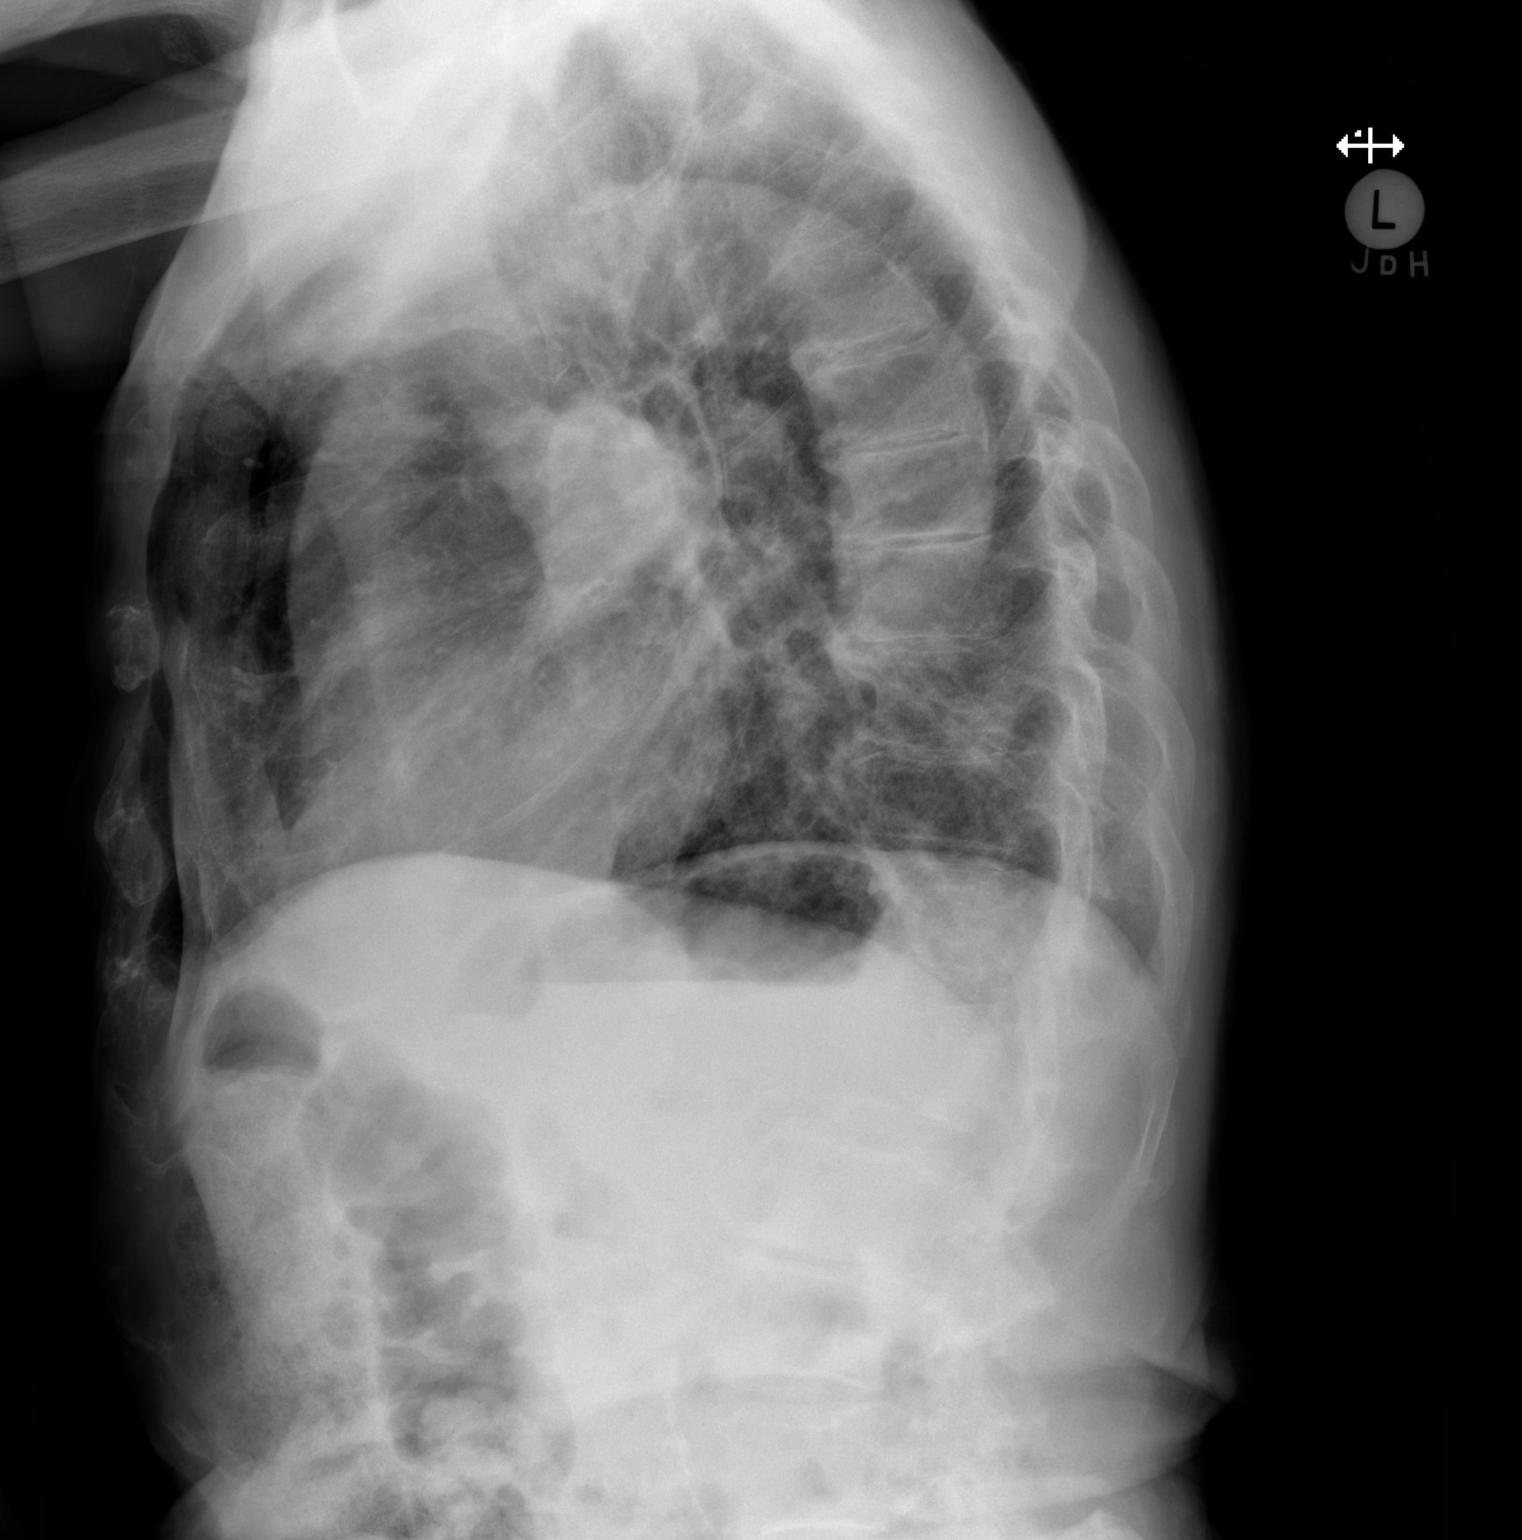

[2 of 2 positions shown; findings below may reference images not displayed]

FINDINGS: Cardiac shadows within normal limits. Aortic calcifications are
again noted and stable. The lungs are well aerated bilaterally
without focal infiltrate or sizable effusion. No acute bony
abnormality is noted.
IMPRESSION: No acute abnormality noted.
